# Patient Record
Sex: Female | Born: 1935 | Race: White | Hispanic: No | Smoking: Never smoker
Health system: Southern US, Community
[De-identification: ages and names within clinical notes are randomized; demographics above are authoritative.]

## PROBLEM LIST (undated history)

## (undated) DIAGNOSIS — G629 Polyneuropathy, unspecified: Secondary | ICD-10-CM

## (undated) DIAGNOSIS — J302 Other seasonal allergic rhinitis: Secondary | ICD-10-CM

## (undated) DIAGNOSIS — I10 Essential (primary) hypertension: Secondary | ICD-10-CM

## (undated) DIAGNOSIS — M5136 Other intervertebral disc degeneration, lumbar region: Secondary | ICD-10-CM

## (undated) DIAGNOSIS — R351 Nocturia: Secondary | ICD-10-CM

## (undated) DIAGNOSIS — N952 Postmenopausal atrophic vaginitis: Secondary | ICD-10-CM

## (undated) DIAGNOSIS — N816 Rectocele: Secondary | ICD-10-CM

## (undated) DIAGNOSIS — E039 Hypothyroidism, unspecified: Secondary | ICD-10-CM

## (undated) DIAGNOSIS — Z87898 Personal history of other specified conditions: Secondary | ICD-10-CM

## (undated) DIAGNOSIS — M4800 Spinal stenosis, site unspecified: Secondary | ICD-10-CM

## (undated) DIAGNOSIS — C73 Malignant neoplasm of thyroid gland: Secondary | ICD-10-CM

## (undated) DIAGNOSIS — N3941 Urge incontinence: Secondary | ICD-10-CM

## (undated) DIAGNOSIS — I429 Cardiomyopathy, unspecified: Secondary | ICD-10-CM

## (undated) DIAGNOSIS — K5900 Constipation, unspecified: Secondary | ICD-10-CM

## (undated) DIAGNOSIS — M549 Dorsalgia, unspecified: Secondary | ICD-10-CM

## (undated) DIAGNOSIS — K589 Irritable bowel syndrome without diarrhea: Secondary | ICD-10-CM

## (undated) DIAGNOSIS — M51369 Other intervertebral disc degeneration, lumbar region without mention of lumbar back pain or lower extremity pain: Secondary | ICD-10-CM

## (undated) DIAGNOSIS — J45909 Unspecified asthma, uncomplicated: Secondary | ICD-10-CM

## (undated) DIAGNOSIS — R079 Chest pain, unspecified: Secondary | ICD-10-CM

## (undated) DIAGNOSIS — M519 Unspecified thoracic, thoracolumbar and lumbosacral intervertebral disc disorder: Secondary | ICD-10-CM

## (undated) DIAGNOSIS — K219 Gastro-esophageal reflux disease without esophagitis: Secondary | ICD-10-CM

## (undated) DIAGNOSIS — IMO0001 Reserved for inherently not codable concepts without codable children: Secondary | ICD-10-CM

## (undated) DIAGNOSIS — M1990 Unspecified osteoarthritis, unspecified site: Secondary | ICD-10-CM

## (undated) DIAGNOSIS — N842 Polyp of vagina: Secondary | ICD-10-CM

## (undated) HISTORY — PX: THYROID SURGERY: SHX805

## (undated) HISTORY — DX: Urge incontinence: N39.41

## (undated) HISTORY — PX: BREAST BIOPSY: SHX20

## (undated) HISTORY — DX: Irritable bowel syndrome, unspecified: K58.9

## (undated) HISTORY — DX: Constipation, unspecified: K59.00

## (undated) HISTORY — PX: OTHER SURGICAL HISTORY: SHX169

## (undated) HISTORY — PX: HEMORROIDECTOMY: SUR656

## (undated) HISTORY — PX: PITUITARY SURGERY: SHX203

## (undated) HISTORY — PX: JOINT REPLACEMENT: SHX530

## (undated) HISTORY — PX: DILATION AND CURETTAGE OF UTERUS: SHX78

## (undated) HISTORY — PX: PAROTIDECTOMY: SUR1003

## (undated) HISTORY — DX: Other seasonal allergic rhinitis: J30.2

## (undated) HISTORY — PX: THYROIDECTOMY: SHX17

## (undated) HISTORY — DX: Postmenopausal atrophic vaginitis: N95.2

## (undated) HISTORY — DX: Rectocele: N81.6

## (undated) HISTORY — DX: Nocturia: R35.1

## (undated) HISTORY — DX: Polyp of vagina: N84.2

## (undated) HISTORY — DX: Malignant neoplasm of thyroid gland: C73

---

## 2004-02-20 ENCOUNTER — Other Ambulatory Visit: Payer: Self-pay

## 2004-07-08 ENCOUNTER — Ambulatory Visit: Payer: Self-pay | Admitting: Oncology

## 2004-07-28 ENCOUNTER — Ambulatory Visit: Payer: Self-pay | Admitting: Oncology

## 2004-11-05 ENCOUNTER — Ambulatory Visit: Payer: Self-pay | Admitting: Oncology

## 2004-11-19 ENCOUNTER — Ambulatory Visit: Payer: Self-pay | Admitting: Internal Medicine

## 2004-11-25 ENCOUNTER — Ambulatory Visit: Payer: Self-pay | Admitting: Oncology

## 2005-03-11 ENCOUNTER — Ambulatory Visit: Payer: Self-pay | Admitting: Oncology

## 2005-03-27 ENCOUNTER — Ambulatory Visit: Payer: Self-pay | Admitting: Oncology

## 2005-04-07 ENCOUNTER — Ambulatory Visit: Payer: Self-pay | Admitting: Unknown Physician Specialty

## 2005-07-07 ENCOUNTER — Ambulatory Visit: Payer: Self-pay | Admitting: Oncology

## 2005-07-28 ENCOUNTER — Ambulatory Visit: Payer: Self-pay | Admitting: Oncology

## 2005-11-04 ENCOUNTER — Ambulatory Visit: Payer: Self-pay | Admitting: Oncology

## 2005-11-22 ENCOUNTER — Ambulatory Visit: Payer: Self-pay | Admitting: Internal Medicine

## 2005-11-25 ENCOUNTER — Ambulatory Visit: Payer: Self-pay | Admitting: Oncology

## 2006-03-04 ENCOUNTER — Ambulatory Visit: Payer: Self-pay | Admitting: Oncology

## 2006-03-27 ENCOUNTER — Ambulatory Visit: Payer: Self-pay | Admitting: Oncology

## 2006-07-15 ENCOUNTER — Ambulatory Visit: Payer: Self-pay | Admitting: Oncology

## 2006-07-28 ENCOUNTER — Ambulatory Visit: Payer: Self-pay | Admitting: Oncology

## 2006-10-06 ENCOUNTER — Other Ambulatory Visit: Payer: Self-pay

## 2006-10-06 ENCOUNTER — Ambulatory Visit: Payer: Self-pay | Admitting: Unknown Physician Specialty

## 2006-10-07 ENCOUNTER — Ambulatory Visit: Payer: Self-pay | Admitting: Unknown Physician Specialty

## 2006-11-23 ENCOUNTER — Ambulatory Visit: Payer: Self-pay | Admitting: Internal Medicine

## 2006-12-27 ENCOUNTER — Ambulatory Visit: Payer: Self-pay | Admitting: Oncology

## 2007-01-11 ENCOUNTER — Ambulatory Visit: Payer: Self-pay | Admitting: Oncology

## 2007-01-26 ENCOUNTER — Ambulatory Visit: Payer: Self-pay | Admitting: Oncology

## 2007-05-11 ENCOUNTER — Ambulatory Visit: Payer: Self-pay | Admitting: Internal Medicine

## 2007-06-28 ENCOUNTER — Ambulatory Visit: Payer: Self-pay | Admitting: Oncology

## 2007-07-13 ENCOUNTER — Ambulatory Visit: Payer: Self-pay | Admitting: Oncology

## 2007-07-29 ENCOUNTER — Ambulatory Visit: Payer: Self-pay | Admitting: Oncology

## 2007-10-29 ENCOUNTER — Ambulatory Visit: Payer: Self-pay | Admitting: Oncology

## 2007-11-13 ENCOUNTER — Ambulatory Visit: Payer: Self-pay | Admitting: Internal Medicine

## 2007-11-23 ENCOUNTER — Ambulatory Visit: Payer: Self-pay | Admitting: Internal Medicine

## 2007-12-05 ENCOUNTER — Ambulatory Visit: Payer: Self-pay | Admitting: Internal Medicine

## 2007-12-27 ENCOUNTER — Ambulatory Visit: Payer: Self-pay | Admitting: Oncology

## 2008-01-15 ENCOUNTER — Ambulatory Visit: Payer: Self-pay | Admitting: Oncology

## 2008-01-26 ENCOUNTER — Ambulatory Visit: Payer: Self-pay | Admitting: Oncology

## 2008-01-28 ENCOUNTER — Ambulatory Visit: Payer: Self-pay | Admitting: Family Medicine

## 2008-06-26 ENCOUNTER — Ambulatory Visit: Payer: Self-pay | Admitting: Unknown Physician Specialty

## 2008-06-27 ENCOUNTER — Ambulatory Visit: Payer: Self-pay | Admitting: Oncology

## 2008-07-22 ENCOUNTER — Ambulatory Visit: Payer: Self-pay | Admitting: Oncology

## 2008-12-09 ENCOUNTER — Ambulatory Visit: Payer: Self-pay | Admitting: Internal Medicine

## 2008-12-26 ENCOUNTER — Ambulatory Visit: Payer: Self-pay | Admitting: Oncology

## 2009-01-20 ENCOUNTER — Ambulatory Visit: Payer: Self-pay | Admitting: Oncology

## 2009-01-25 ENCOUNTER — Ambulatory Visit: Payer: Self-pay | Admitting: Oncology

## 2009-02-20 ENCOUNTER — Ambulatory Visit: Payer: Self-pay | Admitting: Internal Medicine

## 2009-06-27 ENCOUNTER — Ambulatory Visit: Payer: Self-pay | Admitting: Oncology

## 2009-07-21 ENCOUNTER — Ambulatory Visit: Payer: Self-pay | Admitting: Oncology

## 2009-07-28 ENCOUNTER — Ambulatory Visit: Payer: Self-pay | Admitting: Oncology

## 2009-12-10 ENCOUNTER — Ambulatory Visit: Payer: Self-pay | Admitting: Internal Medicine

## 2010-01-25 ENCOUNTER — Ambulatory Visit: Payer: Self-pay | Admitting: Oncology

## 2010-01-30 ENCOUNTER — Ambulatory Visit: Payer: Self-pay | Admitting: Oncology

## 2010-02-25 ENCOUNTER — Ambulatory Visit: Payer: Self-pay | Admitting: Oncology

## 2010-08-03 ENCOUNTER — Ambulatory Visit: Payer: Self-pay | Admitting: Oncology

## 2010-08-27 ENCOUNTER — Ambulatory Visit: Payer: Self-pay | Admitting: Oncology

## 2010-12-15 ENCOUNTER — Ambulatory Visit: Payer: Self-pay | Admitting: Internal Medicine

## 2011-02-19 ENCOUNTER — Ambulatory Visit: Payer: Self-pay | Admitting: Internal Medicine

## 2011-04-01 ENCOUNTER — Ambulatory Visit: Payer: Self-pay | Admitting: Oncology

## 2011-04-28 ENCOUNTER — Ambulatory Visit: Payer: Self-pay | Admitting: Oncology

## 2011-05-29 ENCOUNTER — Ambulatory Visit: Payer: Self-pay | Admitting: Oncology

## 2011-06-01 ENCOUNTER — Ambulatory Visit: Payer: Self-pay | Admitting: Oncology

## 2011-06-14 ENCOUNTER — Ambulatory Visit: Payer: Self-pay | Admitting: Anesthesiology

## 2011-06-15 ENCOUNTER — Ambulatory Visit: Payer: Self-pay | Admitting: Unknown Physician Specialty

## 2011-06-16 LAB — PATHOLOGY REPORT

## 2011-06-28 ENCOUNTER — Ambulatory Visit: Payer: Self-pay | Admitting: Oncology

## 2011-08-05 ENCOUNTER — Ambulatory Visit: Payer: Self-pay | Admitting: Oncology

## 2011-08-28 ENCOUNTER — Ambulatory Visit: Payer: Self-pay | Admitting: Oncology

## 2011-09-13 ENCOUNTER — Ambulatory Visit: Payer: Self-pay | Admitting: Oncology

## 2011-09-17 ENCOUNTER — Ambulatory Visit: Payer: Self-pay | Admitting: Oncology

## 2011-09-28 ENCOUNTER — Ambulatory Visit: Payer: Self-pay | Admitting: Oncology

## 2011-10-11 LAB — COMPREHENSIVE METABOLIC PANEL
Albumin: 3.3 g/dL — ABNORMAL LOW (ref 3.4–5.0)
Alkaline Phosphatase: 116 U/L (ref 50–136)
Anion Gap: 9 (ref 7–16)
BUN: 9 mg/dL (ref 7–18)
Bilirubin,Total: 0.3 mg/dL (ref 0.2–1.0)
Calcium, Total: 8.9 mg/dL (ref 8.5–10.1)
Chloride: 98 mmol/L (ref 98–107)
Co2: 29 mmol/L (ref 21–32)
Creatinine: 0.89 mg/dL (ref 0.60–1.30)
EGFR (African American): >60
EGFR (Non-African Amer.): >60
Glucose: 81 mg/dL (ref 65–99)
Osmolality: 270 (ref 275–301)
Potassium: 3.7 mmol/L (ref 3.5–5.1)
SGOT(AST): 25 U/L (ref 15–37)
SGPT (ALT): 17 U/L
Sodium: 136 mmol/L (ref 136–145)
Total Protein: 7.4 g/dL (ref 6.4–8.2)

## 2011-10-11 LAB — CBC CANCER CENTER
Basophil #: 0 x10 3/mm (ref 0.0–0.1)
Basophil %: 0.4 %
Eosinophil #: 0.2 x10 3/mm (ref 0.0–0.7)
Eosinophil %: 3.2 %
HCT: 32.3 % — ABNORMAL LOW (ref 35.0–47.0)
HGB: 11.3 g/dL — ABNORMAL LOW (ref 12.0–16.0)
Lymphocyte #: 0.8 x10 3/mm — ABNORMAL LOW (ref 1.0–3.6)
Lymphocyte %: 13.3 %
MCH: 33.4 pg (ref 26.0–34.0)
MCHC: 35.1 g/dL (ref 32.0–36.0)
MCV: 95 fL (ref 80–100)
Monocyte #: 0.7 x10 3/mm (ref 0.0–0.7)
Monocyte %: 11.2 %
Neutrophil #: 4.3 x10 3/mm (ref 1.4–6.5)
Neutrophil %: 71.9 %
Platelet: 233 x10 3/mm (ref 150–440)
RBC: 3.39 10*6/uL — ABNORMAL LOW (ref 3.80–5.20)
RDW: 13.9 % (ref 11.5–14.5)
WBC: 6 x10 3/mm (ref 3.6–11.0)

## 2011-10-11 LAB — TSH: Thyroid Stimulating Horm: 0.355 u[IU]/mL — ABNORMAL LOW

## 2011-10-29 ENCOUNTER — Ambulatory Visit: Payer: Self-pay | Admitting: Oncology

## 2011-11-11 LAB — TSH: Thyroid Stimulating Horm: 0.074 u[IU]/mL — ABNORMAL LOW

## 2011-11-26 ENCOUNTER — Ambulatory Visit: Payer: Self-pay | Admitting: Oncology

## 2011-12-21 ENCOUNTER — Ambulatory Visit: Payer: Self-pay | Admitting: Oncology

## 2011-12-23 LAB — CBC CANCER CENTER
Basophil %: 1.7 %
Eosinophil #: 0.6 x10 3/mm (ref 0.0–0.7)
Eosinophil %: 11.5 %
HGB: 12.3 g/dL (ref 12.0–16.0)
Lymphocyte #: 0.7 x10 3/mm — ABNORMAL LOW (ref 1.0–3.6)
MCH: 31.1 pg (ref 26.0–34.0)
MCHC: 34.4 g/dL (ref 32.0–36.0)
MCV: 91 fL (ref 80–100)
Monocyte #: 0.7 x10 3/mm (ref 0.0–0.7)
Monocyte %: 12.5 %
Neutrophil #: 3.2 x10 3/mm (ref 1.4–6.5)
Neutrophil %: 60.1 %
Platelet: 246 x10 3/mm (ref 150–440)
WBC: 5.3 x10 3/mm (ref 3.6–11.0)

## 2011-12-23 LAB — COMPREHENSIVE METABOLIC PANEL
Albumin: 3.7 g/dL (ref 3.4–5.0)
Alkaline Phosphatase: 97 U/L (ref 50–136)
Anion Gap: 7 (ref 7–16)
BUN: 10 mg/dL (ref 7–18)
Bilirubin,Total: 0.3 mg/dL (ref 0.2–1.0)
Calcium, Total: 9 mg/dL (ref 8.5–10.1)
EGFR (African American): >60
EGFR (Non-African Amer.): >60
Glucose: 82 mg/dL (ref 65–99)
Osmolality: 265 (ref 275–301)
Potassium: 3.9 mmol/L (ref 3.5–5.1)
SGOT(AST): 31 U/L (ref 15–37)

## 2011-12-27 ENCOUNTER — Ambulatory Visit: Payer: Self-pay | Admitting: Oncology

## 2012-01-20 ENCOUNTER — Ambulatory Visit: Payer: Self-pay | Admitting: Internal Medicine

## 2012-03-27 ENCOUNTER — Ambulatory Visit: Payer: Self-pay | Admitting: Oncology

## 2012-03-27 LAB — CBC CANCER CENTER
Basophil #: 0 x10 3/mm (ref 0.0–0.1)
Basophil %: 0.5 %
Eosinophil #: 0.8 x10 3/mm — ABNORMAL HIGH (ref 0.0–0.7)
HCT: 39.2 % (ref 35.0–47.0)
HGB: 12.8 g/dL (ref 12.0–16.0)
Lymphocyte %: 10.9 %
MCH: 29.4 pg (ref 26.0–34.0)
MCHC: 32.8 g/dL (ref 32.0–36.0)
MCV: 90 fL (ref 80–100)
Monocyte #: 0.9 x10 3/mm (ref 0.2–0.9)
Neutrophil #: 4.2 x10 3/mm (ref 1.4–6.5)
Neutrophil %: 63.5 %
RBC: 4.37 10*6/uL (ref 3.80–5.20)
RDW: 13.7 % (ref 11.5–14.5)
WBC: 6.7 x10 3/mm (ref 3.6–11.0)

## 2012-03-27 LAB — COMPREHENSIVE METABOLIC PANEL
Anion Gap: 8 (ref 7–16)
Bilirubin,Total: 0.3 mg/dL (ref 0.2–1.0)
Chloride: 97 mmol/L — ABNORMAL LOW (ref 98–107)
Co2: 29 mmol/L (ref 21–32)
Creatinine: 0.77 mg/dL (ref 0.60–1.30)
EGFR (African American): >60
Osmolality: 266 (ref 275–301)
SGOT(AST): 26 U/L (ref 15–37)
Sodium: 134 mmol/L — ABNORMAL LOW (ref 136–145)

## 2012-04-27 ENCOUNTER — Ambulatory Visit: Payer: Self-pay | Admitting: Oncology

## 2012-06-27 ENCOUNTER — Ambulatory Visit: Payer: Self-pay | Admitting: Oncology

## 2012-06-29 ENCOUNTER — Ambulatory Visit: Payer: Self-pay | Admitting: Oncology

## 2012-06-29 LAB — CBC CANCER CENTER
Basophil #: 0 x10 3/mm (ref 0.0–0.1)
Basophil %: 0.4 %
HCT: 37.4 % (ref 35.0–47.0)
HGB: 12.4 g/dL (ref 12.0–16.0)
Lymphocyte %: 14.8 %
MCH: 29.8 pg (ref 26.0–34.0)
MCHC: 33 g/dL (ref 32.0–36.0)
Neutrophil %: 65.3 %
Platelet: 309 x10 3/mm (ref 150–440)
RDW: 13.3 % (ref 11.5–14.5)

## 2012-06-29 LAB — COMPREHENSIVE METABOLIC PANEL
BUN: 7 mg/dL (ref 7–18)
Calcium, Total: 9 mg/dL (ref 8.5–10.1)
Chloride: 100 mmol/L (ref 98–107)
EGFR (African American): >60
EGFR (Non-African Amer.): >60
Potassium: 4.5 mmol/L (ref 3.5–5.1)
SGOT(AST): 28 U/L (ref 15–37)
Total Protein: 7.3 g/dL (ref 6.4–8.2)

## 2012-07-28 ENCOUNTER — Ambulatory Visit: Payer: Self-pay | Admitting: Oncology

## 2012-12-26 ENCOUNTER — Ambulatory Visit: Payer: Self-pay | Admitting: Oncology

## 2013-01-11 LAB — CBC CANCER CENTER
Basophil %: 0.7 %
Eosinophil #: 1.1 x10 3/mm — ABNORMAL HIGH (ref 0.0–0.7)
HCT: 38.6 % (ref 35.0–47.0)
HGB: 12.9 g/dL (ref 12.0–16.0)
Lymphocyte %: 13.7 %
MCH: 29.7 pg (ref 26.0–34.0)
MCV: 89 fL (ref 80–100)
Monocyte #: 0.7 x10 3/mm (ref 0.2–0.9)
Monocyte %: 9.5 %
Neutrophil #: 4.5 x10 3/mm (ref 1.4–6.5)
Neutrophil %: 60.7 %
Platelet: 230 x10 3/mm (ref 150–440)
RBC: 4.36 10*6/uL (ref 3.80–5.20)
WBC: 7.3 x10 3/mm (ref 3.6–11.0)

## 2013-01-11 LAB — COMPREHENSIVE METABOLIC PANEL
Alkaline Phosphatase: 116 U/L (ref 50–136)
Bilirubin,Total: 0.3 mg/dL (ref 0.2–1.0)
Calcium, Total: 8.5 mg/dL (ref 8.5–10.1)
Chloride: 99 mmol/L (ref 98–107)
Creatinine: 0.82 mg/dL (ref 0.60–1.30)
EGFR (African American): >60
EGFR (Non-African Amer.): >60
Osmolality: 263 (ref 275–301)
Potassium: 4.3 mmol/L (ref 3.5–5.1)
SGOT(AST): 38 U/L — ABNORMAL HIGH (ref 15–37)
Sodium: 132 mmol/L — ABNORMAL LOW (ref 136–145)
Total Protein: 7.6 g/dL (ref 6.4–8.2)

## 2013-01-25 ENCOUNTER — Ambulatory Visit: Payer: Self-pay | Admitting: Oncology

## 2013-02-07 ENCOUNTER — Ambulatory Visit: Payer: Self-pay | Admitting: Internal Medicine

## 2013-06-21 ENCOUNTER — Ambulatory Visit: Payer: Self-pay | Admitting: Unknown Physician Specialty

## 2013-07-12 ENCOUNTER — Ambulatory Visit: Payer: Self-pay | Admitting: Oncology

## 2013-07-12 LAB — CBC CANCER CENTER
Basophil #: 0 x10 3/mm (ref 0.0–0.1)
Basophil %: 0.7 %
Eosinophil #: 1.6 x10 3/mm — ABNORMAL HIGH (ref 0.0–0.7)
HCT: 38.3 % (ref 35.0–47.0)
HGB: 13 g/dL (ref 12.0–16.0)
Lymphocyte #: 1 x10 3/mm (ref 1.0–3.6)
Lymphocyte %: 13.2 %
MCH: 30.8 pg (ref 26.0–34.0)
MCV: 91 fL (ref 80–100)
Monocyte #: 0.9 x10 3/mm (ref 0.2–0.9)
Monocyte %: 11.6 %
Neutrophil #: 4 x10 3/mm (ref 1.4–6.5)
Platelet: 265 x10 3/mm (ref 150–440)
RBC: 4.21 10*6/uL (ref 3.80–5.20)
RDW: 13 % (ref 11.5–14.5)
WBC: 7.4 x10 3/mm (ref 3.6–11.0)

## 2013-07-12 LAB — COMPREHENSIVE METABOLIC PANEL
Albumin: 3.4 g/dL (ref 3.4–5.0)
Alkaline Phosphatase: 124 U/L (ref 50–136)
Anion Gap: 5 — ABNORMAL LOW (ref 7–16)
BUN: 11 mg/dL (ref 7–18)
Bilirubin,Total: 0.3 mg/dL (ref 0.2–1.0)
Chloride: 97 mmol/L — ABNORMAL LOW (ref 98–107)
Co2: 31 mmol/L (ref 21–32)
Creatinine: 0.85 mg/dL (ref 0.60–1.30)
EGFR (Non-African Amer.): >60
Osmolality: 265 (ref 275–301)
Potassium: 4.5 mmol/L (ref 3.5–5.1)
SGOT(AST): 29 U/L (ref 15–37)
Total Protein: 7.2 g/dL (ref 6.4–8.2)

## 2013-07-28 ENCOUNTER — Ambulatory Visit: Payer: Self-pay | Admitting: Oncology

## 2013-08-27 ENCOUNTER — Ambulatory Visit: Payer: Self-pay | Admitting: Oncology

## 2013-10-23 ENCOUNTER — Ambulatory Visit: Payer: Self-pay | Admitting: Obstetrics and Gynecology

## 2013-12-05 ENCOUNTER — Ambulatory Visit: Payer: Self-pay | Admitting: Oncology

## 2013-12-05 LAB — CREATININE, SERUM
Creatinine: 0.83 mg/dL (ref 0.60–1.30)
EGFR (Non-African Amer.): >60

## 2013-12-14 LAB — CBC CANCER CENTER
Basophil #: 0.1 x10 3/mm (ref 0.0–0.1)
Basophil %: 1.9 %
EOS ABS: 0.6 x10 3/mm (ref 0.0–0.7)
Eosinophil %: 7.8 %
HCT: 37.5 % (ref 35.0–47.0)
HGB: 12.6 g/dL (ref 12.0–16.0)
Lymphocyte #: 0.9 x10 3/mm — ABNORMAL LOW (ref 1.0–3.6)
Lymphocyte %: 11.3 %
MCH: 29.8 pg (ref 26.0–34.0)
MCHC: 33.6 g/dL (ref 32.0–36.0)
MCV: 89 fL (ref 80–100)
MONOS PCT: 10.7 %
Monocyte #: 0.8 x10 3/mm (ref 0.2–0.9)
NEUTROS ABS: 5.3 x10 3/mm (ref 1.4–6.5)
Neutrophil %: 68.3 %
PLATELETS: 253 x10 3/mm (ref 150–440)
RBC: 4.23 10*6/uL (ref 3.80–5.20)
RDW: 13.1 % (ref 11.5–14.5)
WBC: 7.7 x10 3/mm (ref 3.6–11.0)

## 2013-12-14 LAB — COMPREHENSIVE METABOLIC PANEL
ALBUMIN: 3.4 g/dL (ref 3.4–5.0)
ALT: 30 U/L (ref 12–78)
ANION GAP: 7 (ref 7–16)
Alkaline Phosphatase: 89 U/L
BILIRUBIN TOTAL: 0.3 mg/dL (ref 0.2–1.0)
BUN: 16 mg/dL (ref 7–18)
CALCIUM: 8.7 mg/dL (ref 8.5–10.1)
CO2: 29 mmol/L (ref 21–32)
Chloride: 97 mmol/L — ABNORMAL LOW (ref 98–107)
Creatinine: 0.9 mg/dL (ref 0.60–1.30)
EGFR (Non-African Amer.): >60
Glucose: 85 mg/dL (ref 65–99)
Osmolality: 267 (ref 275–301)
POTASSIUM: 4.6 mmol/L (ref 3.5–5.1)
SGOT(AST): 35 U/L (ref 15–37)
Sodium: 133 mmol/L — ABNORMAL LOW (ref 136–145)
Total Protein: 7.2 g/dL (ref 6.4–8.2)

## 2013-12-14 LAB — TSH: Thyroid Stimulating Horm: 0.183 u[IU]/mL — ABNORMAL LOW

## 2013-12-19 ENCOUNTER — Ambulatory Visit: Payer: Self-pay | Admitting: Oncology

## 2013-12-26 ENCOUNTER — Ambulatory Visit: Payer: Self-pay | Admitting: Oncology

## 2014-01-31 ENCOUNTER — Ambulatory Visit: Payer: Self-pay | Admitting: Oncology

## 2014-02-01 LAB — CBC CANCER CENTER
BASOS PCT: 1.1 %
Basophil #: 0.1 x10 3/mm (ref 0.0–0.1)
EOS PCT: 10.4 %
Eosinophil #: 0.7 x10 3/mm (ref 0.0–0.7)
HCT: 36 % (ref 35.0–47.0)
HGB: 12.1 g/dL (ref 12.0–16.0)
LYMPHS ABS: 1.1 x10 3/mm (ref 1.0–3.6)
Lymphocyte %: 17.1 %
MCH: 29.8 pg (ref 26.0–34.0)
MCHC: 33.5 g/dL (ref 32.0–36.0)
MCV: 89 fL (ref 80–100)
MONOS PCT: 12 %
Monocyte #: 0.8 x10 3/mm (ref 0.2–0.9)
NEUTROS PCT: 59.4 %
Neutrophil #: 3.8 x10 3/mm (ref 1.4–6.5)
PLATELETS: 262 x10 3/mm (ref 150–440)
RBC: 4.05 10*6/uL (ref 3.80–5.20)
RDW: 13.5 % (ref 11.5–14.5)
WBC: 6.3 x10 3/mm (ref 3.6–11.0)

## 2014-02-01 LAB — COMPREHENSIVE METABOLIC PANEL
ALBUMIN: 3.6 g/dL (ref 3.4–5.0)
ALK PHOS: 97 U/L
ALT: 31 U/L (ref 12–78)
Anion Gap: 5 — ABNORMAL LOW (ref 7–16)
BUN: 14 mg/dL (ref 7–18)
Bilirubin,Total: 0.3 mg/dL (ref 0.2–1.0)
CHLORIDE: 94 mmol/L — AB (ref 98–107)
Calcium, Total: 8.8 mg/dL (ref 8.5–10.1)
Co2: 31 mmol/L (ref 21–32)
Creatinine: 0.82 mg/dL (ref 0.60–1.30)
EGFR (African American): >60
GLUCOSE: 85 mg/dL (ref 65–99)
Osmolality: 261 (ref 275–301)
Potassium: 4.7 mmol/L (ref 3.5–5.1)
SGOT(AST): 32 U/L (ref 15–37)
Sodium: 130 mmol/L — ABNORMAL LOW (ref 136–145)
Total Protein: 7 g/dL (ref 6.4–8.2)

## 2014-02-01 LAB — TSH: Thyroid Stimulating Horm: 0.205 u[IU]/mL — ABNORMAL LOW

## 2014-02-06 ENCOUNTER — Ambulatory Visit: Payer: Self-pay | Admitting: Anesthesiology

## 2014-02-12 ENCOUNTER — Ambulatory Visit: Payer: Self-pay | Admitting: Anesthesiology

## 2014-02-25 ENCOUNTER — Ambulatory Visit: Payer: Self-pay | Admitting: Oncology

## 2014-03-04 ENCOUNTER — Ambulatory Visit: Payer: Self-pay | Admitting: Anesthesiology

## 2014-03-22 ENCOUNTER — Ambulatory Visit: Payer: Self-pay | Admitting: Anesthesiology

## 2014-04-02 ENCOUNTER — Ambulatory Visit: Payer: Self-pay | Admitting: Anesthesiology

## 2014-05-02 ENCOUNTER — Ambulatory Visit: Payer: Self-pay | Admitting: Anesthesiology

## 2014-05-24 ENCOUNTER — Ambulatory Visit: Payer: Self-pay | Admitting: Oncology

## 2014-05-27 LAB — CBC CANCER CENTER
BASOS PCT: 0.7 %
Basophil #: 0 x10 3/mm (ref 0.0–0.1)
EOS ABS: 0.2 x10 3/mm (ref 0.0–0.7)
EOS PCT: 2.4 %
HCT: 35.1 % (ref 35.0–47.0)
HGB: 11.7 g/dL — ABNORMAL LOW (ref 12.0–16.0)
Lymphocyte #: 0.7 x10 3/mm — ABNORMAL LOW (ref 1.0–3.6)
Lymphocyte %: 9.6 %
MCH: 29.8 pg (ref 26.0–34.0)
MCHC: 33.3 g/dL (ref 32.0–36.0)
MCV: 89 fL (ref 80–100)
MONOS PCT: 11 %
Monocyte #: 0.8 x10 3/mm (ref 0.2–0.9)
NEUTROS ABS: 5.7 x10 3/mm (ref 1.4–6.5)
Neutrophil %: 76.3 %
Platelet: 288 x10 3/mm (ref 150–440)
RBC: 3.93 10*6/uL (ref 3.80–5.20)
RDW: 14.2 % (ref 11.5–14.5)
WBC: 7.4 x10 3/mm (ref 3.6–11.0)

## 2014-05-27 LAB — COMPREHENSIVE METABOLIC PANEL
ALK PHOS: 175 U/L — AB
ALT: 28 U/L
ANION GAP: 9 (ref 7–16)
AST: 34 U/L (ref 15–37)
Albumin: 3 g/dL — ABNORMAL LOW (ref 3.4–5.0)
BUN: 12 mg/dL (ref 7–18)
Bilirubin,Total: 0.3 mg/dL (ref 0.2–1.0)
CO2: 29 mmol/L (ref 21–32)
Calcium, Total: 8.6 mg/dL (ref 8.5–10.1)
Chloride: 95 mmol/L — ABNORMAL LOW (ref 98–107)
Creatinine: 0.79 mg/dL (ref 0.60–1.30)
EGFR (Non-African Amer.): >60
Glucose: 85 mg/dL (ref 65–99)
OSMOLALITY: 265 (ref 275–301)
POTASSIUM: 4.8 mmol/L (ref 3.5–5.1)
Sodium: 133 mmol/L — ABNORMAL LOW (ref 136–145)
TOTAL PROTEIN: 7.1 g/dL (ref 6.4–8.2)

## 2014-05-27 LAB — TSH: Thyroid Stimulating Horm: 0.173 u[IU]/mL — ABNORMAL LOW

## 2014-05-28 ENCOUNTER — Ambulatory Visit: Payer: Self-pay | Admitting: Oncology

## 2014-05-29 LAB — PROT IMMUNOELECTROPHORES(ARMC)

## 2014-05-29 LAB — KAPPA/LAMBDA FREE LIGHT CHAINS (ARMC)

## 2014-05-30 ENCOUNTER — Ambulatory Visit: Payer: Self-pay | Admitting: Anesthesiology

## 2014-06-27 ENCOUNTER — Ambulatory Visit: Payer: Self-pay | Admitting: Oncology

## 2014-07-01 ENCOUNTER — Ambulatory Visit: Payer: Self-pay | Admitting: Oncology

## 2014-07-05 LAB — CBC CANCER CENTER
BASOS PCT: 0.6 %
Basophil #: 0.1 x10 3/mm (ref 0.0–0.1)
EOS PCT: 1.9 %
Eosinophil #: 0.2 x10 3/mm (ref 0.0–0.7)
HCT: 35.8 % (ref 35.0–47.0)
HGB: 11.8 g/dL — ABNORMAL LOW (ref 12.0–16.0)
Lymphocyte #: 0.8 x10 3/mm — ABNORMAL LOW (ref 1.0–3.6)
Lymphocyte %: 8 %
MCH: 29.1 pg (ref 26.0–34.0)
MCHC: 32.9 g/dL (ref 32.0–36.0)
MCV: 89 fL (ref 80–100)
Monocyte #: 1.2 x10 3/mm — ABNORMAL HIGH (ref 0.2–0.9)
Monocyte %: 11.9 %
NEUTROS PCT: 77.6 %
Neutrophil #: 7.9 x10 3/mm — ABNORMAL HIGH (ref 1.4–6.5)
Platelet: 298 x10 3/mm (ref 150–440)
RBC: 4.05 10*6/uL (ref 3.80–5.20)
RDW: 13.8 % (ref 11.5–14.5)
WBC: 10.2 x10 3/mm (ref 3.6–11.0)

## 2014-07-05 LAB — COMPREHENSIVE METABOLIC PANEL
ALBUMIN: 3.1 g/dL — AB (ref 3.4–5.0)
ANION GAP: 5 — AB (ref 7–16)
AST: 29 U/L (ref 15–37)
Alkaline Phosphatase: 121 U/L — ABNORMAL HIGH
BUN: 13 mg/dL (ref 7–18)
Bilirubin,Total: 0.3 mg/dL (ref 0.2–1.0)
CHLORIDE: 93 mmol/L — AB (ref 98–107)
Calcium, Total: 8.7 mg/dL (ref 8.5–10.1)
Co2: 32 mmol/L (ref 21–32)
Creatinine: 0.86 mg/dL (ref 0.60–1.30)
EGFR (Non-African Amer.): >60
GLUCOSE: 83 mg/dL (ref 65–99)
OSMOLALITY: 260 (ref 275–301)
POTASSIUM: 4.1 mmol/L (ref 3.5–5.1)
SGPT (ALT): 25 U/L
Sodium: 130 mmol/L — ABNORMAL LOW (ref 136–145)
TOTAL PROTEIN: 6.8 g/dL (ref 6.4–8.2)

## 2014-07-05 LAB — TSH: Thyroid Stimulating Horm: 0.148 u[IU]/mL — ABNORMAL LOW

## 2014-07-11 ENCOUNTER — Ambulatory Visit: Payer: Self-pay | Admitting: Internal Medicine

## 2014-07-15 ENCOUNTER — Encounter: Payer: Self-pay | Admitting: Oncology

## 2014-07-28 ENCOUNTER — Encounter: Payer: Self-pay | Admitting: Oncology

## 2014-07-28 ENCOUNTER — Ambulatory Visit: Payer: Self-pay | Admitting: Oncology

## 2014-08-28 ENCOUNTER — Ambulatory Visit: Payer: Self-pay | Admitting: Anesthesiology

## 2014-09-18 ENCOUNTER — Ambulatory Visit: Payer: Self-pay | Admitting: Internal Medicine

## 2014-10-14 DIAGNOSIS — I428 Other cardiomyopathies: Secondary | ICD-10-CM | POA: Insufficient documentation

## 2014-10-14 DIAGNOSIS — I429 Cardiomyopathy, unspecified: Secondary | ICD-10-CM | POA: Insufficient documentation

## 2014-10-17 DIAGNOSIS — E782 Mixed hyperlipidemia: Secondary | ICD-10-CM | POA: Insufficient documentation

## 2014-10-31 ENCOUNTER — Ambulatory Visit: Payer: Self-pay | Admitting: Oncology

## 2014-10-31 LAB — COMPREHENSIVE METABOLIC PANEL
ANION GAP: 8 (ref 7–16)
AST: 20 U/L (ref 15–37)
Albumin: 2.8 g/dL — ABNORMAL LOW (ref 3.4–5.0)
Alkaline Phosphatase: 127 U/L — ABNORMAL HIGH (ref 46–116)
BUN: 19 mg/dL — ABNORMAL HIGH (ref 7–18)
Bilirubin,Total: 0.2 mg/dL (ref 0.2–1.0)
CALCIUM: 8.3 mg/dL — AB (ref 8.5–10.1)
CHLORIDE: 96 mmol/L — AB (ref 98–107)
CO2: 29 mmol/L (ref 21–32)
Creatinine: 0.94 mg/dL (ref 0.60–1.30)
Glucose: 132 mg/dL — ABNORMAL HIGH (ref 65–99)
Osmolality: 270 (ref 275–301)
POTASSIUM: 4.4 mmol/L (ref 3.5–5.1)
SGPT (ALT): 19 U/L (ref 14–63)
Sodium: 133 mmol/L — ABNORMAL LOW (ref 136–145)
Total Protein: 7.3 g/dL (ref 6.4–8.2)

## 2014-10-31 LAB — CBC CANCER CENTER
BASOS PCT: 0.8 %
Basophil #: 0.1 x10 3/mm (ref 0.0–0.1)
Eosinophil #: 1.1 x10 3/mm — ABNORMAL HIGH (ref 0.0–0.7)
Eosinophil %: 11 %
HCT: 33.3 % — ABNORMAL LOW (ref 35.0–47.0)
HGB: 11 g/dL — AB (ref 12.0–16.0)
LYMPHS PCT: 8 %
Lymphocyte #: 0.8 x10 3/mm — ABNORMAL LOW (ref 1.0–3.6)
MCH: 27.7 pg (ref 26.0–34.0)
MCHC: 32.9 g/dL (ref 32.0–36.0)
MCV: 84 fL (ref 80–100)
Monocyte #: 0.9 x10 3/mm (ref 0.2–0.9)
Monocyte %: 9.4 %
NEUTROS ABS: 6.9 x10 3/mm — AB (ref 1.4–6.5)
Neutrophil %: 70.8 %
Platelet: 410 x10 3/mm (ref 150–440)
RBC: 3.96 10*6/uL (ref 3.80–5.20)
RDW: 13.7 % (ref 11.5–14.5)
WBC: 9.7 x10 3/mm (ref 3.6–11.0)

## 2014-10-31 LAB — TSH: Thyroid Stimulating Horm: 0.081 u[IU]/mL — ABNORMAL LOW

## 2014-11-06 ENCOUNTER — Ambulatory Visit: Payer: Self-pay | Admitting: Rheumatology

## 2014-11-26 ENCOUNTER — Ambulatory Visit
Admit: 2014-11-26 | Disposition: A | Discharge status: Home / Self Care | Payer: Self-pay | Attending: Oncology | Admitting: Oncology

## 2014-12-23 ENCOUNTER — Ambulatory Visit: Admit: 2014-12-23 | Disposition: A | Discharge status: Home / Self Care | Payer: Self-pay | Admitting: Oncology

## 2014-12-25 LAB — URINALYSIS, COMPLETE
Bacteria: NONE SEEN
Bilirubin,UR: NEGATIVE
GLUCOSE, UR: NEGATIVE mg/dL (ref 0–75)
Ketone: NEGATIVE
NITRITE: NEGATIVE
Ph: 7 (ref 4.5–8.0)
Protein: 100
RBC,UR: 236 /HPF (ref 0–5)
Specific Gravity: 1.01 (ref 1.003–1.030)
Squamous Epithelial: 5
WBC UR: 2277 /HPF (ref 0–5)

## 2014-12-25 LAB — CBC CANCER CENTER
BASOS PCT: 0.2 %
Basophil #: 0 x10 3/mm (ref 0.0–0.1)
EOS PCT: 1.9 %
Eosinophil #: 0.3 x10 3/mm (ref 0.0–0.7)
HCT: 31.6 % — ABNORMAL LOW (ref 35.0–47.0)
HGB: 10.4 g/dL — ABNORMAL LOW (ref 12.0–16.0)
LYMPHS ABS: 0.9 x10 3/mm — AB (ref 1.0–3.6)
Lymphocyte %: 5.8 %
MCH: 26.7 pg (ref 26.0–34.0)
MCHC: 32.9 g/dL (ref 32.0–36.0)
MCV: 81 fL (ref 80–100)
MONO ABS: 1.6 x10 3/mm — AB (ref 0.2–0.9)
Monocyte %: 10.3 %
Neutrophil #: 13.1 x10 3/mm — ABNORMAL HIGH (ref 1.4–6.5)
Neutrophil %: 81.8 %
PLATELETS: 374 x10 3/mm (ref 150–440)
RBC: 3.89 10*6/uL (ref 3.80–5.20)
RDW: 15.3 % — ABNORMAL HIGH (ref 11.5–14.5)
WBC: 16 x10 3/mm — ABNORMAL HIGH (ref 3.6–11.0)

## 2014-12-25 LAB — IRON AND TIBC
Iron Bind.Cap.(Total): 298 (ref 250–450)
Iron Saturation: 7
Iron: 21 ug/dL — ABNORMAL LOW
UNBOUND IRON-BIND. CAP.: 276.9

## 2014-12-25 LAB — COMPREHENSIVE METABOLIC PANEL
ALT: 14 U/L
AST: 27 U/L
Albumin: 3.2 g/dL — ABNORMAL LOW
Alkaline Phosphatase: 121 U/L
Anion Gap: 10 (ref 7–16)
BUN: 13 mg/dL
Bilirubin,Total: 0.5 mg/dL
CALCIUM: 8.4 mg/dL — AB
Chloride: 94 mmol/L — ABNORMAL LOW
Co2: 28 mmol/L
Creatinine: 0.81 mg/dL
Glucose: 123 mg/dL — ABNORMAL HIGH
Potassium: 4.2 mmol/L
Sodium: 132 mmol/L — ABNORMAL LOW
TOTAL PROTEIN: 7.4 g/dL

## 2014-12-25 LAB — FERRITIN: FERRITIN (ARMC): 47 ng/mL

## 2014-12-25 LAB — TSH: Thyroid Stimulating Horm: 0.099 u[IU]/mL — ABNORMAL LOW

## 2014-12-26 LAB — URINE CULTURE

## 2014-12-27 ENCOUNTER — Ambulatory Visit
Admit: 2014-12-27 | Disposition: A | Discharge status: Home / Self Care | Payer: Self-pay | Attending: Oncology | Admitting: Oncology

## 2015-01-02 ENCOUNTER — Inpatient Hospital Stay
Admit: 2015-01-02 | Disposition: A | Discharge status: Home / Self Care | Payer: Self-pay | Attending: Internal Medicine | Admitting: Internal Medicine

## 2015-01-02 LAB — URINALYSIS, COMPLETE
BILIRUBIN, UR: NEGATIVE
Bacteria: NONE SEEN
Blood: NEGATIVE
Glucose,UR: NEGATIVE mg/dL (ref 0–75)
Ketone: NEGATIVE
Nitrite: NEGATIVE
PH: 6 (ref 4.5–8.0)
Protein: NEGATIVE
Specific Gravity: 1.011 (ref 1.003–1.030)

## 2015-01-02 LAB — BASIC METABOLIC PANEL
Anion Gap: 10 (ref 7–16)
BUN: 20 mg/dL
CREATININE: 0.85 mg/dL
Calcium, Total: 8.3 mg/dL — ABNORMAL LOW
Chloride: 90 mmol/L — ABNORMAL LOW
Co2: 29 mmol/L
EGFR (African American): >60
GLUCOSE: 191 mg/dL — AB
Potassium: 3.1 mmol/L — ABNORMAL LOW
Sodium: 129 mmol/L — ABNORMAL LOW

## 2015-01-02 LAB — CBC
HCT: 29.7 % — AB (ref 35.0–47.0)
HGB: 9.8 g/dL — ABNORMAL LOW (ref 12.0–16.0)
MCH: 26.8 pg (ref 26.0–34.0)
MCHC: 33 g/dL (ref 32.0–36.0)
MCV: 81 fL (ref 80–100)
Platelet: 345 10*3/uL (ref 150–440)
RBC: 3.66 10*6/uL — ABNORMAL LOW (ref 3.80–5.20)
RDW: 15.2 % — AB (ref 11.5–14.5)
WBC: 13 10*3/uL — AB (ref 3.6–11.0)

## 2015-01-02 LAB — TROPONIN I: Troponin-I: <0.03 ng/mL

## 2015-01-03 LAB — BASIC METABOLIC PANEL
Anion Gap: 7 (ref 7–16)
BUN: 14 mg/dL
CHLORIDE: 100 mmol/L — AB
Calcium, Total: 7.8 mg/dL — ABNORMAL LOW
Co2: 28 mmol/L
Creatinine: 0.71 mg/dL
EGFR (African American): >60
EGFR (Non-African Amer.): >60
GLUCOSE: 93 mg/dL
POTASSIUM: 3.2 mmol/L — AB
SODIUM: 135 mmol/L

## 2015-01-03 LAB — CBC WITH DIFFERENTIAL/PLATELET
Basophil #: 0 10*3/uL (ref 0.0–0.1)
Basophil %: 0.2 %
EOS ABS: 0.4 10*3/uL (ref 0.0–0.7)
EOS PCT: 2.6 %
HCT: 27.8 % — AB (ref 35.0–47.0)
HGB: 8.8 g/dL — ABNORMAL LOW (ref 12.0–16.0)
Lymphocyte #: 1 10*3/uL (ref 1.0–3.6)
Lymphocyte %: 7.3 %
MCH: 26 pg (ref 26.0–34.0)
MCHC: 31.7 g/dL — AB (ref 32.0–36.0)
MCV: 82 fL (ref 80–100)
Monocyte #: 1.5 x10 3/mm — ABNORMAL HIGH (ref 0.2–0.9)
Monocyte %: 10.9 %
NEUTROS ABS: 11 10*3/uL — AB (ref 1.4–6.5)
Neutrophil %: 79 %
Platelet: 321 10*3/uL (ref 150–440)
RBC: 3.4 10*6/uL — AB (ref 3.80–5.20)
RDW: 15.3 % — ABNORMAL HIGH (ref 11.5–14.5)
WBC: 13.9 10*3/uL — ABNORMAL HIGH (ref 3.6–11.0)

## 2015-01-03 LAB — HEPATIC FUNCTION PANEL A (ARMC)
Albumin: 3.1 g/dL — ABNORMAL LOW
Alkaline Phosphatase: 104 U/L
Bilirubin, Direct: <0.1 mg/dL
Bilirubin,Total: 0.4 mg/dL
SGOT(AST): 34 U/L
SGPT (ALT): 20 U/L
Total Protein: 7.1 g/dL

## 2015-01-03 LAB — TSH: THYROID STIMULATING HORM: 0.132 u[IU]/mL — AB

## 2015-01-04 LAB — BASIC METABOLIC PANEL
ANION GAP: 6 — AB (ref 7–16)
BUN: 13 mg/dL
CALCIUM: 7.3 mg/dL — AB
Chloride: 102 mmol/L
Co2: 24 mmol/L
Creatinine: 0.7 mg/dL
EGFR (African American): >60
Glucose: 114 mg/dL — ABNORMAL HIGH
Potassium: 3.4 mmol/L — ABNORMAL LOW
SODIUM: 132 mmol/L — AB

## 2015-01-04 LAB — CBC WITH DIFFERENTIAL/PLATELET
BASOS PCT: 0.3 %
Basophil #: 0 10*3/uL (ref 0.0–0.1)
EOS PCT: 2.6 %
Eosinophil #: 0.4 10*3/uL (ref 0.0–0.7)
HCT: 25.5 % — ABNORMAL LOW (ref 35.0–47.0)
HGB: 8.4 g/dL — AB (ref 12.0–16.0)
LYMPHS PCT: 8.4 %
Lymphocyte #: 1.1 10*3/uL (ref 1.0–3.6)
MCH: 27 pg (ref 26.0–34.0)
MCHC: 33 g/dL (ref 32.0–36.0)
MCV: 82 fL (ref 80–100)
Monocyte #: 1.7 x10 3/mm — ABNORMAL HIGH (ref 0.2–0.9)
Monocyte %: 12.4 %
NEUTROS PCT: 76.3 %
Neutrophil #: 10.2 10*3/uL — ABNORMAL HIGH (ref 1.4–6.5)
Platelet: 262 10*3/uL (ref 150–440)
RBC: 3.12 10*6/uL — ABNORMAL LOW (ref 3.80–5.20)
RDW: 15.2 % — ABNORMAL HIGH (ref 11.5–14.5)
WBC: 13.3 10*3/uL — ABNORMAL HIGH (ref 3.6–11.0)

## 2015-01-04 LAB — URINE CULTURE

## 2015-01-06 ENCOUNTER — Ambulatory Visit
Admit: 2015-01-06 | Disposition: A | Discharge status: Home / Self Care | Payer: Self-pay | Attending: Gastroenterology | Admitting: Gastroenterology

## 2015-01-07 LAB — CULTURE, BLOOD (SINGLE)

## 2015-01-08 ENCOUNTER — Ambulatory Visit
Admit: 2015-01-08 | Disposition: A | Discharge status: Home / Self Care | Payer: Self-pay | Attending: General Practice | Admitting: General Practice

## 2015-01-08 LAB — CBC
HCT: 30.6 % — ABNORMAL LOW (ref 35.0–47.0)
HGB: 9.9 g/dL — ABNORMAL LOW (ref 12.0–16.0)
MCH: 26.2 pg (ref 26.0–34.0)
MCHC: 32.3 g/dL (ref 32.0–36.0)
MCV: 81 fL (ref 80–100)
PLATELETS: 461 10*3/uL — AB (ref 150–440)
RBC: 3.78 10*6/uL — ABNORMAL LOW (ref 3.80–5.20)
RDW: 15.7 % — AB (ref 11.5–14.5)
WBC: 7.5 10*3/uL (ref 3.6–11.0)

## 2015-01-08 LAB — BASIC METABOLIC PANEL
Anion Gap: 11 (ref 7–16)
BUN: 13 mg/dL
CALCIUM: 8.4 mg/dL — AB
CO2: 30 mmol/L
CREATININE: 0.66 mg/dL
Chloride: 93 mmol/L — ABNORMAL LOW
EGFR (African American): >60
EGFR (Non-African Amer.): >60
GLUCOSE: 152 mg/dL — AB
Potassium: 3.4 mmol/L — ABNORMAL LOW
Sodium: 134 mmol/L — ABNORMAL LOW

## 2015-01-08 LAB — URINALYSIS, COMPLETE
Bacteria: NONE SEEN
Bilirubin,UR: NEGATIVE
Glucose,UR: NEGATIVE mg/dL (ref 0–75)
KETONE: NEGATIVE
Leukocyte Esterase: NEGATIVE
Nitrite: NEGATIVE
PH: 7 (ref 4.5–8.0)
PROTEIN: NEGATIVE
Specific Gravity: 1.008 (ref 1.003–1.030)
Squamous Epithelial: NONE SEEN
WBC UR: NONE SEEN /HPF (ref 0–5)

## 2015-01-08 LAB — APTT: Activated PTT: 36.5 secs — ABNORMAL HIGH (ref 23.6–35.9)

## 2015-01-08 LAB — MRSA PCR SCREENING

## 2015-01-08 LAB — SEDIMENTATION RATE: ERYTHROCYTE SED RATE: 115 mm/h — AB (ref 0–30)

## 2015-01-08 LAB — PROTIME-INR
INR: 1.1
Prothrombin Time: 14.1 secs

## 2015-01-10 LAB — URINE CULTURE

## 2015-01-13 LAB — COMPREHENSIVE METABOLIC PANEL
ALK PHOS: 119 U/L
AST: 23 U/L
Albumin: 3.2 g/dL — ABNORMAL LOW
Anion Gap: 10 (ref 7–16)
BUN: 18 mg/dL
Bilirubin,Total: 0.5 mg/dL
CHLORIDE: 91 mmol/L — AB
Calcium, Total: 8.4 mg/dL — ABNORMAL LOW
Co2: 29 mmol/L
Creatinine: 0.74 mg/dL
EGFR (African American): >60
EGFR (Non-African Amer.): >60
GLUCOSE: 83 mg/dL
POTASSIUM: 4.8 mmol/L
SGPT (ALT): 17 U/L
Sodium: 130 mmol/L — ABNORMAL LOW
TOTAL PROTEIN: 7.6 g/dL

## 2015-01-13 LAB — CBC CANCER CENTER
Basophil #: 0 x10 3/mm (ref 0.0–0.1)
Basophil %: 0.5 %
EOS PCT: 6.4 %
Eosinophil #: 0.5 x10 3/mm (ref 0.0–0.7)
HCT: 31.9 % — ABNORMAL LOW (ref 35.0–47.0)
HGB: 10.4 g/dL — ABNORMAL LOW (ref 12.0–16.0)
LYMPHS PCT: 12.6 %
Lymphocyte #: 1 x10 3/mm (ref 1.0–3.6)
MCH: 26.2 pg (ref 26.0–34.0)
MCHC: 32.5 g/dL (ref 32.0–36.0)
MCV: 81 fL (ref 80–100)
MONO ABS: 1.7 x10 3/mm — AB (ref 0.2–0.9)
MONOS PCT: 19.8 %
NEUTROS PCT: 60.7 %
Neutrophil #: 5.1 x10 3/mm (ref 1.4–6.5)
Platelet: 476 x10 3/mm — ABNORMAL HIGH (ref 150–440)
RBC: 3.96 10*6/uL (ref 3.80–5.20)
RDW: 15.5 % — ABNORMAL HIGH (ref 11.5–14.5)
WBC: 8.3 x10 3/mm (ref 3.6–11.0)

## 2015-01-14 ENCOUNTER — Ambulatory Visit
Admit: 2015-01-14 | Disposition: A | Discharge status: Home / Self Care | Payer: Self-pay | Attending: Gastroenterology | Admitting: Gastroenterology

## 2015-01-18 NOTE — H&P (Signed)
PATIENT NAME:  Laura Walters, Laura Walters MR#:  [id number] DATE OF BIRTH:  02/06/1933  DATE OF ADMISSION:  02/06/2014  CHIEF COMPLAINT: Recurrent low back pain. Her low back pain also radiates into the right hip and down the right posterior lateral leg.   PROCEDURE: None.   HISTORY OF PRESENT ILLNESS: Laura Walters is a pleasant 81 year old white female with long-standing history of low back pain, previously treated by use in the [facility] back in 2002. She had a series of epidural steroids for similar low back pain that gave significant improvement in her symptom quality. She is referred by Dr. Oliva Bustard for repeat evaluation. She states that she continues to have recurrent low back pain, primarily centralized in the low back with some radiation down the posterior lateral legs occasionally. She generally takes medication for this, but the pain has gradually intensified to the point where she has been requested to return for re-evaluation and possible repeat injection. She has a history of scoliosis and multilevel degenerative disk disease as well as facet arthropathy based on her MRI report. Otherwise, her symptom quality and characteristics have been stable, with no significant recent changes in lower extremity strength or function or bowel or bladder function. We reviewed her information from evaluation today.   PAST MEDICAL HISTORY: Significant for high cholesterol, rhinitis, history of osteoporosis and history of thyroid cancer, treated by Dr. Oliva Bustard, history of breast biopsy, thyroidectomy and pituitary surgery.   MEDICATIONS: Include Synthroid, tramadol, hydroxyzine, gabapentin, fluticasone and betamethasone as well as Bentyl, vitamin E, calcium, magnesium, glucosamine and ProAir as well as Pulmicort.   ALLERGIES: PENICILLIN, SULFA AND ALEVE.  PHYSICAL EXAMINATION:  VITAL SIGNS: Reveal a VAS of 3/10, temperature 97.1 with a blood pressure of 156/82, pulse 75 and regular, respirations 18 and O2 saturation  100%.  HEART: Regular rate and rhythm.  LUNGS: Clear to auscultation.  MUSCULOSKELETAL AND NEUROLOGIC: Inspection of the low back reveals some mild paraspinous muscle tenderness, but no overt trigger points. She is able to ambulate with an antalgic gait. She does have pain on extension, with bilateral lower extremity hip pain with rotation. With the patient in the seated position, she is able to flex and extend her legs with minimal difficulty, but I would rate this at about 5- out of 5 with strength appearing bilateral and symmetric. Sensation appears to be grossly intact.   ASSESSMENT:  1. Multilevel degenerative disk disease with preferential right hip and posterior lateral leg pain. 2. Bilateral facet arthropathy.  3. Myofascial low back pain. 4. Low back syndrome.  5. History of thyroid cancer, followed by Dr. Oliva Bustard.   PLAN:  1. As discussed with the patient today, I think she would be a candidate for repeat series of epidural steroids, and will plan on doing this at the next available date. I have gone over the risks and benefits of the procedure with her in full detail. All questions are answered. No guarantees are made.  2. Continue with her current medication management.  3. Return to clinic at the next available date and continue followup with Dr. Oliva Bustard for her thyroid cancer and protocol.   ____________________________ Alvina Filbert. Andree Elk, MD jga:lb D: 02/12/2014 12:07:31 ET T: 02/12/2014 12:22:45 ET JOB#: [id number]  cc: Alvina Filbert. Andree Elk, MD, <Dictator> Martie Lee. Oliva Bustard, MD Alvina Filbert Tishawna Larouche MD ELECTRONICALLY SIGNED 02/13/2014 20:31

## 2015-01-20 ENCOUNTER — Inpatient Hospital Stay
Admit: 2015-01-20 | Disposition: A | Discharge status: Skilled Nursing Facility | Payer: Self-pay | Attending: General Practice | Admitting: General Practice

## 2015-01-20 LAB — POTASSIUM: POTASSIUM: 3.9 mmol/L

## 2015-01-20 LAB — SURGICAL PATHOLOGY

## 2015-01-21 LAB — BASIC METABOLIC PANEL
ANION GAP: 5 — AB (ref 7–16)
BUN: 12 mg/dL
CHLORIDE: 104 mmol/L
Calcium, Total: 7.6 mg/dL — ABNORMAL LOW
Co2: 26 mmol/L
Creatinine: 0.66 mg/dL
EGFR (African American): >60
EGFR (Non-African Amer.): >60
GLUCOSE: 102 mg/dL — AB
POTASSIUM: 3.8 mmol/L
SODIUM: 135 mmol/L

## 2015-01-21 LAB — HEMOGLOBIN
HGB: 8.5 g/dL — AB (ref 12.0–16.0)
HGB: 8.2 g/dL — AB (ref 12.0–16.0)

## 2015-01-21 LAB — PLATELET COUNT: Platelet: 306 10*3/uL (ref 150–440)

## 2015-01-22 ENCOUNTER — Encounter
Admit: 2015-01-22 | Disposition: A | Discharge status: Home / Self Care | Payer: Self-pay | Attending: Internal Medicine | Admitting: Internal Medicine

## 2015-01-22 LAB — BASIC METABOLIC PANEL
Anion Gap: 5 — ABNORMAL LOW (ref 7–16)
BUN: 11 mg/dL
CHLORIDE: 105 mmol/L
CREATININE: 0.62 mg/dL
Calcium, Total: 7.3 mg/dL — ABNORMAL LOW
Co2: 24 mmol/L
EGFR (African American): >60
EGFR (Non-African Amer.): >60
GLUCOSE: 105 mg/dL — AB
Potassium: 3.4 mmol/L — ABNORMAL LOW
SODIUM: 134 mmol/L — AB

## 2015-01-22 LAB — PLATELET COUNT: Platelet: 249 10*3/uL (ref 150–440)

## 2015-01-22 LAB — HEMOGLOBIN: HGB: 8.3 g/dL — ABNORMAL LOW (ref 12.0–16.0)

## 2015-01-22 LAB — SURGICAL PATHOLOGY

## 2015-01-23 LAB — BASIC METABOLIC PANEL
ANION GAP: 7 (ref 7–16)
BUN: 10 mg/dL
CALCIUM: 7.5 mg/dL — AB
CHLORIDE: 102 mmol/L
Co2: 24 mmol/L
Creatinine: 0.64 mg/dL
EGFR (African American): >60
EGFR (Non-African Amer.): >60
GLUCOSE: 114 mg/dL — AB
Potassium: 4 mmol/L
SODIUM: 133 mmol/L — AB

## 2015-01-23 LAB — HEMOGLOBIN
HGB: 7.9 g/dL — ABNORMAL LOW (ref 12.0–16.0)
HGB: 8.2 g/dL — AB (ref 12.0–16.0)
HGB: 9.4 g/dL — ABNORMAL LOW (ref 12.0–16.0)

## 2015-01-23 LAB — PLATELET COUNT: Platelet: 266 10*3/uL (ref 150–440)

## 2015-01-26 NOTE — Discharge Summary (Addendum)
PATIENT NAME:  Laura Walters, Laura Walters MR#:  [id number] DATE OF BIRTH:  August 21, 1933  DATE OF ADMISSION:  01/20/2015 DATE OF DISCHARGE:  01/24/2015  ADMITTING DIAGNOSIS: Degenerative arthrosis of left hip.   DISCHARGE DIAGNOSIS: Degenerative arthrosis of left hip.  HISTORY: The patient is a pleasant 81 year old female who has been followed at [facility] for progression of left hip pain. The patient had reported over a 1 year history of progressive left buttock and groin pain. She did not recall any specific trauma or aggravating event. She had appreciated progressive decrease in her left hip range of motion. The pain was noted to be aggravated with weight-bearing activities as well as rotation. She has been using a walker for ambulation. She does report some bilateral lower extremity radicular symptoms and has been followed by Dr. Manuella Ghazi for idiopathic peripheral neuropathy. The patient states that her left hip pain had progressed to the point that it was significantly interfering with her activities of daily living. X-rays taken in [place] showed significant narrowing of the cartilage space with bone-on-bone being appreciated superiorly. Subchondral sclerosis was noted as well as osteophyte formation. MRI performed on November 06, 2014 showed a left femoral head avascular necrosis. After discussion of the risks and benefits of surgical intervention, the patient expressed her understanding of the risks and benefits and agreed for plans for surgical intervention.   PROCEDURE: Left total hip arthroplasty.   ANESTHESIA: Spinal.   IMPLANTS UTILIZED: DePuy 13.5 mm small stature AML femoral stem, 50 mm outer diameter Pinnacle 100 acetabular shell, +4 mm 10 degree Pinnacle Marathon polyethylene liner, and a 32 mm outer diameter femoral head with a +1 mm neck length.   HOSPITAL COURSE: The patient tolerated the procedure very well. She had no complications. She was then taken to the PACU where she  was stabilized and then transferred to the orthopedic floor. The patient began receiving anticoagulation therapy of Lovenox 30 mg every 12 hours per anesthesia and pharmacy protocol. She was fitted with TED stockings bilaterally. These were allowed to be removed 1 hour per 8 hour shift. The patient was also fitted with the AV-I compression foot pumps bilaterally set at 80 mmHg. Her calves have been nontender. There has been no evidence of any DVTs. Negative Homans sign. Heels were elevated off the bed using rolled towels.   The patient has denied any chest pain or shortness of breath. Vital signs have been stable. She has been afebrile. Hemodynamically hemoglobin had dropped to 7.9 and subsequently 1 unit of packed RBCs was administered. Upon being discharged, hemoglobin was at 9.4. She was noted to have increased energy level as well as skin color improving.   Physical therapy was initiated on day 1 for gait training and transfers. Upon being discharged was ambulating 150 feet and had progressed very nicely. She started off very slow, however, this was felt to be secondary to the Duragesic patch. This was discontinued at the time of discharge. Physical therapy was also initiated on day 1 for ADL and assistive devices.   The patient's IV, Foley and Hemovac were DC'd on day 2. The wound was free of any drainage or signs of infection.   DISPOSITION: The patient is being discharged to a skilled nursing facility in improved stable condition. This has been an unremarkable hospital course.   DISCHARGE INSTRUCTIONS: She may continue weight-bear as tolerated. Continue using a rolling walker until cleared by physical therapy to go to a quad cane. She will continue to receive  PT for gait training and transfers and OT for ADL and assistive devices. Continue TED stockings bilaterally. These are allowed to be removed 1 hour per 8 hour shift. Elevate the lower extremities. Elevate the heels off the bed using rolled  towels. Incentive spirometer q. 1 hours while awake. Encourage cough and deep breathing q. 2 hours while awake. She is placed on a regular diet. She was instructed in wound care. The staples will need to be removed on May 9th followed by the application of benzoin and Steri-Strips. She has a follow-up appointment in [facility] on June 7th at 10:45, sooner if any complications.   DRUG ALLERGIES: ALEVE, CODEINE, PENICILLIN, SULFA.  DISCHARGE MEDICATIONS: Coreg 3.125 mg b.i.d., Senokot-S 1 tablet b.i.d., duloxetine 60 mg daily, ferrous sulfate 325 mg b.i.d. with meals, Lasix 40 mg daily, Neurontin 400 mg q.i.d., Synthroid 0.1 mg q. 6:00 a.m., pantoprazole 40 mg b.i.d., Lovenox 40 mg daily for 14 days and then discontinue and begin taking one 81 mg enteric-coated aspirin, Tylenol ES 500 to 1000 mg q. 4 hours p.r.n., Norco 5/325 one to two tablets q. 4 to 6 hours p.r.n. for pain, milk of magnesia 30 mL b.i.d. p.r.n., Mylanta DS 30 mL q. 6 hours p.r.n., Dulcolax suppositories 10 mg rectally p.r.n. for constipation if no results with milk of magnesia, enema soapsuds if no results with milk of magnesia or Dulcolax.   PAST MEDICAL HISTORY: Essential hypertension, unspecific hereditary and idiopathic peripheral neuropathy, lumbar radiculitis, unspecific hypothyroidism, history of thyroid cancer, history of pituitary tumor, reactive airway disease, fibrocystic breast disease.  ____________________________ Vance Peper, PA jrw:sb D: 01/24/2015 07:36:50 ET T: 01/24/2015 08:03:32 ET JOB#: [id number]  cc: Vance Peper, PA, <Dictator> JON WOLFE PA ELECTRONICALLY SIGNED 01/24/2015 21:23

## 2015-01-26 NOTE — H&P (Signed)
PATIENT NAME:  Laura Walters, Laura Walters MR#:  [id number] DATE OF BIRTH:  08-12-1933  DATE OF ADMISSION:  01/03/2015  The patient was evaluated before 12 midnight of 01/02/2015.    REFERRING DOCTOR:  Rebecca L. Reita Cliche, MD   PRIMARY CARE PRACTITIONER: Leonie Douglas. Doy Hutching, MD of [place] ONCOLOGIST: Verdene Rio   ADMITTING DOCTOR: Juluis Mire, MD     CHIEF COMPLAINT:  1.  Generalized weakness of few days duration.  2.  Lightheadedness.  3.  Confusion since afternoon.   HISTORY OF PRESENT ILLNESS: A 81 year old Caucasian female with a past medical history of thyroid cancer, status post surgery about 12 years ago and status post radioactive ablation, history of pituitary tumor, status post surgery 15 years ago, idiopathic cardiomyopathy, degenerative joint disease of LS-spine and left hip, chronic anemia, allergic rhinitis, neuropathy who was brought by EMS with the complaints of generalized weakness ongoing for the past few days and was also noted to have confusion with altered sensorium, which started around afternoon today. According to the patient's son and daughter-in-law who are with the patient at this time, the patient has been having some generalized weakness with poor oral intake for the past few days, and this morning while she was taking one of her pills she choked and aspirated and later in the afternoon she was noted to have confusion with altered sensorium, hence brought to the Emergency Room for further evaluation.   In the Emergency Room, on arrival patient was noted to have a temperature of 101.9 degrees Fahrenheit but the vital signs were stable and workup revealed elevated white blood cell count of 13.0 and a chest x-ray with bibasilar haziness/atelectasis with possible aspiration. A CT head noncontrast study was also obtained, which revealed hyperdense lesion in the sella turcica region suspicious for recurrence of pituitary tumor. No history of any fever prior to coming to the  hospital as per the patient's son and daughter-in-law who are with the patient at this time. She does have some cough. The patient is sound asleep now, arousable but not able to give a detailed history because of sleepiness. Denies any chest pain. She did not have any nausea or vomiting, but as mentioned earlier, she aspirated, she choked while taking a pill and aspirated yesterday morning. No diarrhea. She was treated for a UTI about last week with 1 week of Cipro. Denies any dysuria, frequency, urgency. She had a history of DJD of  left hip and she is scheduled to undergo surgery in a  months' time, and in anticipation of surgery she had a cardiology evaluation recently with a negative nuclear study and cleared cardiac wise for the surgery. She also had undergone a PET scan as a routine follow up for her thyroid cancer, which according to the patient's son, is negative.    PAST MEDICAL HISTORY:  1.  Thyroid cancer, status post surgery about 12 years ago, status post radioactive ablation.  2.  Pituitary tumor, status post surgery about 15 years ago.  3.  Neuropathy.  4.  Degenerated joint disease of LS spine and left hip, awaiting  to undergo left hip replacement in a months' time.  5.  Chronic anemia.  6.  Idiopathic cardiomyopathy.  9.  Allergic rhinitis.   PAST SURGICAL HISTORY:  1.  Thyroidectomy.  2.  Pituitary surgery.  3.  Breast biopsy, which was benign.  4.  Hemorrhoidectomy.   ALLERGIES: PENICILLIN, SULFA, AND ALEVE.   SOCIAL HISTORY:  She is married,  lives with her husband at home. No history of any smoking, alcohol, or substance abuse per patient's family.   FAMILY HISTORY: No history of any hypertension, diabetes, cholesterol, heart problems, cancers according to the patient's family.   REVIEW OF SYSTEMS:  CONSTITUTIONAL SYMPTOMS: Negative for fever or chills but positive for generalized weakness ongoing for the past few days. Of note, the patient was noted to have a temperature  of 101.9 degrees Fahrenheit on arrival to Emergency Room.  EYES: Negative for blurred vision, double vision. No pain. EARS, NOSE, AND THROAT: Negative for tinnitus, ear pain, hearing loss, nasal discharge.  RESPIRATORY: Negative for  wheezing, dyspnea, hemoptysis, painful respiration. Positive for some cough.  CARDIOVASCULAR: Negative for chest pain, palpitations, syncopal episodes, orthopnea, dyspnea on exertion, pedal edema. Positive for dizziness.  GASTROINTESTINAL: Negative for nausea, vomiting, diarrhea, constipation, abdominal pain, hematemesis, melena. She did choke while having a pill this morning.  GENITOURINARY: Negative for dysuria, frequency, urgency, hematuria. The patient was treated recently for a UTI with 1 week of Cipro.  ENDOCRINE: Negative for polyuria, nocturia, heat or cold intolerance.  HEMATOLOGIC AND LYMPHATICS: Positive for chronic anemia for which she takes iron pills. Negative for easy bruising, bleeding, swollen glands.  INTEGUMENTARY: Negative for acne, skin rash, or lesions.   MUSCULOSKELETAL: Positive for chronic back pain and chronic left hip pain secondary due to degenerative joint disease, awaiting for hip replacement in a month's time.  NEUROLOGICAL: Negative for focal weakness or numbness. No history of CVA, TIA, or seizure disorder.  PSYCHIATRIC: Negative for anxiety, insomnia, depression.   PHYSICAL EXAMINATION:  VITAL SIGNS: Temperature 101.9 degrees  Fahrenheit, pulse rate 96 per minute,  respirations 16 per minute, blood pressure 143/74, O2  saturation is 94% on room air.  GENERAL: Well developed, well nourished, sleepy but arousable and responded to questions appropriately, not in any acute distress and comfortable lying in the bed.  HEAD: Atraumatic, normocephalic.  EYES: Pupils equal, react to light and accommodation. No conjunctival pallor. No icterus. Extraocular movements intact.  NOSE: No drainage.  EARS: No drainage.  No external lesions.  ORAL  CAVITY: Dry mucous membranes.  No exudates.  No mucosal lesions.   NECK: Supple. No JVD. No thyromegaly. No carotid bruit. Range of motion within normal limits.  RESPIRATORY:  Good respiratory effort. Not using accessory muscles of respiration. Bilateral vesicular breath sounds and no rales or rhonchi.  CARDIOVASCULAR: S1, S2 regular. No murmurs, gallops, or clicks. Pulses equal at carotid, femoral, and pedal pulses. No peripheral edema.  GASTROINTESTINAL: Abdomen soft, nontender. No hepatosplenomegaly. No masses. No rigidity. No guarding. Bowel sounds present and equal in all 4 quadrants.  GENITOURINARY: Deferred.  MUSCULOSKELETAL: No joint tenderness or effusion. Range of motion adequate.  SKIN: Inspection within normal limits.  LYMPHATIC: No cervical lymphadenopathy.  VASCULAR: Good dorsalis pedis, posterior tibial pulses.  NEUROLOGICAL: Alert, awake, and oriented x 3. Cranial nerves II through XII grossly intact. No sensory deficit. Motor strength 5/5 in both upper and lower extremities. DTRs 2+ bilateral and symmetrical.  PSYCHIATRIC: Alert, awake, and oriented x 3. Judgment, insight adequate. Memory and mood within normal limits.   ANCILLARY DATA:  LABORATORY DATA: Serum glucose 191, BUN 20, creatinine 0.85, sodium 129, potassium 3.1, chloride 90, bicarbonate 29, total calcium 8.3, troponin less than 0.03, WBC 13.0, hemoglobin 9.8, hematocrit 29.7, platelet count 345,000. Urinalysis: Clear, leukocyte esterase 2+, WBC 0 to 5, bacteria none.  IMAGING STUDIES: Chest x-ray: Bibasilar atelectasis, greater on the right, given the history could  reflect underlying aspiration.   CT of the head noncontrast study: A 2.1 x 81.9 cm hyperdense lesion in the sella turcica area suspicious for a recurrence of pituitary tumor, negative for acute hemorrhage or acute infarction or a space occupying mass lesion. Chronic sinus disease.   EKG: Normal sinus rhythm with a ventricular rate of 93 beats per minute,  nonspecific ST, T changes.   ASSESSMENT AND PLAN: A 81 year old Caucasian female with a history of thyroid cancer, status post thyroidectomy, pituitary tumor, status post surgery in the remote past, idiopathic cardiomyopathy, chronic anemia, allergic rhinitis, neuropathy, degenerative joint disease of the spine and left hip presents with generalized weakness of a few days duration, found to be febrile with a temperature of 101.9 degrees Fahrenheit and confusion on arrival. The patient choked with pill intake earlier today and workup revealed leukocytosis with a WBC count of 13.9 and sodium 129 and a chest x-ray with basilar atelectasis with likely aspiration and a CT head with hyperdense lesion in the sella turcica region suspicious for recurrent pituitary tumor.   1.  Acute encephalopathy secondary due to sepsis/systemic inflammatory response syndrome, possible aspiration pneumonia. The patient was treated for UTI about a week ago with Cipro.  Plan: We will admit to medical floor and place her on neuro watch. Blood and urine cultures obtained. Start IV antibiotics and clindamycin for likely aspiration pneumonia. Follow up CBC and cultures.  2.  Systemic inflammatory response syndrome/sepsis, likely aspiration pneumonia.  Plan: As above. Will keep her NPO until evaluation for swallow study. 3.  Hyponatremia likely secondary due to dehydration.  Plan: Gentle IV hydration. Follow BMP.  4.  Hyperdense lesion on the CT of the head suspicious for recurrent pituitary tumor. Needs further workup including a CT or MRA of the head with contrast once the patient's condition is stable.  5.  History of thyroid cancer, status post thyroidectomy. The patient is under the care of oncologist. Recent PET scan done about a week ago per patient's family was negative. Continue levothyroxine.    6.  Degenerative joint disease left hip and lumbosacral spine. Awaiting for total hip replacement. The patient on chronic pain  medications, continue pain medications as needed.  7.  History of idiopathic cardiomyopathy, stable on medications. Continue carvedilol and we will hold off spironolactone and furosemide for now because of dehydration.  8.  Chronic anemia. Hemoglobin and hematocrit mildly low but stable. Continue patient on iron pills, continue iron pills.  9.  Deep vein thrombosis prophylaxis, subcutaneous Lovenox.  10.  Gastrointestinal prophylaxis, proton pump inhibitor.   CODE STATUS: Full code.   TIME SPENT: 50 minutes.    ____________________________ Juluis Mire, MD enr:AT D: 01/03/2015 00:30:33 ET T: 01/03/2015 02:19:41 ET JOB#: [id number]  cc: Juluis Mire, MD, <Dictator> Leonie Douglas. Doy Hutching, MD Juluis Mire MD ELECTRONICALLY SIGNED 01/03/2015 20:38

## 2015-01-26 NOTE — Op Note (Signed)
PATIENT NAME:  Laura Walters, Laura Walters MR#:  [id number] DATE OF BIRTH:  05/15/1933  DATE OF PROCEDURE:  01/20/2015  PREOPERATIVE DIAGNOSIS: Degenerative arthrosis of the left hip (primary).   POSTOPERATIVE DIAGNOSIS: Degenerative arthrosis of the left hip (primary).   PROCEDURE PERFORMED: Left total hip arthroplasty.   SURGEON: Crista Luria., MD   ASSISTANT: Vance Peper, PA (required to maintain retraction throughout the procedure).   ANESTHESIA: Spinal.   ESTIMATED BLOOD LOSS: 100 mL.   FLUIDS REPLACED: 1400 mL of crystalloid.   DRAINS: Two medium drains to a Hemovac reservoir.   IMPLANTS UTILIZED: DePuy 13.5 mm small stature AML femoral stem, 50 mm outer diameter Pinnacle 100 acetabular shell, +4 mm 10 degree Pinnacle Marathon polyethylene liner, and a 32 mm outer diameter femoral head with a +1 mm neck length.   INDICATIONS FOR SURGERY: The patient is a 81 year old female who has been seen for progressive left hip and groin pain. X-rays demonstrated severe degenerative changes with full-thickness loss of articular cartilage. After discussion of the risks and benefits of surgical intervention, the patient expressed understanding of the risks, benefits, and agreed with plans for surgical intervention.   PROCEDURE IN DETAIL: The patient was brought in the Operating Room, and after adequate spinal anesthesia was achieved, the patient was placed in right lateral decubitus position. Axillary roll was placed and all bony prominences were well padded. The patient's left hip and leg were cleaned and prepped with alcohol and DuraPrep, draped in the usual sterile fashion. A "timeout" was performed as per usual protocol. A lateral curvilinear incision was made gently curving towards the posterior superior iliac spine. IT band was incised in line with the skin incision. Fibers of the gluteus maximus were split in line. Piriformis tendon was identified, skeletonized, and incised at its insertion in the  proximal femur and reflected posteriorly. In a similar fashion, the short external rotators were incised. The posterior capsulotomy was performed with the capsule relatively adherent to the rotators. Prior to dislocation of the femoral head, a threaded Steinmann pin was inserted through a separate stab incision into the pelvis and bent to the form of a stylus so as to assess limb length and hip offset throughout the procedure. The femoral head was then dislocated posteriorly. Inspection of the head demonstrated severe degenerative changes with some collapse of the subchondral bone and loose cartilage fragments. The femoral neck cut was performed using an oscillating saw. The anterior capsule was elevated off of the femoral neck.   Attention was then directed to the acetabulum. Remnant of the labrum was excised using electrocautery. Gross irregularity of the acetabulum was noted consistent with the degenerative process. The acetabulum was reamed in a sequential fashion up to a 49 mm diameter. This allowed for good punctate bleeding bone. Central area was noted to be extremely soft, but good  rim was appreciated. A 50 mm outer diameter Pinnacle 100 acetabular shell was positioned and impacted into place. Good scratch fit was appreciated. A +4 mm neutral polyethylene trial was inserted and attention was directed to the femur. Pilot hole for reaming of the proximal femoral canal was created using a high-speed bur. Proximal femoral canal was reamed in a sequential fashion up to a 13 mm diameter. This allowed for approximately 6 cm of scratch fit. The proximal femur was then prepared using a 13.5 mm aggressive side-biting reamer. Serial broaches were inserted up to a 13.5 mm small stature broach. The calcar region was planed and trial reduction  was performed with a 32 mm hip ball with a +1 mm neck length. This allowed for good equalization of limb lengths and improved offset. However, there was some concern about  posterior stability. The +4 neutral polyethylene trial was replaced with a +4 mm 10 degree liner with the high side position approximately 4 o'clock position. This allowed for excellent anterior and posterior stability. Trial components were removed. The acetabular shell was irrigated and  suctioned dry. A +4 mm 10 degree Pinnacle Marathon polyethylene liner was positioned with  high side at approximately 4 o'clock position and impacted into place. Next, a 13.5 mm small stature AML femoral component was positioned and impacted into place. Excellent scratch fit was appreciated. The Morse taper was cleaned and dried. A 32 mm cobalt chrome hip ball with a +1 mm neck length was placed on the trunnion and impacted into place. The hip was reduced and placed through range of motion. Good equalization of limb lengths was appreciated and increased offset was noted. Good stability was noted both anteriorly and posteriorly. The wound was irrigated with copious amounts of normal saline with antibiotic solution using pulsatile lavage and then suctioned dry. Good hemostasis was appreciated. The piriformis tendon and short external rotators with capsule were then reapproximated to the undersurface of the gluteus medius tendon using #5 Ethibond. Two medium drains were placed in the wound bed and brought out through a separate stab incision to be attached to a Hemovac reservoir. IT band was repaired using interrupted sutures of #1 Vicryl. The subcutaneous tissue was approximated in layers using first #0 Vicryl followed by 2-0 Vicryl. Skin was closed with skin staples. A sterile dressing was applied. The patient tolerated the procedure well. She was transported to the recovery room in stable condition.    ____________________________ Laurice Record. Holley Bouche., MD jph:TT D: 01/20/2015 17:20:16 ET T: 01/21/2015 03:34:15 ET JOB#: [id number]  cc: Jeneen Rinks P. Holley Bouche., MD, <Dictator> JAMES P Holley Bouche MD ELECTRONICALLY SIGNED 01/23/2015  7:14

## 2015-03-06 ENCOUNTER — Ambulatory Visit: Payer: Medicare Other | Attending: Orthopedic Surgery | Admitting: Physical Therapy

## 2015-03-06 ENCOUNTER — Encounter: Payer: Self-pay | Admitting: Physical Therapy

## 2015-03-06 DIAGNOSIS — R262 Difficulty in walking, not elsewhere classified: Secondary | ICD-10-CM | POA: Insufficient documentation

## 2015-03-06 DIAGNOSIS — M25552 Pain in left hip: Secondary | ICD-10-CM | POA: Insufficient documentation

## 2015-03-06 DIAGNOSIS — M6281 Muscle weakness (generalized): Secondary | ICD-10-CM | POA: Insufficient documentation

## 2015-03-06 DIAGNOSIS — Z96642 Presence of left artificial hip joint: Secondary | ICD-10-CM | POA: Insufficient documentation

## 2015-03-06 DIAGNOSIS — Z471 Aftercare following joint replacement surgery: Secondary | ICD-10-CM | POA: Diagnosis not present

## 2015-03-06 NOTE — Therapy (Signed)
[facility] [address] [place], [name], [address] Phone: [phone]   Fax:  [phone]  Physical Therapy Evaluation  Patient Details  Name: Laura Walters MRN: [id number] Date of Birth: 1933-05-20 Referring Provider:  Dereck Leep, MD  Encounter Date: 03/06/2015      PT End of Session - 03/07/15 1004    Visit Number 1   Number of Visits 12   Date for PT Re-Evaluation 04/17/15   Authorization - Visit Number 1   Authorization - Number of Visits 10   PT Start Time 7989   PT Stop Time 2119   PT Time Calculation (min) 64 min   Equipment Utilized During Treatment Gait belt   Activity Tolerance Patient limited by fatigue;Patient limited by pain   Behavior During Therapy [facility] for tasks assessed/performed      No past medical history on file.  No past surgical history on file.  There were no vitals filed for this visit.  Visit Diagnosis:  Left hip pain  Muscle weakness  Difficulty walking      Subjective Assessment - 03/06/15 1315    Subjective Pt. reports 8/10 L hip pain currently at rest.  Pt. stopped taking using Fentyl Monday night and states pain increased after x-rays on Tuesday.     Limitations Sitting;Lifting;Standing;Walking   How long can you stand comfortably? <5 minutes   How long can you walk comfortably? pt. able to walk 100 feet comfortably with rollator.     Diagnostic tests x-ray   Patient Stated Goals Increase L hip ROM/ strength and decrease pain with daily tasks/ walking.     Pain Score 8    Pain Location Hip   Pain Orientation Left   Pain Descriptors / Indicators Aching   Pain Type Surgical pain   Pain Onset In the past 7 days   Aggravating Factors  standing/walking   Pain Relieving Factors nothing            PT Education - 03/07/15 1004    Education provided Yes   Education Details HEP/ gait pattern with use of rollator/ ice vs. heat   Person(s) Educated Patient   Methods  Explanation;Demonstration;Verbal cues   Comprehension Verbalized understanding;Returned demonstration       Pt. is a pleasant 81 y/o female s/p L THA on 01/20/15.  Pt. reports recent exacerbation of L hip pain after lying on x-ray table a few days ago.  Pt. reports 8/10 L hip pain currently while walking into PT clinic.  R hip AROM WFL and strength grossly 4+/5 MMT except hip flexion/ adduction 4/5 MMT.  L hip generalized stiffness with supine AROM and strength grossly 4/5 MMT except hip flexion 3/5 MMT.  Pt. requires max. UE assist to stand from chair with use of rollator.  Pt. ambulates with significant L antalgic gait pattern with decrease stance time and use of rollator.  Pt. has good HEP in place from [place] and will continue on a daily basis.  Good incision healing with no tenderness with palpation.  Pt. will benefit from skilled PT services to increase L hip AROM/ strength to improve walking/ daily functional mobility.     Problem List There are no active problems to display for this patient.   Pura Spice, PT, DPT # [phone]   03/07/2015, 10:28 AM  [facility] [address] [place], [name], [address] Phone: [phone]   Fax:  [phone]

## 2015-03-10 ENCOUNTER — Ambulatory Visit: Payer: Medicare Other | Admitting: Physical Therapy

## 2015-03-10 ENCOUNTER — Encounter: Payer: Self-pay | Admitting: Physical Therapy

## 2015-03-10 DIAGNOSIS — M6281 Muscle weakness (generalized): Secondary | ICD-10-CM

## 2015-03-10 DIAGNOSIS — Z471 Aftercare following joint replacement surgery: Secondary | ICD-10-CM | POA: Diagnosis not present

## 2015-03-10 DIAGNOSIS — R262 Difficulty in walking, not elsewhere classified: Secondary | ICD-10-CM

## 2015-03-10 DIAGNOSIS — M25552 Pain in left hip: Secondary | ICD-10-CM

## 2015-03-10 NOTE — Therapy (Signed)
[facility] [address] [place], [name], [address] Phone: [phone]   Fax:  [phone]  Physical Therapy Treatment  Patient Details  Name: Laura Walters MRN: [id number] Date of Birth: 1933-09-07 Referring Provider:  Dereck Leep, MD  Encounter Date: 03/10/2015      PT End of Session - 03/10/15 1407    Visit Number 2   Number of Visits 12   Date for PT Re-Evaluation 04/17/15   Authorization - Visit Number 2   Authorization - Number of Visits 10   PT Start Time 2248   PT Stop Time 1356   PT Time Calculation (min) 59 min   Equipment Utilized During Treatment Gait belt   Activity Tolerance Patient limited by fatigue;Patient limited by pain   Behavior During Therapy [facility] for tasks assessed/performed      History reviewed. No pertinent past medical history.  History reviewed. No pertinent past surgical history.  There were no vitals filed for this visit.  Visit Diagnosis:  Left hip pain  Muscle weakness  Difficulty walking      Subjective Assessment - 03/10/15 1405    Subjective Pt. continues to report high levels of L hip pain at rest and especially with walking.   Pt. c/o 4-8/10 L hip pain and is planning on contacting MD after PT today to discuss recent change in pain meds.  Pt. states she is on a new pain medication at a lower dosage with no benefit.     Limitations Sitting;Lifting;Standing;Walking   How long can you stand comfortably? <5 minutes   How long can you walk comfortably? pt. able to walk 100 feet comfortably with rollator.     Diagnostic tests x-ray   Patient Stated Goals Increase L hip ROM/ strength and decrease pain with daily tasks/ walking.     Currently in Pain? Yes   Pain Score 7    Pain Location Hip   Pain Orientation Left   Pain Descriptors / Indicators Aching   Pain Type Surgical pain       OBJECTIVE:  Nustep L4 7 min. B UE/LE (warm-up/ no charge)- no increase c/o pain.  Seated 2# LAQ  (20x)/ hip flexion (25x)/ GTB hip abduction (10x2)/ heel raises (20x)/ 2# standing hip flexion (20x)- fatigue/ walking in //-bars/ side stepping 12 feet x 2 (mirror feedback).  Supine L hip flexion with yellow ball 20x (good ball control).  Supine L hip abd. 5x stretches as tolerated.  Manual tx.:  Supine L /R hip and LE stretches (14 min.).  Gait training: ambulating in //-bars working on consistent step pattern/ hip flexion/ heel strike with upright posture (verbal cuing provided).  Step ups/ overs in //-bars with use of L LE and B UE assist for safety and balance.  Ambulate in clinic/ hallway with use of rollator and recip. Step pattern with CGA for safety/ verbal cuing.      Pt response for medical necessity:  Verbal cuing and CGA required for proper gait pattern and safety.  Pt. Requires use of rollator for safety and unable to complete 1 SLR at this time.  Maintains hip precautions t/o tx.           PT Education - 03/10/15 1407    Education provided Yes   Education Details discussed 1-2# ankle wts with HEP   Person(s) Educated Patient   Methods Explanation;Demonstration   Comprehension Verbalized understanding;Returned demonstration  PT Long Term Goals - 03/07/15 1023    PT LONG TERM GOAL #1   Title Pt. I with HEP to increase L hip strength to grossly 4+/5 MMT to improve pain-free mobility/ walking.     Time 6   Period Weeks   Status New   PT LONG TERM GOAL #2   Title Pt. will complete LEFS and score >40 out of 80 to improve pain-free mobility.     Time 6   Period Weeks   Status New   PT LONG TERM GOAL #3   Title Pt. will ambulate community distance with more normalized gait pattern and least assistive device safely.    Time 6   Period Weeks   Status New   PT LONG TERM GOAL #4   Title Pt. able to ascend/ descend 10 steps with recip. pattern and use of handrails safely.    Time 6   Period Weeks   Status New               Plan - 03/10/15 1408     Clinical Impression Statement Pt. remains pain focused/ muscle guarded with gentle L hip/LE stretches.  Pt. unable to complete independent L SLR without max. assist x1.  Pt. ambulates with moderate L antalgic gait pattern with use of rollator and poor standing/ ther.ex. endurance requiring several seated rest breaks between tasks.     Pt will benefit from skilled therapeutic intervention in order to improve on the following deficits Abnormal gait;Decreased endurance;Hypomobility;Decreased scar mobility;Decreased activity tolerance;Decreased strength;Pain;Difficulty walking;Decreased mobility;Decreased balance;Improper body mechanics;Decreased safety awareness   Rehab Potential Good   PT Frequency 2x / week   PT Duration 4 weeks   PT Treatment/Interventions ADLs/Self Care Home Management;Neuromuscular re-education;Aquatic Therapy;Passive range of motion;Patient/family education;Gait training;Stair training;Cryotherapy;Functional mobility training;Therapeutic activities;Therapeutic exercise;Balance training;Manual techniques   PT Next Visit Plan progress HEP/ gait training.    PT Home Exercise Plan continue with current HEP   Consulted and Agree with Plan of Care Patient        Problem List There are no active problems to display for this patient.     Pura Spice, PT, DPT # [phone]   03/10/2015, 2:17 PM  [place] [facility] [address] [place], [name], [address] Phone: [phone]   Fax:  [phone]

## 2015-03-13 ENCOUNTER — Ambulatory Visit: Payer: Medicare Other | Admitting: Physical Therapy

## 2015-03-13 DIAGNOSIS — M25552 Pain in left hip: Secondary | ICD-10-CM

## 2015-03-13 DIAGNOSIS — Z471 Aftercare following joint replacement surgery: Secondary | ICD-10-CM | POA: Diagnosis not present

## 2015-03-13 DIAGNOSIS — R262 Difficulty in walking, not elsewhere classified: Secondary | ICD-10-CM

## 2015-03-13 DIAGNOSIS — M6281 Muscle weakness (generalized): Secondary | ICD-10-CM

## 2015-03-13 NOTE — Therapy (Signed)
[facility] [address] [place], [name], [address] Phone: [phone]   Fax:  [phone]  Physical Therapy Treatment  Patient Details  Name: Laura Walters MRN: [id number] Date of Birth: 1933-06-24 Referring Provider:  Dereck Leep, MD  Encounter Date: 03/13/2015      PT End of Session - 03/14/15 0838    Visit Number 3   Number of Visits 12   Date for PT Re-Evaluation 04/17/15   Authorization - Visit Number 3   Authorization - Number of Visits 10   PT Start Time 6962   PT Stop Time 1354   PT Time Calculation (min) 59 min   Equipment Utilized During Treatment Gait belt   Activity Tolerance Patient limited by fatigue;Patient limited by pain   Behavior During Therapy [facility] for tasks assessed/performed      No past medical history on file.  No past surgical history on file.  There were no vitals filed for this visit.  Visit Diagnosis:  Left hip pain  Muscle weakness  Difficulty walking      Subjective Assessment - 03/13/15 1256    Subjective Pt. has appt. tomorrow to discuss her pain meds with Dr. Doy Hutching.  Pt. continues to report L hip discomfort 5/10 and states she is doing somewhat better.     Limitations Sitting;Lifting;Standing;Walking   How long can you stand comfortably? <5 minutes   How long can you walk comfortably? pt. able to walk 100 feet comfortably with rollator.     Patient Stated Goals Increase L hip ROM/ strength and decrease pain with daily tasks/ walking.     Currently in Pain? Yes   Pain Score 5    Pain Location Hip   Pain Orientation Left         OBJECTIVE:  Sit to stands 10x with min. UE assist on handrails.  Step ups/overs on 3" plinths 20x.  Recip. Step touches on 6" step with B UE to no UE assist (CGA for safety/ cuing).  Walking in //-bars with use of R UE only and recip. Pattern and moderate cuing for posture/ heel strike/ gait pattern.  Ambulate in //-bars/ clinic with use of SPC on R  with gait instruction/ 2-point gait pattern.  Nustep L4 10 min. B UE/LE (warm-up/ no charge)- no increase c/o pain.  Seated LAQ (25x)/ hip flexion (25x)/ heel raises (25x)/ Supine L hip flexion AAROM 10x2. Supine L hip abd. 5x stretches as tolerated. Manual tx.: Supine L /R hip and LE stretches (gentle, controlled stretches with increase static holds as tolerated).   Pt response for medical necessity: Verbal cuing and CGA required for proper gait pattern and safety. Poor SLR but able to safely ambulate with use of SPC and CGA with proper 2-point gait pattern. Maintains hip precautions t/o tx.          PT Long Term Goals - 03/07/15 1023    PT LONG TERM GOAL #1   Title Pt. I with HEP to increase L hip strength to grossly 4+/5 MMT to improve pain-free mobility/ walking.     Time 6   Period Weeks   Status New   PT LONG TERM GOAL #2   Title Pt. will complete LEFS and score >40 out of 80 to improve pain-free mobility.     Time 6   Period Weeks   Status New   PT LONG TERM GOAL #3   Title Pt. will ambulate community distance with more normalized  gait pattern and least assistive device safely.    Time 6   Period Weeks   Status New   PT LONG TERM GOAL #4   Title Pt. able to ascend/ descend 10 steps with recip. pattern and use of handrails safely.    Time 6   Period Weeks   Status New               Plan - 03/14/15 3825    Clinical Impression Statement Pt. continues to have high levels of hip pain and returns to MD tomorrow to discuss pain medications.  Pt. able to safely ambulate with SBA/CGA for cuing and proper technique with use of SPC.  Poor posture but able to correct with moderate cuing.     Pt will benefit from skilled therapeutic intervention in order to improve on the following deficits Abnormal gait;Decreased endurance;Hypomobility;Decreased scar mobility;Decreased activity tolerance;Decreased strength;Pain;Difficulty walking;Decreased mobility;Decreased  balance;Improper body mechanics;Decreased safety awareness   PT Frequency 2x / week   PT Duration 4 weeks   PT Treatment/Interventions ADLs/Self Care Home Management;Neuromuscular re-education;Aquatic Therapy;Passive range of motion;Patient/family education;Gait training;Stair training;Cryotherapy;Functional mobility training;Therapeutic activities;Therapeutic exercise;Balance training;Manual techniques   PT Next Visit Plan progress HEP/ gait training.  Reassess use of SPC   PT Home Exercise Plan continue with current HEP   Consulted and Agree with Plan of Care Patient        Problem List There are no active problems to display for this patient.   Pura Spice, PT, DPT # [phone]   03/14/2015, 8:44 AM  [facility] [address] [place], [name], [address] Phone: [phone]   Fax:  [phone]

## 2015-03-17 ENCOUNTER — Ambulatory Visit: Payer: Medicare Other | Admitting: Physical Therapy

## 2015-03-17 DIAGNOSIS — M6281 Muscle weakness (generalized): Secondary | ICD-10-CM

## 2015-03-17 DIAGNOSIS — R262 Difficulty in walking, not elsewhere classified: Secondary | ICD-10-CM

## 2015-03-17 DIAGNOSIS — M25552 Pain in left hip: Secondary | ICD-10-CM

## 2015-03-17 DIAGNOSIS — Z471 Aftercare following joint replacement surgery: Secondary | ICD-10-CM | POA: Diagnosis not present

## 2015-03-17 NOTE — Therapy (Signed)
[facility] [address] [place], [name], [address] Phone: [phone]   Fax:  [phone]  Physical Therapy Treatment  Patient Details  Name: Laura Walters MRN: [id number] Date of Birth: November 24, 1932 Referring Provider:  Dereck Leep, MD  Encounter Date: 03/17/2015      PT End of Session - 03/18/15 0743    Visit Number 4   Number of Visits 12   Date for PT Re-Evaluation 04/17/15   Authorization - Visit Number 4   Authorization - Number of Visits 10   PT Start Time 2376   PT Stop Time 1423   PT Time Calculation (min) 59 min   Equipment Utilized During Treatment Gait belt   Activity Tolerance Patient limited by fatigue;Patient limited by pain   Behavior During Therapy [facility] for tasks assessed/performed      No past medical history on file.  No past surgical history on file.  There were no vitals filed for this visit.  Visit Diagnosis:  Left hip pain  Muscle weakness  Difficulty walking      Subjective Assessment - 03/17/15 1344    Subjective Pt. reports no pain at rest and >5/10 L hip pain with prolonged sitting/ standing/ getting out of car.  Pt. entered PT with use of QC today.     Limitations Sitting;Lifting;Standing;Walking   Patient Stated Goals Increase L hip ROM/ strength and decrease pain with daily tasks/ walking.     Currently in Pain? Yes   Pain Score 5    Pain Location Hip   Pain Orientation Left         OBJECTIVE: Supine L hip SLR/ marching/ hip adduction with ball/ manual resisted hip abduction/ bridging 10x2 each (marked improvement with SLR without assist).  Step ups/overs on 3" plinths/ 6" step 20x. Walking in //-bars with use of R UE only and recip. Pattern and moderate cuing for posture/ heel strike/ gait pattern. Ambulate in //-bars/ clinic with use of SPC on R with gait instruction/ 2-point gait pattern.  Ambulate in //-bars with very light R finger touches and B UE hip flexion  forward/backwards 2x (mirror feedback). Nustep L5 10 min. B UE/LE (no charge)- no increase c/o pain.    Pt response for medical necessity: Verbal cuing and SBA/CGA required for proper gait pattern and safety with SPC. Marked improvement with SLR but able to safely ambulate with use of SPC and CGA with proper 2-point gait pattern. Maintains hip precautions t/o tx.          PT Long Term Goals - 03/07/15 1023    PT LONG TERM GOAL #1   Title Pt. I with HEP to increase L hip strength to grossly 4+/5 MMT to improve pain-free mobility/ walking.     Time 6   Period Weeks   Status New   PT LONG TERM GOAL #2   Title Pt. will complete LEFS and score >40 out of 80 to improve pain-free mobility.     Time 6   Period Weeks   Status New   PT LONG TERM GOAL #3   Title Pt. will ambulate community distance with more normalized gait pattern and least assistive device safely.    Time 6   Period Weeks   Status New   PT LONG TERM GOAL #4   Title Pt. able to ascend/ descend 10 steps with recip. pattern and use of handrails safely.    Time 6   Period Weeks  Status New               Plan - 03/18/15 0744    Clinical Impression Statement Pt. ambulates with slow, slight L antalgic gait pattern safely.  L hip stretches remain pain limited but increase flexibility noted.  Pt. able to complete 10 L SLR with min. to no assist from PT for first time since undergoing THA.      Pt will benefit from skilled therapeutic intervention in order to improve on the following deficits Abnormal gait;Decreased endurance;Hypomobility;Decreased scar mobility;Decreased activity tolerance;Decreased strength;Pain;Difficulty walking;Decreased mobility;Decreased balance;Improper body mechanics;Decreased safety awareness   Rehab Potential Good   PT Frequency 2x / week   PT Duration 4 weeks   PT Treatment/Interventions ADLs/Self Care Home Management;Neuromuscular re-education;Aquatic Therapy;Passive range  of motion;Patient/family education;Gait training;Stair training;Cryotherapy;Functional mobility training;Therapeutic activities;Therapeutic exercise;Balance training;Manual techniques   PT Next Visit Plan progress HEP/ gait training.  Reassess use of SPC   PT Home Exercise Plan continue with current HEP   Consulted and Agree with Plan of Care Patient        Problem List There are no active problems to display for this patient.   Pura Spice, PT, DPT # [phone]   03/18/2015, 7:47 AM  [place] [facility] [address] [place], [name], [address] Phone: [phone]   Fax:  [phone]

## 2015-03-20 ENCOUNTER — Ambulatory Visit: Payer: Medicare Other | Admitting: Physical Therapy

## 2015-03-20 DIAGNOSIS — M25552 Pain in left hip: Secondary | ICD-10-CM

## 2015-03-20 DIAGNOSIS — Z471 Aftercare following joint replacement surgery: Secondary | ICD-10-CM | POA: Diagnosis not present

## 2015-03-20 DIAGNOSIS — R262 Difficulty in walking, not elsewhere classified: Secondary | ICD-10-CM

## 2015-03-20 DIAGNOSIS — M6281 Muscle weakness (generalized): Secondary | ICD-10-CM

## 2015-03-20 NOTE — Therapy (Signed)
[place] [facility] [address]. [place], [name], [address] Phone: [phone]   Fax:  [phone]  Physical Therapy Treatment  Patient Details  Name: Laura Walters MRN: [id number] Date of Birth: 07-22-1933 Referring Provider:  Dereck Leep, MD  Encounter Date: 03/20/2015      PT End of Session - 03/21/15 0911    Visit Number 5   Number of Visits 12   Date for PT Re-Evaluation 04/17/15   Authorization - Visit Number 5   Authorization - Number of Visits 10   PT Start Time 1301   PT Stop Time 1351   PT Time Calculation (min) 50 min   Equipment Utilized During Treatment Gait belt   Activity Tolerance Patient limited by fatigue;Patient limited by pain   Behavior During Therapy [facility] for tasks assessed/performed      No past medical history on file.  No past surgical history on file.  There were no vitals filed for this visit.  Visit Diagnosis:  Left hip pain  Muscle weakness  Difficulty walking      Subjective Assessment - 03/21/15 0908    Subjective Pt. entered PT with use of QC and states she is using RW outside of house for safely.  Pt. reports 5/10 L hip pain currently in standing position.    Limitations Sitting;Lifting;Standing;Walking   Patient Stated Goals Increase L hip ROM/ strength and decrease pain with daily tasks/ walking.     Currently in Pain? Yes   Pain Score 5    Pain Location Hip   Pain Orientation Left       OBJECTIVE: There.ex.: 2# LAQ/ hip flexion/ standing hip flexion/ abd./ knee flexion/ heel raises 20x each (cuing for posture/ mirror feedback/ use of //-bars for balance).  Nustep L5 10 min. B UE/LE (no charge)- no increase c/o pain. Step ups/overs on 3" plinths/ 6" step 20x.  Gait training:  Recip. Stair climbing with use of SPC/ 1 handrail.   Sidestepping in //-bars with upright posture 10x2.  Walking in //-bars with use of R UE only and recip. Pattern and minimal cuing for posture/ heel strike/  gait pattern. Ambulate in //-bars/ clinic with use of SPC on R with gait instruction/ 2-point gait pattern.    Pt response for medical necessity: Verbal cuing and SBA/CGA required for proper gait pattern and safety with SPC. Cuing for posture/ progression in LE resisted ex.          PT Long Term Goals - 03/07/15 1023    PT LONG TERM GOAL #1   Title Pt. I with HEP to increase L hip strength to grossly 4+/5 MMT to improve pain-free mobility/ walking.     Time 6   Period Weeks   Status New   PT LONG TERM GOAL #2   Title Pt. will complete LEFS and score >40 out of 80 to improve pain-free mobility.     Time 6   Period Weeks   Status New   PT LONG TERM GOAL #3   Title Pt. will ambulate community distance with more normalized gait pattern and least assistive device safely.    Time 6   Period Weeks   Status New   PT LONG TERM GOAL #4   Title Pt. able to ascend/ descend 10 steps with recip. pattern and use of handrails safely.    Time 6   Period Weeks   Status New  Plan - 03/21/15 0912    Clinical Impression Statement Progressing well to use of QC or SPC on level surfaces with 2-point gait pattern.  L hip stiffness remains with increase hip ext. noted during R step through gait.    Pt will benefit from skilled therapeutic intervention in order to improve on the following deficits Abnormal gait;Decreased endurance;Hypomobility;Decreased scar mobility;Decreased activity tolerance;Decreased strength;Pain;Difficulty walking;Decreased mobility;Decreased balance;Improper body mechanics;Decreased safety awareness   Rehab Potential Good   PT Frequency 2x / week   PT Duration 4 weeks   PT Treatment/Interventions ADLs/Self Care Home Management;Neuromuscular re-education;Aquatic Therapy;Passive range of motion;Patient/family education;Gait training;Stair training;Cryotherapy;Functional mobility training;Therapeutic activities;Therapeutic exercise;Balance  training;Manual techniques   PT Next Visit Plan progress HEP/ gait training.  Reassess use of SPC   PT Home Exercise Plan continue with current HEP/ daily walking with use of SPC        Problem List There are no active problems to display for this patient.   Pura Spice, PT, DPT # [id number]   03/21/2015, 9:15 AM  [place] [facility] [address] [place], [name], [address] Phone: [phone]   Fax:  [phone]

## 2015-03-24 ENCOUNTER — Ambulatory Visit: Payer: Medicare Other | Admitting: Physical Therapy

## 2015-03-24 DIAGNOSIS — Z471 Aftercare following joint replacement surgery: Secondary | ICD-10-CM | POA: Diagnosis not present

## 2015-03-24 DIAGNOSIS — M25552 Pain in left hip: Secondary | ICD-10-CM

## 2015-03-24 DIAGNOSIS — M6281 Muscle weakness (generalized): Secondary | ICD-10-CM

## 2015-03-24 DIAGNOSIS — R262 Difficulty in walking, not elsewhere classified: Secondary | ICD-10-CM

## 2015-03-24 NOTE — Therapy (Signed)
[place] [facility] [address] [place], [name], [address] Phone: [phone]   Fax:  [phone]  Physical Therapy Treatment  Patient Details  Name: Laura Walters MRN: [id number] Date of Birth: 1933-01-07 Referring Provider:  Dereck Leep, MD  Encounter Date: 03/24/2015      PT End of Session - 03/24/15 1309    Visit Number 6   Number of Visits 12   Date for PT Re-Evaluation 04/17/15   Authorization - Visit Number 6   Authorization - Number of Visits 10   PT Start Time 5462   PT Stop Time 1401   PT Time Calculation (min) 57 min   Equipment Utilized During Treatment Gait belt   Activity Tolerance Patient limited by fatigue;Patient limited by pain   Behavior During Therapy [facility] for tasks assessed/performed      No past medical history on file.  No past surgical history on file.  There were no vitals filed for this visit.  Visit Diagnosis:  Left hip pain  Muscle weakness  Difficulty walking      Subjective Assessment - 03/24/15 1303    Subjective Pt. reports L groin pain over weekend after doing yardwork/ picking weeds on Saturday.  Pt. states she is better but still sore today and c/o 5/10 L hip discomfort.  Pt. states she slept well last night due to taking 2 hydrocodone before bedtime.     Limitations Sitting;Lifting;Standing;Walking   How long can you stand comfortably? <5 minutes   How long can you walk comfortably? pt. able to walk 100 feet comfortably with rollator.     Patient Stated Goals Increase L hip ROM/ strength and decrease pain with daily tasks/ walking.     Pain Score 5    Pain Location Hip   Pain Orientation Left     MD f/u July 12th with Dr. Marry Guan.  July 5th Dr. Doy Hutching.     OBJECTIVE:  Walking in //-bars with use of R UE forward/backward working on upright posture/ symmetry of shoulders.  Sidestepping in //-bars working on [employer] step pattern, esp. L hip flexion/abd. 5x each.  There.ex.: 2.5# LAQ/  hip flexion/ standing hip flexion/ abd./ knee flexion/ heel raises 20x each (cuing for posture/ mirror feedback/ use of //-bars for balance).  B LE 2.5#step overs on 3" plinths/ step ups 6" step 20x.  Nustep L5 10 min. B UE/LE (no charge)- no increase c/o pain.Supine LE stretches 5 min.  Gait training: Recip. Stair climbing with use of SPC/ 1 handrail. Sidestepping in //-bars with upright posture 10x2. Walking in //-bars with use of R UE only and recip. Pattern and minimal cuing for posture/ heel strike/ gait pattern. Ambulate in //-bars/ clinic with use of SPC on R with gait instruction/ 2-point gait pattern.    Pt response for medical necessity: Verbal cuing and SBA/CGA required for proper gait pattern and safety with SPC. Cuing for posture/ progression in LE resisted ex. Progression with wts.  Discomfort and stiffness in L hip with standing to sitting in chair.            PT Long Term Goals - 03/07/15 1023    PT LONG TERM GOAL #1   Title Pt. I with HEP to increase L hip strength to grossly 4+/5 MMT to improve pain-free mobility/ walking.     Time 6   Period Weeks   Status New   PT LONG TERM GOAL #2   Title Pt. will complete LEFS  and score >40 out of 80 to improve pain-free mobility.     Time 6   Period Weeks   Status New   PT LONG TERM GOAL #3   Title Pt. will ambulate community distance with more normalized gait pattern and least assistive device safely.    Time 6   Period Weeks   Status New   PT LONG TERM GOAL #4   Title Pt. able to ascend/ descend 10 steps with recip. pattern and use of handrails safely.    Time 6   Period Weeks   Status New         Problem List There are no active problems to display for this patient.   Pura Spice, PT, DPT # [id number]   03/25/2015, 10:11 AM  [place] [facility] [address] [place], [name], [address] Phone: [phone]   Fax:  [phone]

## 2015-03-27 ENCOUNTER — Ambulatory Visit: Payer: Medicare Other | Admitting: Physical Therapy

## 2015-03-27 ENCOUNTER — Inpatient Hospital Stay (HOSPITAL_BASED_OUTPATIENT_CLINIC_OR_DEPARTMENT_OTHER): Payer: Medicare Other | Admitting: Oncology

## 2015-03-27 ENCOUNTER — Other Ambulatory Visit: Payer: Self-pay | Admitting: *Deleted

## 2015-03-27 ENCOUNTER — Encounter: Payer: Self-pay | Admitting: Physical Therapy

## 2015-03-27 ENCOUNTER — Encounter: Payer: Self-pay | Admitting: Oncology

## 2015-03-27 ENCOUNTER — Inpatient Hospital Stay: Payer: Medicare Other | Attending: Oncology

## 2015-03-27 VITALS — BP 112/70 | HR 82 | Temp 96.9°F | Wt 132.1 lb

## 2015-03-27 DIAGNOSIS — C73 Malignant neoplasm of thyroid gland: Secondary | ICD-10-CM

## 2015-03-27 DIAGNOSIS — Z79899 Other long term (current) drug therapy: Secondary | ICD-10-CM | POA: Insufficient documentation

## 2015-03-27 DIAGNOSIS — Z85850 Personal history of malignant neoplasm of thyroid: Secondary | ICD-10-CM | POA: Diagnosis not present

## 2015-03-27 DIAGNOSIS — M25552 Pain in left hip: Secondary | ICD-10-CM

## 2015-03-27 DIAGNOSIS — R918 Other nonspecific abnormal finding of lung field: Secondary | ICD-10-CM | POA: Diagnosis not present

## 2015-03-27 DIAGNOSIS — G629 Polyneuropathy, unspecified: Secondary | ICD-10-CM | POA: Insufficient documentation

## 2015-03-27 DIAGNOSIS — R262 Difficulty in walking, not elsewhere classified: Secondary | ICD-10-CM

## 2015-03-27 DIAGNOSIS — M818 Other osteoporosis without current pathological fracture: Secondary | ICD-10-CM | POA: Insufficient documentation

## 2015-03-27 DIAGNOSIS — Z471 Aftercare following joint replacement surgery: Secondary | ICD-10-CM | POA: Diagnosis not present

## 2015-03-27 DIAGNOSIS — M6281 Muscle weakness (generalized): Secondary | ICD-10-CM

## 2015-03-27 LAB — COMPREHENSIVE METABOLIC PANEL
ALBUMIN: 3.6 g/dL (ref 3.5–5.0)
ALK PHOS: 171 U/L — AB (ref 38–126)
ALT: 16 U/L (ref 14–54)
AST: 26 U/L (ref 15–41)
Anion gap: 7 (ref 5–15)
BUN: 19 mg/dL (ref 6–20)
CALCIUM: 8 mg/dL — AB (ref 8.9–10.3)
CO2: 29 mmol/L (ref 22–32)
Chloride: 96 mmol/L — ABNORMAL LOW (ref 101–111)
Creatinine, Ser: 0.85 mg/dL (ref 0.44–1.00)
GFR calc Af Amer: >60 mL/min (ref >60)
GFR calc non Af Amer: >60 mL/min (ref >60)
Glucose, Bld: 111 mg/dL — ABNORMAL HIGH (ref 65–99)
POTASSIUM: 3.7 mmol/L (ref 3.5–5.1)
Sodium: 132 mmol/L — ABNORMAL LOW (ref 135–145)
TOTAL PROTEIN: 7.4 g/dL (ref 6.5–8.1)
Total Bilirubin: 0.3 mg/dL (ref 0.3–1.2)

## 2015-03-27 LAB — URINALYSIS COMPLETE WITH MICROSCOPIC (ARMC ONLY)
Bilirubin Urine: NEGATIVE
Glucose, UA: NEGATIVE mg/dL
Hgb urine dipstick: NEGATIVE
Ketones, ur: NEGATIVE mg/dL
Nitrite: NEGATIVE
PH: 7 (ref 5.0–8.0)
PROTEIN: NEGATIVE mg/dL
Specific Gravity, Urine: 1.005 (ref 1.005–1.030)

## 2015-03-27 LAB — CBC WITH DIFFERENTIAL/PLATELET
Basophils Absolute: 0 10*3/uL (ref 0–0.1)
Basophils Relative: 1 %
EOS PCT: 11 %
Eosinophils Absolute: 0.8 10*3/uL — ABNORMAL HIGH (ref 0–0.7)
HCT: 36.7 % (ref 35.0–47.0)
HEMOGLOBIN: 11.8 g/dL — AB (ref 12.0–16.0)
Lymphocytes Relative: 15 %
Lymphs Abs: 1.1 10*3/uL (ref 1.0–3.6)
MCH: 26.9 pg (ref 26.0–34.0)
MCHC: 32.2 g/dL (ref 32.0–36.0)
MCV: 83.6 fL (ref 80.0–100.0)
Monocytes Absolute: 0.9 10*3/uL (ref 0.2–0.9)
Monocytes Relative: 12 %
Neutro Abs: 4.8 10*3/uL (ref 1.4–6.5)
Neutrophils Relative %: 63 %
PLATELETS: 347 10*3/uL (ref 150–440)
RBC: 4.39 MIL/uL (ref 3.80–5.20)
RDW: 17.3 % — ABNORMAL HIGH (ref 11.5–14.5)
WBC: 7.6 10*3/uL (ref 3.6–11.0)

## 2015-03-27 LAB — IRON AND TIBC
IRON: 38 ug/dL (ref 28–170)
Saturation Ratios: 14 % (ref 10.4–31.8)
TIBC: 282 ug/dL (ref 250–450)
UIBC: 244 ug/dL

## 2015-03-27 LAB — TSH: TSH: 0.239 u[IU]/mL — ABNORMAL LOW (ref 0.350–4.500)

## 2015-03-27 LAB — FERRITIN: Ferritin: 62 ng/mL (ref 11–307)

## 2015-03-27 NOTE — Therapy (Signed)
[facility] [address] [place], [name], [address] Phone: [phone]   Fax:  [phone]  Physical Therapy Treatment  Patient Details  Name: Laura Walters MRN: [id number] Date of Birth: [birth year] Referring Provider:  Dereck Leep, MD  Encounter Date: 03/27/2015      PT End of Session - 03/27/15 1400    Visit Number 7   Number of Visits 12   Date for PT Re-Evaluation 04/17/15   Authorization - Visit Number 7   Authorization - Number of Visits 10   PT Start Time 7026   PT Stop Time 1401   PT Time Calculation (min) 58 min   Activity Tolerance Patient limited by fatigue;Patient limited by pain   Behavior During Therapy [facility] for tasks assessed/performed      History reviewed. No pertinent past medical history.  History reviewed. No pertinent past surgical history.  There were no vitals filed for this visit.  Visit Diagnosis:  Left hip pain  Muscle weakness  Difficulty walking      Subjective Assessment - 03/27/15 1301    Subjective Pt. states L groin remains and c/o lower abdominal discomfort (?UTI).  Pt. saw MD this morning.  Pt. has been active with pulling weeds/ yard work with use of QC.   Limitations Sitting;Lifting;Standing;Walking   How long can you stand comfortably? <5 minutes   How long can you walk comfortably? pt. able to walk 100 feet comfortably with rollator.     Patient Stated Goals Increase L hip ROM/ strength and decrease pain with daily tasks/ walking.     Currently in Pain? Yes   Pain Score 6    Pain Location Hip   Pain Orientation Left;Anterior   Pain Type Chronic pain      Berg balance test: 44/56 (recommend use of SPC with inside/outside walking).     MD f/u July 12th with Dr. Marry Guan. July 5th Dr. Doy Hutching.    OBJECTIVE: Neuro mm.: Berg balance test (44/56)- difficulty with tandem stance/ SLS/ turning slowly.  BOSU step ups/ recip. Step overs with L LE and B UE assist 10x.  There.ex.:   Nustep L6 10 min. B UE/LE (no charge/ no rest breaks).  2.5# LAQ/ hip flexion/ standing hip flexion/ abd./ knee flexion/ walking around PT clinic 20x each (cuing for posture/ mirror feedback/ use of QC for balance). Supine LE stretches 6 min. Gait training:  Walking in //-bars with use of R UE only and recip. pattern and minimal cuing for posture/ heel strike/ gait pattern. Ambulate in clinic/ hallway with use of SPC on R with gait instruction/ 2-point gait pattern (moderate cuing to improve upright posture/ head position).    Pt response for medical necessity: Verbal cuing and SBA/CGA required for proper gait pattern and safety with SPC. Cuing for posture/ progression in LE resisted ex. Progression with wts.          PT Long Term Goals - 03/07/15 1023    PT LONG TERM GOAL #1   Title Pt. I with HEP to increase L hip strength to grossly 4+/5 MMT to improve pain-free mobility/ walking.     Time 6   Period Weeks   Status New   PT LONG TERM GOAL #2   Title Pt. will complete LEFS and score >40 out of 80 to improve pain-free mobility.     Time 6   Period Weeks   Status New   PT LONG TERM GOAL #3  Title Pt. will ambulate community distance with more normalized gait pattern and least assistive device safely.    Time 6   Period Weeks   Status New   PT LONG TERM GOAL #4   Title Pt. able to ascend/ descend 10 steps with recip. pattern and use of handrails safely.    Time 6   Period Weeks   Status New               Plan - 03/27/15 1401    Clinical Impression Statement Pt. continues to have difficulty with initial steps after standing with use of QC/SPC.  L anterior groin/hip discomfort t/o tx. session with tenderness noted during palpation.  Pt. completed Berg balance test and will continue to benefit from use of QC or SPC with walking for safety.     Pt will benefit from skilled therapeutic intervention in order to improve on the following deficits Abnormal  gait;Decreased endurance;Hypomobility;Decreased scar mobility;Decreased activity tolerance;Decreased strength;Pain;Difficulty walking;Decreased mobility;Decreased balance;Improper body mechanics;Decreased safety awareness   Rehab Potential Good   PT Frequency 2x / week   PT Duration 4 weeks   PT Treatment/Interventions ADLs/Self Care Home Management;Neuromuscular re-education;Aquatic Therapy;Passive range of motion;Patient/family education;Gait training;Stair training;Cryotherapy;Functional mobility training;Therapeutic activities;Therapeutic exercise;Balance training;Manual techniques   PT Next Visit Plan Increase B LE muscle strength/ balance tasks.  Check Thomas test on L.     PT Home Exercise Plan continue with current HEP/ daily walking with use of SPC   Consulted and Agree with Plan of Care Patient        Problem List There are no active problems to display for this patient.  Pura Spice, PT, DPT # [id number]   03/27/2015, 2:03 PM  [place] [facility] [address] [place], [name], [address] Phone: [phone]   Fax:  [phone]

## 2015-03-27 NOTE — Progress Notes (Signed)
Patient does having will.  Never smoked. Pt left urine specimen in the lab. Thinks she has an infection.  Does not want Cipro if she does.

## 2015-03-28 LAB — THYROGLOBULIN LEVEL

## 2015-03-28 LAB — T4: T4, Total: 7.5 ug/dL (ref 4.5–12.0)

## 2015-03-28 LAB — MISC LABCORP TEST (SEND OUT): LABCORP TEST CODE: 6685

## 2015-03-29 LAB — URINE CULTURE

## 2015-03-30 ENCOUNTER — Encounter: Payer: Self-pay | Admitting: Oncology

## 2015-03-30 DIAGNOSIS — C73 Malignant neoplasm of thyroid gland: Secondary | ICD-10-CM | POA: Insufficient documentation

## 2015-03-30 DIAGNOSIS — R0789 Other chest pain: Secondary | ICD-10-CM | POA: Insufficient documentation

## 2015-03-30 DIAGNOSIS — G629 Polyneuropathy, unspecified: Secondary | ICD-10-CM | POA: Insufficient documentation

## 2015-03-30 DIAGNOSIS — M545 Low back pain, unspecified: Secondary | ICD-10-CM | POA: Insufficient documentation

## 2015-03-30 DIAGNOSIS — D497 Neoplasm of unspecified behavior of endocrine glands and other parts of nervous system: Secondary | ICD-10-CM | POA: Insufficient documentation

## 2015-03-30 HISTORY — DX: Malignant neoplasm of thyroid gland: C73

## 2015-03-30 NOTE — Progress Notes (Signed)
[place] @ [facility] [phone]  Fax:([phone]) [place]: 1933-07-13  MR#: [id number]  IOE#:[id number]  Patient Care Team: Idelle Crouch, MD as PCP - General (Internal Medicine)  CHIEF COMPLAINT:  Chief Complaint  Patient presents with  . Follow-up    Oncology History   Chief Complaint/Problem List  1.  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  Status post thyroidectomy and lymph node dissection in December 2003.  2.  Status post I-131 therapy in February 2004. 3.lung nodule some CT scan (May, 2012) 4.PET scan has been positive to the lung nodules and supraclavicular lymph node (September, 2012) 5.biopsy from supraclavicular lymph node was positive for  malignancy consistent with thyroid cancer 6.patient has received I-131 treatment in mid December, 2012        Cancer of thyroid   03/30/2015 Initial Diagnosis Cancer of thyroid    No flowsheet data found.  INTERVAL HISTORY:  81 year old lady with stage IV carcinoma of thyroid.Came today further follow-up.  In the interim.  she has had left hip replacement.  Avascular necrosis of the left hip joint was found. Is gradually recovering: Undergoing rehabilitation therapy No cough or shortness of breath.  Appetite has been stable.  Patient continues to have severe numbness in lower extremity  REVIEW OF SYSTEMS:   Gen. status: Patient is recovering from the left hip surgery HEENT: No headache.  No dizziness.  No difficulty swallowing Cardiac: No chest pain Lungs: No cough or shortness of breath GI: Nausea no vomiting no diarrhea GU: No dysuria or hematuria Lower extremity no swelling Neurological system no headache no dizziness As per HPI. Otherwise, a complete review of systems is negatve.  PAST MEDICAL HISTORY: Past Medical History  Diagnosis Date  . Cancer of thyroid 03/30/2015    ificant History/PMH:   cholesterol:    Allergic Rhinitis:    Osteoporosis:    thyroid Cancer:      breast biospy:    Thyroidectomy:    Pituitary Surgery:   PFSH: Additional Past Medical and Surgical History: Past Medical History   Hemorrhoids.   Acute bronchitis.    Past Surgical History   Had previous history of breast surgery, negative for any malignancy.    Ovarian cyst removed, benign.    Family History   No family history of colorectal cancer, breast cancer, or ovarian cancer.    Social History   Does not smoke.  Does not drink alcohol.   ADVANCED DIRECTIVES:  Patient does have a living will and does not want to make any changes at present time  HEALTH MAINTENANCE: History  Substance Use Topics  . Smoking status: Never Smoker   . Smokeless tobacco: Not on file  . Alcohol Use: Not on file      Allergies  Allergen Reactions  . Aleve [Naproxen]   . Codeine Hives and Itching  . Penicillins Hives  . Sulfa Antibiotics Hives    Current Outpatient Prescriptions  Medication Sig Dispense Refill  . HYDROcodone-acetaminophen (NORCO) 10-325 MG per tablet Take by mouth.    Marland Kitchen acetaminophen (TYLENOL) 500 MG tablet Take 500 mg by mouth every 4 (four) hours as needed for fever.    Marland Kitchen alum & mag hydroxide-simeth (MAALOX PLUS) 400-400-40 MG/5ML suspension Take 30 mLs by mouth every 6 (six) hours as needed for indigestion (or heartburn).    . bisacodyl (DULCOLAX) 10 MG suppository Place 10 mg rectally daily as needed (constipation if no results with MOM).    Marland Kitchen  carvedilol (COREG) 3.125 MG tablet Take 3.125 mg by mouth 2 (two) times daily with a meal.    . DULoxetine (CYMBALTA) 60 MG capsule Take 60 mg by mouth daily.    Marland Kitchen enoxaparin (LOVENOX) 30 MG/0.3ML injection Inject 30 mg into the skin every 12 (twelve) hours. For 14 days then d/c and begin taking one 81 mg EC ASA per day unless contraindicated    . fentaNYL (DURAGESIC - DOSED MCG/HR) 12 MCG/HR Place 12 mcg onto the skin every 3 (three) days.    . ferrous sulfate 325 (65 FE) MG tablet Take 325 mg by mouth 2 (two) times  daily with a meal.    . furosemide (LASIX) 20 MG tablet Take 40 mg by mouth daily.    Marland Kitchen gabapentin (NEURONTIN) 400 MG capsule Take 400 mg by mouth 4 (four) times daily.    Marland Kitchen HYDROcodone-acetaminophen (NORCO/VICODIN) 5-325 MG per tablet Take 1-2 tablets by mouth See admin instructions. Every 4-6 hours for moderate to severe pain    . ipratropium-albuterol (DUONEB) 0.5-2.5 (3) MG/3ML SOLN Take 3 mLs by nebulization. Inhaled    . levothyroxine (SYNTHROID, LEVOTHROID) 100 MCG tablet Take 100 mcg by mouth daily before breakfast.    . MAGNESIUM HYDROXIDE PO Take 30 mLs by mouth 2 (two) times daily as needed (constipation). 8% oral suspension    . omeprazole (PRILOSEC) 40 MG capsule Take 40 mg by mouth daily.    Marland Kitchen senna-docusate (SENOKOT-S) 8.6-50 MG per tablet Take 1 tablet by mouth 2 (two) times daily.    Marland Kitchen spironolactone (ALDACTONE) 25 MG tablet Take 25 mg by mouth daily. If weight increases by 3 pounds     No current facility-administered medications for this visit.    OBJECTIVE:  Filed Vitals:   03/27/15 1132  BP: 112/70  Pulse: 82  Temp: 96.9 F (36.1 C)     Body mass index is 25.79 kg/(m^2).    ECOG FS:2 - Symptomatic, <50% confined to bed  PHYSICAL EXAM: Gen. status: Patient is alert oriented gradually recovering from the hip surgery HEENT: Thyroid within normal limit.  No palpable masses.  No palpable lymph node Lymphatic system: Supraclavicular, cervical, axillary, inguinal lymph nodes are not palpable Examination of the chest was unremarkable. There were no bony deformities, no asymmetry, and no other abnormalities. Abdominal exam revealed normal bowel sounds. The abdomen was soft, non-tender, and without masses, organomegaly, or appreciable enlargement of the abdominal aorta. Examination of the skin revealed no evidence of significant rashes, suspicious appearing nevi or other concerning lesions. Neurological system:: Peripheral neuropathy   LAB RESULTS:  Outpatient Rehab on  03/27/2015  Component Date Value Ref Range Status  . [facility] test code 03/27/2015 6685   Final  . [facility] test name 03/27/2015 THYROGLOBULIN   Final  . Misc [facility] result 03/27/2015 COMMENT   Final   Comment: (NOTE) Test Ordered: [id number] Thyroglobulin Antibody Thyroglobulin Antibody         <1.0             IU/mL    BN     Reference Range: 0.0-0.9                               Thyroglobulin Antibody measured by [facility] Methodology Performed At: [facility] [place], [name] [id number] Lindon Romp MD DS:[id number]   Appointment on 03/27/2015  Component Date Value Ref Range Status  . WBC 03/27/2015 7.6  3.6 - 11.0 K/uL Final  . RBC 03/27/2015 4.39  3.80 - 5.20 MIL/uL Final  . Hemoglobin 03/27/2015 11.8* 12.0 - 16.0 g/dL Final  . HCT 03/27/2015 36.7  35.0 - 47.0 % Final  . MCV 03/27/2015 83.6  80.0 - 100.0 fL Final  . MCH 03/27/2015 26.9  26.0 - 34.0 pg Final  . MCHC 03/27/2015 32.2  32.0 - 36.0 g/dL Final  . RDW 03/27/2015 17.3* 11.5 - 14.5 % Final  . Platelets 03/27/2015 347  150 - 440 K/uL Final  . Neutrophils Relative % 03/27/2015 63   Final  . Neutro Abs 03/27/2015 4.8  1.4 - 6.5 K/uL Final  . Lymphocytes Relative 03/27/2015 15   Final  . Lymphs Abs 03/27/2015 1.1  1.0 - 3.6 K/uL Final  . Monocytes Relative 03/27/2015 12   Final  . Monocytes Absolute 03/27/2015 0.9  0.2 - 0.9 K/uL Final  . Eosinophils Relative 03/27/2015 11   Final  . Eosinophils Absolute 03/27/2015 0.8* 0 - 0.7 K/uL Final  . Basophils Relative 03/27/2015 1   Final  . Basophils Absolute 03/27/2015 0.0  0 - 0.1 K/uL Final  . Sodium 03/27/2015 132* 135 - 145 mmol/L Final  . Potassium 03/27/2015 3.7  3.5 - 5.1 mmol/L Final  . Chloride 03/27/2015 96* 101 - 111 mmol/L Final  . CO2 03/27/2015 29  22 - 32 mmol/L Final  . Glucose, Bld 03/27/2015 111* 65 - 99 mg/dL Final  . BUN 03/27/2015 19  6 - 20 mg/dL Final  . Creatinine, Ser 03/27/2015 0.85  0.44 - 1.00 mg/dL Final  .  Calcium 03/27/2015 8.0* 8.9 - 10.3 mg/dL Final  . Total Protein 03/27/2015 7.4  6.5 - 8.1 g/dL Final  . Albumin 03/27/2015 3.6  3.5 - 5.0 g/dL Final  . AST 03/27/2015 26  15 - 41 U/L Final  . ALT 03/27/2015 16  14 - 54 U/L Final  . Alkaline Phosphatase 03/27/2015 171* 38 - 126 U/L Final  . Total Bilirubin 03/27/2015 0.3  0.3 - 1.2 mg/dL Final  . GFR calc non Af Amer 03/27/2015 >60  >60 mL/min Final  . GFR calc Af Amer 03/27/2015 >60  >60 mL/min Final   Comment: (NOTE) The eGFR has been calculated using the CKD EPI equation. This calculation has not been validated in all clinical situations. eGFR's persistently <60 mL/min signify possible Chronic Kidney Disease.   . Anion gap 03/27/2015 7  5 - 15 Final  . Ferritin 03/27/2015 62  11 - 307 ng/mL Final  . Iron 03/27/2015 38  28 - 170 ug/dL Final  . TIBC 03/27/2015 282  250 - 450 ug/dL Final  . Saturation Ratios 03/27/2015 14  10.4 - 31.8 % Final  . UIBC 03/27/2015 244   Final  . T4, Total 03/27/2015 7.5  4.5 - 12.0 ug/dL Final   Comment: (NOTE) Performed At: [facility] [place], [name] [id number] Lindon Romp MD IO:[id number]   . TSH 03/27/2015 0.239* 0.350 - 4.500 uIU/mL Final  . Thyroglobulin 03/27/2015 PROBLEM WITH ORDER MAPPING   Final   SEE [id number] FOR REPORT  . Color, Urine 03/27/2015 STRAW* YELLOW Final  . APPearance 03/27/2015 CLEAR* CLEAR Final  . Glucose, UA 03/27/2015 NEGATIVE  NEGATIVE mg/dL Final  . Bilirubin Urine 03/27/2015 NEGATIVE  NEGATIVE Final  . Ketones, ur 03/27/2015 NEGATIVE  NEGATIVE mg/dL Final  . Specific Gravity, Urine 03/27/2015 1.005  1.005 - 1.030 Final  . Hgb urine dipstick 03/27/2015 NEGATIVE  NEGATIVE Final  .  pH 03/27/2015 7.0  5.0 - 8.0 Final  . Protein, ur 03/27/2015 NEGATIVE  NEGATIVE mg/dL Final  . Nitrite 03/27/2015 NEGATIVE  NEGATIVE Final  . Leukocytes, UA 03/27/2015 1+* NEGATIVE Final  . RBC / HPF 03/27/2015 0-5  0 - 5 RBC/hpf Final  . WBC, UA 03/27/2015 0-5   0 - 5 WBC/hpf Final  . Bacteria, UA 03/27/2015 RARE* NONE SEEN Final  . Squamous Epithelial / LPF 03/27/2015 0-5* NONE SEEN Final  . Hyaline Casts, UA 03/27/2015 PRESENT   Final  . Specimen Description 03/27/2015 URINE, CLEAN CATCH   Final  . Special Requests 03/27/2015 NONE   Final  . Culture 03/27/2015 MULTIPLE SPECIES PRESENT, SUGGEST RECOLLECTION IF CLINICALLY INDICATED   Final  . 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  Avascular necrosis Peripheral neuropathy  MEDICAL DECISION MAKING:  All lab data has been reviewed.  Patient had some complaints of urinary frequency but cultures sent urine analysis is within normal limit. Continue follow-up with the same dose of Synthroid There is no evidence of recurrent or progressive disease   Patient expressed understanding and was in agreement with this plan. She also understands that She can call clinic at any time with any questions, concerns, or complaints.    No matching staging information was found for the patient.  Forest Gleason, MD   03/30/2015 5:17 PM

## 2015-04-01 ENCOUNTER — Ambulatory Visit: Payer: Medicare Other | Attending: Orthopedic Surgery | Admitting: Physical Therapy

## 2015-04-01 DIAGNOSIS — M6281 Muscle weakness (generalized): Secondary | ICD-10-CM | POA: Diagnosis not present

## 2015-04-01 DIAGNOSIS — M25552 Pain in left hip: Secondary | ICD-10-CM | POA: Diagnosis present

## 2015-04-01 DIAGNOSIS — R262 Difficulty in walking, not elsewhere classified: Secondary | ICD-10-CM

## 2015-04-01 NOTE — Therapy (Signed)
[facility] [address] [place], [name], [address] Phone: [phone]   Fax:  [phone]  Physical Therapy Treatment  Patient Details  Name: Laura Walters MRN: [id number] Date of Birth: [birth year] Referring Provider:  Dereck Leep, MD  Encounter Date: 04/01/2015      PT End of Session - 04/02/15 1106    Visit Number 8   Number of Visits 12   Date for PT Re-Evaluation 04/17/15   Authorization - Visit Number 8   Authorization - Number of Visits 10   PT Start Time 4193   PT Stop Time 7902   PT Time Calculation (min) 60 min   Equipment Utilized During Treatment Gait belt   Activity Tolerance Patient limited by fatigue;Patient limited by pain   Behavior During Therapy [facility] for tasks assessed/performed      Past Medical History  Diagnosis Date  . Cancer of thyroid 03/30/2015    No past surgical history on file.  There were no vitals filed for this visit.  Visit Diagnosis:  Muscle weakness  Left hip pain  Difficulty walking      Subjective Assessment - 04/01/15 1305    Subjective Pt. reports 4/10 L groin pain and states her leg pain is better today.  Pt. concerned about R hip/LE this weekend.  No lower abdominal discomfort.  Pt. is planning on starting to take Lyrica soon for nerve pain.     Limitations Sitting;Lifting;Standing;Walking   How long can you stand comfortably? <5 minutes   How long can you walk comfortably? pt. able to walk 250 feet with QC safely on a level surfaces   Patient Stated Goals Increase L hip ROM/ strength and decrease pain with daily tasks/ walking.     Currently in Pain? Yes   Pain Score 4    Pain Location Hip   Pain Orientation Left;Anterior       MD f/u July 14th with Dr. Marry Guan.   OBJECTIVE: There.ex.:  Step ups/downs on 6" step 10x L/R (min. UE assist). Nustep L6 10 min. B UE/LE (no charge/ no rest breaks). 2.5# LAQ/ hip flexion/ standing hip flexion/ abd./ knee flexion/ heel  raises 20x each (cuing for posture/ mirror feedback/ use of QC for balance) and walking with wts. In PT clinic working on increase hip/knee flexion.  Supine on blue mat table LE stretches/ added hip flexor/ Thomas stretches 11 min. Gait training: Walking in //-bars with use of R UE only and recip. pattern and minimal cuing for posture/ heel strike/ gait pattern. Ambulate in clinic/ hallway with use of SPC on R with gait instruction/ 2-point gait pattern (moderate cuing to improve upright posture/ head position).    Pt response for medical necessity: Verbal cuing and SBA/CGA required for proper gait pattern and safety with SPC. Cuing for posture/ progression in LE resisted ex. Progression with wts.(-) Thomas test.          PT Long Term Goals - 03/07/15 1023    PT LONG TERM GOAL #1   Title Pt. I with HEP to increase L hip strength to grossly 4+/5 MMT to improve pain-free mobility/ walking.     Time 6   Period Weeks   Status New   PT LONG TERM GOAL #2   Title Pt. will complete LEFS and score >40 out of 80 to improve pain-free mobility.     Time 6   Period Weeks   Status New   PT LONG TERM GOAL #  3   Title Pt. will ambulate community distance with more normalized gait pattern and least assistive device safely.    Time 6   Period Weeks   Status New   PT LONG TERM GOAL #4   Title Pt. able to ascend/ descend 10 steps with recip. pattern and use of handrails safely.    Time 6   Period Weeks   Status New               Plan - 04/02/15 1107    Clinical Impression Statement Pt. continues to benefit from QC or [facility] with all aspects of walking.  Pt. ambulates with slow, slight antalgic gait pattern with intial difficulty with first several steps.  No LPB during tx. session but extra time to stand from chair.  Progressing wiith LE resisted ex. program wtih no increase c/o pain, but moderate fatigue noted.   (-) Thomas test.     Pt will benefit from skilled therapeutic  intervention in order to improve on the following deficits Abnormal gait;Decreased endurance;Hypomobility;Decreased scar mobility;Decreased activity tolerance;Decreased strength;Pain;Difficulty walking;Decreased mobility;Decreased balance;Improper body mechanics;Decreased safety awareness   Rehab Potential Good   PT Frequency 2x / week   PT Duration 4 weeks   PT Next Visit Plan Increase B LE muscle strength/ balance tasks.   UPDATE GOALS.     PT Home Exercise Plan continue with current HEP/ daily walking with use of [facility]        Problem List Patient Active Problem List   Diagnosis Date Noted  . LBP (low back pain) 03/30/2015  . Neuropathy 03/30/2015  . Chest pain, non-cardiac 03/30/2015  . Neoplasm of pituitary gland 03/30/2015  . Cancer of thyroid 03/30/2015  . Combined fat and carbohydrate induced hyperlipemia 10/17/2014    Pura Spice, PT, DPT # [phone]   04/02/2015, 11:11 AM  [place] [facility] [address] [place], [name], [address] Phone: [phone]   Fax:  [phone]

## 2015-04-03 ENCOUNTER — Encounter: Payer: Self-pay | Admitting: Physical Therapy

## 2015-04-03 ENCOUNTER — Ambulatory Visit: Payer: Medicare Other | Admitting: Physical Therapy

## 2015-04-03 DIAGNOSIS — M6281 Muscle weakness (generalized): Secondary | ICD-10-CM

## 2015-04-03 DIAGNOSIS — M25552 Pain in left hip: Secondary | ICD-10-CM

## 2015-04-03 DIAGNOSIS — R262 Difficulty in walking, not elsewhere classified: Secondary | ICD-10-CM

## 2015-04-03 NOTE — Therapy (Signed)
[facility] [address] [place], [name], [address] Phone: [phone]   Fax:  [phone]  Physical Therapy Treatment  Patient Details  Name: Laura Walters MRN: [id number] Date of Birth: July 19, 1933 Referring Provider:  Dereck Leep, MD  Encounter Date: 04/03/2015      PT End of Session - 04/03/15 1305    Visit Number 9   Number of Visits 12   Date for PT Re-Evaluation 04/17/15   Authorization - Visit Number 9   Authorization - Number of Visits 19   PT Start Time 8309   PT Stop Time 1401   PT Time Calculation (min) 56 min   Equipment Utilized During Treatment Gait belt   Activity Tolerance Patient limited by fatigue;Patient limited by pain   Behavior During Therapy [facility] for tasks assessed/performed      Past Medical History  Diagnosis Date  . Cancer of thyroid 03/30/2015    History reviewed. No pertinent past surgical history.  There were no vitals filed for this visit.  Visit Diagnosis:  Muscle weakness  Left hip pain  Difficulty walking      Subjective Assessment - 04/03/15 1303    Subjective Pt. reports less groing pain but 3/10 B knee pain with walking/ Nustep.  Pt. reports no issues since starting Lyrica.     Limitations Sitting;Lifting;Standing;Walking   Patient Stated Goals Increase L hip ROM/ strength and decrease pain with daily tasks/ walking.     Currently in Pain? Yes   Pain Score 3    Pain Location Knee   Pain Orientation Right;Left      MD f/u July 14th with Dr. Marry Guan.   LEFS: 29/80 on 04/03/15  OBJECTIVE: There.ex.: Step ups/downs on 6" step 10x L/R (min. UE assist)- trying to ascend with no UE assist but difficult.  Lateral step ups with 1 UE assist L/R 15x each (mirror feedback for posture correction). Nustep L6 10 min. B LE only (no charge/ no rest breaks)- no increase c/o pain.  Standing hip flexion/ abd./ ext. With no UE assist and no wt. (mirror feedback).  Gait training: Walking in  //-bars with use of R UE only and progressing to no UE assist and recip. pattern and minimal cuing for posture/ heel strike/ gait pattern. Ambulate in clinic/ hallway with no assistive device and gait instruction to increase heel strike/ hip and knee flexion.  Neuro mm.: amb. In hallway with CGA and alt. UE/LE touches, head turning with no assistive device (recognizing numbers on wall).  Tandem stance/ walking in //-bars with 1 UE assist for safety.     Pt response for medical necessity: Verbal cuing and SBA/CGA required for proper gait pattern with no assistive device.  Pt. Continues to benefit from [facility] with mod. I gait but is progressing to more independent mobility.  Improved tandem stance         PT Long Term Goals - 04/03/15 1335    PT LONG TERM GOAL #1   Title Pt. I with HEP to increase L hip strength to grossly 4+/5 MMT to improve pain-free mobility/ walking.     Time 6   Period Weeks   Status Partially Met   PT LONG TERM GOAL #2   Title Pt. will complete LEFS and score >40 out of 80 to improve pain-free mobility.     Baseline 29/80 on 04/03/15   Time 6   Period Weeks   Status Not Met   PT LONG TERM  GOAL #3   Title Pt. will ambulate community distance with more normalized gait pattern and least assistive device safely.    Time 6   Period Weeks   Status Partially Met   PT LONG TERM GOAL #4   Title Pt. able to ascend/ descend 10 steps with recip. pattern and use of handrails safely.    Time 6   Period Weeks   Status Partially Met             Plan - 04/18/2015 1333    Clinical Impression Statement Improved gait pattern with no assistive device with increase hip/knee flexion with no LOB.  Pt. ambulates with consistent heel strike while walking in hallway with no shuffling noted.  Pt. will continue to benefit from [facility] with mod. I gait and safety.  No increase c/o hip or groiin pain during tx. session.     Pt will benefit from skilled therapeutic intervention in  order to improve on the following deficits Abnormal gait;Decreased endurance;Hypomobility;Decreased scar mobility;Decreased activity tolerance;Decreased strength;Pain;Difficulty walking;Decreased mobility;Decreased balance;Improper body mechanics;Decreased safety awareness   PT Frequency 2x / week   PT Duration 4 weeks   PT Treatment/Interventions ADLs/Self Care Home Management;Neuromuscular re-education;Aquatic Therapy;Passive range of motion;Patient/family education;Gait training;Stair training;Cryotherapy;Functional mobility training;Therapeutic activities;Therapeutic exercise;Balance training;Manual techniques   PT Next Visit Plan Increase B LE muscle strength/ balance tasks.     PT Home Exercise Plan continue with current HEP/ daily walking with use of SPC   Consulted and Agree with Plan of Care Patient          G-Codes - 18-Apr-2015 1401    Functional Limitation Mobility: Walking and moving around   Mobility: Walking and Moving Around Current Status [phone]) At least 40 percent but less than 60 percent impaired, limited or restricted   Mobility: Walking and Moving Around Goal Status [phone]) At least 20 percent but less than 40 percent impaired, limited or restricted      Problem List Patient Active Problem List   Diagnosis Date Noted  . LBP (low back pain) 03/30/2015  . Neuropathy 03/30/2015  . Chest pain, non-cardiac 03/30/2015  . Neoplasm of pituitary gland 03/30/2015  . Cancer of thyroid 03/30/2015  . Combined fat and carbohydrate induced hyperlipemia 10/17/2014   Pura Spice, PT, DPT # [phone]   04-18-15, 2:05 PM  [place] [facility] [address] [place], [name], [address] Phone: [phone]   Fax:  [phone]

## 2015-04-07 ENCOUNTER — Ambulatory Visit: Payer: Medicare Other | Admitting: Physical Therapy

## 2015-04-07 DIAGNOSIS — M6281 Muscle weakness (generalized): Secondary | ICD-10-CM

## 2015-04-07 DIAGNOSIS — R262 Difficulty in walking, not elsewhere classified: Secondary | ICD-10-CM

## 2015-04-07 DIAGNOSIS — M25552 Pain in left hip: Secondary | ICD-10-CM

## 2015-04-07 NOTE — Therapy (Signed)
[facility] [address] [place], [name], [address] Phone: [phone]   Fax:  [phone]  Physical Therapy Treatment  Patient Details  Name: Laura Walters MRN: [id number] Date of Birth: 1933-01-09 Referring Provider:  Dereck Leep, MD  Encounter Date: 04/07/2015      PT End of Session - 04/07/15 1423    Visit Number 10   Number of Visits 12   Date for PT Re-Evaluation 04/17/15   Authorization - Visit Number 10   Authorization - Number of Visits 19   PT Start Time 3716   PT Stop Time 1402   PT Time Calculation (min) 63 min   Equipment Utilized During Treatment Gait belt   Activity Tolerance Patient limited by fatigue;Patient limited by pain   Behavior During Therapy [facility] for tasks assessed/performed      Past Medical History  Diagnosis Date  . Cancer of thyroid 03/30/2015    No past surgical history on file.  There were no vitals filed for this visit.  Visit Diagnosis:  Muscle weakness  Left hip pain  Difficulty walking      Subjective Assessment - 04/07/15 1420    Subjective Pt. states R hip is sore/hurting today and has c/o generalized B LE muscle soreness all weekend.  Pt. continues to take Lyrica and reports she is staying active with yard work/ household chores.  Pt. is still taking pain medications as prescribed.  Pt. returns to Dr. Marry Guan for f/u on 04/10/15.   Limitations Sitting;Lifting;Standing;Walking   How long can you stand comfortably? <5 minutes   How long can you walk comfortably? pt. able to walk 250 feet with QC safely on a level surfaces   Patient Stated Goals Increase L hip ROM/ strength and decrease pain with daily tasks/ walking.     Currently in Pain? Yes   Pain Score 5    Pain Location Knee   Pain Orientation Right;Left   Pain Descriptors / Indicators Aching   Pain Type Chronic pain      MD f/u July 14th with Dr. Marry Guan.   OBJECTIVE:   Gait training: Walking in //-bars with use of  R UE only and progressing to no UE assist and recip. pattern and minimal cuing for posture/ heel strike/ gait pattern. Ambulate in clinic/ hallway with no assistive device and gait instruction to increase heel strike/ hip and knee flexion. Neuro mm.: Static standing with EO/EC >20 sec. Each.  Obstacle course in clinic (step ups/ overs/ cone maneuvering/ sidestepping) with use of QC, then [facility] (unable to complete with no assistive device safely).  Cone touches with L/R LE outside of //-bars with 1 UE progression to no UE assist (proprioception for CGA for feedback/safety).  There.ex.: Step ups/downs on 6" step 15x L/R (min. UE assist)- R knee discomfort and requires use of B UE for assist.  Nustep L6 10 min. B LE only (no charge/ no rest breaks)- L hip discomfort with initial steps after Nustep. Standing hip flexion with no UE assist and no ankle wts. (mirror feedback) in //-bars forward/ backwards 5x. Supine L hip stretches at end of tx. (pain tolerable range with static holds). Pt response for medical necessity: Verbal cuing and SBA/CGA required for proper gait pattern with no assistive device. Pt. Continues to benefit from [facility] with mod. I gait but is progressing to more independent mobility. Improved L hip/ LE wt. Bearing with step pattern but difficulty with initial steps after standing.  PT Long Term Goals - 04/03/15 1335    PT LONG TERM GOAL #1   Title Pt. I with HEP to increase L hip strength to grossly 4+/5 MMT to improve pain-free mobility/ walking.     Time 6   Period Weeks   Status Partially Met   PT LONG TERM GOAL #2   Title Pt. will complete LEFS and score >40 out of 80 to improve pain-free mobility.     Baseline 29/80 on 04/03/15   Time 6   Period Weeks   Status Not Met   PT LONG TERM GOAL #3   Title Pt. will ambulate community distance with more normalized gait pattern and least assistive device safely.    Time 6   Period Weeks   Status Partially Met    PT LONG TERM GOAL #4   Title Pt. able to ascend/ descend 10 steps with recip. pattern and use of handrails safely.    Time 6   Period Weeks   Status Partially Met            Plan - 04/07/15 1423    Clinical Impression Statement Use of SPC on R with 2-point gait pattern worked well today during obstacle course/ step ups/downs.  B LE/knee soreness and discomfort with all walking tasks/ hip flexion in //-bars.  Difficulty with initial aspects of gait pattern after prolonged supine/ sitting tasks.     Pt will benefit from skilled therapeutic intervention in order to improve on the following deficits Abnormal gait;Decreased endurance;Hypomobility;Decreased scar mobility;Decreased activity tolerance;Decreased strength;Pain;Difficulty walking;Decreased mobility;Decreased balance;Improper body mechanics;Decreased safety awareness   Rehab Potential Good   PT Frequency 2x / week   PT Duration 4 weeks   PT Treatment/Interventions ADLs/Self Care Home Management;Neuromuscular re-education;Aquatic Therapy;Passive range of motion;Patient/family education;Gait training;Stair training;Cryotherapy;Functional mobility training;Therapeutic activities;Therapeutic exercise;Balance training;Manual techniques   PT Next Visit Plan Increase B LE muscle strength/ balance tasks.     PT Home Exercise Plan continue with current HEP/ daily walking with use of SPC   Consulted and Agree with Plan of Care Patient        Problem List Patient Active Problem List   Diagnosis Date Noted  . LBP (low back pain) 03/30/2015  . Neuropathy 03/30/2015  . Chest pain, non-cardiac 03/30/2015  . Neoplasm of pituitary gland 03/30/2015  . Cancer of thyroid 03/30/2015  . Combined fat and carbohydrate induced hyperlipemia 10/17/2014   Pura Spice, PT, DPT # [phone]   04/08/2015, 10:52 AM  [facility] [address] [place], [name], [address] Phone: [phone]   Fax:   [phone]

## 2015-04-10 ENCOUNTER — Encounter: Payer: Medicare Other | Admitting: Physical Therapy

## 2015-04-10 ENCOUNTER — Ambulatory Visit: Payer: Medicare Other | Admitting: Physical Therapy

## 2015-04-10 DIAGNOSIS — R262 Difficulty in walking, not elsewhere classified: Secondary | ICD-10-CM

## 2015-04-10 DIAGNOSIS — M6281 Muscle weakness (generalized): Secondary | ICD-10-CM

## 2015-04-10 DIAGNOSIS — M25552 Pain in left hip: Secondary | ICD-10-CM

## 2015-04-10 NOTE — Therapy (Signed)
[facility] [address] [place], [name], [address] Phone: [phone]   Fax:  [phone]  Physical Therapy Treatment  Patient Details  Name: Laura Walters MRN: [id number] Date of Birth: 1933-04-11 Referring Provider:  Dereck Leep, MD  Encounter Date: 04/10/2015      PT End of Session - 04/10/15 1232    Visit Number 11   Number of Visits 12   Date for PT Re-Evaluation 04/17/15   Authorization - Visit Number 11   Authorization - Number of Visits 19   PT Start Time 1112   PT Stop Time 1202   PT Time Calculation (min) 50 min   Equipment Utilized During Treatment Gait belt   Activity Tolerance Patient limited by fatigue;Patient limited by pain   Behavior During Therapy [facility] for tasks assessed/performed      Past Medical History  Diagnosis Date  . Cancer of thyroid 03/30/2015    No past surgical history on file.  There were no vitals filed for this visit.  Visit Diagnosis:  Muscle weakness  Left hip pain  Difficulty walking      Subjective Assessment - 04/10/15 1134    Subjective Pt. reports 7/10 R knee pain and 5/10 L knee pain and no hip pain at this time.  Pt. is wearing an icy hot patch on lumbar region for pain control with prolonged sitting/ standing tasks.  Pt. states she has been walking better over past week and entered PT gym with use of SPC with wider base.     Limitations Sitting;Lifting;Standing;Walking   How long can you stand comfortably? <5 minutes   How long can you walk comfortably? pt. able to walk 250 feet with SPC safely on a level surfaces   Diagnostic tests x-ray   Patient Stated Goals Increase L hip ROM/ strength and decrease pain with daily tasks/ walking.     Currently in Pain? Yes   Pain Score 7    Pain Location Knee   Pain Orientation Right;Left   Pain Descriptors / Indicators Aching   Pain Type Chronic pain   Pain Radiating Towards pt. may discuss knee pain with Dr. Marry Guan this  afternoon.         MD f/u July 14th with Dr. Marry Guan.   OBJECTIVE: Gait training: Walking in //-bars with use of R UE only and progressing to no UE assist and recip. pattern and minimal cuing for posture/ heel strike/ gait pattern. Ambulate in clinic/ hallway with use of SPC and progressing to no assistive device.  Gait instruction to increase heel strike/ hip flexion and knee flexion (L hip flexion limited as compared to R)- slow but controlled balacne. There.ex.: Reassessed B LE MMT (hip flexor weakness grossly 4/5 MMT)- R knee discomfort and requires use of B UE for assist with stairs. Nustep L6 10 min. B LE only (no charge/ no rest breaks)- R knee discomfort.  Standing hip flexion with no UE assist and no ankle wts. (mirror feedback) in //-bars forward/ backwards 5x. Supine L hip stretches at end of tx. (pain tolerable range with static holds).  Tandem walking in //-bars with mirror feedback.  Discussed HEP.  Pt response for medical necessity: Verbal cuing and SBA required for proper gait pattern with use of SPC, progressing to no assistive device. Pt. Continues to benefit from [facility] with mod. I gait but is progressing to more independent mobility.R knee discomfort limiting recip. Step pattern on stairs.  PT Long Term Goals - 04/10/15 1121    PT LONG TERM GOAL #1   Title Pt. I with HEP to increase L hip strength to grossly 4+/5 MMT to improve pain-free mobility/ walking.     Baseline B LE muscle strength grossly 5/5 MMT except hip flexion 4/5 MMT.     Time 6   Period Weeks   Status Partially Met   PT LONG TERM GOAL #2   Title Pt. will complete LEFS and score >40 out of 80 to improve pain-free mobility.     Baseline 29/80 on 04/03/15   Time 6   Period Weeks   Status Partially Met   PT LONG TERM GOAL #3   Title Pt. will ambulate community distance with more normalized gait pattern and least assistive device safely.    Baseline SPC   Time 6   Period Weeks    Status Partially Met   PT LONG TERM GOAL #4   Title Pt. able to ascend/ descend 10 steps with recip. pattern and use of handrails safely.    Baseline Step to pattern due to R knee pain/joint stiffness   Time 6   Period Weeks   Status Partially Met            Plan - 04/10/15 1142    Clinical Impression Statement Increase R knee swelling as compared to L noted today while pt. was sitting on Nustep.  No warmth or redness noted.  Pt. has not tried ice to R knee at this time and was advised in benefits of ice.  Pt. report no known reason for increase swelling.  Pt. ascends/descend steps with step to pattern secondary to R knee pain/ joint stiffness, esp. with attempting to descend with L LE first.  Pt. continues to benefit from use of SPC on R with 2-point gait pattern.  Pt. ambulates with slow but controlled gait pattern community distances at local stores.  L hip flexion/ step pattern remains limited as compared to R hip/LE.  Pt. will continue to benefit from skilled PT services to incresae L hip ROM/ strength to improve gait/ independence/ overall mobility at home.        Pt will benefit from skilled therapeutic intervention in order to improve on the following deficits Abnormal gait;Decreased endurance;Hypomobility;Decreased scar mobility;Decreased activity tolerance;Decreased strength;Pain;Difficulty walking;Decreased mobility;Decreased balance;Improper body mechanics;Decreased safety awareness   Rehab Potential Good   PT Frequency 2x / week   PT Duration 4 weeks   PT Treatment/Interventions ADLs/Self Care Home Management;Neuromuscular re-education;Aquatic Therapy;Passive range of motion;Patient/family education;Gait training;Stair training;Cryotherapy;Functional mobility training;Therapeutic activities;Therapeutic exercise;Balance training;Manual techniques   PT Next Visit Plan Increase B LE muscle strength/ balance tasks.  Discuss MD appt.     PT Home Exercise Plan continue with current  HEP/ daily walking with use of SPC   Consulted and Agree with Plan of Care Patient        Problem List Patient Active Problem List   Diagnosis Date Noted  . LBP (low back pain) 03/30/2015  . Neuropathy 03/30/2015  . Chest pain, non-cardiac 03/30/2015  . Neoplasm of pituitary gland 03/30/2015  . Cancer of thyroid 03/30/2015  . Combined fat and carbohydrate induced hyperlipemia 10/17/2014   Pura Spice, PT, DPT # [phone]   04/10/2015, 12:40 PM  [facility] [address] [place], [name], [address] Phone: [phone]   Fax:  [phone]

## 2015-04-14 ENCOUNTER — Other Ambulatory Visit: Payer: Self-pay | Admitting: Orthopedic Surgery

## 2015-04-14 ENCOUNTER — Ambulatory Visit: Payer: Medicare Other | Admitting: Physical Therapy

## 2015-04-14 ENCOUNTER — Encounter: Payer: Self-pay | Admitting: Physical Therapy

## 2015-04-14 DIAGNOSIS — M25561 Pain in right knee: Secondary | ICD-10-CM

## 2015-04-14 DIAGNOSIS — M6281 Muscle weakness (generalized): Secondary | ICD-10-CM

## 2015-04-14 DIAGNOSIS — R262 Difficulty in walking, not elsewhere classified: Secondary | ICD-10-CM

## 2015-04-14 DIAGNOSIS — M25552 Pain in left hip: Secondary | ICD-10-CM

## 2015-04-14 NOTE — Therapy (Signed)
[place] [facility] [address] [place], [name], [address] Phone: [phone]   Fax:  [phone]  Physical Therapy Treatment  Patient Details  Name: Laura Walters MRN: [id number] Date of Birth: 1933/01/10 Referring Provider:  Dereck Leep, MD  Encounter Date: 04/14/2015      PT End of Session - 04/14/15 1313    Visit Number 12   Number of Visits 12   Date for PT Re-Evaluation 04/17/15   Authorization - Visit Number 12   Authorization - Number of Visits 19   PT Start Time 8938   PT Stop Time 1411   PT Time Calculation (min) 57 min   Equipment Utilized During Treatment Gait belt   Activity Tolerance Patient limited by fatigue;Patient limited by pain   Behavior During Therapy [facility] for tasks assessed/performed      Past Medical History  Diagnosis Date  . Cancer of thyroid 03/30/2015    History reviewed. No pertinent past surgical history.  There were no vitals filed for this visit.  Visit Diagnosis:  Muscle weakness  Left hip pain  Difficulty walking      Subjective Assessment - 04/14/15 1307    Subjective Pt. states MD appt. went well.  L hip is doing well but R knee pain may be result of meniscal tear.  R knee X-ray space tooked good.  Pt. is having MRI on R knee soon (to be scheduled).  No R knee or L hip pain at this time.     Limitations Sitting;Lifting;Standing;Walking   How long can you stand comfortably? <5 minutes   How long can you walk comfortably? pt. able to walk 250 feet with SPC safely on a level surfaces   Diagnostic tests MRI planned on R knee to r/o soft tissue/ meniscal injury.     Patient Stated Goals Increase L hip ROM/ strength and decrease pain with daily tasks/ walking.     Currently in Pain? Yes   Pain Score 3    Pain Location Knee   Pain Orientation Left       OBJECTIVE:There.ex.: Nustep L6 10 min. B LE only (no charge/ no rest breaks).  Walking around PT clinic working on gait pattern with  use of QC (warm-up).  Standing cone touches/ sidestepping with posture feedback/ decrease hip flexion due to R knee pain in //-bars with mirror feedback.  Supine B LE stretches 13 min. Tandem walking in //-bars with mirror feedback.  Knee to chest with white ball/ bridging (some back discomfort)- 20x each.  Unable to don/doff L sock in sitting position but able to manage R sock.  Sidestepping/ forward/ backwards walking.  Gait training: Walking in //-bars with use of R UE only and progressing to no UE assist and recip. pattern and minimal cuing for posture/ heel strike/ gait pattern. Ambulate in clinic/ hallway with use of SPC and progressing to no assistive device. Gait instruction to increase heel strike/ hip flexion and knee flexion (L hip flexion limited as compared to R)- slow but controlled balacne.  Pt response for medical necessity: Verbal cuing and SBA required for proper gait pattern with use of SPC, progressing to no assistive device. Good overall tx. Progression with endurance and LE strengthening.           PT Long Term Goals - 04/10/15 1121    PT LONG TERM GOAL #1   Title Pt. I with HEP to increase L hip strength to grossly 4+/5 MMT to improve  pain-free mobility/ walking.     Baseline B LE muscle strength grossly 5/5 MMT except hip flexion 4/5 MMT.     Time 6   Period Weeks   Status Partially Met   PT LONG TERM GOAL #2   Title Pt. will complete LEFS and score >40 out of 80 to improve pain-free mobility.     Baseline 29/80 on 04/03/15   Time 6   Period Weeks   Status Partially Met   PT LONG TERM GOAL #3   Title Pt. will ambulate community distance with more normalized gait pattern and least assistive device safely.    Baseline SPC   Time 6   Period Weeks   Status Partially Met   PT LONG TERM GOAL #4   Title Pt. able to ascend/ descend 10 steps with recip. pattern and use of handrails safely.    Baseline Step to pattern due to R knee pain/joint stiffness    Time 6   Period Weeks   Status Partially Met           Plan - 04/14/15 1404    Clinical Impression Statement 2 short seated rest breaks required during ther.ex. tx. today and improved overall mobility during tx.  Slight increase in R knee pain with sit to stands/ hip flexion to 90 deg. in standing.  Pt. ambulates with more consistent step pattern with use of SPC during tx. and PT instructed pt. to stop using QC and progress to SPC/ Hurrycane.  Decrease overall c/o groin pain with sidestepping in //-bar.  Pt. requires light UE touch of //-bars during tandem stance/ gait.     Pt will benefit from skilled therapeutic intervention in order to improve on the following deficits Abnormal gait;Decreased endurance;Hypomobility;Decreased scar mobility;Decreased activity tolerance;Decreased strength;Pain;Difficulty walking;Decreased mobility;Decreased balance;Improper body mechanics;Decreased safety awareness   Rehab Potential Good   PT Frequency 2x / week   PT Duration 4 weeks   PT Treatment/Interventions ADLs/Self Care Home Management;Neuromuscular re-education;Aquatic Therapy;Passive range of motion;Patient/family education;Gait training;Stair training;Cryotherapy;Functional mobility training;Therapeutic activities;Therapeutic exercise;Balance training;Manual techniques   PT Next Visit Plan Increase B LE muscle strength/ balance tasks. Check on MRI for R knee.   PT Home Exercise Plan continue with current HEP/ daily walking with use of SPC   Consulted and Agree with Plan of Care Patient        Problem List Patient Active Problem List   Diagnosis Date Noted  . LBP (low back pain) 03/30/2015  . Neuropathy 03/30/2015  . Chest pain, non-cardiac 03/30/2015  . Neoplasm of pituitary gland 03/30/2015  . Cancer of thyroid 03/30/2015  . Combined fat and carbohydrate induced hyperlipemia 10/17/2014   Pura Spice, PT, DPT # [phone]   04/14/2015, 2:22 PM  [facility] [address] [place], [name], [address] Phone: [phone]   Fax:  [phone]

## 2015-04-17 ENCOUNTER — Ambulatory Visit: Payer: Medicare Other | Admitting: Physical Therapy

## 2015-04-17 ENCOUNTER — Encounter: Payer: Self-pay | Admitting: Physical Therapy

## 2015-04-17 DIAGNOSIS — M6281 Muscle weakness (generalized): Secondary | ICD-10-CM | POA: Diagnosis not present

## 2015-04-17 DIAGNOSIS — M25552 Pain in left hip: Secondary | ICD-10-CM

## 2015-04-17 DIAGNOSIS — R262 Difficulty in walking, not elsewhere classified: Secondary | ICD-10-CM

## 2015-04-18 ENCOUNTER — Ambulatory Visit
Admission: RE | Admit: 2015-04-18 | Discharge: 2015-04-18 | Disposition: A | Discharge status: Home / Self Care | Payer: Medicare Other | Source: Ambulatory Visit | Attending: Orthopedic Surgery | Admitting: Orthopedic Surgery

## 2015-04-18 DIAGNOSIS — M659 Synovitis and tenosynovitis, unspecified: Secondary | ICD-10-CM | POA: Diagnosis not present

## 2015-04-18 DIAGNOSIS — S83281A Other tear of lateral meniscus, current injury, right knee, initial encounter: Secondary | ICD-10-CM | POA: Diagnosis not present

## 2015-04-18 DIAGNOSIS — S83206A Unspecified tear of unspecified meniscus, current injury, right knee, initial encounter: Secondary | ICD-10-CM | POA: Diagnosis not present

## 2015-04-18 DIAGNOSIS — M25461 Effusion, right knee: Secondary | ICD-10-CM | POA: Insufficient documentation

## 2015-04-18 DIAGNOSIS — M948X6 Other specified disorders of cartilage, lower leg: Secondary | ICD-10-CM | POA: Diagnosis not present

## 2015-04-18 DIAGNOSIS — M25561 Pain in right knee: Secondary | ICD-10-CM

## 2015-04-18 NOTE — Therapy (Signed)
[facility] [address] [place], [name], [address] Phone: [phone]   Fax:  [phone]  Physical Therapy Treatment  Patient Details  Name: Laura Walters MRN: [id number] Date of Birth: 1933/06/05 Referring Provider:  Dereck Leep, MD  Encounter Date: 04/17/2015      PT End of Session - 04/17/15 1303    Visit Number 13   Number of Visits 19   Date for PT Re-Evaluation 05/22/15   Authorization - Visit Number 13   Authorization - Number of Visits 19   PT Start Time 1301   PT Stop Time 1356   PT Time Calculation (min) 55 min   Equipment Utilized During Treatment Gait belt   Activity Tolerance Patient limited by fatigue;Patient limited by pain   Behavior During Therapy [facility] for tasks assessed/performed      Past Medical History  Diagnosis Date  . Cancer of thyroid 03/30/2015    History reviewed. No pertinent past surgical history.  There were no vitals filed for this visit.  Visit Diagnosis:  Muscle weakness  Left hip pain  Difficulty walking      Subjective Assessment - 04/17/15 1300    Subjective Pt. reports a fall yesterday morning after standing up from edge of bed with no use of assistive device resulting in a bruise on R arm/ back of head.  Pt. reports 6/10 pain in L LE.  Pt. is having MRI tomorrow morning for R knee to determine ?mensical tear.       Limitations Sitting;Lifting;Standing;Walking   How long can you stand comfortably? <5 minutes   How long can you walk comfortably? pt. able to walk 250 feet with SPC safely on a level surfaces   Diagnostic tests MRI planned on R knee to r/o soft tissue/ meniscal injury.     Patient Stated Goals Increase L hip ROM/ strength and decrease pain with daily tasks/ walking.     Currently in Pain? Yes   Pain Score 6    Pain Location Knee   Pain Orientation Left      OBJECTIVE:There.ex.: Nustep L6 10 min. B LE only (no charge/ no rest breaks)- good warm-up with no  hip pain reported. Walking around PT clinic working on gait pattern with use of SPC (warm-up) focusing on L hip flexion/ heel strike. Standing cone touches/ sidestepping with posture feedback/ decrease hip flexion due to R knee pain in //-bars with mirror feedback. Supine B LE stretches 14 min. Tandem walking in //-bars with mirror feedback. Supine L hip SLR (difficulty today secondary to groin pain).  Gait training: Walking in //-bars with use of R UE only and progressing to no UE assist and recip. pattern and minimal cuing for posture/ heel strike/ gait pattern. Ambulate in clinic/ hallway with use of SPC and progressing to no assistive device. Gait instruction to increase heel strike/ hip flexion and knee flexion (L hip flexion limited as compared to R)- slow but controlled balacne.  Pt response for medical necessity: Verbal cuing and SBA required for proper gait pattern with use of SPC, progressing to no assistive device. Difficulty remains with initial step after standing due to L hip discomfort/ fear of wt. Bearing during initial gait pattern with or without assistive device.           PT Long Term Goals - 04/17/15 1326    PT LONG TERM GOAL #1   Title Pt. I with HEP to increase L hip strength to grossly  4+/5 MMT to improve pain-free mobility/ walking.     Baseline B LE muscle strength grossly 5/5 MMT except hip flexion 4/5 MMT.     Time 4   Period Weeks   Status Partially Met   PT LONG TERM GOAL #2   Title Pt. will complete LEFS and score >40 out of 80 to improve pain-free mobility.     Baseline 29/80 on 04/03/15   Time 4   Period Weeks   Status Partially Met   PT LONG TERM GOAL #3   Title Pt. will ambulate community distance with more normalized gait pattern and least assistive device safely.    Baseline SPC   Time 4   Period Weeks   Status Partially Met   PT LONG TERM GOAL #4   Title Pt. able to ascend/ descend 10 steps with recip. pattern and use of handrails  safely.    Baseline Step to pattern due to R knee pain/joint stiffness   Time 4   Period Weeks   Status Partially Met               Plan - Apr 26, 2015 1317    Clinical Impression Statement Active L hip ROM causes increase L groin pain today.  Difficulty completing SLR without PT assist due to hip flexor/adductor muscle soreness.  No brusining on L hip and pt. reports not falling or injuring L side.  PT advised calling MD if L hip discomfort continues and pt. is scheduled for R knee MRI tomorrow morning.  No progression with ther.ex. today secondary to increase discomfort and recent fall.  No palpable pain or tenderness in L LE.      Pt will benefit from skilled therapeutic intervention in order to improve on the following deficits Abnormal gait;Decreased endurance;Hypomobility;Decreased scar mobility;Decreased activity tolerance;Decreased strength;Pain;Difficulty walking;Decreased mobility;Decreased balance;Improper body mechanics;Decreased safety awareness   Rehab Potential Good   PT Frequency 2x / week   PT Duration 4 weeks   PT Treatment/Interventions ADLs/Self Care Home Management;Neuromuscular re-education;Aquatic Therapy;Passive range of motion;Patient/family education;Gait training;Stair training;Cryotherapy;Functional mobility training;Therapeutic activities;Therapeutic exercise;Balance training;Manual techniques   PT Next Visit Plan Increase B LE muscle strength/ balance tasks. Check on MRI for R knee.   PT Home Exercise Plan continue with current HEP/ daily walking with use of SPC   Consulted and Agree with Plan of Care Patient          G-Codes - 04/26/15 1654    Functional Assessment Tool Used LEFS/ gait pattern/ pain/ clinical judgement   Functional Limitation Mobility: Walking and moving around   Mobility: Walking and Moving Around Current Status ([id number]) At least 40 percent but less than 60 percent impaired, limited or restricted   Mobility: Walking and Moving Around Goal  Status [phone]) At least 20 percent but less than 40 percent impaired, limited or restricted      Problem List Patient Active Problem List   Diagnosis Date Noted  . LBP (low back pain) 03/30/2015  . Neuropathy 03/30/2015  . Chest pain, non-cardiac 03/30/2015  . Neoplasm of pituitary gland 03/30/2015  . Cancer of thyroid 03/30/2015  . Combined fat and carbohydrate induced hyperlipemia 10/17/2014   Pura Spice, PT, DPT # [id number]   04/18/2015, 4:56 PM  [place] [facility] [address] [place], [name], [address] Phone: [phone]   Fax:  [phone]

## 2015-04-21 ENCOUNTER — Ambulatory Visit: Payer: Medicare Other | Admitting: Physical Therapy

## 2015-04-23 ENCOUNTER — Ambulatory Visit: Payer: Medicare Other | Admitting: Physical Therapy

## 2015-04-23 DIAGNOSIS — R262 Difficulty in walking, not elsewhere classified: Secondary | ICD-10-CM

## 2015-04-23 DIAGNOSIS — M6281 Muscle weakness (generalized): Secondary | ICD-10-CM

## 2015-04-23 DIAGNOSIS — M25552 Pain in left hip: Secondary | ICD-10-CM

## 2015-04-23 NOTE — Therapy (Signed)
[facility] [address] [place], [name], [address] Phone: [phone]   Fax:  [phone]  Physical Therapy Treatment  Patient Details  Name: Laura Walters MRN: [id number] Date of Birth: November 14, 1932 Referring Provider:  Dereck Leep, MD  Encounter Date: 04/23/2015      PT End of Session - 04/24/15 2024    Visit Number 14   Number of Visits 19   Date for PT Re-Evaluation 05/22/15   Authorization - Visit Number 14   Authorization - Number of Visits 19   PT Start Time 1302   PT Stop Time 1400   PT Time Calculation (min) 58 min   Equipment Utilized During Treatment Gait belt   Activity Tolerance Patient limited by fatigue;Patient limited by pain   Behavior During Therapy [facility] for tasks assessed/performed      Past Medical History  Diagnosis Date  . Cancer of thyroid 03/30/2015    No past surgical history on file.  There were no vitals filed for this visit.  Visit Diagnosis:  Muscle weakness  Left hip pain  Difficulty walking      Subjective Assessment - 04/23/15 1304    Subjective Pt. reports 6/10 L groin pain and no hip pain currently on Nustep.  Pt. reports minimal R knee discomfort with initial standing from car and walking.  Pt. states "Hydrocodone is not helping the groin pain like it was".  Pt. reports walking/ remaining active at home but hasn't been working in yard.  Pt. had MRI and returns to Dr. Marry Guan tomorrow for results.      Limitations Sitting;Lifting;Standing;Walking   How long can you stand comfortably? <5 minutes   How long can you walk comfortably? pt. able to walk 250 feet with SPC safely on a level surfaces   Diagnostic tests MRI completed on R knee 7/22   Patient Stated Goals Increase L hip ROM/ strength and decrease pain with daily tasks/ walking.     Pain Score 6    Pain Location Groin   Pain Orientation Left   Pain Type Chronic pain      OBJECTIVE:There.ex.: Nustep L6 10 min. B LE only  (no charge/ no rest breaks)- good warm-up with no hip pain reported. Manual tx.: Supine B LE stretches 15 min.  STM to L hip and assessment of R knee mobility/pain. Tandem walking in //-bars with mirror feedback. Supine L hip SLR (difficulty due to groin pain). Gait training: Walking in //-bars with use of R UE only and progressing to no UE assist and recip. pattern and minimal cuing for posture/ heel strike/ gait pattern. Ambulate in clinic/ hallway with use of SPC and progressing to no assistive device. Gait instruction to increase heel strike/ hip flexion and knee flexion (L hip flexion limited as compared to R)- slow but controlled balance.    Pt response for medical necessity: Verbal cuing and SBA required for proper gait pattern with use of SPC, progressing to no assistive device. Difficulty remains with initial step after standing due to L hip discomfort/ fear of wt. Bearing during initial gait pattern with or without assistive device. 1 LOB with self-correction with UE assist///-bars.         PT Long Term Goals - 04/17/15 1326    PT LONG TERM GOAL #1   Title Pt. I with HEP to increase L hip strength to grossly 4+/5 MMT to improve pain-free mobility/ walking.     Baseline B LE muscle strength  grossly 5/5 MMT except hip flexion 4/5 MMT.     Time 4   Period Weeks   Status Partially Met   PT LONG TERM GOAL #2   Title Pt. will complete LEFS and score >40 out of 80 to improve pain-free mobility.     Baseline 29/80 on 04/03/15   Time 4   Period Weeks   Status Partially Met   PT LONG TERM GOAL #3   Title Pt. will ambulate community distance with more normalized gait pattern and least assistive device safely.    Baseline SPC   Time 4   Period Weeks   Status Partially Met   PT LONG TERM GOAL #4   Title Pt. able to ascend/ descend 10 steps with recip. pattern and use of handrails safely.    Baseline Step to pattern due to R knee pain/joint stiffness   Time 4   Period  Weeks   Status Partially Met               Plan - 04/24/15 2025    Clinical Impression Statement Difficulty with active L hip flexion in sitting/ standing posture as compared to R hip.  Persistent L groin discomfort with walking/ supine manual stretches.  Pt. had 1 LOB with walking in PT clinic with use of hurrycane and pt. able to independently prevent fall with UE assist/ .//-bars.     Pt will benefit from skilled therapeutic intervention in order to improve on the following deficits Abnormal gait;Decreased endurance;Hypomobility;Decreased scar mobility;Decreased activity tolerance;Decreased strength;Pain;Difficulty walking;Decreased mobility;Decreased balance;Improper body mechanics;Decreased safety awareness   Rehab Potential Good   PT Frequency 2x / week   PT Duration 4 weeks   PT Treatment/Interventions ADLs/Self Care Home Management;Neuromuscular re-education;Aquatic Therapy;Passive range of motion;Patient/family education;Gait training;Stair training;Cryotherapy;Functional mobility training;Therapeutic activities;Therapeutic exercise;Balance training;Manual techniques   PT Next Visit Plan Increase B LE muscle strength/ balance tasks. Discuss MD f/u appt. with Dr. Marry Guan.    PT Home Exercise Plan continue with current HEP/ daily walking with use of SPC   Consulted and Agree with Plan of Care Patient        Problem List Patient Active Problem List   Diagnosis Date Noted  . LBP (low back pain) 03/30/2015  . Neuropathy 03/30/2015  . Chest pain, non-cardiac 03/30/2015  . Neoplasm of pituitary gland 03/30/2015  . Cancer of thyroid 03/30/2015  . Combined fat and carbohydrate induced hyperlipemia 10/17/2014   Pura Spice, PT, DPT # [id number]   04/24/2015, 8:32 PM  [place] [facility] [address] [place], [name], [address] Phone: [phone]   Fax:  [phone]

## 2015-04-28 ENCOUNTER — Ambulatory Visit: Payer: Medicare Other | Attending: Orthopedic Surgery | Admitting: Physical Therapy

## 2015-04-28 DIAGNOSIS — M6281 Muscle weakness (generalized): Secondary | ICD-10-CM

## 2015-04-28 DIAGNOSIS — R262 Difficulty in walking, not elsewhere classified: Secondary | ICD-10-CM | POA: Insufficient documentation

## 2015-04-28 DIAGNOSIS — M25552 Pain in left hip: Secondary | ICD-10-CM | POA: Diagnosis present

## 2015-04-28 NOTE — Therapy (Signed)
[facility] [address] [place], [name], [address] Phone: [phone]   Fax:  [phone]  Physical Therapy Treatment  Patient Details  Name: Laura Walters MRN: [id number] Date of Birth: 03-19-1933 Referring Provider:  Dereck Leep, MD  Encounter Date: 04/28/2015      PT End of Session - 04/28/15 1640    Visit Number 15   Number of Visits 19   Date for PT Re-Evaluation 05/22/15   Authorization - Visit Number 15   Authorization - Number of Visits 19   PT Start Time 6283   PT Stop Time 1517   PT Time Calculation (min) 57 min   Equipment Utilized During Treatment Gait belt   Activity Tolerance Patient limited by fatigue;Patient limited by pain   Behavior During Therapy [facility] for tasks assessed/performed      Past Medical History  Diagnosis Date  . Cancer of thyroid 03/30/2015    No past surgical history on file.  There were no vitals filed for this visit.  Visit Diagnosis:  Muscle weakness  Left hip pain  Difficulty walking      Subjective Assessment - 04/28/15 1258    Subjective Pt. states she fell in [place] yesterday with no injury.  Pt. able to get off of ground by herself.  Pt. was not able to see Dr. Marry Guan last week for MRI results do to showing up on wrong appt. day.   Limitations Sitting;Lifting;Standing;Walking   How long can you stand comfortably? <5 minutes   How long can you walk comfortably? pt. able to walk 250 feet with SPC safely on a level surfaces   Diagnostic tests MRI completed on R knee 7/22/ MD f/u with Dr. Marry Guan mid-August.    Patient Stated Goals Increase L hip ROM/ strength and decrease pain with daily tasks/ walking.     Currently in Pain? No/denies      OBJECTIVE:There.ex.: Nustep L5 10 min. B LE only (no charge/ no rest breaks)- good warm-up with no hip pain reported. Airex balance/ step ups/ overs/ wt. Shifts in //-bars.  TG knee flexion 30x (assist to get on/off of TG).    Manual tx.: Supine B LE stretches 11 min.  L hip contract-relax to increase hip flexion 3x.  Gait training: Stair training with recip. Pattern and use of 1 handrail to simulate home use. Ambulate in clinic/ hallway with use of SPC. Gait instruction to increase heel strike/ hip flexion and knee flexion (L hip flexion limited as compared to R)- slow but controlled balance.  Focused on continuous stepping to improve antalgic pattern/ pause in gait.     Pt response for medical necessity:Extra time for safety with initial steps after standing/ walking/ transferring.  Balance training required to continue to improve safety/ decrease fall risk.        PT Long Term Goals - 04/17/15 1326    PT LONG TERM GOAL #1   Title Pt. I with HEP to increase L hip strength to grossly 4+/5 MMT to improve pain-free mobility/ walking.     Baseline B LE muscle strength grossly 5/5 MMT except hip flexion 4/5 MMT.     Time 4   Period Weeks   Status Partially Met   PT LONG TERM GOAL #2   Title Pt. will complete LEFS and score >40 out of 80 to improve pain-free mobility.     Baseline 29/80 on 04/03/15   Time 4   Period Weeks  Status Partially Met   PT LONG TERM GOAL #3   Title Pt. will ambulate community distance with more normalized gait pattern and least assistive device safely.    Baseline SPC   Time 4   Period Weeks   Status Partially Met   PT LONG TERM GOAL #4   Title Pt. able to ascend/ descend 10 steps with recip. pattern and use of handrails safely.    Baseline Step to pattern due to R knee pain/joint stiffness   Time 4   Period Weeks   Status Partially Met           Plan - 04/28/15 1327    Clinical Impression Statement Pt had no loss of balance with gait training or balance activities. Pt is progressing with balance and able to maintain EO on foam for 1 minute; pt continues to need verbal cues to decrease posterior lean with balance and maintain weight through her toes.  Pt instructed to use her hip flexors at home more instead of picking up leg with her hands to progress L hip flexor strength.  L hip flexion AROM remains impaired due to muscle weakness/ shortening.    Pt will benefit from skilled therapeutic intervention in order to improve on the following deficits Abnormal gait;Decreased endurance;Hypomobility;Decreased scar mobility;Decreased activity tolerance;Decreased strength;Pain;Difficulty walking;Decreased mobility;Decreased balance;Improper body mechanics;Decreased safety awareness   Rehab Potential Good   PT Frequency 2x / week   PT Duration 4 weeks   PT Treatment/Interventions ADLs/Self Care Home Management;Neuromuscular re-education;Aquatic Therapy;Passive range of motion;Patient/family education;Gait training;Stair training;Cryotherapy;Functional mobility training;Therapeutic activities;Therapeutic exercise;Balance training;Manual techniques   PT Next Visit Plan Increase B LE muscle strength/ balance tasks.    PT Home Exercise Plan continue with current HEP/ daily walking with use of SPC   Consulted and Agree with Plan of Care Patient        Problem List Patient Active Problem List   Diagnosis Date Noted  . LBP (low back pain) 03/30/2015  . Neuropathy 03/30/2015  . Chest pain, non-cardiac 03/30/2015  . Neoplasm of pituitary gland 03/30/2015  . Cancer of thyroid 03/30/2015  . Combined fat and carbohydrate induced hyperlipemia 10/17/2014   Pura Spice, PT, DPT # [id number]   04/28/2015, 4:50 PM  [place] [facility] [address] [place], [name], [address] Phone: [phone]   Fax:  [phone]

## 2015-05-01 ENCOUNTER — Encounter: Payer: Self-pay | Admitting: Physical Therapy

## 2015-05-01 ENCOUNTER — Ambulatory Visit: Payer: Medicare Other | Admitting: Physical Therapy

## 2015-05-01 DIAGNOSIS — R262 Difficulty in walking, not elsewhere classified: Secondary | ICD-10-CM

## 2015-05-01 DIAGNOSIS — M6281 Muscle weakness (generalized): Secondary | ICD-10-CM

## 2015-05-01 DIAGNOSIS — M25552 Pain in left hip: Secondary | ICD-10-CM

## 2015-05-01 NOTE — Therapy (Signed)
[place] [facility] [address] [place], [name], [address] Phone: [phone]   Fax:  [phone]  Physical Therapy Treatment  Patient Details  Name: Laura Walters MRN: [id number] Date of Birth: 06-29-1933 Referring Provider:  Dereck Leep, MD  Encounter Date: 05/01/2015      PT End of Session - 05/01/15 1438    Visit Number 16   Number of Visits 19   Date for PT Re-Evaluation 05/22/15   Authorization - Visit Number 16   Authorization - Number of Visits 19   PT Start Time 1301   PT Stop Time 2863   PT Time Calculation (min) 57 min   Equipment Utilized During Treatment Gait belt   Activity Tolerance Patient tolerated treatment well;Patient limited by pain;Patient limited by fatigue   Behavior During Therapy [facility] for tasks assessed/performed      Past Medical History  Diagnosis Date  . Cancer of thyroid 03/30/2015    History reviewed. No pertinent past surgical history.  There were no vitals filed for this visit.  Visit Diagnosis:  Muscle weakness  Left hip pain  Difficulty walking      Subjective Assessment - 05/01/15 1304    Subjective Pt reports no pain or falls since last PT tx session. Pt stated she took a pain pill prior to tx session as a preventive measure. Pt reports not using her SPC in the house and furniture/counter surfacing.    Limitations Sitting;Lifting;Standing;Walking   How long can you stand comfortably? <5 minutes   How long can you walk comfortably? pt. able to walk 250 feet with SPC safely on a level surfaces   Diagnostic tests MRI completed on R knee 7/22/ MD f/u with Dr. Marry Guan mid-August.    Patient Stated Goals Increase L hip ROM/ strength and decrease pain with daily tasks/ walking.     Currently in Pain? No/denies   Pain Onset In the past 7 days        OBJECTIVE:There.ex.: Nustep L5 10 min. B LE only (no charge/ no rest breaks)- good warm-up with no hip pain reported. Resisted walking 1  BTB all 4 planes 4x with CGA. (no loss of balance, occasional use of UE to balance and stabilize with changing push/pull direction. 20 seated LAQ and seated L hip flexion. (no complaints of increased pain, just tightness in L hip) Gait training:  Ambulate in clinic/ hallway with use of SPC. Gait instruction to increase heel strike/ hip flexion(L hip flexion limited as compared to R)- slow but controlled balance. And then gait with head turns with a focus on continuous stepping to improve antalgic pattern/ pause in gait.Neuro Re-ed: Airex balance/ step ups/ L hip marching in //-bars. Standing on firm surface with no UE support, dynamic reaching for cones at shoulder height on standing table x 4 L/R.   Pt response for medical necessity:Extra time for safety with initial steps for a transfer. Balance training required to continue to improve safety/ decrease fall risk. Gait training needed to encourage consistent stepping pattern and use of SPC at all times.          PT Long Term Goals - 04/17/15 1326    PT LONG TERM GOAL #1   Title Pt. I with HEP to increase L hip strength to grossly 4+/5 MMT to improve pain-free mobility/ walking.     Baseline B LE muscle strength grossly 5/5 MMT except hip flexion 4/5 MMT.     Time 4  Period Weeks   Status Partially Met   PT LONG TERM GOAL #2   Title Pt. will complete LEFS and score >40 out of 80 to improve pain-free mobility.     Baseline 29/80 on 04/03/15   Time 4   Period Weeks   Status Partially Met   PT LONG TERM GOAL #3   Title Pt. will ambulate community distance with more normalized gait pattern and least assistive device safely.    Baseline SPC   Time 4   Period Weeks   Status Partially Met   PT LONG TERM GOAL #4   Title Pt. able to ascend/ descend 10 steps with recip. pattern and use of handrails safely.    Baseline Step to pattern due to R knee pain/joint stiffness   Time 4   Period Weeks   Status Partially Met        Problem List Patient Active Problem List   Diagnosis Date Noted  . LBP (low back pain) 03/30/2015  . Neuropathy 03/30/2015  . Chest pain, non-cardiac 03/30/2015  . Neoplasm of pituitary gland 03/30/2015  . Cancer of thyroid 03/30/2015  . Combined fat and carbohydrate induced hyperlipemia 10/17/2014   Pura Spice, PT, DPT # [id number]   05/01/2015, 2:56 PM  [place] [facility] [address] [place], [name], [address] Phone: [phone]   Fax:  [phone]

## 2015-05-05 ENCOUNTER — Ambulatory Visit: Payer: Medicare Other | Admitting: Physical Therapy

## 2015-05-05 DIAGNOSIS — R262 Difficulty in walking, not elsewhere classified: Secondary | ICD-10-CM

## 2015-05-05 DIAGNOSIS — M6281 Muscle weakness (generalized): Secondary | ICD-10-CM

## 2015-05-05 DIAGNOSIS — M25552 Pain in left hip: Secondary | ICD-10-CM

## 2015-05-05 NOTE — Therapy (Signed)
[place] [facility] [address] [place], [name], [address] Phone: [phone]   Fax:  [phone]  Physical Therapy Treatment  Patient Details  Name: Laura Walters MRN: [id number] Date of Birth: [birth year] Referring Provider:  Dereck Leep, MD  Encounter Date: 05/05/2015      PT End of Session - 05/05/15 1510    Visit Number 17   Number of Visits 19   Date for PT Re-Evaluation 05/22/15   Authorization - Visit Number 17   Authorization - Number of Visits 19   PT Start Time 6962   PT Stop Time 1356   PT Time Calculation (min) 54 min   Equipment Utilized During Treatment Gait belt   Activity Tolerance Patient tolerated treatment well;Patient limited by pain;Patient limited by fatigue   Behavior During Therapy [facility] for tasks assessed/performed      Past Medical History  Diagnosis Date  . Cancer of thyroid 03/30/2015    No past surgical history on file.  There were no vitals filed for this visit.  Visit Diagnosis:  Muscle weakness  Left hip pain  Difficulty walking      Subjective Assessment - 05/05/15 1304    Subjective Pt reports falling into a bush on her way to the storage building. Pt reports no pain but soreness and stiffness from her fall. Pt reports that she thinks she would be safer with a walker outside. Pt reports having a walker at home.    Limitations Sitting;Lifting;Standing;Walking   How long can you stand comfortably? <5 minutes   How long can you walk comfortably? pt. able to walk 250 feet with SPC safely on a level surfaces   Diagnostic tests MRI completed on R knee 7/22/ MD f/u with Dr. Marry Guan mid-August.    Patient Stated Goals Increase L hip ROM/ strength and decrease pain with daily tasks/ walking.     Currently in Pain? No/denies      OBJECTIVE:There.ex.: Nustep L6 10 min. B LE/UE  (no charge/ no rest breaks)- good warm-up with no hip pain reported.TG x 20 knee flexion with moderate assistance  (easily fatigue, L knee soreness, assistance getting off device). Step overs forward and sideways with one UE support (attempted with no UE support unsure of legs, no reported hip pain).  Gait training: Ambulate in clinic/ outside on grass and curbs with SPC. Gait instruction to increase heel strike/ hip flexion(L hip flexion limited as compared to R) and decreased antalgic gait pattern- slow but controlled balance. Manual: supine bilateral LE stretching and lumbar general stretching (no hip precautions broken on L LE).  Pt response for medical necessity:Extra time for safety with initial steps for a transfer. Gait training needed to encourage consistent stepping pattern and use of SPC at all times. Patient educated on use of walker with all outside activities.       PT Long Term Goals - 04/17/15 1326    PT LONG TERM GOAL #1   Title Pt. I with HEP to increase L hip strength to grossly 4+/5 MMT to improve pain-free mobility/ walking.     Baseline B LE muscle strength grossly 5/5 MMT except hip flexion 4/5 MMT.     Time 4   Period Weeks   Status Partially Met   PT LONG TERM GOAL #2   Title Pt. will complete LEFS and score >40 out of 80 to improve pain-free mobility.     Baseline 29/80 on 04/03/15   Time 4  Period Weeks   Status Partially Met   PT LONG TERM GOAL #3   Title Pt. will ambulate community distance with more normalized gait pattern and least assistive device safely.    Baseline SPC   Time 4   Period Weeks   Status Partially Met   PT LONG TERM GOAL #4   Title Pt. able to ascend/ descend 10 steps with recip. pattern and use of handrails safely.    Baseline Step to pattern due to R knee pain/joint stiffness   Time 4   Period Weeks   Status Partially Met       Problem List Patient Active Problem List   Diagnosis Date Noted  . LBP (low back pain) 03/30/2015  . Neuropathy 03/30/2015  . Chest pain, non-cardiac 03/30/2015  . Neoplasm of pituitary  gland 03/30/2015  . Cancer of thyroid 03/30/2015  . Combined fat and carbohydrate induced hyperlipemia 10/17/2014   Pura Spice, PT, DPT # [id number]   05/05/2015, 5:26 PM  [place] [facility] [address] [place], [name], [address] Phone: [phone]   Fax:  [phone]

## 2015-05-08 ENCOUNTER — Ambulatory Visit: Payer: Medicare Other | Admitting: Physical Therapy

## 2015-05-08 DIAGNOSIS — M6281 Muscle weakness (generalized): Secondary | ICD-10-CM

## 2015-05-08 DIAGNOSIS — M25552 Pain in left hip: Secondary | ICD-10-CM

## 2015-05-08 DIAGNOSIS — R262 Difficulty in walking, not elsewhere classified: Secondary | ICD-10-CM

## 2015-05-08 NOTE — Therapy (Signed)
[name], [name], [address] Phone: [phone]   Fax:  [phone]  Physical Therapy Treatment  Patient Details  Name: Laura Walters MRN: [id number] Date of Birth: 1932/11/03 Referring Provider:  Dereck Leep, MD  Encounter Date: 05/08/2015      PT End of Session - 05/08/15 1302    Visit Number 18   Number of Visits 19   Date for PT Re-Evaluation 05/22/15   Authorization - Visit Number 18   Authorization - Number of Visits 19   PT Start Time 9470   PT Stop Time 9628   PT Time Calculation (min) 52 min   Equipment Utilized During Treatment Gait belt   Activity Tolerance Patient tolerated treatment well;Patient limited by pain;Patient limited by fatigue   Behavior During Therapy [facility] for tasks assessed/performed      Past Medical History  Diagnosis Date  . Cancer of thyroid 03/30/2015    No past surgical history on file.  There were no vitals filed for this visit.  Visit Diagnosis:  Muscle weakness  Left hip pain  Difficulty walking      Subjective Assessment - 05/08/15 1259    Subjective Pt reports no pain today and no falls since last PT tx session. Pt reports returning to MD today and going to have R knee menicusal repair soon.    Limitations Sitting;Lifting;Standing;Walking   How long can you stand comfortably? <5 minutes   How long can you walk comfortably? pt. able to walk 250 feet with SPC safely on a level surfaces   Diagnostic tests MRI completed on R knee 7/22/ MD f/u with Dr. Marry Guan mid-August.    Patient Stated Goals Increase L hip ROM/ strength and decrease pain with daily tasks/ walking.     Currently in Pain? No/denies   Pain Score 0-No pain      OBJECTIVE:There.ex.: Nustep L6 10 min. B LE/UE (no charge/ no rest breaks)- good warm-up with no hip pain reported.TG x 20 knee flexion with moderate assistance (easily fatigue, L knee soreness, assistance getting off device).  Standing hip ex. Program with min. UE assist (posture correction). Gait training: Ambulate in clinic/ outside with SPC. Gait instruction to increase heel strike/ hip flexion(L hip flexion limited as compared to R) and decreased antalgic gait pattern- slow but controlled balance.  Manual: supine bilateral LE stretching and lumbar general stretching (assessed mi-back discomfort)- 5 min.  Neuro mm: standing perturbations in //-bars (difficulty/ moderate verbal cuing).  Tandem walking with 1 UE assist.  GTB hip abd. With B UE assist. Backwards walking in //-bars (heel to toe).   Pt response for medical necessity:Gait training needed to encourage consistent stepping pattern and use of SPC at all times. Patient educated on use of walker with all outside activities.  Moderate cuing with higher level balance tasks/ perturbations.          PT Long Term Goals - 04/17/15 1326    PT LONG TERM GOAL #1   Title Pt. I with HEP to increase L hip strength to grossly 4+/5 MMT to improve pain-free mobility/ walking.     Baseline B LE muscle strength grossly 5/5 MMT except hip flexion 4/5 MMT.     Time 4   Period Weeks   Status Partially Met   PT LONG TERM GOAL #2   Title Pt. will complete LEFS and score >40 out of 80 to improve pain-free mobility.     Baseline 29/80  on 04/03/15   Time 4   Period Weeks   Status Partially Met   PT LONG TERM GOAL #3   Title Pt. will ambulate community distance with more normalized gait pattern and least assistive device safely.    Baseline SPC   Time 4   Period Weeks   Status Partially Met   PT LONG TERM GOAL #4   Title Pt. able to ascend/ descend 10 steps with recip. pattern and use of handrails safely.    Baseline Step to pattern due to R knee pain/joint stiffness   Time 4   Period Weeks   Status Partially Met               Plan - 05/08/15 1409    Clinical Impression Statement Pt continues to progress with SPC gait with minimal verbal cues for  increased hip flexion and heel strike with initial contact. Pt continues to progress with balance but requires UE support and SBA/CGA with all activities. Poor ability to maintain balance with standing pertubations in //-bars.     Pt will benefit from skilled therapeutic intervention in order to improve on the following deficits Abnormal gait;Decreased endurance;Hypomobility;Decreased scar mobility;Decreased activity tolerance;Decreased strength;Pain;Difficulty walking;Decreased mobility;Decreased balance;Improper body mechanics;Decreased safety awareness   Rehab Potential Good   PT Frequency 2x / week   PT Duration 4 weeks   PT Treatment/Interventions ADLs/Self Care Home Management;Neuromuscular re-education;Aquatic Therapy;Passive range of motion;Patient/family education;Gait training;Stair training;Cryotherapy;Functional mobility training;Therapeutic activities;Therapeutic exercise;Balance training;Manual techniques   PT Next Visit Plan Gait training on unlevel surface and gait head turns. Fall recovery.  Update goals/ check cert dates.    PT Home Exercise Plan reviewed HEP with pt and standed she would try to complete everyday over the next 3 days         Problem List Patient Active Problem List   Diagnosis Date Noted  . LBP (low back pain) 03/30/2015  . Neuropathy 03/30/2015  . Chest pain, non-cardiac 03/30/2015  . Neoplasm of pituitary gland 03/30/2015  . Cancer of thyroid 03/30/2015  . Combined fat and carbohydrate induced hyperlipemia 10/17/2014   Pura Spice, PT, DPT # [id number]   05/09/2015, 11:11 AM  [place] [facility] [address] [place], [name], [address] Phone: [phone]   Fax:  [phone]

## 2015-05-12 ENCOUNTER — Encounter: Payer: Self-pay | Admitting: Physical Therapy

## 2015-05-12 ENCOUNTER — Ambulatory Visit: Payer: Medicare Other | Admitting: Physical Therapy

## 2015-05-12 DIAGNOSIS — R262 Difficulty in walking, not elsewhere classified: Secondary | ICD-10-CM

## 2015-05-12 DIAGNOSIS — M6281 Muscle weakness (generalized): Secondary | ICD-10-CM

## 2015-05-12 DIAGNOSIS — M25552 Pain in left hip: Secondary | ICD-10-CM

## 2015-05-12 NOTE — Therapy (Signed)
[place] [facility] [address] [place], [name], [address] Phone: [phone]   Fax:  [phone]  Physical Therapy Treatment  Patient Details  Name: Laura Walters MRN: [id number] Date of Birth: 21-Aug-1933 Referring Provider:  Dereck Leep, MD  Encounter Date: 05/12/2015      PT End of Session - 05/12/15 1401    Visit Number 19   Number of Visits 19   Date for PT Re-Evaluation 05/22/15   Authorization - Visit Number 19   Authorization - Number of Visits 19   PT Start Time 3419   PT Stop Time 6222   PT Time Calculation (min) 62 min   Equipment Utilized During Treatment Gait belt   Activity Tolerance Patient tolerated treatment well;Patient limited by pain;Patient limited by fatigue   Behavior During Therapy [facility] for tasks assessed/performed      Past Medical History  Diagnosis Date  . Cancer of thyroid 03/30/2015    History reviewed. No pertinent past surgical history.  There were no vitals filed for this visit.  Visit Diagnosis:  Muscle weakness  Left hip pain  Difficulty walking      Subjective Assessment - 05/12/15 1400    Subjective Pt reports getting a bicycle for home and riding it this morning for 15 mins. Pt is scheduled for R menicus surgery next Monday (8/22).    Limitations Sitting;Lifting;Standing;Walking   How long can you stand comfortably? <5 minutes   How long can you walk comfortably? pt. able to walk 250 feet with SPC safely on a level surfaces   Diagnostic tests MRI completed on R knee 7/22/ MD f/u with Dr. Marry Guan mid-August.    Patient Stated Goals Increase L hip ROM/ strength and decrease pain with daily tasks/ walking.     Currently in Pain? No/denies       OBJECTIVE: There ex:  warm up: 10 minutes on Nustep with B UE/LE L7; no rest breaks (no charge). TG 30 knee flexion. 6" step ups forward/sideways x 20 each with one UE support. 2" step ups with no UE support x 10. Neuro re-ed: balance on airex  pad normal/adducted (narrow base of support)/semi-tandem/ normal with eyes closed/narrow with eyes closed 2 x 30 seconds each. Marching on airex pad 2 x 20 with one UE support (able to complete last 6 with no UE support). Gait: in the // bars, step over step gait pattern with 6" step over. Hallway walking with SPC x 4 passes. Increased cuing for hip flexion and heel strike. Moderate cuing to not scuff her toes needed with gait training.     Pt response for medical necessity: Pt continues to increase her balance and demonstrate appropriate responses to postural sway. Pt continues to build endurance by requiring fewer rest breaks and more time standing overall.         PT Long Term Goals - 04/17/15 1326    PT LONG TERM GOAL #1   Title Pt. I with HEP to increase L hip strength to grossly 4+/5 MMT to improve pain-free mobility/ walking.     Baseline B LE muscle strength grossly 5/5 MMT except hip flexion 4/5 MMT.     Time 4   Period Weeks   Status Partially Met   PT LONG TERM GOAL #2   Title Pt. will complete LEFS and score >40 out of 80 to improve pain-free mobility.     Baseline 29/80 on 04/03/15   Time 4   Period Weeks  Status Partially Met   PT LONG TERM GOAL #3   Title Pt. will ambulate community distance with more normalized gait pattern and least assistive device safely.    Baseline SPC   Time 4   Period Weeks   Status Partially Met   PT LONG TERM GOAL #4   Title Pt. able to ascend/ descend 10 steps with recip. pattern and use of handrails safely.    Baseline Step to pattern due to R knee pain/joint stiffness   Time 4   Period Weeks   Status Partially Met          Plan - 05/12/15 1402    Clinical Impression Statement Pt reports feeling like her balance is off but is able to handle balance challenges in the parallel bars with UE support and CGA for all activities. Pt is able to maintain eyes closed on foam for 30 seconds with minor postural swaying. Pt ambulates with a fluid 2  point step through gait speed with decreased cadence with a SPC. Pt continues to need cuing for heel strike and hip flexion.    Pt will benefit from skilled therapeutic intervention in order to improve on the following deficits Abnormal gait;Decreased endurance;Hypomobility;Decreased scar mobility;Decreased activity tolerance;Decreased strength;Pain;Difficulty walking;Decreased mobility;Decreased balance;Improper body mechanics;Decreased safety awareness   Rehab Potential Good   PT Frequency 2x / week   PT Duration 4 weeks   PT Treatment/Interventions ADLs/Self Care Home Management;Neuromuscular re-education;Aquatic Therapy;Passive range of motion;Patient/family education;Gait training;Stair training;Cryotherapy;Functional mobility training;Therapeutic activities;Therapeutic exercise;Balance training;Manual techniques   PT Next Visit Plan Review goals. Review exercises. Pt will likely be discharged from PT until new order post surgery is received.    Consulted and Agree with Plan of Care Patient        Problem List Patient Active Problem List   Diagnosis Date Noted  . LBP (low back pain) 03/30/2015  . Neuropathy 03/30/2015  . Chest pain, non-cardiac 03/30/2015  . Neoplasm of pituitary gland 03/30/2015  . Cancer of thyroid 03/30/2015  . Combined fat and carbohydrate induced hyperlipemia 10/17/2014   Pura Spice, PT, DPT # [phone]   05/12/2015, 2:07 PM  [place] [facility] [address] [place], [name], [address] Phone: [phone]   Fax:  [phone]

## 2015-05-13 ENCOUNTER — Encounter
Admission: RE | Admit: 2015-05-13 | Discharge: 2015-05-13 | Disposition: A | Discharge status: Home / Self Care | Payer: Medicare Other | Source: Ambulatory Visit | Attending: Orthopedic Surgery | Admitting: Orthopedic Surgery

## 2015-05-13 DIAGNOSIS — Z01810 Encounter for preprocedural cardiovascular examination: Secondary | ICD-10-CM | POA: Insufficient documentation

## 2015-05-13 DIAGNOSIS — Z01812 Encounter for preprocedural laboratory examination: Secondary | ICD-10-CM | POA: Diagnosis present

## 2015-05-13 HISTORY — DX: Essential (primary) hypertension: I10

## 2015-05-13 HISTORY — DX: Other intervertebral disc degeneration, lumbar region: M51.36

## 2015-05-13 HISTORY — DX: Unspecified osteoarthritis, unspecified site: M19.90

## 2015-05-13 HISTORY — DX: Hypothyroidism, unspecified: E03.9

## 2015-05-13 HISTORY — DX: Other intervertebral disc degeneration, lumbar region without mention of lumbar back pain or lower extremity pain: M51.369

## 2015-05-13 HISTORY — DX: Polyneuropathy, unspecified: G62.9

## 2015-05-13 HISTORY — DX: Unspecified asthma, uncomplicated: J45.909

## 2015-05-13 HISTORY — DX: Cardiomyopathy, unspecified: I42.9

## 2015-05-13 HISTORY — DX: Chest pain, unspecified: R07.9

## 2015-05-13 LAB — POTASSIUM: POTASSIUM: 3.6 mmol/L (ref 3.5–5.1)

## 2015-05-13 NOTE — Patient Instructions (Signed)
  Your procedure is scheduled on: 05/19/15 Report to Day Surgery. [facility]OND FLOOR To find out your arrival time please call [phone] between 1PM - 3PM on 05/16/15  Remember: Instructions that are not followed completely may result in serious medical risk, up to and including death, or upon the discretion of your surgeon and anesthesiologist your surgery may need to be rescheduled.    __X__ 1. Do not eat food or drink liquids after midnight. No gum chewing or hard candies.     ___X 2. No Alcohol for 24 hours before or after surgery.   ____ 3. Bring all medications with you on the day of surgery if instructed.    ___X_ 4. Notify your doctor if there is any change in your medical condition     (cold, fever, infections).     Do not wear jewelry, make-up, hairpins, clips or nail polish.  Do not wear lotions, powders, or perfumes. You may wear deodorant.  Do not shave 48 hours prior to surgery. Men may shave face and neck.  Do not bring valuables to the hospital.    [facility] is not responsible for any belongings or valuables.               Contacts, dentures or bridgework may not be worn into surgery.  Leave your suitcase in the car. After surgery it may be brought to your room.  For patients admitted to the hospital, discharge time is determined by your                treatment team.   Patients discharged the day of surgery will not be allowed to drive home.   Please read over the following fact sheets that you were given:   Surgical Site Infection Prevention   ____ Take these medicines the morning of surgery with A SIP OF WATER:    1. LEVOTHROID  2. CARVEDILOL  3. OMEPRAZOLE  4.LYRICA  5.DULOXETINE  6.  ____ Fleet Enema (as directed)   _X___ Use CHG Soap as directed  ____ Use inhalers on the day of surgery  ____ Stop metformin 2 days prior to surgery    ____ Take 1/2 of usual insulin dose the night before surgery and none on the morning of surgery.   ____  Stop Coumadin/Plavix/aspirin on   ____ Stop Anti-inflammatories on   ____ Stop supplements until after surgery.    ____ Bring C-Pap to the hospital.

## 2015-05-15 ENCOUNTER — Ambulatory Visit: Payer: Medicare Other | Admitting: Physical Therapy

## 2015-05-15 ENCOUNTER — Encounter: Payer: Self-pay | Admitting: Physical Therapy

## 2015-05-15 DIAGNOSIS — M6281 Muscle weakness (generalized): Secondary | ICD-10-CM | POA: Diagnosis not present

## 2015-05-15 DIAGNOSIS — M25552 Pain in left hip: Secondary | ICD-10-CM

## 2015-05-15 DIAGNOSIS — R262 Difficulty in walking, not elsewhere classified: Secondary | ICD-10-CM

## 2015-05-15 NOTE — Therapy (Signed)
[facility] [address]. [place], [name], [address] Phone: [phone]   Fax:  [phone]  Physical Therapy Treatment  Patient Details  Name: Laura Walters MRN: [id number] Date of Birth: 09/22/1933 Referring Provider:  Dereck Leep, MD  Encounter Date: 05/15/2015      PT End of Session - 05/15/15 1319    Visit Number 20   Number of Visits 20   Date for PT Re-Evaluation 05/22/15   Authorization - Visit Number 20   Authorization - Number of Visits 20   PT Start Time 8882   PT Stop Time 8003   PT Time Calculation (min) 47 min   Equipment Utilized During Treatment Gait belt   Activity Tolerance Patient tolerated treatment well;Patient limited by fatigue   Behavior During Therapy [facility] for tasks assessed/performed      Past Medical History  Diagnosis Date  . Arthritis   . Cardiomyopathy     idiopathic  . Hypertension   . Neuropathy   . Hypothyroidism   . Cancer of thyroid 03/30/2015  . DDD (degenerative disc disease), lumbar   . RAD (reactive airway disease)   . Chest pain     non cardiac    Past Surgical History  Procedure Laterality Date  . Pituitary surgery    . Thyroidectomy    . Hemorroidectomy      x 2  . Parotidectomy    . Dilation and curettage of uterus    . Joint replacement Left     thr    There were no vitals filed for this visit.  Visit Diagnosis:  Muscle weakness  Left hip pain  Difficulty walking      Subjective Assessment - 05/15/15 1404    Subjective Pt reports no R knee pain but L leg pain at a 3/10. Pt reports riding her bicycle at home this morning.    Limitations Sitting;Lifting;Standing;Walking   How long can you stand comfortably? <5 minutes   How long can you walk comfortably? pt. able to walk 330 feet with SPC safely on a level surfaces   Diagnostic tests MRI completed on R knee 7/22/ MD f/u with Dr. Marry Guan mid-August.    Patient Stated Goals Increase L hip ROM/ strength and  decrease pain with daily tasks/ walking.     Currently in Pain? Yes   Pain Score 3    Pain Location Leg   Pain Orientation Left   Pain Descriptors / Indicators Aching   Pain Type Chronic pain   Pain Onset 1 to 4 weeks ago      OBJECTIVE: Nu step L7 10 minutes (warm-up, no rest breaks) no charge.  LEFS: 39/80 Neuro re-ed: EO and EC on foam surface x 1 minute hold each in // bars. 2 repetitions for each. (minor postural sway noted but patient able to correct without assistance). In // bars, pt stood on firm/semi-tandem/foam with dynamic UE catching and throwing a ball with close supervision (no UE support, no loss of balance noted). There ex: Obstacle course: stepping around/tapping cones, over canes, up to steps and onto airex pad x 4. Pt tolerated well and able to perform in 1:01 and 0:48 seconds. Pt required CGA to complete the obstacle course but had no loss of balance. Pt had mild difficulty with L hip flexion to tap cones; Tandem walking Forwards/Backwards in // bars. Pt balance challenged but tolerated well without loss of balance. Pt required B UE support to maintain balance.  PT Education - 06/12/2015 1320    Education provided Yes   Education Details Pt educated on importance of using her walker post-surgery to provide more stability. Pt educated on proper gait pattern and sequencing throughout gait cycle especially with heel strike on initial contact.    Person(s) Educated Patient   Methods Explanation;Demonstration   Comprehension Verbalized understanding;Returned demonstration;Need further instruction             PT Long Term Goals - 2015-06-12 1312    PT LONG TERM GOAL #1   Title Pt. I with HEP to increase L hip strength to grossly 4+/5 MMT to improve pain-free mobility/ walking.     Baseline B LE muscle strength grossly 5/5 MMT except hip flexion 4/5 MMT.     Time 4   Period Weeks   Status Partially Met   PT LONG TERM GOAL  #2   Title Pt. will complete LEFS and score >40 out of 80 to improve pain-free mobility.     Baseline 29/80 on 04/03/15, on 06/12/15 39/80   Time 4   Period Weeks   Status Partially Met   PT LONG TERM GOAL #3   Title Pt. will ambulate community distance with more normalized gait pattern and least assistive device safely.    Baseline SPC (hurri-cane)   Time 4   Period Weeks   Status Achieved   PT LONG TERM GOAL #4   Title Pt. able to ascend/ descend 10 steps with recip. pattern and use of handrails safely.    Baseline Step through pattern with use of her UEs.    Time 4   Period Weeks   Status Achieved               Plan - June 12, 2015 1317    Clinical Impression Statement Pt continues to show improvements with balance, gait and strengthening with PT. Pt was able to maintain eyes closed on foam balance for 1 min without UE support and no loss of balance noted. Pt was able to perform dynamic balance challenges with various stances and on foam without UE support and no loss of balance noted. Pt ambulates with a 2 point gait pattern using a SPC with a step through gait pattern with decreased step lenght and L hip/knee flexion. Pt is now able to lift her L hip flexor against gravity but able tolerate minimal pressure.  At this time, pt is discharged from PT due to upcoming R knee surgery.    Pt will benefit from skilled therapeutic intervention in order to improve on the following deficits Abnormal gait;Decreased endurance;Hypomobility;Decreased scar mobility;Decreased activity tolerance;Decreased strength;Pain;Difficulty walking;Decreased mobility;Decreased balance;Improper body mechanics;Decreased safety awareness   Rehab Potential Good   PT Frequency 2x / week   PT Duration 4 weeks   PT Treatment/Interventions ADLs/Self Care Home Management;Neuromuscular re-education;Aquatic Therapy;Passive range of motion;Patient/family education;Gait training;Stair training;Cryotherapy;Functional mobility  training;Therapeutic activities;Therapeutic exercise;Balance training;Manual techniques   PT Next Visit Plan Discharging this visit.    PT Home Exercise Plan Reviewed her PT needs after today with pending surgery.    Consulted and Agree with Plan of Care Patient          G-Codes - 12-Jun-2015 1413    Functional Assessment Tool Used LEFS/ gait pattern/ pain/ clinical judgement   Functional Limitation Mobility: Walking and moving around   Mobility: Walking and Moving Around Current Status ([id number]) At least 20 percent but less than 40 percent impaired, limited or restricted   Mobility: Walking and Moving Around Discharge  Status [phone]) At least 20 percent but less than 40 percent impaired, limited or restricted      Problem List Patient Active Problem List   Diagnosis Date Noted  . LBP (low back pain) 03/30/2015  . Neuropathy 03/30/2015  . Chest pain, non-cardiac 03/30/2015  . Neoplasm of pituitary gland 03/30/2015  . Cancer of thyroid 03/30/2015  . Combined fat and carbohydrate induced hyperlipemia 10/17/2014   Kerman Passey, PT, DPT    05/15/2015, 2:16 PM  [facility] [address] [place], [name], [address] Phone: [phone]   Fax:  [phone]

## 2015-05-19 ENCOUNTER — Ambulatory Visit
Admission: RE | Admit: 2015-05-19 | Discharge: 2015-05-19 | Disposition: A | Discharge status: Home / Self Care | Payer: Medicare Other | Source: Ambulatory Visit | Attending: Orthopedic Surgery | Admitting: Orthopedic Surgery

## 2015-05-19 ENCOUNTER — Encounter: Payer: Self-pay | Admitting: *Deleted

## 2015-05-19 ENCOUNTER — Ambulatory Visit: Payer: Medicare Other | Admitting: Certified Registered"

## 2015-05-19 ENCOUNTER — Encounter
Admission: RE | Disposition: A | Discharge status: Home / Self Care | Payer: Self-pay | Source: Ambulatory Visit | Attending: Orthopedic Surgery

## 2015-05-19 DIAGNOSIS — I10 Essential (primary) hypertension: Secondary | ICD-10-CM | POA: Insufficient documentation

## 2015-05-19 DIAGNOSIS — Z885 Allergy status to narcotic agent status: Secondary | ICD-10-CM | POA: Diagnosis not present

## 2015-05-19 DIAGNOSIS — K449 Diaphragmatic hernia without obstruction or gangrene: Secondary | ICD-10-CM | POA: Diagnosis not present

## 2015-05-19 DIAGNOSIS — Z85850 Personal history of malignant neoplasm of thyroid: Secondary | ICD-10-CM | POA: Insufficient documentation

## 2015-05-19 DIAGNOSIS — Z8261 Family history of arthritis: Secondary | ICD-10-CM | POA: Diagnosis not present

## 2015-05-19 DIAGNOSIS — Z8262 Family history of osteoporosis: Secondary | ICD-10-CM | POA: Insufficient documentation

## 2015-05-19 DIAGNOSIS — K5790 Diverticulosis of intestine, part unspecified, without perforation or abscess without bleeding: Secondary | ICD-10-CM | POA: Insufficient documentation

## 2015-05-19 DIAGNOSIS — X58XXXA Exposure to other specified factors, initial encounter: Secondary | ICD-10-CM | POA: Diagnosis not present

## 2015-05-19 DIAGNOSIS — Z880 Allergy status to penicillin: Secondary | ICD-10-CM | POA: Diagnosis not present

## 2015-05-19 DIAGNOSIS — M94261 Chondromalacia, right knee: Secondary | ICD-10-CM | POA: Diagnosis not present

## 2015-05-19 DIAGNOSIS — M25561 Pain in right knee: Secondary | ICD-10-CM | POA: Diagnosis present

## 2015-05-19 DIAGNOSIS — Z800 Family history of malignant neoplasm of digestive organs: Secondary | ICD-10-CM | POA: Insufficient documentation

## 2015-05-19 DIAGNOSIS — M545 Low back pain: Secondary | ICD-10-CM | POA: Diagnosis not present

## 2015-05-19 DIAGNOSIS — J329 Chronic sinusitis, unspecified: Secondary | ICD-10-CM | POA: Insufficient documentation

## 2015-05-19 DIAGNOSIS — S83281A Other tear of lateral meniscus, current injury, right knee, initial encounter: Secondary | ICD-10-CM | POA: Diagnosis not present

## 2015-05-19 DIAGNOSIS — Z8349 Family history of other endocrine, nutritional and metabolic diseases: Secondary | ICD-10-CM | POA: Diagnosis not present

## 2015-05-19 DIAGNOSIS — Z8379 Family history of other diseases of the digestive system: Secondary | ICD-10-CM | POA: Insufficient documentation

## 2015-05-19 DIAGNOSIS — E039 Hypothyroidism, unspecified: Secondary | ICD-10-CM | POA: Diagnosis not present

## 2015-05-19 DIAGNOSIS — Z79899 Other long term (current) drug therapy: Secondary | ICD-10-CM | POA: Insufficient documentation

## 2015-05-19 DIAGNOSIS — J45909 Unspecified asthma, uncomplicated: Secondary | ICD-10-CM | POA: Insufficient documentation

## 2015-05-19 DIAGNOSIS — I429 Cardiomyopathy, unspecified: Secondary | ICD-10-CM | POA: Diagnosis not present

## 2015-05-19 DIAGNOSIS — Z96642 Presence of left artificial hip joint: Secondary | ICD-10-CM | POA: Diagnosis not present

## 2015-05-19 DIAGNOSIS — N761 Subacute and chronic vaginitis: Secondary | ICD-10-CM | POA: Diagnosis not present

## 2015-05-19 DIAGNOSIS — M5410 Radiculopathy, site unspecified: Secondary | ICD-10-CM | POA: Diagnosis not present

## 2015-05-19 DIAGNOSIS — Z882 Allergy status to sulfonamides status: Secondary | ICD-10-CM | POA: Insufficient documentation

## 2015-05-19 DIAGNOSIS — E785 Hyperlipidemia, unspecified: Secondary | ICD-10-CM | POA: Diagnosis not present

## 2015-05-19 DIAGNOSIS — S83241A Other tear of medial meniscus, current injury, right knee, initial encounter: Secondary | ICD-10-CM | POA: Insufficient documentation

## 2015-05-19 DIAGNOSIS — Z8249 Family history of ischemic heart disease and other diseases of the circulatory system: Secondary | ICD-10-CM | POA: Diagnosis not present

## 2015-05-19 DIAGNOSIS — Z86010 Personal history of colonic polyps: Secondary | ICD-10-CM | POA: Insufficient documentation

## 2015-05-19 DIAGNOSIS — G609 Hereditary and idiopathic neuropathy, unspecified: Secondary | ICD-10-CM | POA: Insufficient documentation

## 2015-05-19 HISTORY — PX: KNEE ARTHROSCOPY: SHX127

## 2015-05-19 SURGERY — ARTHROSCOPY, KNEE
Anesthesia: General | Laterality: Right

## 2015-05-19 MED ORDER — BUPIVACAINE-EPINEPHRINE (PF) 0.25% -1:200000 IJ SOLN
INTRAMUSCULAR | Status: DC | PRN
  Administered 2015-05-19: 30 mL

## 2015-05-19 MED ORDER — ACETAMINOPHEN 10 MG/ML IV SOLN
INTRAVENOUS | Status: AC
  Filled 2015-05-19: qty 100

## 2015-05-19 MED ORDER — ONDANSETRON HCL 4 MG PO TABS: 4.0000 mg | ORAL_TABLET | Freq: Four times a day (QID) | ORAL | Status: DC | PRN

## 2015-05-19 MED ORDER — GLYCOPYRROLATE 0.2 MG/ML IJ SOLN
INTRAMUSCULAR | Status: DC | PRN
  Administered 2015-05-19: 0.2 mg via INTRAVENOUS

## 2015-05-19 MED ORDER — METOCLOPRAMIDE HCL 5 MG/ML IJ SOLN: 5.0000 mg | Freq: Three times a day (TID) | INTRAMUSCULAR | Status: DC | PRN

## 2015-05-19 MED ORDER — ACETAMINOPHEN 10 MG/ML IV SOLN
INTRAVENOUS | Status: DC | PRN
  Administered 2015-05-19: 1000 mg via INTRAVENOUS

## 2015-05-19 MED ORDER — CLINDAMYCIN PHOSPHATE 900 MG/50ML IV SOLN
INTRAVENOUS | Status: AC
  Filled 2015-05-19: qty 50

## 2015-05-19 MED ORDER — SODIUM CHLORIDE 0.9 % IV SOLN: INTRAVENOUS | Status: DC

## 2015-05-19 MED ORDER — LACTATED RINGERS IV SOLN
INTRAVENOUS | Status: DC
  Administered 2015-05-19: 13:00:00 via INTRAVENOUS

## 2015-05-19 MED ORDER — EPHEDRINE SULFATE 50 MG/ML IJ SOLN
INTRAMUSCULAR | Status: DC | PRN
  Administered 2015-05-19: 10 mg via INTRAVENOUS

## 2015-05-19 MED ORDER — CHLORHEXIDINE GLUCONATE 4 % EX LIQD: 60.0000 mL | Freq: Once | CUTANEOUS | Status: DC

## 2015-05-19 MED ORDER — HYDROCODONE-ACETAMINOPHEN 5-325 MG PO TABS: 1.0000 | ORAL_TABLET | ORAL | Status: DC | PRN

## 2015-05-19 MED ORDER — FENTANYL CITRATE (PF) 100 MCG/2ML IJ SOLN: 25.0000 ug | INTRAMUSCULAR | Status: DC | PRN

## 2015-05-19 MED ORDER — ONDANSETRON HCL 4 MG/2ML IJ SOLN: 4.0000 mg | Freq: Once | INTRAMUSCULAR | Status: DC | PRN

## 2015-05-19 MED ORDER — FENTANYL CITRATE (PF) 100 MCG/2ML IJ SOLN
INTRAMUSCULAR | Status: DC | PRN
  Administered 2015-05-19: 50 ug via INTRAVENOUS
  Administered 2015-05-19 (×2): 25 ug via INTRAVENOUS
  Administered 2015-05-19: 50 ug via INTRAVENOUS

## 2015-05-19 MED ORDER — DEXAMETHASONE SODIUM PHOSPHATE 4 MG/ML IJ SOLN
INTRAMUSCULAR | Status: DC | PRN
  Administered 2015-05-19: 5 mg via INTRAVENOUS

## 2015-05-19 MED ORDER — BUPIVACAINE-EPINEPHRINE (PF) 0.25% -1:200000 IJ SOLN
INTRAMUSCULAR | Status: AC
  Filled 2015-05-19: qty 30

## 2015-05-19 MED ORDER — ONDANSETRON HCL 4 MG/2ML IJ SOLN
INTRAMUSCULAR | Status: DC | PRN
  Administered 2015-05-19: 4 mg via INTRAVENOUS

## 2015-05-19 MED ORDER — LIDOCAINE HCL (CARDIAC) 20 MG/ML IV SOLN
INTRAVENOUS | Status: DC | PRN
  Administered 2015-05-19: 50 mg via INTRAVENOUS

## 2015-05-19 MED ORDER — METOCLOPRAMIDE HCL 10 MG PO TABS: 5.0000 mg | ORAL_TABLET | Freq: Three times a day (TID) | ORAL | Status: DC | PRN

## 2015-05-19 MED ORDER — MORPHINE SULFATE (PF) 4 MG/ML IV SOLN
INTRAVENOUS | Status: AC
  Filled 2015-05-19: qty 1

## 2015-05-19 MED ORDER — PHENYLEPHRINE HCL 10 MG/ML IJ SOLN
INTRAMUSCULAR | Status: DC | PRN
  Administered 2015-05-19: 100 ug via INTRAVENOUS

## 2015-05-19 MED ORDER — CLINDAMYCIN PHOSPHATE 900 MG/50ML IV SOLN
900.0000 mg | INTRAVENOUS | Status: AC
  Administered 2015-05-19: 900 mg via INTRAVENOUS

## 2015-05-19 MED ORDER — PROPOFOL 10 MG/ML IV BOLUS
INTRAVENOUS | Status: DC | PRN
  Administered 2015-05-19: 120 mg via INTRAVENOUS

## 2015-05-19 MED ORDER — ONDANSETRON HCL 4 MG/2ML IJ SOLN: 4.0000 mg | Freq: Four times a day (QID) | INTRAMUSCULAR | Status: DC | PRN

## 2015-05-19 SURGICAL SUPPLY — 22 items
BLADE SHAVER 4.5 DBL SERAT CV (CUTTER) ×3 IMPLANT
BNDG ESMARK 6X12 TAN STRL LF (GAUZE/BANDAGES/DRESSINGS) ×3 IMPLANT
DRSG DERMACEA 8X12 NADH (GAUZE/BANDAGES/DRESSINGS) ×3 IMPLANT
DURAPREP 26ML APPLICATOR (WOUND CARE) ×6 IMPLANT
GAUZE SPONGE 4X4 12PLY STRL (GAUZE/BANDAGES/DRESSINGS) ×3 IMPLANT
GLOVE BIOGEL M STRL SZ7.5 (GLOVE) ×3 IMPLANT
GLOVE INDICATOR 8.0 STRL GRN (GLOVE) ×3 IMPLANT
GOWN STRL REUS W/ TWL LRG LVL3 (GOWN DISPOSABLE) ×1 IMPLANT
GOWN STRL REUS W/TWL LRG LVL3 (GOWN DISPOSABLE) ×2
GOWN STRL REUS W/TWL XL LVL4 (GOWN DISPOSABLE) ×3 IMPLANT
IV LACTATED RINGER IRRG 3000ML (IV SOLUTION) ×12
IV LR IRRIG 3000ML ARTHROMATIC (IV SOLUTION) ×6 IMPLANT
MANIFOLD NEPTUNE II (INSTRUMENTS) ×3 IMPLANT
PACK ARTHROSCOPY KNEE (MISCELLANEOUS) ×3 IMPLANT
SET TUBE SUCT SHAVER OUTFL 24K (TUBING) ×3 IMPLANT
SET TUBE TIP INTRA-ARTICULAR (MISCELLANEOUS) ×3 IMPLANT
STRAP SAFETY BODY (MISCELLANEOUS) ×3 IMPLANT
SUT ETHILON 3-0 FS-10 30 BLK (SUTURE) ×3
SUTURE EHLN 3-0 FS-10 30 BLK (SUTURE) ×1 IMPLANT
TUBING ARTHRO INFLOW-ONLY STRL (TUBING) ×3 IMPLANT
WAND HAND CNTRL MULTIVAC 50 (MISCELLANEOUS) ×3 IMPLANT
WRAP KNEE W/COLD PACKS 25.5X14 (SOFTGOODS) ×3 IMPLANT

## 2015-05-19 NOTE — Anesthesia Preprocedure Evaluation (Addendum)
Anesthesia Evaluation  Patient identified by MRN, date of birth, ID band Patient awake    Reviewed: Allergy & Precautions, NPO status , Patient's Chart, lab work & pertinent test results, reviewed documented beta blocker date and time   History of Anesthesia Complications (+) PONV  Airway Mallampati: II  TM Distance: <3 FB Neck ROM: Full    Dental no notable dental hx.    Pulmonary COPD Reactive airway disease breath sounds clear to auscultation  Pulmonary exam normal       Cardiovascular hypertension, Normal cardiovascular exam    Neuro/Psych  Neuromuscular disease    GI/Hepatic Neg liver ROS, GERD-  Medicated and Controlled,  Endo/Other  Hypothyroidism Hx of Pituitary mass  Renal/GU negative Renal ROS     Musculoskeletal  (+) Arthritis -, Osteoarthritis,    Abdominal Normal abdominal exam  (+)   Peds  Hematology negative hematology ROS (+)   Anesthesia Other Findings   Reproductive/Obstetrics                            Anesthesia Physical Anesthesia Plan  ASA: III  Anesthesia Plan: General   Post-op Pain Management:    Induction: Intravenous  Airway Management Planned:   Additional Equipment:   Intra-op Plan:   Post-operative Plan: Extubation in OR  Informed Consent: I have reviewed the patients History and Physical, chart, labs and discussed the procedure including the risks, benefits and alternatives for the proposed anesthesia with the patient or authorized representative who has indicated his/her understanding and acceptance.   Dental advisory given  Plan Discussed with: CRNA and Surgeon  Anesthesia Plan Comments:         Anesthesia Quick Evaluation

## 2015-05-19 NOTE — Anesthesia Postprocedure Evaluation (Signed)
  Anesthesia Post-op Note  Patient: Laura Walters  Procedure(s) Performed: Procedure(s): Right knee arthrosocpy medail and lateral menisectomy, chondroplasty  (Right)  Anesthesia type:General  Patient location: PACU  Post pain: Pain level controlled  Post assessment: Post-op Vital signs reviewed, Patient's Cardiovascular Status Stable, Respiratory Function Stable, Patent Airway and No signs of Nausea or vomiting  Post vital signs: Reviewed and stable  Last Vitals:  Filed Vitals:   05/19/15 1604  BP: 149/80  Pulse: 71  Temp:   Resp: 18    Level of consciousness: awake, alert  and patient cooperative  Complications: No apparent anesthesia complications

## 2015-05-19 NOTE — H&P (Signed)
The patient has been re-examined, and the chart reviewed, and there have been no interval changes to the documented history and physical.    The risks, benefits, and alternatives have been discussed at length. The patient expressed understanding of the risks benefits and agreed with plans for surgical intervention.  Gil Ingwersen P. Anmol Fleck, Jr. M.D.    

## 2015-05-19 NOTE — Discharge Instructions (Signed)
°  Instructions after Knee Arthroscopy  ° °- James P. Hooten, Jr., M.D.    ° Dept. of Orthopaedics & Sports Medicine ° [facility] ° [address] ° [place], [place]  [address] ° ° Phone: [phone]   Fax: [phone] ° ° °DIET: °• Drink plenty of non-alcoholic fluids & begin a light diet. °• Resume your normal diet the day after surgery. ° °ACTIVITY:  °• You may use crutches or a walker with weight-bearing as tolerated, unless instructed otherwise. °• You may wean yourself off of the walker or crutches as tolerated.  °• Begin doing gentle exercises. Exercising will reduce the pain and swelling, increase motion, and prevent muscle weakness.   °• Avoid strenuous activities or athletics for a minimum of 4-6 weeks after arthroscopic surgery. °• Do not drive or operate any equipment until instructed. ° °WOUND CARE:  °• Place one to two pillows under the knee the first day or two when sitting or lying.  °• Continue to use the ice packs periodically to reduce pain and swelling. °• The small incisions in your knee are closed with nylon stitches. The stitches will be removed in the office. °• The bulky dressing may be removed on the second day after surgery. DO NOT TOUCH THE STITCHES. Put a Band-Aid over each stitch. Do NOT use any ointments or creams on the incisions.  °• You may bathe or shower after the stitches are removed at the first office visit following surgery. ° °MEDICATIONS: °• You may resume your regular medications. °• Please take the pain medication as prescribed. °• Do not take pain medication on an empty stomach. °• Do not drive or drink alcoholic beverages when taking pain medications. ° °CALL THE OFFICE FOR: °• Temperature above 101 degrees °• Excessive bleeding or drainage on the dressing. °• Excessive swelling, coldness, or paleness of the toes. °• Persistent nausea and vomiting. ° °FOLLOW-UP:  °• You should have an appointment to return to the office in 7-10 days after surgery.   ° ° °AMBULATORY SURGERY  °DISCHARGE INSTRUCTIONS ° ° °1) The drugs that you were given will stay in your system until tomorrow so for the next 24 hours you should not: ° °A) Drive an automobile °B) Make any legal decisions °C) Drink any alcoholic beverage ° ° °2) You may resume regular meals tomorrow.  Today it is better to start with liquids and gradually work up to solid foods. ° °You may eat anything you prefer, but it is better to start with liquids, then soup and crackers, and gradually work up to solid foods. ° ° °3) Please notify your doctor immediately if you have any unusual bleeding, trouble breathing, redness and pain at the surgery site, drainage, fever, or pain not relieved by medication. ° ° ° °4) Additional Instructions: ° ° ° ° ° ° ° °Please contact your physician with any problems or Same Day Surgery at [phone], Monday through Friday 6 am to 4 pm, or [facility] [place] Main number at [phone]. ° °

## 2015-05-19 NOTE — Brief Op Note (Signed)
05/19/2015  2:49 PM  PATIENT:  Laura Walters  81 y.o. 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   Delay start of Pharmacological VTE agent (>24hrs) due to surgical blood loss or risk of bleeding: not applicable

## 2015-05-19 NOTE — Op Note (Signed)
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   ASSISTANT: none  ANESTHESIA: general  ESTIMATED BLOOD LOSS: Minimal  FLUIDS REPLACED: 700 mL of crystalloid  TOURNIQUET TIME: Not used   DRAINS: none  IMPLANTS UTILIZED: None  INDICATIONS FOR SURGERY: Laura Walters is a 81 y.o. year old female who has been seen for complaints of right knee pain. MRI demonstrated findings consistent with meniscal pathology. After discussion of the risks and benefits of surgical intervention, the patient expressed understanding of the risks benefits and agree with plans for right knee arthroscopy.   PROCEDURE IN DETAIL: The patient was brought into the operating room and, after adequate general anesthesia was achieved, a tourniquet was applied to the right thigh and the leg was placed in the leg holder. All bony prominences were well padded. The patient's right knee was cleaned and prepped with alcohol and Duraprep and draped in the usual sterile fashion. A "timeout" was performed as per usual protocol. The anticipated portal sites were injected with 0.25% Marcaine with epinephrine. An anterolateral incision was made and a cannula was inserted. A large effusion was evacuated and the knee was distended with fluid using the pump. The scope was advanced down the medial gutter into the medial compartment. Under visualization with the scope, an anteromedial portal was  created and a hooked probe was inserted. The medial meniscus was visualized and probed. There was a degenerative tear involving the posterior horn of the medial meniscus. The tear was debrided using meniscal punches and a 4.5 mm incisor shaver. Final contouring was performed using the 50 ArthroCare wand. The remaining rim of meniscus was probed and felt to be stable. The articular cartilage was visualized. Generalized fraying of the articular cartilage was noted to both the medial femoral condyle and medial tibial plateau consistent with grade 3 chondromalacia. These areas were debrided and contoured using the 50 ArthroCare wand.  The scope was then advanced into the intercondylar notch. The anterior cruciate ligament was visualized and probed and felt to be intact. The scope was removed from the lateral portal and reinserted via the anteromedial portal to better visualize the lateral compartment. The lateral meniscus was visualized and probed. There was a complex degenerative tear involving the anterior and posterior horns of the lateral meniscus. The tears were debrided using meniscal punches and a 4.5 mm incisor shaver. Final contouring was performed using the 50 ArthroCare wand. Remaining rim of meniscus was probed and felt to be stable. The articular cartilage of the lateral compartment was visualized. Fraying of the articular cartilage was noted to both the lateral femoral condyle and lateral tibial plateau consistent with grade 3 chondromalacia. These areas were debrided and contoured using the 50 ArthroCare wand. Finally, the scope was advanced so as to visualize the patellofemoral articulation. Good patellar tracking was appreciated. The articular surface was in reasonably good condition.  The knee was irrigated with copius amounts of fluid and suctioned dry. The anterolateral portal  was re-approximated with #3-0 nylon. A combination of 0.25% Marcaine with epinephrine and 4 mg of Morphine were  injected via the scope. The scope was removed and the anteromedial portal was re-approximated with #3-0 nylon. A sterile dressing was applied followed by application of an ice wrap.  The patient tolerated the procedure well and was transported to the PACU in stable condition.  James P. Holley Bouche., M.D.

## 2015-05-19 NOTE — Transfer of Care (Signed)
Immediate Anesthesia Transfer of Care Note  Patient: Laura Walters  Procedure(s) Performed: Procedure(s): Right knee arthrosocpy medail and lateral menisectomy, chondroplasty  (Right)  Patient Location: PACU  Anesthesia Type:Regional  Level of Consciousness: sedated  Airway & Oxygen Therapy: Patient Spontanous Breathing and Patient connected to face mask oxygen  Post-op Assessment: Report given to RN  Post vital signs: Reviewed  Last Vitals:  Filed Vitals:   05/19/15 1450  BP: 154/83  Pulse: 65  Temp: 36.2 C  Resp: 15    Complications: No apparent anesthesia complications

## 2015-05-19 NOTE — Anesthesia Procedure Notes (Signed)
Procedure Name: LMA Insertion Date/Time: 05/19/2015 1:19 PM Performed by: Rolla Plate Pre-anesthesia Checklist: Patient identified, Patient being monitored, Timeout performed, Emergency Drugs available and Suction available Patient Re-evaluated:Patient Re-evaluated prior to inductionOxygen Delivery Method: Circle system utilized Preoxygenation: Pre-oxygenation with 100% oxygen Intubation Type: IV induction LMA: LMA inserted LMA Size: 3.0 Number of attempts: 1 Placement Confirmation: positive ETCO2 and breath sounds checked- equal and bilateral Tube secured with: Tape Dental Injury: Teeth and Oropharynx as per pre-operative assessment

## 2015-05-20 ENCOUNTER — Encounter: Payer: Self-pay | Admitting: Orthopedic Surgery

## 2015-05-30 ENCOUNTER — Other Ambulatory Visit: Payer: Self-pay | Admitting: Ophthalmology

## 2015-05-30 DIAGNOSIS — H04203 Unspecified epiphora, bilateral lacrimal glands: Secondary | ICD-10-CM

## 2015-06-05 ENCOUNTER — Ambulatory Visit
Admission: RE | Admit: 2015-06-05 | Discharge: 2015-06-05 | Disposition: A | Discharge status: Home / Self Care | Payer: Medicare Other | Source: Ambulatory Visit | Attending: Ophthalmology | Admitting: Ophthalmology

## 2015-06-05 DIAGNOSIS — H04203 Unspecified epiphora, bilateral lacrimal glands: Secondary | ICD-10-CM | POA: Insufficient documentation

## 2015-06-05 MED ORDER — SODIUM PERTECHNETATE TC 99M INJECTION
0.4760 | Freq: Once | INTRAVENOUS | Status: AC | PRN
  Administered 2015-06-05: 0.476 via INTRAVENOUS

## 2015-06-06 ENCOUNTER — Ambulatory Visit: Payer: Medicare Other | Attending: Orthopedic Surgery | Admitting: Physical Therapy

## 2015-06-06 ENCOUNTER — Encounter: Payer: Self-pay | Admitting: Physical Therapy

## 2015-06-06 DIAGNOSIS — M25552 Pain in left hip: Secondary | ICD-10-CM

## 2015-06-06 DIAGNOSIS — R262 Difficulty in walking, not elsewhere classified: Secondary | ICD-10-CM | POA: Insufficient documentation

## 2015-06-06 DIAGNOSIS — M6281 Muscle weakness (generalized): Secondary | ICD-10-CM | POA: Insufficient documentation

## 2015-06-06 DIAGNOSIS — M25661 Stiffness of right knee, not elsewhere classified: Secondary | ICD-10-CM | POA: Insufficient documentation

## 2015-06-06 NOTE — Therapy (Signed)
[facility] [address] [place], [name], [address] Phone: [phone]   Fax:  [phone]  Physical Therapy Evaluation  Patient Details  Name: Laura Walters MRN: [id number] Date of Birth: 1933/01/12 Referring Provider:  Dereck Leep, MD  Encounter Date: 06/06/2015      PT End of Session - 06/06/15 1712    Visit Number 1   Number of Visits 8   Date for PT Re-Evaluation 07/04/15   Authorization - Visit Number 1   Authorization - Number of Visits 10   PT Start Time 2330   PT Stop Time 1359   PT Time Calculation (min) 63 min   Equipment Utilized During Treatment Gait belt   Activity Tolerance Patient tolerated treatment well;Patient limited by pain   Behavior During Therapy [facility] for tasks assessed/performed      Past Medical History  Diagnosis Date  . Arthritis   . Cardiomyopathy     idiopathic  . Hypertension   . Neuropathy   . Hypothyroidism   . Cancer of thyroid 03/30/2015  . DDD (degenerative disc disease), lumbar   . RAD (reactive airway disease)   . Chest pain     non cardiac    Past Surgical History  Procedure Laterality Date  . Pituitary surgery    . Thyroidectomy    . Hemorroidectomy      x 2  . Parotidectomy    . Dilation and curettage of uterus    . Left total hip arthroplasty    . Knee arthroscopy Right 05/19/2015    Procedure: Right knee arthrosocpy medail and lateral menisectomy, chondroplasty ;  Surgeon: Dereck Leep, MD;  Location: [facility] ORS;  Service: Orthopedics;  Laterality: Right;    There were no vitals filed for this visit.  Visit Diagnosis:  Muscle weakness  Difficulty walking  Left hip pain  Joint stiffness of knee, right      Subjective Assessment - 06/06/15 1710    Subjective Pt reports no R knee pain with any activity and that she has been walking with SPC since surgery. Pt states that her L leg is sore.    Limitations Sitting;Lifting;Walking;Standing   Patient Stated Goals  increase strength with walking and daily tasks   Currently in Pain? Yes   Pain Score 4    Pain Location Hip   Pain Orientation Left   Pain Descriptors / Indicators Aching   Pain Type Chronic pain   Pain Onset More than a month ago   Pain Frequency Constant            [facility] PT Assessment - 06/06/15 0001    Assessment   Medical Diagnosis R meniscal repair   Onset Date/Surgical Date 05/19/15   Next MD Visit --  06/30/15   Prior Therapy see previous PT notes   Restrictions   Weight Bearing Restrictions No   Balance Screen   Has the patient fallen in the past 6 months Yes    OBJECTIVE: AROM knee in supine: L 0-119 deg, R -6-119 deg. LEFS: 39/80 There ex: supine hip abduction/quad set with 10 second hold/straight leg raise R LE x 10 each (provided as HEP). Ice to decrease R knee inflammation to end the session.         PT Education - 06/06/15 1711    Education provided Yes   Education Details Pt given quad sets, straight leg raise and supine hip abduction as exercises for R LE.  Pt educated  on proper SPC positioning and gait sequence with R knee and L UE.   Person(s) Educated Patient   Methods Explanation;Handout;Verbal cues;Demonstration   Comprehension Verbalized understanding;Returned demonstration           PT Long Term Goals - 2015-07-02 1720    PT LONG TERM GOAL #1   Title Pt. I with HEP to increase LE strength by 1/2 muscle grade in order to walk community distances.    Baseline L hip flexion: 3/5, R hamstring 3/5 B grossly 4/5   Time 4   Period Weeks   Status New   PT LONG TERM GOAL #2   Title Pt. will increase LEFS and score >55 out of 80 to improve pain-free functional mobility.    Baseline 39/80 on 02-Jul-2023   Time 4   Period Weeks   Status New   PT LONG TERM GOAL #3   Title Pt will complete a TUG in < 14 seconds with SPC to increase safety with community ambulation.    Time 4   Period Weeks   Status New   PT LONG TERM GOAL #4   Title Pt. will ambulate  community distances with recip. gait pattern with use of SPC and proper posture to improve overall mobility.     Baseline --   Time 4   Period Weeks   Status New             Plan - Jul 02, 2015 1712    Clinical Impression Statement Pt is a 81 y.o F s/p R menical repair (DOS 05/19/15). Pt reports no pain in R knee at this time. Pt ambulates with a SPC in her R UE and educated on importance of using in her L UE for R knee. Pt ambulates with a step through gait pattern with decreased hip flexion, step length and stance time bilaterally. Pt's knee AROM in supine: L 0-119 degress, R -6-119 degrees. MMT: L LE grossly 4/5 except L hip flexion 3/5; R LE grossly 4/5 except R knee flexion 3/5. Circumfrential measurements: Joint line L/R: 39 cm/41/5 cm, distal quad L/R: 40.4 cm/ 40 cm, mid calf L/R: 35 cm/36.5 cm. LEFS: 39/80. Pt will benefit from short term skilled PT to address gait, strength and balance deficits in order to improve independence with functional mobility.    Pt will benefit from skilled therapeutic intervention in order to improve on the following deficits Abnormal gait;Decreased activity tolerance;Decreased balance;Decreased mobility;Increased edema;Decreased strength;Postural dysfunction;Improper body mechanics;Pain;Difficulty walking;Decreased range of motion;Decreased endurance;Hypomobility   Rehab Potential Good   PT Frequency 2x / week   PT Duration 4 weeks   PT Treatment/Interventions ADLs/Self Care Home Management;Cryotherapy;Moist Heat;Therapeutic exercise;Balance training;Therapeutic activities;Manual techniques;Functional mobility training;Stair training;Gait training;Patient/family education;Neuromuscular re-education;Passive range of motion   PT Next Visit Plan continue closed chain strengthening/gait/balance/ progress pt to standing HEP   PT Home Exercise Plan handout with SLR, quad set, hip abduction    Consulted and Agree with Plan of Care Patient          G-Codes -  07/02/15 1735    Functional Assessment Tool Used LEFS/ gait pattern/ pain/ clinical judgement   Functional Limitation Mobility: Walking and moving around   Mobility: Walking and Moving Around Current Status (X8338) At least 40 percent but less than 60 percent impaired, limited or restricted   Mobility: Walking and Moving Around Goal Status (S5053) At least 20 percent but less than 40 percent impaired, limited or restricted       Problem List Patient Active Problem  List   Diagnosis Date Noted  . LBP (low back pain) 03/30/2015  . Neuropathy 03/30/2015  . Chest pain, non-cardiac 03/30/2015  . Neoplasm of pituitary gland 03/30/2015  . Cancer of thyroid 03/30/2015  . Combined fat and carbohydrate induced hyperlipemia 10/17/2014    Lavone Neri, SPT  06/06/2015, 5:25 PM   [facility] [address] [place], [name], [address] Phone: [phone]   Fax:  [phone]

## 2015-06-09 ENCOUNTER — Ambulatory Visit: Payer: Medicare Other | Admitting: Physical Therapy

## 2015-06-11 ENCOUNTER — Ambulatory Visit: Payer: Medicare Other | Admitting: Physical Therapy

## 2015-06-12 ENCOUNTER — Ambulatory Visit: Payer: Medicare Other | Admitting: Physical Therapy

## 2015-06-12 ENCOUNTER — Encounter: Payer: Self-pay | Admitting: Physical Therapy

## 2015-06-12 DIAGNOSIS — M6281 Muscle weakness (generalized): Secondary | ICD-10-CM

## 2015-06-12 DIAGNOSIS — M25661 Stiffness of right knee, not elsewhere classified: Secondary | ICD-10-CM

## 2015-06-12 DIAGNOSIS — M25552 Pain in left hip: Secondary | ICD-10-CM

## 2015-06-12 DIAGNOSIS — R262 Difficulty in walking, not elsewhere classified: Secondary | ICD-10-CM

## 2015-06-12 NOTE — Therapy (Signed)
[facility] [address] [place], [name], [address] Phone: [phone]   Fax:  [phone]  Physical Therapy Treatment  Patient Details  Name: Laura Walters MRN: [id number] Date of Birth: Aug 10, 1933 Referring Provider:  Dereck Leep, MD  Encounter Date: 06/12/2015      PT End of Session - 06/12/15 1200    Visit Number 2   Number of Visits 8   Date for PT Re-Evaluation 07/04/15   Authorization - Visit Number 2   Authorization - Number of Visits 10   PT Start Time 2094   PT Stop Time 1132   PT Time Calculation (min) 48 min   Equipment Utilized During Treatment Gait belt   Activity Tolerance Patient tolerated treatment well;Patient limited by pain   Behavior During Therapy [facility] for tasks assessed/performed      Past Medical History  Diagnosis Date  . Arthritis   . Cardiomyopathy     idiopathic  . Hypertension   . Neuropathy   . Hypothyroidism   . Cancer of thyroid 03/30/2015  . DDD (degenerative disc disease), lumbar   . RAD (reactive airway disease)   . Chest pain     non cardiac    Past Surgical History  Procedure Laterality Date  . Pituitary surgery    . Thyroidectomy    . Hemorroidectomy      x 2  . Parotidectomy    . Dilation and curettage of uterus    . Left total hip arthroplasty    . Knee arthroscopy Right 05/19/2015    Procedure: Right knee arthrosocpy medail and lateral menisectomy, chondroplasty ;  Surgeon: Dereck Leep, MD;  Location: [facility] ORS;  Service: Orthopedics;  Laterality: Right;    There were no vitals filed for this visit.  Visit Diagnosis:  Difficulty walking  Muscle weakness  Left hip pain  Joint stiffness of knee, right      Subjective Assessment - 06/12/15 1159    Subjective Pt reports no pain and no new complaints upon arrival to PT tx session. Pt ambulates with SPC unless in her yard at home where she ambulates with RW.    Limitations Sitting;Lifting;Walking;Standing   Patient Stated Goals increase strength with walking and daily tasks   Currently in Pain? No/denies        OBJECTIVE: There ex: Nustep L7 10 min (warm-up, no charge). Step overs forwards and sideways on 3" step with light touch UE support x 30 each direction. Squats x 15 (mod verbal cues to keep knees behind her toes). Toe raises/heel raises on blue airex x 30 each. Single leg stance for 20 seconds with unilateral UE support x 2 trials. Gait training: 6 MWT 750'. Walking on asphalt with SPC and CGA (moderate verbal cues to increase step length). Heel to toe walking F/B in parallel bars with B UE support to increase hip motion with gait.   Pt response to Tx for medical necessity: Limited endurance with gait but no c/o increased pain throughout session. Responds to balance challenges appropriately and continues to progress, now able to hold SLS with one UE support as compared to before when needed B UE support.        PT Long Term Goals - 06/06/15 1720    PT LONG TERM GOAL #1   Title Pt. I with HEP to increase LE strength by 1/2 muscle grade in order to walk community distances.    Baseline L hip flexion: 3/5, R hamstring  3/5 B grossly 4/5   Time 4   Period Weeks   Status New   PT LONG TERM GOAL #2   Title Pt. will increase LEFS and score >55 out of 80 to improve pain-free functional mobility.    Baseline 39/80 on 9/9   Time 4   Period Weeks   Status New   PT LONG TERM GOAL #3   Title Pt will complete a TUG in < 14 seconds with SPC to increase safety with community ambulation.    Time 4   Period Weeks   Status New   PT LONG TERM GOAL #4   Title Pt. will ambulate community distances with recip. gait pattern with use of SPC and proper posture to improve overall mobility.     Baseline --   Time 4   Period Weeks   Status New               Plan - 06/12/15 1200    Clinical Impression Statement Pt is able to lift L hip with stepping over activities inconsistently to clear 3"  step. Pt ambulates with SPC and decreased cadence and short step length B on level and unlevel surfaces. Pt completed 6 MWT with no rest breaks and a distance of 450'. Pt balance has improved and she is able to stand on one leg with unilateral hand support for 20 seconds.    Pt will benefit from skilled therapeutic intervention in order to improve on the following deficits Abnormal gait;Decreased activity tolerance;Decreased balance;Decreased mobility;Increased edema;Decreased strength;Postural dysfunction;Improper body mechanics;Pain;Difficulty walking;Decreased range of motion;Decreased endurance;Hypomobility   Rehab Potential Good   PT Frequency 2x / week   PT Duration 4 weeks   PT Treatment/Interventions ADLs/Self Care Home Management;Cryotherapy;Moist Heat;Therapeutic exercise;Balance training;Therapeutic activities;Manual techniques;Functional mobility training;Stair training;Gait training;Patient/family education;Neuromuscular re-education;Passive range of motion   PT Next Visit Plan Gait with increased speed/balance with functional challenges   PT Home Exercise Plan continue exercises and walking.    Consulted and Agree with Plan of Care Patient        Problem List Patient Active Problem List   Diagnosis Date Noted  . LBP (low back pain) 03/30/2015  . Neuropathy 03/30/2015  . Chest pain, non-cardiac 03/30/2015  . Neoplasm of pituitary gland 03/30/2015  . Cancer of thyroid 03/30/2015  . Combined fat and carbohydrate induced hyperlipemia 10/17/2014   Pura Spice, PT, DPT # [phone]   06/12/2015, 12:41 PM  [facility] [address] [place], [name], [address] Phone: [phone]   Fax:  [phone]

## 2015-06-16 ENCOUNTER — Ambulatory Visit: Payer: Medicare Other | Admitting: Physical Therapy

## 2015-06-16 ENCOUNTER — Encounter: Payer: Self-pay | Admitting: Physical Therapy

## 2015-06-16 DIAGNOSIS — M6281 Muscle weakness (generalized): Secondary | ICD-10-CM | POA: Diagnosis not present

## 2015-06-16 DIAGNOSIS — R262 Difficulty in walking, not elsewhere classified: Secondary | ICD-10-CM

## 2015-06-16 DIAGNOSIS — M25661 Stiffness of right knee, not elsewhere classified: Secondary | ICD-10-CM

## 2015-06-16 DIAGNOSIS — M25552 Pain in left hip: Secondary | ICD-10-CM

## 2015-06-16 NOTE — Therapy (Signed)
[facility] [address] [place], [name], [address] Phone: [phone]   Fax:  [phone]  Physical Therapy Treatment  Patient Details  Name: Laura Walters MRN: [id number] Date of Birth: May 08, 1933 Referring Ellias Mcelreath:  Dereck Leep, MD  Encounter Date: 06/16/2015      PT End of Session - 06/16/15 1410    Visit Number 3   Number of Visits 8   Date for PT Re-Evaluation 07/04/15   Authorization - Visit Number 3   Authorization - Number of Visits 10   PT Start Time 7371   PT Stop Time 1346   PT Time Calculation (min) 52 min   Equipment Utilized During Treatment Gait belt   Activity Tolerance Patient tolerated treatment well;Patient limited by fatigue   Behavior During Therapy [facility] for tasks assessed/performed      Past Medical History  Diagnosis Date  . Arthritis   . Cardiomyopathy     idiopathic  . Hypertension   . Neuropathy   . Hypothyroidism   . Cancer of thyroid 03/30/2015  . DDD (degenerative disc disease), lumbar   . RAD (reactive airway disease)   . Chest pain     non cardiac    Past Surgical History  Procedure Laterality Date  . Pituitary surgery    . Thyroidectomy    . Hemorroidectomy      x 2  . Parotidectomy    . Dilation and curettage of uterus    . Left total hip arthroplasty    . Knee arthroscopy Right 05/19/2015    Procedure: Right knee arthrosocpy medail and lateral menisectomy, chondroplasty ;  Surgeon: Dereck Leep, MD;  Location: [facility] ORS;  Service: Orthopedics;  Laterality: Right;    There were no vitals filed for this visit.  Visit Diagnosis:  Difficulty walking  Muscle weakness  Left hip pain  Joint stiffness of knee, right      Subjective Assessment - 06/16/15 1409    Subjective Pt reports no pain but soreness in R LE today. Pt is concerned about the possibility of a blood clot but upon examination just has increased swelling of R LE.   Limitations  Sitting;Lifting;Walking;Standing   Patient Stated Goals increase strength with walking and daily tasks   Currently in Pain? No/denies        OBJECTIVE: There ex: Nustep L8 10 mins (warm-up, no charge). In // bars: braiding/high knees x 5 each. Reviewed HEP. Gait: hallway ambulation for 280' with SPC and occasional CGA to focus on increased heel strike, equal step length and variable cadence. Neuro re-ed: On blue Airex: semi-tandem balance with alternating cross body reaching (moderate PT cuing for posture/ wt. Shifting).   Pt response to Tx for medical necessity: Continued improvement in endurance with decreased seated rest breaks but more frequent standing rest breaks. Improved ambulation with reciprocal gait pattern.          PT Long Term Goals - 06/06/15 1720    PT LONG TERM GOAL #1   Title Pt. I with HEP to increase LE strength by 1/2 muscle grade in order to walk community distances.    Baseline L hip flexion: 3/5, R hamstring 3/5 B grossly 4/5   Time 4   Period Weeks   Status New   PT LONG TERM GOAL #2   Title Pt. will increase LEFS and score >55 out of 80 to improve pain-free functional mobility.    Baseline 39/80 on 9/9   Time  4   Period Weeks   Status New   PT LONG TERM GOAL #3   Title Pt will complete a TUG in < 14 seconds with SPC to increase safety with community ambulation.    Time 4   Period Weeks   Status New   PT LONG TERM GOAL #4   Title Pt. will ambulate community distances with recip. gait pattern with use of SPC and proper posture to improve overall mobility.     Baseline --   Time 4   Period Weeks   Status New               Plan - 06/16/15 1411    Clinical Impression Statement Pt presents with increased pitting edema and tenderness to palaption in the R LE. Pt dorsal pulse is present bilaterally. Pt ambulates with SPC and increased hip and knee flexion as compared to initial evaluation. Pt is able to consistently step with initial contact at  heel strike with moderate verbal cuing. Pt demonstrates the ability to change cadence today upon request with SPC. Pt is able to hold semi-tandem stance and perform UE reaching with occasional CGA on blue airex pad.    Pt will benefit from skilled therapeutic intervention in order to improve on the following deficits Abnormal gait;Decreased activity tolerance;Decreased balance;Decreased mobility;Increased edema;Decreased strength;Postural dysfunction;Improper body mechanics;Pain;Difficulty walking;Decreased range of motion;Decreased endurance;Hypomobility   Rehab Potential Good   PT Frequency 2x / week   PT Duration 4 weeks   PT Treatment/Interventions ADLs/Self Care Home Management;Cryotherapy;Moist Heat;Therapeutic exercise;Balance training;Therapeutic activities;Manual techniques;Functional mobility training;Stair training;Gait training;Patient/family education;Neuromuscular re-education;Passive range of motion   PT Next Visit Plan Gait with increased speed/balance with functional challenges   PT Home Exercise Plan continue exercises and walking.    Consulted and Agree with Plan of Care Patient        Problem List Patient Active Problem List   Diagnosis Date Noted  . LBP (low back pain) 03/30/2015  . Neuropathy 03/30/2015  . Chest pain, non-cardiac 03/30/2015  . Neoplasm of pituitary gland 03/30/2015  . Cancer of thyroid 03/30/2015  . Combined fat and carbohydrate induced hyperlipemia 10/17/2014   Pura Spice, PT, DPT # [phone]   06/16/2015, 3:08 PM  [place] [facility] [address] [place], [name], [address] Phone: [phone]   Fax:  [phone]

## 2015-06-18 ENCOUNTER — Ambulatory Visit: Payer: Medicare Other | Admitting: Physical Therapy

## 2015-06-18 DIAGNOSIS — M6281 Muscle weakness (generalized): Secondary | ICD-10-CM

## 2015-06-18 DIAGNOSIS — R262 Difficulty in walking, not elsewhere classified: Secondary | ICD-10-CM

## 2015-06-18 DIAGNOSIS — M25552 Pain in left hip: Secondary | ICD-10-CM

## 2015-06-18 DIAGNOSIS — M25661 Stiffness of right knee, not elsewhere classified: Secondary | ICD-10-CM

## 2015-06-18 NOTE — Therapy (Signed)
[facility] [address] [place], [name], [address] Phone: [phone]   Fax:  [phone]  Physical Therapy Treatment  Patient Details  Name: Laura Walters MRN: [id number] Date of Birth: [birth year] Referring Provider:  Dereck Leep, MD  Encounter Date: 06/18/2015      PT End of Session - 06/19/15 0742    Visit Number 4   Number of Visits 8   Date for PT Re-Evaluation 07/04/15   Authorization - Visit Number 4   Authorization - Number of Visits 10   PT Start Time 3500   PT Stop Time 1357   PT Time Calculation (min) 59 min   Equipment Utilized During Treatment Gait belt   Activity Tolerance Patient tolerated treatment well;Patient limited by fatigue   Behavior During Therapy [facility] for tasks assessed/performed      Past Medical History  Diagnosis Date  . Arthritis   . Cardiomyopathy     idiopathic  . Hypertension   . Neuropathy   . Hypothyroidism   . Cancer of thyroid 03/30/2015  . DDD (degenerative disc disease), lumbar   . RAD (reactive airway disease)   . Chest pain     non cardiac    Past Surgical History  Procedure Laterality Date  . Pituitary surgery    . Thyroidectomy    . Hemorroidectomy      x 2  . Parotidectomy    . Dilation and curettage of uterus    . Left total hip arthroplasty    . Knee arthroscopy Right 05/19/2015    Procedure: Right knee arthrosocpy medail and lateral menisectomy, chondroplasty ;  Surgeon: Dereck Leep, MD;  Location: [facility] ORS;  Service: Orthopedics;  Laterality: Right;    There were no vitals filed for this visit.  Visit Diagnosis:  Difficulty walking  Muscle weakness  Joint stiffness of knee, right  Left hip pain      Subjective Assessment - 06/18/15 1258    Subjective Pt. reports 2/10 B LE pain and states her swelling is much better since wearing TED hose (day and night).  No falls.     Limitations Sitting;Lifting;Walking;Standing   How long can you stand  comfortably? around 5+ minutes   Patient Stated Goals increase strength with walking and daily tasks   Currently in Pain? Yes   Pain Score 2    Pain Location Leg   Pain Orientation Right;Left           OBJECTIVE: There ex: Nustep L7 10 min. (warm-up, no charge).   Step ups and step overs 6" step with focus on increased L hip flexion.  Neuro:  Single leg stance cone taps to increase B hip flexion and improved ability to maintain stance with gait.  There.ex.:  Standing hip abduction x 10 each side. Sled push/pull x 30 feet each way with 50# on the sled.  Gait/Neuro: Obstacle course navigation with step overs, cone weaving and side stepping (CGA and SPC to complete; min verbal and tactile cues to perform side stepping without catching her toes on the obstacles). Forwards and Backwards walking in the // bars to work on hip flexion and extension.  Moderate cuing for proper recip. Step pattern/ heel strike.  Pt response to Tx for medical necessity:  Improved ambulation with reciprocal gait pattern. Increased hip flexion with gait but unable to maintain a static L hip flexion hold against gravity.         PT Education - 06/19/15  25    Education provided Yes   Education Details Pt educated on importance of continuing HEP and reinstructed in home exercises. Pt told if she could not find previous handouts to let PT know next visit so it could be reprinted.    Person(s) Educated Patient   Methods Explanation   Comprehension Verbalized understanding             PT Long Term Goals - 06/06/15 1720    PT LONG TERM GOAL #1   Title Pt. I with HEP to increase LE strength by 1/2 muscle grade in order to walk community distances.    Baseline L hip flexion: 3/5, R hamstring 3/5 B grossly 4/5   Time 4   Period Weeks   Status New   PT LONG TERM GOAL #2   Title Pt. will increase LEFS and score >55 out of 80 to improve pain-free functional mobility.    Baseline 39/80 on 9/9   Time 4   Period  Weeks   Status New   PT LONG TERM GOAL #3   Title Pt will complete a TUG in < 14 seconds with SPC to increase safety with community ambulation.    Time 4   Period Weeks   Status New   PT LONG TERM GOAL #4   Title Pt. will ambulate community distances with recip. gait pattern with use of SPC and proper posture to improve overall mobility.     Baseline --   Time 4   Period Weeks   Status New             Plan - 06/19/15 2130    Clinical Impression Statement Pt ambulates with increased hip flexion as compared to initial evaluation but still has decreased hip flexion and hip abductor strength. Pt is able to navigate an obstacle course with SPC and tactile and verbal cues. Pt has difficulty side stepping over objects taller than 3". Pt progressing with endurance as noted by decreased rest breaks during PT tx session.   Pt will benefit from skilled therapeutic intervention in order to improve on the following deficits Abnormal gait;Decreased activity tolerance;Decreased balance;Decreased mobility;Increased edema;Decreased strength;Postural dysfunction;Improper body mechanics;Pain;Difficulty walking;Decreased range of motion;Decreased endurance;Hypomobility   Rehab Potential Good   PT Frequency 2x / week   PT Duration 4 weeks   PT Treatment/Interventions ADLs/Self Care Home Management;Cryotherapy;Moist Heat;Therapeutic exercise;Balance training;Therapeutic activities;Manual techniques;Functional mobility training;Stair training;Gait training;Patient/family education;Neuromuscular re-education;Passive range of motion   PT Next Visit Plan Gait with increased speed/balance with functional challenges   PT Home Exercise Plan continue exercises and walking.    Consulted and Agree with Plan of Care Patient        Problem List Patient Active Problem List   Diagnosis Date Noted  . LBP (low back pain) 03/30/2015  . Neuropathy 03/30/2015  . Chest pain, non-cardiac 03/30/2015  . Neoplasm of  pituitary gland 03/30/2015  . Cancer of thyroid 03/30/2015  . Combined fat and carbohydrate induced hyperlipemia 10/17/2014   Pura Spice, PT, DPT # [phone]   06/19/2015, 5:52 PM  [facility] [address] [place], [name], [address] Phone: [phone]   Fax:  [phone]

## 2015-06-19 ENCOUNTER — Encounter: Payer: Medicare Other | Admitting: Physical Therapy

## 2015-06-19 ENCOUNTER — Encounter: Payer: Self-pay | Admitting: Physical Therapy

## 2015-06-23 ENCOUNTER — Ambulatory Visit: Payer: Medicare Other | Admitting: Physical Therapy

## 2015-06-23 DIAGNOSIS — R262 Difficulty in walking, not elsewhere classified: Secondary | ICD-10-CM

## 2015-06-23 DIAGNOSIS — M6281 Muscle weakness (generalized): Secondary | ICD-10-CM | POA: Diagnosis not present

## 2015-06-23 DIAGNOSIS — M25661 Stiffness of right knee, not elsewhere classified: Secondary | ICD-10-CM

## 2015-06-23 DIAGNOSIS — M25552 Pain in left hip: Secondary | ICD-10-CM

## 2015-06-24 ENCOUNTER — Encounter: Payer: Self-pay | Admitting: Physical Therapy

## 2015-06-24 NOTE — Therapy (Signed)
[place] [address] [place], [name], [address] Phone: [phone]   Fax:  [phone]  Physical Therapy Treatment  Patient Details  Name: Laura Walters MRN: [id number] Date of Birth: [birth year] Referring Provider:  Dereck Leep, MD  Encounter Date: 06/23/2015      PT End of Session - 06/24/15 0744    Visit Number 5   Number of Visits 8   Date for PT Re-Evaluation 07/04/15   Authorization - Visit Number 5   Authorization - Number of Visits 10   PT Start Time 1302   PT Stop Time 0737   PT Time Calculation (min) 53 min   Equipment Utilized During Treatment Gait belt   Activity Tolerance Patient tolerated treatment well;Patient limited by fatigue   Behavior During Therapy [facility] for tasks assessed/performed      Past Medical History  Diagnosis Date  . Arthritis   . Cardiomyopathy     idiopathic  . Hypertension   . Neuropathy   . Hypothyroidism   . Cancer of thyroid 03/30/2015  . DDD (degenerative disc disease), lumbar   . RAD (reactive airway disease)   . Chest pain     non cardiac    Past Surgical History  Procedure Laterality Date  . Pituitary surgery    . Thyroidectomy    . Hemorroidectomy      x 2  . Parotidectomy    . Dilation and curettage of uterus    . Left total hip arthroplasty    . Knee arthroscopy Right 05/19/2015    Procedure: Right knee arthrosocpy medail and lateral menisectomy, chondroplasty ;  Surgeon: Dereck Leep, MD;  Location: [facility] ORS;  Service: Orthopedics;  Laterality: Right;    There were no vitals filed for this visit.  Visit Diagnosis:  Difficulty walking  Muscle weakness  Joint stiffness of knee, right  Left hip pain      Subjective Assessment - 06/24/15 0743    Subjective Pt reports no pain but is tired from her weekend activities. Pt states that she has not had any falls since last PT tx session.    Limitations Sitting;Lifting;Walking;Standing   How long can you stand  comfortably? around 5+ minutes   Patient Stated Goals increase strength with walking and daily tasks   Currently in Pain? No/denies      OBJECTIVE: There ex: Nustep L7 10 min. (warm-up, no charge).Walking marching to increase hip flexion. Seated hip flexion/heel raises/LAQ 10 x 2 with 2# weights. Step ups/downs with quad control and min. Use of UE assist on //-bars (mirror feedback for posture/ cuing).  Neuro:  Sit to stands from low chair with min. UE assist 10x.  Posture correction in sitting/ standing.  TUG: 18 seconds, 14 seconds average: 16 seconds.  Gait training: ambulate in clinic with use of SPC on level surfaces with cuing to increase heel strike/ knee flexion and proper head position.  Use of RW outside for safety on uneven terrain/ ramps and grassy surfaces.   Pt response to Tx for medical necessity: Unable to lift L hip flexion from seated as compared to R.  Benefiting from gait training to increase safety with functional mobility.          PT Long Term Goals - 06/06/15 1720    PT LONG TERM GOAL #1   Title Pt. I with HEP to increase LE strength by 1/2 muscle grade in order to walk community distances.    Baseline  L hip flexion: 3/5, R hamstring 3/5 B grossly 4/5   Time 4   Period Weeks   Status New   PT LONG TERM GOAL #2   Title Pt. will increase LEFS and score >55 out of 80 to improve pain-free functional mobility.    Baseline 39/80 on 9/9   Time 4   Period Weeks   Status New   PT LONG TERM GOAL #3   Title Pt will complete a TUG in < 14 seconds with SPC to increase safety with community ambulation.    Time 4   Period Weeks   Status New   PT LONG TERM GOAL #4   Title Pt. will ambulate community distances with recip. gait pattern with use of SPC and proper posture to improve overall mobility.     Baseline --   Time 4   Period Weeks   Status New            Plan - 06/24/15 0744    Clinical Impression Statement Pt ambulates with decreased L hip flexion and  decreased L foot clearance. Pt has difficulty with L hip flexion in sitting but is able to perform seted there ex with 2# weights on. Pt required frequent seated rest breaks. Pt made a slight regression as compared to last session but most likely secondary to fatigue.    Pt will benefit from skilled therapeutic intervention in order to improve on the following deficits Abnormal gait;Decreased activity tolerance;Decreased balance;Decreased mobility;Increased edema;Decreased strength;Postural dysfunction;Improper body mechanics;Pain;Difficulty walking;Decreased range of motion;Decreased endurance;Hypomobility   Rehab Potential Good   PT Frequency 2x / week   PT Duration 4 weeks   PT Treatment/Interventions ADLs/Self Care Home Management;Cryotherapy;Moist Heat;Therapeutic exercise;Balance training;Therapeutic activities;Manual techniques;Functional mobility training;Stair training;Gait training;Patient/family education;Neuromuscular re-education;Passive range of motion   PT Next Visit Plan Gait with increased speed/balance with functional challenges   PT Home Exercise Plan continue exercises and walking.    Consulted and Agree with Plan of Care Patient        Problem List Patient Active Problem List   Diagnosis Date Noted  . LBP (low back pain) 03/30/2015  . Neuropathy 03/30/2015  . Chest pain, non-cardiac 03/30/2015  . Neoplasm of pituitary gland 03/30/2015  . Cancer of thyroid 03/30/2015  . Combined fat and carbohydrate induced hyperlipemia 10/17/2014   Pura Spice, PT, DPT # [phone]   06/24/2015, 4:10 PM  [facility] [address] [place], [name], [address] Phone: [phone]   Fax:  [phone]

## 2015-06-25 ENCOUNTER — Ambulatory Visit: Payer: Medicare Other | Admitting: Physical Therapy

## 2015-06-25 DIAGNOSIS — R262 Difficulty in walking, not elsewhere classified: Secondary | ICD-10-CM

## 2015-06-25 DIAGNOSIS — M6281 Muscle weakness (generalized): Secondary | ICD-10-CM

## 2015-06-25 DIAGNOSIS — M25552 Pain in left hip: Secondary | ICD-10-CM

## 2015-06-25 DIAGNOSIS — M25661 Stiffness of right knee, not elsewhere classified: Secondary | ICD-10-CM

## 2015-06-26 ENCOUNTER — Encounter: Payer: Self-pay | Admitting: Physical Therapy

## 2015-06-26 ENCOUNTER — Encounter: Payer: Medicare Other | Admitting: Physical Therapy

## 2015-06-26 NOTE — Therapy (Signed)
[place] [facility] [address] [place], [name], [address] Phone: [phone]   Fax:  [phone]  Physical Therapy Treatment  Patient Details  Name: Laura Walters MRN: [id number] Date of Birth: [birth year] Referring Nusrat Encarnacion:  Dereck Leep, MD  Encounter Date: 06/25/2015      PT End of Session - 06/26/15 0955    Visit Number 6   Number of Visits 8   Date for PT Re-Evaluation 07/04/15   Authorization - Visit Number 6   Authorization - Number of Visits 10   PT Start Time 1302   PT Stop Time 1400   PT Time Calculation (min) 58 min   Equipment Utilized During Treatment Gait belt   Activity Tolerance Patient tolerated treatment well;Patient limited by fatigue   Behavior During Therapy [facility] for tasks assessed/performed      Past Medical History  Diagnosis Date  . Arthritis   . Cardiomyopathy     idiopathic  . Hypertension   . Neuropathy   . Hypothyroidism   . Cancer of thyroid 03/30/2015  . DDD (degenerative disc disease), lumbar   . RAD (reactive airway disease)   . Chest pain     non cardiac    Past Surgical History  Procedure Laterality Date  . Pituitary surgery    . Thyroidectomy    . Hemorroidectomy      x 2  . Parotidectomy    . Dilation and curettage of uterus    . Left total hip arthroplasty    . Knee arthroscopy Right 05/19/2015    Procedure: Right knee arthrosocpy medail and lateral menisectomy, chondroplasty ;  Surgeon: Dereck Leep, MD;  Location: [facility] ORS;  Service: Orthopedics;  Laterality: Right;    There were no vitals filed for this visit.  Visit Diagnosis:  Difficulty walking  Muscle weakness  Joint stiffness of knee, right  Left hip pain      Subjective Assessment - 06/26/15 0953    Subjective Pt reports no pain but still is noticing weakness and soreness in L hip. Pt states she is still tired but that at her MD appt today they decided to switch a medication to help with fatigue.    Limitations Sitting;Lifting;Walking;Standing   How long can you stand comfortably? around 5+ minutes   Patient Stated Goals increase strength with walking and daily tasks   Currently in Pain? No/denies       OBJECTIVE: Warm up: Nustep L7 for 10 mins steps at 45/min (no charge). There ex: Forwards and sideways step ups over 6" step 15 x 2 each direction with B UE support (decreased clearance with L hip noted sideways). Standing hip flexion in // bars with B UE support. Gait training: Ambulation with SPC and CGA over grass and sidewalks to ensure safety when ambulating outside in her yard. Verbal cues for L foot clearance, equal step length and L hip flexion with swing phase. Neuro re-ed: [employer] balance with dual tasking for cross body toe taps on color cones (pt had to match foot and color to reach; extra time required to match the correct foot).   Pt response to Tx for medical necessity: Decreased endurance and increased complaints of soreness noted throughout session. Benefits from PT to work on L hip clearance and foot clearance due to inconsistency with clearance.         PT Long Term Goals - 06/06/15 1720    PT LONG TERM GOAL #1   Title Pt.  I with HEP to increase LE strength by 1/2 muscle grade in order to walk community distances.    Baseline L hip flexion: 3/5, R hamstring 3/5 B grossly 4/5   Time 4   Period Weeks   Status New   PT LONG TERM GOAL #2   Title Pt. will increase LEFS and score >55 out of 80 to improve pain-free functional mobility.    Baseline 39/80 on 9/9   Time 4   Period Weeks   Status New   PT LONG TERM GOAL #3   Title Pt will complete a TUG in < 14 seconds with SPC to increase safety with community ambulation.    Time 4   Period Weeks   Status New   PT LONG TERM GOAL #4   Title Pt. will ambulate community distances with recip. gait pattern with use of SPC and proper posture to improve overall mobility.     Baseline --   Time 4   Period Weeks   Status New             Plan - 06/26/15 1856    Clinical Impression Statement Pt ambulates with SPC over grass and carpet with decreased cadence, decreased L hip flexion and inconsistent L foot clearance. Pt is able to perform step ups over 6" step but has difficulty clearing L LE without tapping the step when stepping sideways. Pt needs frequent rest breaks and demonstrates increased fatigue compare to previous weeks session. Pt L hip flexion seated is a 3/5 with soreness reported when lifting her leg. In standing pt is able to flex her L hip 50% more than seated.    Pt will benefit from skilled therapeutic intervention in order to improve on the following deficits Abnormal gait;Decreased activity tolerance;Decreased balance;Decreased mobility;Increased edema;Decreased strength;Postural dysfunction;Improper body mechanics;Pain;Difficulty walking;Decreased range of motion;Decreased endurance;Hypomobility   Rehab Potential Good   PT Frequency 2x / week   PT Duration 4 weeks   PT Treatment/Interventions ADLs/Self Care Home Management;Cryotherapy;Moist Heat;Therapeutic exercise;Balance training;Therapeutic activities;Manual techniques;Functional mobility training;Stair training;Gait training;Patient/family education;Neuromuscular re-education;Passive range of motion   PT Next Visit Plan Gait with increased speed/L hip flexon PROG NOTE TO MD    PT Home Exercise Plan continue exercises and walking.    Consulted and Agree with Plan of Care Patient        Problem List Patient Active Problem List   Diagnosis Date Noted  . LBP (low back pain) 03/30/2015  . Neuropathy 03/30/2015  . Chest pain, non-cardiac 03/30/2015  . Neoplasm of pituitary gland 03/30/2015  . Cancer of thyroid 03/30/2015  . Combined fat and carbohydrate induced hyperlipemia 10/17/2014   Pura Spice, PT, DPT # [phone]   06/26/2015, 1:49 PM  [facility] [address] [place], [name],  [address] Phone: [phone]   Fax:  [phone]

## 2015-06-30 ENCOUNTER — Ambulatory Visit: Payer: Medicare Other | Attending: Orthopedic Surgery | Admitting: Physical Therapy

## 2015-06-30 DIAGNOSIS — M6281 Muscle weakness (generalized): Secondary | ICD-10-CM | POA: Insufficient documentation

## 2015-06-30 DIAGNOSIS — M25552 Pain in left hip: Secondary | ICD-10-CM | POA: Insufficient documentation

## 2015-06-30 DIAGNOSIS — R262 Difficulty in walking, not elsewhere classified: Secondary | ICD-10-CM | POA: Diagnosis not present

## 2015-06-30 DIAGNOSIS — M25661 Stiffness of right knee, not elsewhere classified: Secondary | ICD-10-CM | POA: Diagnosis present

## 2015-06-30 NOTE — Therapy (Signed)
[place] [facility] [address] [place], [name], [address] Phone: [phone]   Fax:  [phone]  Physical Therapy Treatment  Patient Details  Name: Laura Walters MRN: [id number] Date of Birth: 31-Jan-1933 Referring Provider:  Dereck Leep, MD  Encounter Date: 06/30/2015      PT End of Session - 06/30/15 1304    Visit Number 7   Number of Visits 8   Date for PT Re-Evaluation 07/04/15   Authorization - Visit Number 7   Authorization - Number of Visits 10   PT Start Time 8016   PT Stop Time 1401   PT Time Calculation (min) 66 min   Activity Tolerance Patient tolerated treatment well;Patient limited by fatigue   Behavior During Therapy [facility] for tasks assessed/performed      Past Medical History  Diagnosis Date  . Arthritis   . Cardiomyopathy     idiopathic  . Hypertension   . Neuropathy   . Hypothyroidism   . Cancer of thyroid 03/30/2015  . DDD (degenerative disc disease), lumbar   . RAD (reactive airway disease)   . Chest pain     non cardiac    Past Surgical History  Procedure Laterality Date  . Pituitary surgery    . Thyroidectomy    . Hemorroidectomy      x 2  . Parotidectomy    . Dilation and curettage of uterus    . Left total hip arthroplasty    . Knee arthroscopy Right 05/19/2015    Procedure: Right knee arthrosocpy medail and lateral menisectomy, chondroplasty ;  Surgeon: Dereck Leep, MD;  Location: [facility] ORS;  Service: Orthopedics;  Laterality: Right;    There were no vitals filed for this visit.  Visit Diagnosis:  Difficulty walking  Muscle weakness  Joint stiffness of knee, right  Left hip pain      Subjective Assessment - 06/30/15 1303    Subjective Pt. states back has been hurting over the weekend.  Pt. reports 5/10 central, low back pain currently and no c/o hip or R knee pain.  Pt. state wearing back brace some yesterday has helped.     Limitations Sitting;Lifting;Walking;Standing   How long  can you stand comfortably? around 5+ minutes   How long can you walk comfortably? pt. able to walk 330 feet with SPC safely on a level surfaces   Patient Stated Goals increase strength with walking and daily tasks   Currently in Pain? Yes   Pain Score 5    Pain Location Back   Pain Orientation Lower   Pain Type Chronic pain       OBJECTIVE: Warm up: Nustep L7 for 10 mins steps (warm-up/no charge). There ex: Forward and sideways step ups over 6" step 15 x 2 each direction with B UE support (L hip fatigue noted). Standing hip flexion in //-bars with B UE support. Gait training: Ambulation with SPC and CGA over grass and sidewalks to ensure safety when ambulating outside in her yard. Verbal cues for L foot clearance, equal step length and L hip flexion with swing phase.  Manual tx: Supine hip/knee stretches (hamstring/piriformis focus)- 4x each as tolerated.  B knee to chest for back pain/ stenosis.    Pt response to Tx for medical necessity: Decreased endurance and increased complaints of back pain/soreness noted throughout session. Benefits from PT to work on L hip clearance and foot clearance due to inconsistency with step pattern during gait.  PT Long Term Goals - 07/28/2015 1350    PT LONG TERM GOAL #1   Title Pt. I with HEP to increase LE strength by 1/2 muscle grade in order to walk community distances.    Baseline B LE muscle strength grossly 4/5 MMT except.L hip flexion: 3+/5, R hamstring 4-/5.   Time 4   Period Weeks   Status Partially Met   PT LONG TERM GOAL #2   Title Pt. will increase LEFS and score >55 out of 80 to improve pain-free functional mobility.    Baseline 40/80 on 07/28/2015   Time 4   Period Weeks   Status Not Met   PT LONG TERM GOAL #3   Title Pt will complete a TUG in < 14 seconds with SPC to increase safety with community ambulation.    Time 4   Period Weeks   Status Achieved   PT LONG TERM GOAL #4   Title Pt. will ambulate community  distances with recip. gait pattern with use of SPC and proper posture to improve overall mobility.     Baseline Step through pattern with use of her UEs.    Time 4   Period Weeks   Status Partially Met             Plan - 07/01/15 0900    Clinical Impression Statement Pt. remains very lethargic t/o tx. session and limited by recent increase c/o low back pain with position changes/ prolonged standing/ walking.  No c/o R knee discomfort at this time but L knee pain reported with walking on uneven terrain and stair climbing with reciprocal pattern.  Decrease L hip flexion AROM/strength in sitting position.  Pt. encouraged to continue with daily walking/ HEP and discuss increase c/o fatigue with MD.      Pt will benefit from skilled therapeutic intervention in order to improve on the following deficits Abnormal gait;Decreased activity tolerance;Decreased balance;Decreased mobility;Increased edema;Decreased strength;Postural dysfunction;Improper body mechanics;Pain;Difficulty walking;Decreased range of motion;Decreased endurance;Hypomobility   Rehab Potential Good   PT Frequency 2x / week   PT Duration 4 weeks   PT Treatment/Interventions ADLs/Self Care Home Management;Cryotherapy;Moist Heat;Therapeutic exercise;Balance training;Therapeutic activities;Manual techniques;Functional mobility training;Stair training;Gait training;Patient/family education;Neuromuscular re-education;Passive range of motion   PT Next Visit Plan Discuss MD appt.    PT Home Exercise Plan continue exercises and walking.    Consulted and Agree with Plan of Care Patient          G-Codes - Jul 28, 2015 0929    Functional Assessment Tool Used LEFS/ gait pattern/ pain/ clinical judgement   Functional Limitation Mobility: Walking and moving around   Mobility: Walking and Moving Around Current Status [phone]) At least 20 percent but less than 40 percent impaired, limited or restricted   Mobility: Walking and Moving Around Goal  Status [phone]) At least 1 percent but less than 20 percent impaired, limited or restricted      Problem List Patient Active Problem List   Diagnosis Date Noted  . LBP (low back pain) 03/30/2015  . Neuropathy ([place]) 03/30/2015  . Chest pain, non-cardiac 03/30/2015  . Neoplasm of pituitary gland ([place]) 03/30/2015  . Cancer of thyroid ([place]) 03/30/2015  . Combined fat and carbohydrate induced hyperlipemia 10/17/2014   Pura Spice, PT, DPT # [phone]   07/01/2015, 9:36 AM  [place] [facility] [address] [place], [name], [address] Phone: [phone]   Fax:  [phone]

## 2015-07-03 ENCOUNTER — Ambulatory Visit: Payer: Medicare Other | Admitting: Physical Therapy

## 2015-07-03 DIAGNOSIS — R262 Difficulty in walking, not elsewhere classified: Secondary | ICD-10-CM | POA: Diagnosis not present

## 2015-07-03 DIAGNOSIS — M25661 Stiffness of right knee, not elsewhere classified: Secondary | ICD-10-CM

## 2015-07-03 DIAGNOSIS — M25552 Pain in left hip: Secondary | ICD-10-CM

## 2015-07-03 DIAGNOSIS — M6281 Muscle weakness (generalized): Secondary | ICD-10-CM

## 2015-07-04 ENCOUNTER — Encounter: Payer: Self-pay | Admitting: Physical Therapy

## 2015-07-04 NOTE — Therapy (Signed)
[facility] [address]. [place], [name], [address] Phone: [phone]   Fax:  [phone]  Physical Therapy Treatment  Patient Details  Name: Laura Walters MRN: [id number] Date of Birth: [birth year] Referring Provider:  Dereck Leep, MD  Encounter Date: 07/03/2015      PT End of Session - 07/04/15 0850    Visit Number 8   Number of Visits 12   Date for PT Re-Evaluation 07/31/15   Authorization - Visit Number 8   Authorization - Number of Visits 10   PT Start Time 0177   PT Stop Time 1354   PT Time Calculation (min) 59 min   Activity Tolerance Patient tolerated treatment well;Patient limited by fatigue   Behavior During Therapy [facility] for tasks assessed/performed      Past Medical History  Diagnosis Date  . Arthritis   . Cardiomyopathy ([place])     idiopathic  . Hypertension   . Neuropathy ([place])   . Hypothyroidism   . Cancer of thyroid ([place]) 03/30/2015  . DDD (degenerative disc disease), lumbar   . RAD (reactive airway disease)   . Chest pain     non cardiac    Past Surgical History  Procedure Laterality Date  . Pituitary surgery    . Thyroidectomy    . Hemorroidectomy      x 2  . Parotidectomy    . Dilation and curettage of uterus    . Left total hip arthroplasty    . Knee arthroscopy Right 05/19/2015    Procedure: Right knee arthrosocpy medail and lateral menisectomy, chondroplasty ;  Surgeon: Dereck Leep, MD;  Location: [facility] ORS;  Service: Orthopedics;  Laterality: Right;    There were no vitals filed for this visit.  Visit Diagnosis:  Difficulty walking  Muscle weakness  Joint stiffness of knee, right  Left hip pain      Subjective Assessment - 07/04/15 0848    Subjective Pt states back pain continues to bother her and currently is a 4/10. Pt states no L LE or R knee pain at this time. Pt reports fatigue. Pt reports MD f/u went well and has been discharged with regards to R knee. Pt reports injection  for low back scheduled for 11/4.   Limitations Sitting;Lifting;Walking;Standing   How long can you stand comfortably? around 5+ minutes   How long can you walk comfortably? pt. able to walk 330 feet with SPC safely on a level surfaces   Patient Stated Goals increase strength with walking and daily tasks   Currently in Pain? Yes   Pain Score 4    Pain Location Back   Pain Orientation Lower   Pain Descriptors / Indicators Discomfort   Pain Type Chronic pain   Pain Onset More than a month ago   Pain Frequency Intermittent           OBJECTIVE: Warm up: Nustep L7 for 10 mins steps (warm-up/no charge). There ex: supine bridging x 10 (increased fatigue noted). Gait training: Ambulation with SPC and close supervision over grass and sidewalks to ensure safety when ambulating outside in her yard. Verbal cues and min assist needed to step up a curb into the grass. Pt is still safer to ambulate outside with RW. [place] re-ed: Balance on blue airex with various base of support and CGA. Attempted tandem but unable, good semi-tandem stance with R LE behind, increased L hip soreness with L LE behind. Normal base of support and semi-tandem  poor compensatory responses; pt just leaned backwards x 5 instead of stepping forward. With normal base of support, reaching across body to hit a target.   Pt response to Tx for medical necessity: Decreased endurance and increased complaints of back pain/soreness noted throughout session. Benefits from PT to work on L hip clearance and foot clearance due to inconsistency with step pattern during gait.             PT Long Term Goals - 06/30/15 1350    PT LONG TERM GOAL #1   Title Pt. I with HEP to increase LE strength by 1/2 muscle grade in order to walk community distances.    Baseline B LE muscle strength grossly 4/5 MMT except.L hip flexion: 3+/5, R hamstring 4-/5.   Time 4   Period Weeks   Status Partially Met   PT LONG TERM GOAL #2   Title Pt.  will increase LEFS and score >55 out of 80 to improve pain-free functional mobility.    Baseline 40/80 on 06/30/15   Time 4   Period Weeks   Status Not Met   PT LONG TERM GOAL #3   Title Pt will complete a TUG in < 14 seconds with SPC to increase safety with community ambulation.    Time 4   Period Weeks   Status Achieved   PT LONG TERM GOAL #4   Title Pt. will ambulate community distances with recip. gait pattern with use of SPC and proper posture to improve overall mobility.     Baseline Step through pattern with use of her UEs.    Time 4   Period Weeks   Status Partially Met               Plan - 07/04/15 0850    Clinical Impression Statement Pt has decreased activity tolerance secondary to LBP. Pt has difficulty with transition. Pt ambulates with SPC and decreased L hip flexion. Pt is able to ambulate over uneven terrain with [facility] and close supervision but is unable to safely step up on a curb without min assistance and verbal cues. Pt first couple of steps after sitting are made holding on to wall or furnitiure. Pt edcuated on importance of using RW if furniture walking continues. Pt demonstrates decreased endurance with exercises and requires a seated break after 15 reps.  When working on balance, pt demonstrates poor compensatory responses as noted by pt's response to just fall backwards instead of stepping forward .   Pt will benefit from skilled therapeutic intervention in order to improve on the following deficits Abnormal gait;Decreased activity tolerance;Decreased balance;Decreased mobility;Increased edema;Decreased strength;Postural dysfunction;Improper body mechanics;Pain;Difficulty walking;Decreased range of motion;Decreased endurance;Hypomobility   Rehab Potential Good   PT Frequency 2x / week   PT Duration 4 weeks   PT Treatment/Interventions ADLs/Self Care Home Management;Cryotherapy;Moist Heat;Therapeutic exercise;Balance training;Therapeutic activities;Manual  techniques;Functional mobility training;Stair training;Gait training;Patient/family education;Neuromuscular re-education;Passive range of motion   PT Next Visit Plan balance responses/functional activities/safe gait   PT Home Exercise Plan continue exercises and walking.    Consulted and Agree with Plan of Care Patient        Problem List Patient Active Problem List   Diagnosis Date Noted  . LBP (low back pain) 03/30/2015  . Neuropathy ([place]) 03/30/2015  . Chest pain, non-cardiac 03/30/2015  . Neoplasm of pituitary gland ([place]) 03/30/2015  . Cancer of thyroid ([place]) 03/30/2015  . Combined fat and carbohydrate induced hyperlipemia 10/17/2014   Pura Spice, PT, DPT # [id number]  07/04/2015, 12:54 PM  [facility] [address] [place], [name], [address] Phone: [phone]   Fax:  [phone]

## 2015-07-07 ENCOUNTER — Encounter: Payer: Medicare Other | Admitting: Physical Therapy

## 2015-07-08 ENCOUNTER — Telehealth: Payer: Self-pay | Admitting: *Deleted

## 2015-07-08 ENCOUNTER — Other Ambulatory Visit: Payer: Self-pay | Admitting: Family Medicine

## 2015-07-08 DIAGNOSIS — C73 Malignant neoplasm of thyroid gland: Secondary | ICD-10-CM

## 2015-07-08 NOTE — Telephone Encounter (Signed)
Pt will come in tomorrow to [facility] for lab draw

## 2015-07-08 NOTE — Telephone Encounter (Signed)
States when she went to PT Thursday Mike her therapist told her that her thyroid labs were off and she is requesting that they be rechecked. Her last T4, TSH, Thyroglobulin was checked 03/27/15 T4  Status: Finalresult Visible to patient:  Not Released Nextappt: 07/10/2015 at 01:00 PM in Rehabilitation Clementeen Hoof, Rebeca Alert, PT) Dx:  Thyroid cancer ([place])         Ref Range 51mo ago    T4, Total 4.5 - 12.0 ug/dL 7.5   Comments: (NOTE)  Performed At: [facility]  [place], [name] [id number]  Lindon Romp MD [id number]     Resulting Agency [facility]       Specimen Collected: 03/27/15 10:59 AM Last Resulted: 03/28/15 4:37 AM                    TSH  Status: Finalresult Visible to patient:  Not Released Nextappt: 07/10/2015 at 01:00 PM in Rehabilitation Clementeen Hoof, Rebeca Alert, PT) Dx:  Thyroid cancer ([place])            Ref Range 9mo ago    TSH 0.350 - 4.500 uIU/mL 0.239 (L)   Resulting Agency [facility]       Specimen Collected: 03/27/15 10:59 AM Last Resulted: 03/27/15 12:29 PM                   [facility] test name THYROGLOBULIN   Misc [facility] result COMMENT   Comments: (NOTE)  Test Ordered: [id number] Thyroglobulin Antibody  Thyroglobulin Antibody     <1.0       IU/mL  BN    Reference Range: 0.0-0.9                 Thyroglobulin Antibody measured by [facility] Methodology  Performed At: [facility]  [place], [name] [id number]  Lindon Romp MD [id number]

## 2015-07-09 ENCOUNTER — Inpatient Hospital Stay: Payer: Medicare Other | Attending: Oncology

## 2015-07-09 DIAGNOSIS — C73 Malignant neoplasm of thyroid gland: Secondary | ICD-10-CM

## 2015-07-09 DIAGNOSIS — Z85850 Personal history of malignant neoplasm of thyroid: Secondary | ICD-10-CM | POA: Diagnosis not present

## 2015-07-09 LAB — CBC WITH DIFFERENTIAL/PLATELET
Basophils Absolute: 0 10*3/uL (ref 0–0.1)
Basophils Relative: 0 %
EOS ABS: 0.7 10*3/uL (ref 0–0.7)
EOS PCT: 11 %
HCT: 35.9 % (ref 35.0–47.0)
Hemoglobin: 12 g/dL (ref 12.0–16.0)
LYMPHS ABS: 1.2 10*3/uL (ref 1.0–3.6)
LYMPHS PCT: 17 %
MCH: 27.7 pg (ref 26.0–34.0)
MCHC: 33.3 g/dL (ref 32.0–36.0)
MCV: 83.2 fL (ref 80.0–100.0)
MONO ABS: 1 10*3/uL — AB (ref 0.2–0.9)
MONOS PCT: 15 %
Neutro Abs: 4 10*3/uL (ref 1.4–6.5)
Neutrophils Relative %: 57 %
PLATELETS: 306 10*3/uL (ref 150–440)
RBC: 4.32 MIL/uL (ref 3.80–5.20)
RDW: 15.2 % — ABNORMAL HIGH (ref 11.5–14.5)
WBC: 7 10*3/uL (ref 3.6–11.0)

## 2015-07-09 LAB — COMPREHENSIVE METABOLIC PANEL
ALK PHOS: 161 U/L — AB (ref 38–126)
ALT: 15 U/L (ref 14–54)
ANION GAP: 12 (ref 5–15)
AST: 28 U/L (ref 15–41)
Albumin: 3.4 g/dL — ABNORMAL LOW (ref 3.5–5.0)
BUN: 16 mg/dL (ref 6–20)
CALCIUM: 8.2 mg/dL — AB (ref 8.9–10.3)
CHLORIDE: 96 mmol/L — AB (ref 101–111)
CO2: 27 mmol/L (ref 22–32)
CREATININE: 1.06 mg/dL — AB (ref 0.44–1.00)
GFR, EST AFRICAN AMERICAN: 56 mL/min — AB (ref >60)
GFR, EST NON AFRICAN AMERICAN: 48 mL/min — AB (ref >60)
Glucose, Bld: 115 mg/dL — ABNORMAL HIGH (ref 65–99)
Potassium: 3.6 mmol/L (ref 3.5–5.1)
SODIUM: 135 mmol/L (ref 135–145)
Total Bilirubin: 0.5 mg/dL (ref 0.3–1.2)
Total Protein: 7.3 g/dL (ref 6.5–8.1)

## 2015-07-09 LAB — TSH: TSH: 0.216 u[IU]/mL — AB (ref 0.350–4.500)

## 2015-07-10 ENCOUNTER — Ambulatory Visit: Payer: Medicare Other | Admitting: Physical Therapy

## 2015-07-10 ENCOUNTER — Encounter: Payer: Self-pay | Admitting: Physical Therapy

## 2015-07-10 DIAGNOSIS — M6281 Muscle weakness (generalized): Secondary | ICD-10-CM

## 2015-07-10 DIAGNOSIS — R262 Difficulty in walking, not elsewhere classified: Secondary | ICD-10-CM

## 2015-07-10 DIAGNOSIS — M25661 Stiffness of right knee, not elsewhere classified: Secondary | ICD-10-CM

## 2015-07-10 DIAGNOSIS — M25552 Pain in left hip: Secondary | ICD-10-CM

## 2015-07-10 NOTE — Therapy (Signed)
[facility] [address] [place], [name], [address] Phone: [phone]   Fax:  [phone]  Physical Therapy Treatment  Patient Details  Name: Laura Walters MRN: [id number] Date of Birth: January 01, 1933 Referring Provider:  Dereck Leep, MD  Encounter Date: 07/10/2015      PT End of Session - 07/10/15 1320    Visit Number 9   Number of Visits 12   Date for PT Re-Evaluation 07/31/15   Authorization - Visit Number 9   Authorization - Number of Visits 10   PT Start Time 5681   PT Stop Time 1400   PT Time Calculation (min) 62 min   Equipment Utilized During Treatment Gait belt   Activity Tolerance Patient tolerated treatment well;Patient limited by fatigue   Behavior During Therapy [facility] for tasks assessed/performed      Past Medical History  Diagnosis Date  . Arthritis   . Cardiomyopathy ([place])     idiopathic  . Hypertension   . Neuropathy ([place])   . Hypothyroidism   . Cancer of thyroid ([place]) 03/30/2015  . DDD (degenerative disc disease), lumbar   . RAD (reactive airway disease)   . Chest pain     non cardiac    Past Surgical History  Procedure Laterality Date  . Pituitary surgery    . Thyroidectomy    . Hemorroidectomy      x 2  . Parotidectomy    . Dilation and curettage of uterus    . Left total hip arthroplasty    . Knee arthroscopy Right 05/19/2015    Procedure: Right knee arthrosocpy medail and lateral menisectomy, chondroplasty ;  Surgeon: Dereck Leep, MD;  Location: [facility] ORS;  Service: Orthopedics;  Laterality: Right;    There were no vitals filed for this visit.  Visit Diagnosis:  Difficulty walking  Muscle weakness  Joint stiffness of knee, right  Left hip pain      Subjective Assessment - 07/10/15 1317    Subjective Pt. reports persistent 5/10 B LE pain.  Pt. states back is still sore, esp. in the morning when she gets out of bed.  No falls reported.     Limitations  Sitting;Lifting;Walking;Standing   How long can you stand comfortably? around 5+ minutes   How long can you walk comfortably? pt. able to walk 330 feet with SPC safely on a level surfaces   Patient Stated Goals increase strength with walking and daily tasks   Currently in Pain? Yes   Pain Score 5    Pain Location Back       OBJECTIVE: Warm up: Nustep L7 for 10 mins steps (warm-up/no charge). There ex: Standing hip flexion/ abd. 20x.  Reviewed HEP/ supine ex. Program.   Gait training: Ambulation with SPC and close supervision over grass and sidewalks to ensure safety when ambulating outside in her yard. Verbal cues and min assist needed to step up a curb into the grass. Walking in hallway with and without use of SPC and working on posture/ consistent step pattern.  Neuro re-ed:  3"/6" step ups and overs in //-bars with min. To no UE assist working on step pattern/ maintaining consistent BOS/ upright posture (mirror feedback/ cuing required).     Pt response to Tx for medical necessity: Decreased endurance and increased complaints of back pain/soreness noted throughout session. Benefits from PT to work on L hip clearance and foot clearance due to inconsistency with step pattern during gait.  PT Long Term Goals - 06/30/15 1350    PT LONG TERM GOAL #1   Title Pt. I with HEP to increase LE strength by 1/2 muscle grade in order to walk community distances.    Baseline B LE muscle strength grossly 4/5 MMT except.L hip flexion: 3+/5, R hamstring 4-/5.   Time 4   Period Weeks   Status Partially Met   PT LONG TERM GOAL #2   Title Pt. will increase LEFS and score >55 out of 80 to improve pain-free functional mobility.    Baseline 40/80 on 06/30/15   Time 4   Period Weeks   Status Not Met   PT LONG TERM GOAL #3   Title Pt will complete a TUG in < 14 seconds with SPC to increase safety with community ambulation.    Time 4   Period Weeks   Status Achieved   PT LONG TERM GOAL #4    Title Pt. will ambulate community distances with recip. gait pattern with use of SPC and proper posture to improve overall mobility.     Baseline Step through pattern with use of her UEs.    Time 4   Period Weeks   Status Partially Met       Problem List Patient Active Problem List   Diagnosis Date Noted  . LBP (low back pain) 03/30/2015  . Neuropathy ([place]) 03/30/2015  . Chest pain, non-cardiac 03/30/2015  . Neoplasm of pituitary gland ([place]) 03/30/2015  . Cancer of thyroid ([place]) 03/30/2015  . Combined fat and carbohydrate induced hyperlipemia 10/17/2014   Pura Spice, PT, DPT # [id number]   07/11/2015, 5:18 PM   [facility] [address] [place], [name], [address] Phone: [phone]   Fax:  [phone]

## 2015-07-15 LAB — T4: T4 TOTAL: 7.5 ug/dL (ref 4.5–12.0)

## 2015-07-15 LAB — THYROGLOBULIN LEVEL

## 2015-07-15 LAB — THYROGLOBULIN ANTIBODY: Thyroglobulin Antibody: <1 IU/mL (ref 0.0–0.9)

## 2015-07-17 ENCOUNTER — Ambulatory Visit: Payer: Medicare Other | Admitting: Physical Therapy

## 2015-07-17 DIAGNOSIS — R262 Difficulty in walking, not elsewhere classified: Secondary | ICD-10-CM | POA: Diagnosis not present

## 2015-07-17 DIAGNOSIS — M25661 Stiffness of right knee, not elsewhere classified: Secondary | ICD-10-CM

## 2015-07-17 DIAGNOSIS — M25552 Pain in left hip: Secondary | ICD-10-CM

## 2015-07-17 DIAGNOSIS — M6281 Muscle weakness (generalized): Secondary | ICD-10-CM

## 2015-07-18 NOTE — Therapy (Signed)
[place] [facility] [address] [place], [name], [address] Phone: [phone]   Fax:  [phone]  Physical Therapy Treatment  Patient Details  Name: JACIE TRISTAN MRN: [id number] Date of Birth: [birth year] No Data Recorded  Encounter Date: 07/17/2015      PT End of Session - 07/24/15 1628    Visit Number 10   Number of Visits 12   Date for PT Re-Evaluation 07/31/15   Authorization - Visit Number 10   Authorization - Number of Visits 20   PT Start Time 1300   PT Stop Time 1517   PT Time Calculation (min) 55 min   Equipment Utilized During Treatment Gait belt   Activity Tolerance Patient tolerated treatment well;Patient limited by fatigue   Behavior During Therapy [facility] for tasks assessed/performed      Past Medical History  Diagnosis Date  . Arthritis   . Cardiomyopathy ([place])     idiopathic  . Hypertension   . Neuropathy ([place])   . Hypothyroidism   . Cancer of thyroid ([place]) 03/30/2015  . DDD (degenerative disc disease), lumbar   . RAD (reactive airway disease)   . Chest pain     non cardiac    Past Surgical History  Procedure Laterality Date  . Pituitary surgery    . Thyroidectomy    . Hemorroidectomy      x 2  . Parotidectomy    . Dilation and curettage of uterus    . Left total hip arthroplasty    . Knee arthroscopy Right 05/19/2015    Procedure: Right knee arthrosocpy medail and lateral menisectomy, chondroplasty ;  Surgeon: Dereck Leep, MD;  Location: [facility] ORS;  Service: Orthopedics;  Laterality: Right;    There were no vitals filed for this visit.  Visit Diagnosis:  Difficulty walking  Muscle weakness  Joint stiffness of knee, right  Left hip pain                                    PT Long Term Goals - 07-24-2015 1646    PT LONG TERM GOAL #1   Title Pt. I with HEP to increase LE strength by 1/2 muscle grade in order to walk community distances.    Time 4   Period Weeks   Status Partially Met   PT LONG TERM GOAL #2   Title Pt. will increase LEFS and score >55 out of 80 to improve pain-free functional mobility.    Baseline 40/80 on 06/30/15   Time 4   Period Weeks   Status Not Met   PT LONG TERM GOAL #3   Title Pt will complete a TUG in < 14 seconds with SPC to increase safety with community ambulation.    Baseline SPC (hurri-cane)   Time 4   Period Weeks   Status Achieved   PT LONG TERM GOAL #4   Title Pt. will ambulate community distances with recip. gait pattern with use of SPC and proper posture to improve overall mobility.     Baseline Step through pattern with use of her UEs.    Time 4   Period Weeks   Status Partially Met            G-Codes - 2015-07-24 1647    Functional Assessment Tool Used LEFS/ gait pattern/ pain/ clinical judgement   Functional Limitation Mobility: Walking and moving around   Mobility:  Walking and Moving Around Current Status [phone]) At least 20 percent but less than 40 percent impaired, limited or restricted   Mobility: Walking and Moving Around Goal Status [phone]) At least 1 percent but less than 20 percent impaired, limited or restricted      Problem List Patient Active Problem List   Diagnosis Date Noted  . LBP (low back pain) 03/30/2015  . Neuropathy ([place]) 03/30/2015  . Chest pain, non-cardiac 03/30/2015  . Neoplasm of pituitary gland ([place]) 03/30/2015  . Cancer of thyroid ([place]) 03/30/2015  . Combined fat and carbohydrate induced hyperlipemia 10/17/2014   Pura Spice, PT, DPT # [id number]   07/18/2015, 4:52 PM  [place] [facility] [address] [place], [name], [address] Phone: [phone]   Fax:  [phone]  Name: TACHINA SPOONEMORE MRN: [id number] Date of Birth: [birth year]

## 2015-07-22 NOTE — Therapy (Signed)
[place] [facility] [address] [place], [name], [address] Phone: [phone]   Fax:  [phone]  Physical Therapy Treatment  Patient Details  Name: Laura Walters MRN: [id number] Date of Birth: Jun 11, 1933 No Data Recorded  Encounter Date: 07/17/2015    Past Medical History  Diagnosis Date  . Arthritis   . Cardiomyopathy ([place])     idiopathic  . Hypertension   . Neuropathy ([place])   . Hypothyroidism   . Cancer of thyroid ([place]) 03/30/2015  . DDD (degenerative disc disease), lumbar   . RAD (reactive airway disease)   . Chest pain     non cardiac    Past Surgical History  Procedure Laterality Date  . Pituitary surgery    . Thyroidectomy    . Hemorroidectomy      x 2  . Parotidectomy    . Dilation and curettage of uterus    . Left total hip arthroplasty    . Knee arthroscopy Right 05/19/2015    Procedure: Right knee arthrosocpy medail and lateral menisectomy, chondroplasty ;  Surgeon: Dereck Leep, MD;  Location: [facility] ORS;  Service: Orthopedics;  Laterality: Right;    There were no vitals filed for this visit.  Visit Diagnosis:  Difficulty walking  Muscle weakness  Joint stiffness of knee, right  Left hip pain      OBJECTIVE: Warm up: Nustep L7 for 10 mins steps (warm-up/no charge). There ex: Standing hip flexion/ abd./ ext./ heel raises 20x (seated rest break).  Gait training: Ambulation with SPC and close supervision over grass and sidewalks to ensure safety when ambulating outside in her yard. Verbal cues and min assist needed to step up a curb into the grass. Walking in hallway with and without use of SPC and working on posture/ consistent step pattern (2 LOB but able to self-correct). Neuro re-ed: Airex step ups and overs in //-bars with min. To no UE assist working on step pattern/ maintaining consistent BOS/ upright posture (mirror feedback/ cuing required). Standing functional reach/ EC static stands with SBA/CGA  for safety.    Pt response to Tx for medical necessity: Decreased endurance and increased complaints of back pain/soreness noted throughout session. Benefits from PT to work on L hip clearance and foot clearance due to inconsistency with step pattern during gait.          PT Long Term Goals - 07/18/15 1646    PT LONG TERM GOAL #1   Title Pt. I with HEP to increase LE strength by 1/2 muscle grade in order to walk community distances.    Time 4   Period Weeks   Status Partially Met   PT LONG TERM GOAL #2   Title Pt. will increase LEFS and score >55 out of 80 to improve pain-free functional mobility.    Baseline 40/80 on 06/30/15   Time 4   Period Weeks   Status Not Met   PT LONG TERM GOAL #3   Title Pt will complete a TUG in < 14 seconds with SPC to increase safety with community ambulation.    Baseline SPC (hurri-cane)   Time 4   Period Weeks   Status Achieved   PT LONG TERM GOAL #4   Title Pt. will ambulate community distances with recip. gait pattern with use of SPC and proper posture to improve overall mobility.     Baseline Step through pattern with use of her UEs.    Time 4   Period Weeks  Status Partially Met            Plan - 07/22/15 0909    Clinical Impression Statement Generalized muscle fatigue limits overall progression with core/ LE strengthening ex. and independence with walking without use of SPC.  Pt. has occasional mis-step while walking in clinic but able to self-correct to regain balance, which is why use of SPC really helps.  No recent falls reported and no incresae c/o L hip pain but persistent discomfort reported.  Moderate cuing to correct forward head/ rounded shoulder posture.     Pt will benefit from skilled therapeutic intervention in order to improve on the following deficits Abnormal gait;Decreased activity tolerance;Decreased balance;Decreased mobility;Increased edema;Decreased strength;Postural dysfunction;Improper body  mechanics;Pain;Difficulty walking;Decreased range of motion;Decreased endurance;Hypomobility   Rehab Potential Good   PT Frequency 2x / week   PT Duration 4 weeks   PT Treatment/Interventions ADLs/Self Care Home Management;Cryotherapy;Moist Heat;Therapeutic exercise;Balance training;Therapeutic activities;Manual techniques;Functional mobility training;Stair training;Gait training;Patient/family education;Neuromuscular re-education;Passive range of motion   PT Next Visit Plan balance responses/functional activities/safe gait   PT Home Exercise Plan continue exercises and walking.    Consulted and Agree with Plan of Care Patient        Problem List Patient Active Problem List   Diagnosis Date Noted  . LBP (low back pain) 03/30/2015  . Neuropathy ([place]) 03/30/2015  . Chest pain, non-cardiac 03/30/2015  . Neoplasm of pituitary gland ([place]) 03/30/2015  . Cancer of thyroid ([place]) 03/30/2015  . Combined fat and carbohydrate induced hyperlipemia 10/17/2014   Pura Spice, PT, DPT # [id number]   07/22/2015, 9:14 AM  [facility] [address] [place], [name], [address] Phone: [phone]   Fax:  [phone]  Name: Laura Walters MRN: [id number] Date of Birth: 04/04/1933

## 2015-07-24 ENCOUNTER — Ambulatory Visit: Payer: Medicare Other | Admitting: Physical Therapy

## 2015-07-24 ENCOUNTER — Encounter: Payer: Self-pay | Admitting: Physical Therapy

## 2015-07-24 DIAGNOSIS — R262 Difficulty in walking, not elsewhere classified: Secondary | ICD-10-CM | POA: Diagnosis not present

## 2015-07-24 DIAGNOSIS — M6281 Muscle weakness (generalized): Secondary | ICD-10-CM

## 2015-07-24 DIAGNOSIS — M25661 Stiffness of right knee, not elsewhere classified: Secondary | ICD-10-CM

## 2015-07-24 DIAGNOSIS — M25552 Pain in left hip: Secondary | ICD-10-CM

## 2015-07-24 NOTE — Therapy (Signed)
[facility] [address] [place], [name], [address] Phone: [phone]   Fax:  [phone]  Physical Therapy Treatment  Patient Details  Name: Laura Walters MRN: [id number] Date of Birth: 12/22/1932 No Data Recorded  Encounter Date: 07/24/2015      PT End of Session - 07/24/15 2208    Visit Number 11   Number of Visits 12   Date for PT Re-Evaluation 07/31/15   Authorization - Visit Number 11   Authorization - Number of Visits 20   PT Start Time 2800   PT Stop Time 1355   PT Time Calculation (min) 59 min   Equipment Utilized During Treatment Gait belt   Activity Tolerance Patient tolerated treatment well;Patient limited by fatigue;Patient limited by pain   Behavior During Therapy [facility] for tasks assessed/performed      Past Medical History  Diagnosis Date  . Arthritis   . Cardiomyopathy ([place])     idiopathic  . Hypertension   . Neuropathy ([place])   . Hypothyroidism   . Cancer of thyroid ([place]) 03/30/2015  . DDD (degenerative disc disease), lumbar   . RAD (reactive airway disease)   . Chest pain     non cardiac    Past Surgical History  Procedure Laterality Date  . Pituitary surgery    . Thyroidectomy    . Hemorroidectomy      x 2  . Parotidectomy    . Dilation and curettage of uterus    . Left total hip arthroplasty    . Knee arthroscopy Right 05/19/2015    Procedure: Right knee arthrosocpy medail and lateral menisectomy, chondroplasty ;  Surgeon: Dereck Leep, MD;  Location: [facility] ORS;  Service: Orthopedics;  Laterality: Right;    There were no vitals filed for this visit.  Visit Diagnosis:  Difficulty walking  Muscle weakness  Joint stiffness of knee, right  Left hip pain      Subjective Assessment - 07/24/15 2206    Subjective Pt reports fatigue and overall not doing much. Pt states her low back pain is an 8/10 and her R leg pain is a 10/10. Pt is having an injection on 11/4 to her low back and another  one three weeks later to help with pain.   Limitations Sitting;Lifting;Walking;Standing   How long can you stand comfortably? around 5+ minutes   How long can you walk comfortably? pt. able to walk 330 feet with SPC safely on a level surfaces   Patient Stated Goals increase strength with walking and daily tasks   Currently in Pain? Yes   Pain Score 8    Pain Location Back   Pain Descriptors / Indicators Aching   Pain Type Chronic pain   Pain Onset More than a month ago   Pain Frequency Constant   Multiple Pain Sites Yes   Pain Score 10   Pain Location Leg   Pain Orientation Right   Pain Descriptors / Indicators Aching   Pain Type Chronic pain   Pain Onset More than a month ago   Pain Frequency Intermittent         OBJECTIVE: There ex: NuStep L7 10 mins (warm up, no charge). Step overs a 6" step with B UE support to improve L hip flexion, R knee flexion. Reviewed HEP at length and pt encouraged to continue doing HEP. Gait training: Ambulation with SPC over grass, pavement and gym. Pt ambulates with decreased cadence on the grass and decreased stance  time on R leg with all parts of gait. Pt is unable to step up over a curb without bilateral UE support and minimal assistance from PT. Manual: B LE stretching to hamstrings/piriformis/low back. R knee patellar mobilizations grade II, all 4 directions 3 x 30 seconds. Pt reports increased R knee symptoms on the lateral joint line and anteriorly where patellar tendon inserts.   Pt response to Tx for medical necessity: Pt demonstrates decreased endurance and increased pain which limits her gains in PT. Pt is receiving 2 injections over the next 3 weeks and PT to reassess at that time. Pt currently discharged due to a plateau in status.        PT Long Term Goals - 08/20/15 2210    PT LONG TERM GOAL #1   Title Pt. I with HEP to increase LE strength by 1/2 muscle grade in order to walk community distances.    Time 4   Period Weeks   Status  Partially Met   PT LONG TERM GOAL #2   Title Pt. will increase LEFS and score >55 out of 80 to improve pain-free functional mobility.    Baseline 40/80 on 06/30/15   Time 4   Period Weeks   Status Not Met   PT LONG TERM GOAL #3   Title Pt will complete a TUG in < 14 seconds with SPC to increase safety with community ambulation.    Baseline SPC (hurri-cane)   Time 4   Period Weeks   Status Achieved   PT LONG TERM GOAL #4   Title Pt. will ambulate community distances with recip. gait pattern with use of SPC and proper posture to improve overall mobility.     Baseline Step through pattern with use of her UEs.    Time 4   Period Weeks   Status Partially Met               Plan - Aug 20, 2015 2208    Clinical Impression Statement Pt is limited in activity tolerance due to low back pain and R leg pain. Pt has increased R knee edema noted today as compared to last week. Pt is able to ambulate with the use of a SPC but with a decreased cadence. Pt ambulates with decreased gait speed and decreased R LE stance time. Pt is able to perform a step up with bilateral UE support.    Pt will benefit from skilled therapeutic intervention in order to improve on the following deficits Abnormal gait;Decreased activity tolerance;Decreased balance;Decreased mobility;Increased edema;Decreased strength;Postural dysfunction;Improper body mechanics;Pain;Difficulty walking;Decreased range of motion;Decreased endurance;Hypomobility   Rehab Potential Good   PT Frequency 2x / week   PT Duration 4 weeks   PT Treatment/Interventions ADLs/Self Care Home Management;Cryotherapy;Moist Heat;Therapeutic exercise;Balance training;Therapeutic activities;Manual techniques;Functional mobility training;Stair training;Gait training;Patient/family education;Neuromuscular re-education;Passive range of motion   PT Home Exercise Plan continue exercises and walking.    Consulted and Agree with Plan of Care Patient           G-Codes - 08-20-15 1612    Functional Assessment Tool Used LEFS/ gait pattern/ pain/ clinical judgement   Functional Limitation Mobility: Walking and moving around   Mobility: Walking and Moving Around Current Status [phone]) At least 20 percent but less than 40 percent impaired, limited or restricted   Mobility: Walking and Moving Around Goal Status ([id number]) At least 20 percent but less than 40 percent impaired, limited or restricted   Mobility: Walking and Moving Around Discharge Status [phone]) At least 20 percent  but less than 40 percent impaired, limited or restricted      Problem List Patient Active Problem List   Diagnosis Date Noted  . LBP (low back pain) 03/30/2015  . Neuropathy ([place]) 03/30/2015  . Chest pain, non-cardiac 03/30/2015  . Neoplasm of pituitary gland ([place]) 03/30/2015  . Cancer of thyroid ([place]) 03/30/2015  . Combined fat and carbohydrate induced hyperlipemia 10/17/2014   Pura Spice, PT, DPT # [phone]   07/25/2015, 4:15 PM   [facility] [address] [place], [name], [address] Phone: [phone]   Fax:  [phone]  Name: Laura Walters MRN: [id number] Date of Birth: 05-Jul-1933

## 2015-07-29 ENCOUNTER — Other Ambulatory Visit: Payer: Self-pay | Admitting: Internal Medicine

## 2015-07-29 DIAGNOSIS — Z1231 Encounter for screening mammogram for malignant neoplasm of breast: Secondary | ICD-10-CM

## 2015-07-31 ENCOUNTER — Encounter: Payer: Medicare Other | Admitting: Physical Therapy

## 2015-08-01 DIAGNOSIS — M5136 Other intervertebral disc degeneration, lumbar region: Secondary | ICD-10-CM | POA: Insufficient documentation

## 2015-08-01 DIAGNOSIS — M5116 Intervertebral disc disorders with radiculopathy, lumbar region: Secondary | ICD-10-CM | POA: Insufficient documentation

## 2015-08-01 DIAGNOSIS — M48061 Spinal stenosis, lumbar region without neurogenic claudication: Secondary | ICD-10-CM | POA: Insufficient documentation

## 2015-08-06 ENCOUNTER — Ambulatory Visit: Payer: Medicare Other

## 2015-08-13 ENCOUNTER — Ambulatory Visit
Admission: RE | Admit: 2015-08-13 | Discharge: 2015-08-13 | Disposition: A | Discharge status: Home / Self Care | Payer: Medicare Other | Source: Ambulatory Visit | Attending: Internal Medicine | Admitting: Internal Medicine

## 2015-08-13 DIAGNOSIS — Z1231 Encounter for screening mammogram for malignant neoplasm of breast: Secondary | ICD-10-CM | POA: Diagnosis present

## 2015-09-29 ENCOUNTER — Other Ambulatory Visit: Payer: Medicare Other

## 2015-09-29 ENCOUNTER — Ambulatory Visit: Payer: Medicare Other | Admitting: Oncology

## 2015-10-01 ENCOUNTER — Ambulatory Visit: Payer: Self-pay | Admitting: Obstetrics and Gynecology

## 2015-10-02 ENCOUNTER — Encounter: Payer: Self-pay | Admitting: Obstetrics and Gynecology

## 2015-10-02 ENCOUNTER — Ambulatory Visit (INDEPENDENT_AMBULATORY_CARE_PROVIDER_SITE_OTHER): Payer: Medicare Other | Admitting: Obstetrics and Gynecology

## 2015-10-02 VITALS — BP 128/70 | HR 76 | Ht 60.0 in | Wt 130.3 lb

## 2015-10-02 DIAGNOSIS — R350 Frequency of micturition: Secondary | ICD-10-CM

## 2015-10-02 LAB — POCT URINALYSIS DIPSTICK
Bilirubin, UA: NEGATIVE
Blood, UA: NEGATIVE
Glucose, UA: NEGATIVE
Ketones, UA: NEGATIVE
NITRITE UA: NEGATIVE
PH UA: 7.5
PROTEIN UA: NEGATIVE
Spec Grav, UA: 1.01
Urobilinogen, UA: 0.2

## 2015-10-04 LAB — URINE CULTURE

## 2015-10-07 ENCOUNTER — Telehealth: Payer: Self-pay

## 2015-10-07 MED ORDER — CEPHALEXIN 500 MG PO CAPS: 500.0000 mg | ORAL_CAPSULE | Freq: Two times a day (BID) | ORAL | Status: DC

## 2015-10-07 NOTE — Telephone Encounter (Signed)
-----   Message from Brayton Mars, MD sent at 10/06/2015  8:46 PM EST ----- Please notify - Abnormal Labs Recommend keflex 500mg  2x/day x 7 days; #14; NORF

## 2015-10-07 NOTE — Telephone Encounter (Signed)
Pt aware. Med erx. 

## 2015-10-13 DIAGNOSIS — C73 Malignant neoplasm of thyroid gland: Secondary | ICD-10-CM | POA: Insufficient documentation

## 2015-10-13 DIAGNOSIS — E039 Hypothyroidism, unspecified: Secondary | ICD-10-CM | POA: Insufficient documentation

## 2015-10-13 DIAGNOSIS — D497 Neoplasm of unspecified behavior of endocrine glands and other parts of nervous system: Secondary | ICD-10-CM | POA: Insufficient documentation

## 2015-10-13 NOTE — Progress Notes (Signed)
GYN ENCOUNTER NOTE  Subjective:       Laura Walters is a 82 y.o. No obstetric history on file. female is here for gynecologic evaluation of the following issues:  1. UTI symptoms.    2.  Prolapse  Patient reports urinary frequency and urgency.  Not expressing dysuria at this time.  She does have past history of UTIs.  She does have history of urinary incontinence.  Gynecologic History No LMP recorded. Patient is postmenopausal. Contraception:Status post hysterectomy-TAH, 1980 Cystocele Rectocele Urge incontinence Stress incontinence  Obstetric History Para 2002  Past Medical History  Diagnosis Date  . Arthritis   . Cardiomyopathy ([place])     idiopathic  . Hypertension   . Neuropathy ([place])   . DDD (degenerative disc disease), lumbar   . RAD (reactive airway disease)   . Chest pain     non cardiac  . Cancer of thyroid ([place]) 03/30/2015  . Urge incontinence   . IBS (irritable bowel syndrome)   . Seasonal allergies   . Hypothyroidism   . Nocturia   . Vaginal polyp   . Low back pain   . Cystocele   . Rectocele   . Pelvic pain in female   . Vaginal atrophy   . Vulvitis   . Constipation     Past Surgical History  Procedure Laterality Date  . Pituitary surgery    . Thyroidectomy    . Hemorroidectomy      x 2  . Parotidectomy    . Dilation and curettage of uterus    . Left total hip arthroplasty    . Knee arthroscopy Right 05/19/2015    Procedure: Right knee arthrosocpy medail and lateral menisectomy, chondroplasty ;  Surgeon: Dereck Leep, MD;  Location: [facility] ORS;  Service: Orthopedics;  Laterality: Right;  . Breast biopsy Right 86 and 92    2 bx-neg  . Abdominal hysterectomy      Current Outpatient Prescriptions on File Prior to Visit  Medication Sig Dispense Refill  . carvedilol (COREG) 3.125 MG tablet Take 3.125 mg by mouth 2 (two) times daily with a meal.    . DULoxetine (CYMBALTA) 60 MG capsule Take 60 mg by mouth daily.    . ferrous sulfate 325 (65 FE) MG  tablet Take 325 mg by mouth 2 (two) times daily with a meal.    . fluticasone (FLONASE) 50 MCG/ACT nasal spray Place into the nose.    Marland Kitchen Fluticasone-Salmeterol (ADVAIR DISKUS) 250-50 MCG/DOSE AEPB Inhale 1 puff into the lungs 2 (two) times daily.    . furosemide (LASIX) 20 MG tablet Take 40 mg by mouth daily.    Marland Kitchen gabapentin (NEURONTIN) 400 MG capsule Take 400 mg by mouth 4 (four) times daily.     Marland Kitchen HYDROcodone-acetaminophen (NORCO) 5-325 MG per tablet Take 1-2 tablets by mouth every 4 (four) hours as needed for moderate pain. 60 tablet 0  . levothyroxine (SYNTHROID, LEVOTHROID) 100 MCG tablet Take 100 mcg by mouth daily before breakfast.    . MAGNESIUM HYDROXIDE PO Take 30 mLs by mouth 2 (two) times daily as needed (constipation). 8% oral suspension    . Multiple Vitamins-Minerals (PRESERVISION AREDS 2) CAPS Take 1 capsule by mouth 2 (two) times daily.    Marland Kitchen omeprazole (PRILOSEC) 40 MG capsule Take 40 mg by mouth daily.     No current facility-administered medications on file prior to visit.    Allergies  Allergen Reactions  . Aleve [Naproxen]   . Codeine Hives and  Itching  . Penicillins Hives  . Sulfa Antibiotics Hives    Social History   Social History  . Marital Status: Widowed    Spouse Name: N/A  . Number of Children: N/A  . Years of Education: N/A   Occupational History  . Not on file.   Social History Main Topics  . Smoking status: Never Smoker   . Smokeless tobacco: Never Used  . Alcohol Use: No  . Drug Use: No  . Sexual Activity: No   Other Topics Concern  . Not on file   Social History Narrative    Family History  Problem Relation Age of Onset  . Heart disease Father   . Diabetes Paternal Uncle   . Cancer Neg Hx     The following portions of the patient's history were reviewed and updated as appropriate: allergies, current medications, past family history, past medical history, past social history, past surgical history and problem list.  Review of  Systems Review of Systems - General ROS: negative for - chills, fatigue, fever, hot flashes, malaise or night sweats Hematological and Lymphatic ROS: negative for - bleeding problems or swollen lymph nodes Gastrointestinal ROS: negative for - abdominal pain, blood in stools, change in bowel habits and nausea/vomiting Musculoskeletal ROS: negative for - joint pain, muscle pain or muscular weakness Genito-Urinary [id number] for incontinence, urge incontinence, pelvic pressure  Objective:   BP 128/70 mmHg  Pulse 76  Ht 5' (1.524 m)  Wt 130 lb 4.8 oz (59.104 kg)  BMI 25.45 kg/m2 CONSTITUTIONAL: Thin, elderly, frail female in no acute distress.  HENT:  Normocephalic, atraumatic.  NECK: Not examined SKIN: Warm and dry. No rash noted. Not diaphoretic. No erythema. No pallor. [place]: Alert and oriented to person, place, and time. PSYCHIATRIC: Normal mood and affect. Normal behavior. Normal judgment and thought content. CARDIOVASCULAR:Not Examined RESPIRATORY: Not Examined BREASTS: Not Examined ABDOMEN: Soft, non distended; Non tender.  No Organomegaly. PELVIC:  External Genitalia: Atrophic  BUS: Normal  Vagina: Second to third degree cystocele; Moderate rectocele  Cervix: Surgically absent  Uterus: Surgically absent  Adnexa: Normal  RV: Normal External exam  Bladder: Nontender MUSCULOSKELETAL: Normal range of motion. No tenderness.  No cyanosis, clubbing, or edema.   PROCEDURE: Pessary fitting. Gellhorn 2-1/2 inch pessary is fitted successfully  Assessment:   1. Frequency of urination - POCT urinalysis dipstick - Urine culture   2.  Second to third- degree cystocele, symptomatic  3.  Moderate rectocele.  4.  Vaginal atrophy  Plan:   1.  UA with C&S. 2.  Pessary is fitted today. 3.  Return in 2 weeks for pessary insertion when it arrives  Brayton Mars, MD  Note: This dictation was prepared with Dragon dictation along with smaller phrase technology. Any  transcriptional errors that result from this process are unintentional.

## 2015-10-13 NOTE — Patient Instructions (Signed)
1.  Gelfoam pessary 2-1/2 inch is Fitted today.  You'll be called when pessary arrives for insertion. 2.  Urine culture is sent and she will be notified if abnormal.

## 2015-10-20 ENCOUNTER — Other Ambulatory Visit: Payer: Self-pay | Admitting: *Deleted

## 2015-10-20 DIAGNOSIS — C73 Malignant neoplasm of thyroid gland: Secondary | ICD-10-CM

## 2015-10-21 ENCOUNTER — Ambulatory Visit (INDEPENDENT_AMBULATORY_CARE_PROVIDER_SITE_OTHER): Payer: Medicare Other | Admitting: Obstetrics and Gynecology

## 2015-10-21 ENCOUNTER — Encounter: Payer: Self-pay | Admitting: Obstetrics and Gynecology

## 2015-10-21 VITALS — BP 129/79 | HR 78 | Ht 60.0 in | Wt 129.7 lb

## 2015-10-21 DIAGNOSIS — K469 Unspecified abdominal hernia without obstruction or gangrene: Secondary | ICD-10-CM

## 2015-10-21 DIAGNOSIS — N390 Urinary tract infection, site not specified: Secondary | ICD-10-CM

## 2015-10-21 DIAGNOSIS — N762 Acute vulvitis: Secondary | ICD-10-CM | POA: Diagnosis not present

## 2015-10-21 DIAGNOSIS — N816 Rectocele: Secondary | ICD-10-CM | POA: Diagnosis not present

## 2015-10-21 DIAGNOSIS — N8110 Cystocele, unspecified: Secondary | ICD-10-CM

## 2015-10-21 DIAGNOSIS — IMO0002 Reserved for concepts with insufficient information to code with codable children: Secondary | ICD-10-CM

## 2015-10-21 LAB — POCT URINALYSIS DIPSTICK
Bilirubin, UA: NEGATIVE
Blood, UA: NEGATIVE
Glucose, UA: NEGATIVE
KETONES UA: NEGATIVE
Nitrite, UA: NEGATIVE
PH UA: 7.5
PROTEIN UA: NEGATIVE
SPEC GRAV UA: 1.01
Urobilinogen, UA: 0.2

## 2015-10-21 MED ORDER — FLUCONAZOLE 150 MG PO TABS: 150.0000 mg | ORAL_TABLET | Freq: Every day | ORAL | Status: DC

## 2015-10-21 NOTE — Progress Notes (Signed)
Chief complaint: 1.  Pessary insertion. 2.  UTI symptoms. 3.  Vaginitis symptoms..  Patient presents for new Gellhorn pessary insertion.  This past week.  She was treated for UTI and has some mild irritative voiding symptoms.  Urinalysis and culture will be sent today to verify that UTI has resolved.  Patient is also complaining of vulvovaginal itching with discharge.  Past Medical History  Diagnosis Date  . Arthritis   . Cardiomyopathy ([place])     idiopathic  . Hypertension   . Neuropathy ([place])   . DDD (degenerative disc disease), lumbar   . RAD (reactive airway disease)   . Chest pain     non cardiac  . Cancer of thyroid ([place]) 03/30/2015  . Urge incontinence   . IBS (irritable bowel syndrome)   . Seasonal allergies   . Hypothyroidism   . Nocturia   . Vaginal polyp   . Low back pain   . Cystocele   . Rectocele   . Pelvic pain in female   . Vaginal atrophy   . Vulvitis   . Constipation    Past Surgical History  Procedure Laterality Date  . Pituitary surgery    . Thyroidectomy    . Hemorroidectomy      x 2  . Parotidectomy    . Dilation and curettage of uterus    . Left total hip arthroplasty    . Knee arthroscopy Right 05/19/2015    Procedure: Right knee arthrosocpy medail and lateral menisectomy, chondroplasty ;  Surgeon: Dereck Leep, MD;  Location: [facility] ORS;  Service: Orthopedics;  Laterality: Right;  . Breast biopsy Right 86 and 92    2 bx-neg  . Abdominal hysterectomy      Review of systems: Per HPI  OBJECTIVE: BP 129/79 mmHg  Pulse 78  Ht 5' (1.524 m)  Wt 129 lb 11.2 oz (58.832 kg)  BMI 25.33 kg/m2 Pleasant, well-appearing female in no acute distress. Abdomen: Soft, nontender. Pelvic exam: External genitalia-atrophic. BUS-urethral caruncle present.. Vagina-third degree cystocele; moderate rectocele; vaginal enterocele. No evidence of discharge. Cervix-surgically absent Uterus-surgically absent. Bimanual exam-not performed   ASSESSMENT: 1.   Pelvic organ prolapse (cystocele, rectocele, enterocele) 2.  Vaginitis symptoms  Without evidence of discharge for analysis. 3.  Status post UTI treatment, possibly monilia vaginitis. 4.  Gellhorn pessary insertion today.  PLAN: 1.  Urinalysis with urine culture. 2.  Gellhorn pessary is Inserted 3.  Diflucan 150 mg by mouth 1 dose. 4.  Return in 2 weeks for follow-up  Brayton Mars, MD  Note: This dictation was prepared with Dragon dictation along with smaller phrase technology. Any transcriptional errors that result from this process are unintentional.

## 2015-10-21 NOTE — Patient Instructions (Signed)
1.  Diflucan 150 mg tablet, is given for yeast infection. 2.  Pessary is inserted today. 3.  Return in 2 weeks for Pessary  maintenance

## 2015-10-23 ENCOUNTER — Inpatient Hospital Stay (HOSPITAL_BASED_OUTPATIENT_CLINIC_OR_DEPARTMENT_OTHER): Payer: Medicare Other | Admitting: Oncology

## 2015-10-23 ENCOUNTER — Encounter: Payer: Self-pay | Admitting: Oncology

## 2015-10-23 ENCOUNTER — Inpatient Hospital Stay: Payer: Medicare Other | Attending: Oncology

## 2015-10-23 VITALS — BP 112/74 | HR 73 | Temp 95.0°F | Resp 18 | Wt 132.5 lb

## 2015-10-23 DIAGNOSIS — E039 Hypothyroidism, unspecified: Secondary | ICD-10-CM | POA: Insufficient documentation

## 2015-10-23 DIAGNOSIS — I10 Essential (primary) hypertension: Secondary | ICD-10-CM | POA: Diagnosis not present

## 2015-10-23 DIAGNOSIS — Z96642 Presence of left artificial hip joint: Secondary | ICD-10-CM | POA: Insufficient documentation

## 2015-10-23 DIAGNOSIS — Z85850 Personal history of malignant neoplasm of thyroid: Secondary | ICD-10-CM

## 2015-10-23 DIAGNOSIS — I429 Cardiomyopathy, unspecified: Secondary | ICD-10-CM

## 2015-10-23 DIAGNOSIS — Z79899 Other long term (current) drug therapy: Secondary | ICD-10-CM | POA: Insufficient documentation

## 2015-10-23 DIAGNOSIS — R350 Frequency of micturition: Secondary | ICD-10-CM | POA: Insufficient documentation

## 2015-10-23 DIAGNOSIS — C73 Malignant neoplasm of thyroid gland: Secondary | ICD-10-CM

## 2015-10-23 DIAGNOSIS — G629 Polyneuropathy, unspecified: Secondary | ICD-10-CM | POA: Diagnosis not present

## 2015-10-23 DIAGNOSIS — M1990 Unspecified osteoarthritis, unspecified site: Secondary | ICD-10-CM | POA: Diagnosis not present

## 2015-10-23 DIAGNOSIS — R911 Solitary pulmonary nodule: Secondary | ICD-10-CM

## 2015-10-23 LAB — COMPREHENSIVE METABOLIC PANEL
ALBUMIN: 4.1 g/dL (ref 3.5–5.0)
ALK PHOS: 153 U/L — AB (ref 38–126)
ALT: 23 U/L (ref 14–54)
AST: 29 U/L (ref 15–41)
Anion gap: 7 (ref 5–15)
BUN: 14 mg/dL (ref 6–20)
CALCIUM: 8.1 mg/dL — AB (ref 8.9–10.3)
CO2: 29 mmol/L (ref 22–32)
CREATININE: 0.71 mg/dL (ref 0.44–1.00)
Chloride: 94 mmol/L — ABNORMAL LOW (ref 101–111)
GFR calc Af Amer: >60 mL/min (ref >60)
GFR calc non Af Amer: >60 mL/min (ref >60)
GLUCOSE: 101 mg/dL — AB (ref 65–99)
Potassium: 4.1 mmol/L (ref 3.5–5.1)
SODIUM: 130 mmol/L — AB (ref 135–145)
Total Bilirubin: 0.4 mg/dL (ref 0.3–1.2)
Total Protein: 7.2 g/dL (ref 6.5–8.1)

## 2015-10-23 LAB — CBC WITH DIFFERENTIAL/PLATELET
BASOS PCT: 1 %
Basophils Absolute: 0 10*3/uL (ref 0–0.1)
EOS ABS: 0.6 10*3/uL (ref 0–0.7)
Eosinophils Relative: 5 %
HCT: 39.1 % (ref 35.0–47.0)
HEMOGLOBIN: 13.3 g/dL (ref 12.0–16.0)
LYMPHS ABS: 1 10*3/uL (ref 1.0–3.6)
Lymphocytes Relative: 10 %
MCH: 29.3 pg (ref 26.0–34.0)
MCHC: 34 g/dL (ref 32.0–36.0)
MCV: 86.2 fL (ref 80.0–100.0)
Monocytes Absolute: 1.2 10*3/uL — ABNORMAL HIGH (ref 0.2–0.9)
Monocytes Relative: 11 %
NEUTROS PCT: 73 %
Neutro Abs: 8.2 10*3/uL — ABNORMAL HIGH (ref 1.4–6.5)
Platelets: 281 10*3/uL (ref 150–440)
RBC: 4.54 MIL/uL (ref 3.80–5.20)
RDW: 14.9 % — ABNORMAL HIGH (ref 11.5–14.5)
WBC: 11 10*3/uL (ref 3.6–11.0)

## 2015-10-23 LAB — URINE CULTURE

## 2015-10-23 LAB — TSH: TSH: 0.441 u[IU]/mL (ref 0.350–4.500)

## 2015-10-23 NOTE — Progress Notes (Signed)
[place] @ [facility] [phone]  Fax:([phone] [place]: Feb 10, 1933  MR#: [id number]  IWP#:[id number]  Patient Care Team: Idelle Crouch, MD as PCP - General (Internal Medicine)  CHIEF COMPLAINT:  Chief Complaint  Patient presents with  . Thyroid Cancer    Oncology History   Chief Complaint/Problem List  1.  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  Status post thyroidectomy and lymph node dissection in December 2003.  2.  Status post I-131 therapy in February 2004. 3.lung nodule some CT scan (May, 2012) 4.PET scan has been positive to the lung nodules and supraclavicular lymph node (September, 2012) 5.biopsy from supraclavicular lymph node was positive for  malignancy consistent with thyroid cancer 6.patient has received I-131 treatment in mid December, 2012        Cancer of thyroid [facility] [address])   03/30/2015 Initial Diagnosis Cancer of thyroid    Oncology Flowsheet 05/19/2015  dexamethasone (DECADRON) IJ -  ondansetron (ZOFRAN) IV -    INTERVAL HISTORY:  82 year old lady with stage IV carcinoma of thyroid.Came today further follow-up.  In the interim.  she has had left hip replacement.  Avascular necrosis of the left hip joint was found. Is gradually recovering: Undergoing rehabilitation therapy No cough or shortness of breath.  Appetite has been stable.  Patient continues to have severe numbness in lower extremity Patient is here for ongoing evaluation and consideration of treatment regarding thyroid cancer Patient continues to have problems with arthritis and knee replacement hip replacement  REVIEW OF SYSTEMS:   Gen. status: Patient is recovering from the left hip surgery HEENT: No headache.  No dizziness.  No difficulty swallowing Cardiac: No chest pain Lungs: No cough or shortness of breath GI: Nausea no vomiting no diarrhea GU: No dysuria or hematuria Lower extremity no swelling Neurological system no headache no dizziness As per  HPI. Otherwise, a complete review of systems is negatve.  PAST MEDICAL HISTORY: Past Medical History  Diagnosis Date  . Arthritis   . Cardiomyopathy ([place])     idiopathic  . Hypertension   . Neuropathy ([place])   . DDD (degenerative disc disease), lumbar   . RAD (reactive airway disease)   . Chest pain     non cardiac  . Cancer of thyroid ([place]) 03/30/2015  . Urge incontinence   . IBS (irritable bowel syndrome)   . Seasonal allergies   . Hypothyroidism   . Nocturia   . Vaginal polyp   . Low back pain   . Cystocele   . Rectocele   . Pelvic pain in female   . Vaginal atrophy   . Vulvitis   . Constipation     ificant History/PMH:   cholesterol:    Allergic Rhinitis:    Osteoporosis:    thyroid Cancer:    breast biospy:    Thyroidectomy:    Pituitary Surgery:   PFSH: Additional Past Medical and Surgical History: Past Medical History   Hemorrhoids.   Acute bronchitis.    Past Surgical History   Had previous history of breast surgery, negative for any malignancy.    Ovarian cyst removed, benign.    Family History   No family history of colorectal cancer, breast cancer, or ovarian cancer.    Social History   Does not smoke.  Does not drink alcohol.   ADVANCED DIRECTIVES:  Patient does have a living will and does not want to make any changes at present time  HEALTH MAINTENANCE: Social History  Substance Use Topics  . Smoking status: Never Smoker   . Smokeless tobacco: Never Used  . Alcohol Use: No      Allergies  Allergen Reactions  . Aleve [Naproxen]   . Codeine Hives and Itching  . Penicillins Hives  . Sulfa Antibiotics Hives    Current Outpatient Prescriptions  Medication Sig Dispense Refill  . carvedilol (COREG) 3.125 MG tablet Take 3.125 mg by mouth 2 (two) times daily with a meal.    . DULoxetine (CYMBALTA) 60 MG capsule Take 60 mg by mouth daily.    . ferrous sulfate 325 (65 FE) MG tablet Take 325 mg by mouth 2 (two) times daily with a  meal.    . fexofenadine-pseudoephedrine (ALLEGRA-D 24) 180-240 MG 24 hr tablet     . fluconazole (DIFLUCAN) 150 MG tablet Take 1 tablet (150 mg total) by mouth daily. 1 tablet 0  . fluticasone (FLONASE) 50 MCG/ACT nasal spray Place into the nose.    Marland Kitchen Fluticasone-Salmeterol (ADVAIR DISKUS) 250-50 MCG/DOSE AEPB Inhale 1 puff into the lungs 2 (two) times daily.    . furosemide (LASIX) 20 MG tablet Take 40 mg by mouth daily.    Marland Kitchen gabapentin (NEURONTIN) 400 MG capsule Take 400 mg by mouth 4 (four) times daily.     Marland Kitchen HYDROcodone-acetaminophen (NORCO) 10-325 MG tablet     . levothyroxine (SYNTHROID, LEVOTHROID) 100 MCG tablet Take 100 mcg by mouth daily before breakfast.    . MAGNESIUM HYDROXIDE PO Take 30 mLs by mouth 2 (two) times daily as needed (constipation). 8% oral suspension    . Multiple Vitamins-Minerals (PRESERVISION AREDS 2) CAPS Take 1 capsule by mouth 2 (two) times daily.    Marland Kitchen nystatin (MYCOSTATIN) powder Apply topically.    Marland Kitchen omeprazole (PRILOSEC) 40 MG capsule Take 40 mg by mouth daily.     No current facility-administered medications for this visit.    OBJECTIVE:  Filed Vitals:   10/23/15 1139  BP: 112/74  Pulse: 73  Temp: 95 F (35 C)  Resp: 18     Body mass index is 25.88 kg/(m^2).    ECOG FS:2 - Symptomatic, <50% confined to bed  PHYSICAL EXAM: Gen. status: Patient is alert oriented gradually recovering from the hip surgery HEENT: Thyroid within normal limit.  No palpable masses.  No palpable lymph node Lymphatic system: Supraclavicular, cervical, axillary, inguinal lymph nodes are not palpable Examination of the chest was unremarkable. There were no bony deformities, no asymmetry, and no other abnormalities. Abdominal exam revealed normal bowel sounds. The abdomen was soft, non-tender, and without masses, organomegaly, or appreciable enlargement of the abdominal aorta. Examination of the skin revealed no evidence of significant rashes, suspicious appearing nevi or  other concerning lesions. Neurological system:: Peripheral neuropathy   LAB RESULTS:  Appointment on 10/23/2015  Component Date Value Ref Range Status  . WBC 10/23/2015 11.0  3.6 - 11.0 K/uL Final  . RBC 10/23/2015 4.54  3.80 - 5.20 MIL/uL Final  . Hemoglobin 10/23/2015 13.3  12.0 - 16.0 g/dL Final  . HCT 10/23/2015 39.1  35.0 - 47.0 % Final  . MCV 10/23/2015 86.2  80.0 - 100.0 fL Final  . MCH 10/23/2015 29.3  26.0 - 34.0 pg Final  . MCHC 10/23/2015 34.0  32.0 - 36.0 g/dL Final  . RDW 10/23/2015 14.9* 11.5 - 14.5 % Final  . Platelets 10/23/2015 281  150 - 440 K/uL Final  . Neutrophils Relative % 10/23/2015 73   Final  . Neutro Abs 10/23/2015 8.2*  1.4 - 6.5 K/uL Final  . Lymphocytes Relative 10/23/2015 10   Final  . Lymphs Abs 10/23/2015 1.0  1.0 - 3.6 K/uL Final  . Monocytes Relative 10/23/2015 11   Final  . Monocytes Absolute 10/23/2015 1.2* 0.2 - 0.9 K/uL Final  . Eosinophils Relative 10/23/2015 5   Final  . Eosinophils Absolute 10/23/2015 0.6  0 - 0.7 K/uL Final  . Basophils Relative 10/23/2015 1   Final  . Basophils Absolute 10/23/2015 0.0  0 - 0.1 K/uL Final  . Sodium 10/23/2015 130* 135 - 145 mmol/L Final  . Potassium 10/23/2015 4.1  3.5 - 5.1 mmol/L Final  . Chloride 10/23/2015 94* 101 - 111 mmol/L Final  . CO2 10/23/2015 29  22 - 32 mmol/L Final  . Glucose, Bld 10/23/2015 101* 65 - 99 mg/dL Final  . BUN 10/23/2015 14  6 - 20 mg/dL Final  . Creatinine, Ser 10/23/2015 0.71  0.44 - 1.00 mg/dL Final  . Calcium 10/23/2015 8.1* 8.9 - 10.3 mg/dL Final  . Total Protein 10/23/2015 7.2  6.5 - 8.1 g/dL Final  . Albumin 10/23/2015 4.1  3.5 - 5.0 g/dL Final  . AST 10/23/2015 29  15 - 41 U/L Final  . ALT 10/23/2015 23  14 - 54 U/L Final  . Alkaline Phosphatase 10/23/2015 153* 38 - 126 U/L Final  . Total Bilirubin 10/23/2015 0.4  0.3 - 1.2 mg/dL Final  . GFR calc non Af Amer 10/23/2015 >60  >60 mL/min Final  . GFR calc Af Amer 10/23/2015 >60  >60 mL/min Final   Comment:  (NOTE) The eGFR has been calculated using the CKD EPI equation. This calculation has not been validated in all clinical situations. eGFR's persistently <60 mL/min signify possible Chronic Kidney Disease.   . Anion gap 10/23/2015 7  5 - 15 Final      ASSESSMENT: Stage IV carcinoma of the thyroid, with metastases to lung stable disease Left hip surgery.  Avascular necrosis Peripheral neuropathy Multiple surgeries for knee and hip replacement  MEDICAL DECISION MAKING:  All lab data has been reviewed.  Patient had some complaints of urinary frequency but cultures sent urine analysis is within normal limit. All lab data has been reviewed.  No change in the Synthroid dose. Considering patient's pulmonary nodule a repeat PET scan for follow-up TSH  IS 0.441 t4 is pending   Patient expressed understanding and was in agreement with this plan. She also understands that She can call clinic at any time with any questions, concerns, or complaints.    No matching staging information was found for the patient.  Forest Gleason, MD   10/23/2015 11:50 AM

## 2015-10-27 ENCOUNTER — Telehealth: Payer: Self-pay | Admitting: Obstetrics and Gynecology

## 2015-10-27 LAB — THYROGLOBULIN LEVEL

## 2015-10-27 LAB — T4: T4, Total: 7.5 ug/dL (ref 4.5–12.0)

## 2015-10-27 MED ORDER — CIPROFLOXACIN HCL 500 MG PO TABS: 500.0000 mg | ORAL_TABLET | Freq: Two times a day (BID) | ORAL | Status: DC

## 2015-10-27 NOTE — Telephone Encounter (Signed)
Pt aware.

## 2015-11-04 ENCOUNTER — Ambulatory Visit (INDEPENDENT_AMBULATORY_CARE_PROVIDER_SITE_OTHER): Payer: Medicare Other | Admitting: Obstetrics and Gynecology

## 2015-11-04 DIAGNOSIS — N390 Urinary tract infection, site not specified: Secondary | ICD-10-CM | POA: Diagnosis not present

## 2015-11-04 DIAGNOSIS — N816 Rectocele: Secondary | ICD-10-CM | POA: Diagnosis not present

## 2015-11-04 DIAGNOSIS — N8110 Cystocele, unspecified: Secondary | ICD-10-CM

## 2015-11-04 DIAGNOSIS — IMO0002 Reserved for concepts with insufficient information to code with codable children: Secondary | ICD-10-CM

## 2015-11-04 LAB — POCT URINALYSIS DIPSTICK
BILIRUBIN UA: NEGATIVE
GLUCOSE UA: NEGATIVE
Ketones, UA: NEGATIVE
NITRITE UA: POSITIVE
PH UA: 7
Protein, UA: NEGATIVE
SPEC GRAV UA: 1.01
UROBILINOGEN UA: 0.2

## 2015-11-04 MED ORDER — CIPROFLOXACIN HCL 500 MG PO TABS: 500.0000 mg | ORAL_TABLET | Freq: Two times a day (BID) | ORAL | Status: DC

## 2015-11-04 NOTE — Progress Notes (Signed)
Chief complaint: 1.  Pelvic organ prolapse. 2.  Recurrent UTI.  Patient presents for pessary check.  After insertion of new pessary last week.  She is pleased with the support.  The pessary is giving her.   She does not report any UTI symptoms; however, urinalysis today does reveal positive nitrites.  She did complete a 3 day course of Cipro without success.  OBJECTIVE: There were no vitals taken for this visit. Pleasant, well-appearing female in no acute distress. Abdomen: Soft, nontender. Pelvic exam: External genitalia-atrophic. BUS-urethral caruncle present.. Vagina-third degree cystocele; moderate rectocele; vaginal enterocele. No evidence of discharge. Cervix-surgically absent Uterus-surgically absent. Bimanual exam-not performed  PROCEDURE: Pessary is removed, cleaned, and reinserted.  ASSESSMENT: 1.  Recurrent UTI. 2.  Pelvic organ prolapse with normal pessary check.  PLAN: 1.  Complete 7 day course of Cipro 500 mg twice a day. 2.  Discontinue Cymbalta while taking Cipro. 3.  Return in 1 month for pessary maintenance  A total of 15 minutes were spent face-to-face with the patient during this encounter and over half of that time dealt with counseling and coordination of care.  Brayton Mars, MD  Note: This dictation was prepared with Dragon dictation along with smaller phrase technology. Any transcriptional errors that result from this process are unintentional.

## 2015-11-04 NOTE — Patient Instructions (Signed)
1.  Takes Cipro twice a day for 7.  Discontinue Cymbalta while taking Cipro. 2.  Return in 4 weeks for pessary check

## 2015-11-06 LAB — URINE CULTURE

## 2015-11-10 ENCOUNTER — Telehealth: Payer: Self-pay

## 2015-11-10 MED ORDER — FLUCONAZOLE 150 MG PO TABS: 150.0000 mg | ORAL_TABLET | Freq: Every day | ORAL | Status: DC

## 2015-11-10 MED ORDER — NITROFURANTOIN MONOHYD MACRO 100 MG PO CAPS: 100.0000 mg | ORAL_CAPSULE | Freq: Two times a day (BID) | ORAL | Status: DC

## 2015-11-10 NOTE — Telephone Encounter (Signed)
Pt aware. Med erx. Pt requested diflucan. Erx with 1 refill.

## 2015-11-10 NOTE — Telephone Encounter (Signed)
-----   Message from Brayton Mars, MD sent at 11/09/2015  3:38 PM EST ----- Please notify - Abnormal Labs Stop Cipro.  Start Macrobid twice a day for 7 days

## 2015-11-12 ENCOUNTER — Inpatient Hospital Stay: Admission: RE | Admit: 2015-11-12 | Payer: Medicare Other | Source: Ambulatory Visit

## 2015-11-14 ENCOUNTER — Other Ambulatory Visit: Payer: Self-pay | Admitting: Gastroenterology

## 2015-11-14 DIAGNOSIS — R1310 Dysphagia, unspecified: Secondary | ICD-10-CM

## 2015-11-21 ENCOUNTER — Ambulatory Visit
Admission: RE | Admit: 2015-11-21 | Discharge: 2015-11-21 | Disposition: A | Discharge status: Home / Self Care | Payer: Medicare Other | Source: Ambulatory Visit | Attending: Gastroenterology | Admitting: Gastroenterology

## 2015-11-21 DIAGNOSIS — R1310 Dysphagia, unspecified: Secondary | ICD-10-CM | POA: Diagnosis not present

## 2015-11-21 DIAGNOSIS — R1312 Dysphagia, oropharyngeal phase: Secondary | ICD-10-CM

## 2015-11-21 NOTE — Therapy (Signed)
[facility] [place], [name], [address] Phone: [phone]   Fax:     Modified Barium Swallow  Patient Details  Name: Laura Walters MRN: [id number] Date of Birth: 27-Mar-1933 No Data Recorded  Encounter Date: 11/21/2015    Past Medical History  Diagnosis Date  . Arthritis   . Cardiomyopathy ([place])     idiopathic  . Hypertension   . Neuropathy ([place])   . DDD (degenerative disc disease), lumbar   . RAD (reactive airway disease)   . Chest pain     non cardiac  . Cancer of thyroid ([place]) 03/30/2015  . Urge incontinence   . IBS (irritable bowel syndrome)   . Seasonal allergies   . Hypothyroidism   . Nocturia   . Vaginal polyp   . Low back pain   . Cystocele   . Rectocele   . Pelvic pain in female   . Vaginal atrophy   . Vulvitis   . Constipation     Past Surgical History  Procedure Laterality Date  . Pituitary surgery    . Thyroidectomy    . Hemorroidectomy      x 2  . Parotidectomy    . Dilation and curettage of uterus    . Left total hip arthroplasty    . Knee arthroscopy Right 05/19/2015    Procedure: Right knee arthrosocpy medail and lateral menisectomy, chondroplasty ;  Surgeon: Dereck Leep, MD;  Location: [facility] ORS;  Service: Orthopedics;  Laterality: Right;  . Breast biopsy Right 86 and 92    2 bx-neg  . Abdominal hysterectomy      There were no vitals filed for this visit.  Visit Diagnosis: Oropharyngeal dysphagia  Dysphagia - Plan: DG OP Swallowing Func-Medicare/Speech Path, DG OP Swallowing Func-Medicare/Speech Path   Subjective: Patient behavior: (alertness, ability to follow instructions, etc.): Able to verbalize swallowing history and follow directions for this study.  Chief complaint: difficulty initiating swallows and difficulty swallowing pills   Objective:  Radiological Procedure: A videoflouroscopic evaluation of oral-preparatory, reflex initiation, and pharyngeal  phases of the swallow was performed; as well as a screening of the upper esophageal phase.  I. POSTURE: Upright in MBS chair  II. VIEW: Lateral  III. COMPENSATORY STRATEGIES: Small sips (prevents deep laryngeal penetration)  IV. BOLUSES ADMINISTERED:   Thin Liquid: 2 small sips, 2 large sips   Nectar-thick Liquid: 1 moderate sip    Puree: 2 teaspoon boluses   Mechanical Soft: 1/4 graham cracker in applesauce  V. RESULTS OF EVALUATION: A. ORAL PREPARATORY PHASE: (The lips, tongue, and velum are observed for strength and coordination)       **Overall Severity Rating: within normal limits  B. SWALLOW INITIATION/REFLEX: (The reflex is normal if "triggered" by the time the bolus reached the base of the tongue)  triggers at the pyriform sinuses with large boluses thin liquid and while falling from the valleculae to the pyriform sinuses for everything else.   **Overall Severity Rating: Moderate  PHARYNGEAL PHASE: (Pharyngeal function is normal if the bolus shows rapid, smooth, and continuous transit through the pharynx and there is no pharyngeal residue after the swallow)  Reduced hyolaryngeal excursion and laryngeal vestibule closure at the height of the swallow  **Overall Severity Rating: Mild  C. LARYNGEAL PENETRATION: (Material entering into the laryngeal inlet/vestibule but not aspirated) Shallow with trace epiglottic undercoating with 3 of 6 boluses (solid, nectar thick, and small thin) and deep with trace residue at the  top of the vocal cords with 1 of 2 large thin boluses   D. ASPIRATION: None observed, risk for aspiration of laryngeal vestibule residue  E. ESOPHAGEAL PHASE: (Screening of the upper esophagus) In the cervical esophagus there is a finger-like protrusion along the posterior wall during swallow consistent with prominent cricopharyngeus.   ASSESSMENT: 82 year old woman, with swallowing complaints including difficulty initiating swallowing and difficulty with pills, is  presenting with mild oropharyngeal dysphagia.  Oral control of the bolus including oral hold, rotary mastication, and anterior to posterior transfer is within normal limits. Timing of the pharyngeal swallow is delayed- triggering at the pyriform sinuses with large boluses thin liquid and while falling from the valleculae to the pyriform sinuses for everything else.  Hyolaryngeal excursion and laryngeal vestibule closure at the height of the swallow are reduced, allowing laryngeal penetration.  There is inconsistent laryngeal penetration with trace epiglottic undercoating with solids, nectar-thick liquid, and small sips of thin liquid.  There is deep laryngeal penetration, with trace residue on top of the vocal cords, with 1 of 2 large thin liquid boluses.  There was no observed tracheal aspiration during this study.  In the cervical esophagus there is a finger-like protrusion along the posterior wall during swallow consistent with prominent cricopharyngeus.  The patient was advised to take small sips, to take pills in yogurt, and to maintain stringent oral care.  The patient has hoarse vocal quality and may benefit from referral to ENT to assess vocal fold function.  PLAN/RECOMMENDATIONS:   A. Diet: Regular (soften and/or moisten solids as needed)   B. Swallowing Precautions: SMALL sips, stringent oral care, meds in yogurt, avoid tossing head back when swallowing   C. Recommended consultation to ENT secondary dysphonia   D. Therapy recommendations N/A   E. Results and recommendations were discussed with the patient immediately following the study and final report routed to referring MD.          G-Codes - Dec 02, 2015 1531    Functional Assessment Tool Used MBS, clinical judgment   Functional Limitations Swallowing   Swallow Current Status [id number]) At least 20 percent but less than 40 percent impaired, limited or restricted   Swallow Goal Status [id number]) At least 20 percent but less than 40 percent  impaired, limited or restricted   Swallow Discharge Status [phone]) At least 20 percent but less than 40 percent impaired, limited or restricted          Problem List Patient Active Problem List   Diagnosis Date Noted  . Acquired hypothyroidism 10/13/2015  . Pituitary neoplasm ([place]) 10/13/2015  . Malignant neoplasm of thyroid gland ([place]) 10/13/2015  . Degeneration of intervertebral disc of lumbar region 08/01/2015  . Lumbar canal stenosis 08/01/2015  . Neuritis or radiculitis due to rupture of lumbar intervertebral disc 08/01/2015  . LBP (low back pain) 03/30/2015  . Neuropathy ([place]) 03/30/2015  . Chest pain, non-cardiac 03/30/2015  . Neoplasm of pituitary gland ([place]) 03/30/2015  . Cancer of thyroid ([place]) 03/30/2015  . Combined fat and carbohydrate induced hyperlipemia 10/17/2014  . Primary cardiomyopathy ([place]) 10/14/2014   Leroy Sea, MS/CCC- SLP  Lou Miner 12-02-2015, 3:32 PM  [facility] [place], [name], [address] Phone: [phone]   Fax:     Name: Laura Walters MRN: [id number] Date of Birth: March 17, 1933

## 2015-11-25 ENCOUNTER — Ambulatory Visit: Payer: Medicare Other | Admitting: Obstetrics and Gynecology

## 2015-11-26 ENCOUNTER — Encounter: Admission: RE | Payer: Self-pay | Source: Ambulatory Visit

## 2015-11-26 ENCOUNTER — Inpatient Hospital Stay: Admission: RE | Admit: 2015-11-26 | Payer: Medicare Other | Source: Ambulatory Visit | Admitting: Orthopedic Surgery

## 2015-11-26 DIAGNOSIS — N390 Urinary tract infection, site not specified: Secondary | ICD-10-CM | POA: Insufficient documentation

## 2015-11-26 SURGERY — ARTHROPLASTY, KNEE, TOTAL, USING IMAGELESS COMPUTER-ASSISTED NAVIGATION
Anesthesia: Choice | Laterality: Right

## 2015-12-02 ENCOUNTER — Ambulatory Visit (INDEPENDENT_AMBULATORY_CARE_PROVIDER_SITE_OTHER): Payer: Medicare Other | Admitting: Obstetrics and Gynecology

## 2015-12-02 ENCOUNTER — Encounter: Payer: Self-pay | Admitting: Obstetrics and Gynecology

## 2015-12-02 VITALS — BP 116/73 | HR 81 | Ht 60.0 in | Wt 130.2 lb

## 2015-12-02 DIAGNOSIS — N390 Urinary tract infection, site not specified: Secondary | ICD-10-CM

## 2015-12-02 DIAGNOSIS — N816 Rectocele: Secondary | ICD-10-CM | POA: Diagnosis not present

## 2015-12-02 DIAGNOSIS — K469 Unspecified abdominal hernia without obstruction or gangrene: Secondary | ICD-10-CM | POA: Insufficient documentation

## 2015-12-02 DIAGNOSIS — N8110 Cystocele, unspecified: Secondary | ICD-10-CM

## 2015-12-02 DIAGNOSIS — N8111 Cystocele, midline: Secondary | ICD-10-CM | POA: Insufficient documentation

## 2015-12-02 DIAGNOSIS — IMO0002 Reserved for concepts with insufficient information to code with codable children: Secondary | ICD-10-CM

## 2015-12-02 LAB — POCT URINALYSIS DIPSTICK
BILIRUBIN UA: NEGATIVE
Glucose, UA: NEGATIVE
KETONES UA: NEGATIVE
NITRITE UA: NEGATIVE
PROTEIN UA: NEGATIVE
Spec Grav, UA: <=1.005
Urobilinogen, UA: 0.2
pH, UA: 7.5

## 2015-12-02 NOTE — Progress Notes (Signed)
Chief complaint: 1.  Pessary maintenance 2.  Pelvic organ prolapse  Repeat pessary check.  Patient denies any vaginal bleeding, discharge, odor, pelvic pain.  Past medical history, past or problems, medications, and allergies are reviewed.  OBJECTIVE: BP 116/73 mmHg  Pulse 81  Ht 5' (1.524 m)  Wt 130 lb 3.2 oz (59.058 kg)  BMI 25.43 kg/m2 Pleasant, well-appearing female in no acute distress. Abdomen: Soft, nontender. Pelvic exam: External genitalia-atrophic. BUS-urethral caruncle present.. Vagina-third degree cystocele; moderate rectocele; vaginal enterocele. No evidence of discharge. Cervix-surgically absent Uterus-surgically absent. Bimanual exam-not performed   PROCEDURE: Pessary is removed, cleaned, and reinserted.  ASSESSMENT: 1.  Pelvic organ prolapse with good response to [place] pessary. 2.  Normal pessary maintenance  PLAN: 1.  Continue with TRIMO [place] gel weekly. 2.  Return in 1 month for pessary maintenance (prior to scheduled surgery).  A total of 15 minutes were spent face-to-face with the patient during this encounter and over half of that time dealt with counseling and coordination of care.  Brayton Mars, MD   Note: This dictation was prepared with Dragon dictation along with smaller phrase technology. Any transcriptional errors that result from this process are unintentional.   .

## 2015-12-02 NOTE — Patient Instructions (Signed)
1.  Return in early April for pessary maintenance.  Prior to knee surgery. 2.  Continue with [place] gel weekly

## 2015-12-04 LAB — URINE CULTURE

## 2015-12-16 DIAGNOSIS — M12569 Traumatic arthropathy, unspecified knee: Secondary | ICD-10-CM | POA: Insufficient documentation

## 2015-12-17 ENCOUNTER — Other Ambulatory Visit: Payer: Medicare Other

## 2015-12-17 ENCOUNTER — Encounter
Admission: RE | Admit: 2015-12-17 | Discharge: 2015-12-17 | Disposition: A | Discharge status: Home / Self Care | Payer: Medicare Other | Source: Ambulatory Visit | Attending: Orthopedic Surgery | Admitting: Orthopedic Surgery

## 2015-12-17 DIAGNOSIS — Z01812 Encounter for preprocedural laboratory examination: Secondary | ICD-10-CM | POA: Insufficient documentation

## 2015-12-17 HISTORY — DX: Reserved for inherently not codable concepts without codable children: IMO0001

## 2015-12-17 HISTORY — DX: Personal history of other specified conditions: Z87.898

## 2015-12-17 LAB — URINALYSIS COMPLETE WITH MICROSCOPIC (ARMC ONLY)
Bilirubin Urine: NEGATIVE
Glucose, UA: NEGATIVE mg/dL
HGB URINE DIPSTICK: NEGATIVE
Ketones, ur: NEGATIVE mg/dL
Nitrite: NEGATIVE
PH: 7 (ref 5.0–8.0)
Protein, ur: NEGATIVE mg/dL
SPECIFIC GRAVITY, URINE: 1.009 (ref 1.005–1.030)

## 2015-12-17 LAB — BASIC METABOLIC PANEL
ANION GAP: 9 (ref 5–15)
BUN: 19 mg/dL (ref 6–20)
CHLORIDE: 100 mmol/L — AB (ref 101–111)
CO2: 28 mmol/L (ref 22–32)
Calcium: 8.3 mg/dL — ABNORMAL LOW (ref 8.9–10.3)
Creatinine, Ser: 0.74 mg/dL (ref 0.44–1.00)
GFR calc non Af Amer: >60 mL/min (ref >60)
Glucose, Bld: 73 mg/dL (ref 65–99)
POTASSIUM: 3.4 mmol/L — AB (ref 3.5–5.1)
SODIUM: 137 mmol/L (ref 135–145)

## 2015-12-17 LAB — CBC
HCT: 36.2 % (ref 35.0–47.0)
HEMOGLOBIN: 12.4 g/dL (ref 12.0–16.0)
MCH: 29.9 pg (ref 26.0–34.0)
MCHC: 34.2 g/dL (ref 32.0–36.0)
MCV: 87.4 fL (ref 80.0–100.0)
Platelets: 332 10*3/uL (ref 150–440)
RBC: 4.14 MIL/uL (ref 3.80–5.20)
RDW: 13.9 % (ref 11.5–14.5)
WBC: 8.4 10*3/uL (ref 3.6–11.0)

## 2015-12-17 LAB — TYPE AND SCREEN
ABO/RH(D): O POS
ANTIBODY SCREEN: NEGATIVE

## 2015-12-17 LAB — SEDIMENTATION RATE: Sed Rate: 83 mm/hr — ABNORMAL HIGH (ref 0–30)

## 2015-12-17 LAB — APTT: aPTT: 34 seconds (ref 24–36)

## 2015-12-17 LAB — SURGICAL PCR SCREEN
MRSA, PCR: NEGATIVE
STAPHYLOCOCCUS AUREUS: NEGATIVE

## 2015-12-17 LAB — PROTIME-INR
INR: 1.05
PROTHROMBIN TIME: 13.9 s (ref 11.4–15.0)

## 2015-12-17 LAB — ABO/RH: ABO/RH(D): O POS

## 2015-12-17 NOTE — Pre-Procedure Instructions (Addendum)
[facility] COMPUTERS DOWN,FAXED TO DR Doy Hutching FOR CLEARANCE,EKG/STRESS RESULTS CLEARED BY DR Doy Hutching 11/26/15. EKG IN EPIC 8/16

## 2015-12-17 NOTE — Patient Instructions (Signed)
  Your procedure is scheduled on: 4/10/17Report to Day Surgery. To find out your arrival time please call [phone] between 1PM - 3PM on 01/02/16.  Remember: Instructions that are not followed completely may result in serious medical risk, up to and including death, or upon the discretion of your surgeon and anesthesiologist your surgery may need to be rescheduled.    _X_ 1. Do not eat food or drink liquids after midnight. No gum chewing or hard candies.     __X__ 2. No Alcohol for 24 hours before or after surgery.   ____ 3. Bring all medications with you on the day of surgery if instructed.    _X___ 4. Notify your doctor if there is any change in your medical condition     (cold, fever, infections).     Do not wear jewelry, make-up, hairpins, clips or nail polish.  Do not wear lotions, powders, or perfumes. You may wear deodorant.  Do not shave 48 hours prior to surgery. Men may shave face and neck.  Do not bring valuables to the hospital.    [facility] is not responsible for any belongings or valuables.               Contacts, dentures or bridgework may not be worn into surgery.  Leave your suitcase in the car. After surgery it may be brought to your room.  For patients admitted to the hospital, discharge time is determined by your                treatment team.   Patients discharged the day of surgery will not be allowed to drive home.   Please read over the following fact sheets that you were given:   Surgical Site Infection Prevention   _X___ Take these medicines the morning of surgery with A SIP OF WATER:    1.DULOXETINE  2. OMEPRAZOLE AM SURGERY AND AT BEDTIME NIGHT BEFORE SURGERY  3. CARVEDILOL  4. SYNTHROID  5.   6.  ____ Fleet Enema (as directed)   __X__ Use CHG Soap as directed  _X___ Use inhalers on the day of surgery  ____ Stop metformin 2 days prior to surgery    ____ Take 1/2 of usual insulin dose the night before surgery and none on the morning of  surgery.   ____ Stop Coumadin/Plavix/aspirin on   ____ Stop Anti-inflammatories on    ____ Stop supplements until after surgery.    ____ Bring C-Pap to the hospital.

## 2015-12-20 LAB — URINE CULTURE: SPECIAL REQUESTS: NORMAL

## 2015-12-22 NOTE — Pre-Procedure Instructions (Signed)
Urine culture results faxed to Dr. Hooten office. 

## 2015-12-26 ENCOUNTER — Ambulatory Visit: Payer: Medicare Other

## 2015-12-31 ENCOUNTER — Encounter: Payer: Self-pay | Admitting: Obstetrics and Gynecology

## 2015-12-31 ENCOUNTER — Ambulatory Visit (INDEPENDENT_AMBULATORY_CARE_PROVIDER_SITE_OTHER): Payer: Medicare Other | Admitting: Obstetrics and Gynecology

## 2015-12-31 VITALS — BP 108/69 | HR 75 | Ht 60.0 in | Wt 131.4 lb

## 2015-12-31 DIAGNOSIS — N8110 Cystocele, unspecified: Secondary | ICD-10-CM | POA: Diagnosis not present

## 2015-12-31 DIAGNOSIS — IMO0002 Reserved for concepts with insufficient information to code with codable children: Secondary | ICD-10-CM

## 2015-12-31 DIAGNOSIS — N390 Urinary tract infection, site not specified: Secondary | ICD-10-CM | POA: Diagnosis not present

## 2015-12-31 DIAGNOSIS — Z4689 Encounter for fitting and adjustment of other specified devices: Secondary | ICD-10-CM | POA: Diagnosis not present

## 2015-12-31 DIAGNOSIS — N816 Rectocele: Secondary | ICD-10-CM

## 2015-12-31 DIAGNOSIS — K469 Unspecified abdominal hernia without obstruction or gangrene: Secondary | ICD-10-CM | POA: Diagnosis not present

## 2015-12-31 LAB — POCT URINALYSIS DIPSTICK
BILIRUBIN UA: NEGATIVE
Glucose, UA: NEGATIVE
Ketones, UA: NEGATIVE
NITRITE UA: NEGATIVE
PH UA: 7.5
Protein, UA: NEGATIVE
Spec Grav, UA: <=1.005
Urobilinogen, UA: 0.2

## 2015-12-31 NOTE — Progress Notes (Signed)
Chief complaint: 1. Pessary maintenance 2. Pelvic organ prolapse  Patient presents for four-week interval pessary maintenance. She is scheduled for knee replacement surgery on Monday. She denies vaginal bleeding, vaginal discharge, odor, or pelvic pain. Trimosan gel as being inserted weekly.  Past medical history, past surgical history, problem list, medications, and allergies are reviewed  OBJECTIVE: BP 108/69 mmHg  Pulse 75  Ht 5' (1.524 m)  Wt 131 lb 6.4 oz (59.603 kg)  BMI 25.66 kg/m2 Pleasant, well-appearing female in no acute distress. Limited mobility Abdomen: Soft, nontender. Pelvic exam: External genitalia-atrophic. BUS-urethral caruncle present.. Vagina-third degree cystocele; moderate rectocele; vaginal enterocele. No evidence of discharge. Inflammation noted at the vaginal apex without bleeding Cervix-surgically absent Uterus-surgically absent. Bimanual-no palpable masses  PROCEDURE: Pessary is removed, cleaned, and reinserted  ASSESSMENT: 1. Pelvic organ prolapse with good response to gel 1 pessary 2. Normal pessary maintenance  PLAN: 1. Continue with Trimosan gel weekly 2. Return in 6 weeks for pessary maintenance following the surgery  A total of 15 minutes were spent face-to-face with the patient during this encounter and over half of that time dealt with counseling and coordination of care.  Brayton Mars, MD  Note: This dictation was prepared with Dragon dictation along with smaller phrase technology. Any transcriptional errors that result from this process are unintentional.

## 2015-12-31 NOTE — Patient Instructions (Signed)
1. Return in 6 weeks following the surgery for pessary maintenance 2. Continue using Trimosan gel weekly

## 2016-01-01 ENCOUNTER — Ambulatory Visit
Admission: RE | Admit: 2016-01-01 | Discharge: 2016-01-01 | Disposition: A | Discharge status: Home / Self Care | Payer: Medicare Other | Source: Ambulatory Visit | Attending: Oncology | Admitting: Oncology

## 2016-01-01 DIAGNOSIS — M1288 Other specific arthropathies, not elsewhere classified, other specified site: Secondary | ICD-10-CM | POA: Insufficient documentation

## 2016-01-01 DIAGNOSIS — R937 Abnormal findings on diagnostic imaging of other parts of musculoskeletal system: Secondary | ICD-10-CM | POA: Diagnosis not present

## 2016-01-01 DIAGNOSIS — I2510 Atherosclerotic heart disease of native coronary artery without angina pectoris: Secondary | ICD-10-CM | POA: Diagnosis not present

## 2016-01-01 DIAGNOSIS — C73 Malignant neoplasm of thyroid gland: Secondary | ICD-10-CM | POA: Diagnosis present

## 2016-01-01 DIAGNOSIS — R918 Other nonspecific abnormal finding of lung field: Secondary | ICD-10-CM | POA: Diagnosis not present

## 2016-01-01 DIAGNOSIS — C770 Secondary and unspecified malignant neoplasm of lymph nodes of head, face and neck: Secondary | ICD-10-CM | POA: Insufficient documentation

## 2016-01-01 DIAGNOSIS — R911 Solitary pulmonary nodule: Secondary | ICD-10-CM | POA: Diagnosis present

## 2016-01-01 DIAGNOSIS — I7090 Unspecified atherosclerosis: Secondary | ICD-10-CM | POA: Diagnosis not present

## 2016-01-01 LAB — GLUCOSE, CAPILLARY: Glucose-Capillary: 73 mg/dL (ref 65–99)

## 2016-01-01 MED ORDER — FLUDEOXYGLUCOSE F - 18 (FDG) INJECTION
12.9000 | Freq: Once | INTRAVENOUS | Status: AC | PRN
  Administered 2016-01-01: 12.9 via INTRAVENOUS

## 2016-01-02 ENCOUNTER — Telehealth: Payer: Self-pay | Admitting: *Deleted

## 2016-01-02 ENCOUNTER — Telehealth: Payer: Self-pay

## 2016-01-02 LAB — URINE CULTURE

## 2016-01-02 MED ORDER — NITROFURANTOIN MONOHYD MACRO 100 MG PO CAPS: 100.0000 mg | ORAL_CAPSULE | Freq: Two times a day (BID) | ORAL | Status: DC

## 2016-01-02 NOTE — Telephone Encounter (Signed)
Per Dr. Grayland Ormond, pt has abnormal PET and needs follow up when [place] is back from vacation. Called pt and informed her that her recent PET scan was abnormal and will require to be seen before July. An appointment will be arranged for patient and will be mailed to her per patient request. Pt verbalized understanding and stated would like to be seen first week of May due to knee replacement scheduled for 4/10. PET scan report has been faxed to Dr. Tami Ribas as well.

## 2016-01-02 NOTE — Telephone Encounter (Signed)
-----   Message from Brayton Mars, MD sent at 01/02/2016 11:34 AM EDT ----- Please notify - Abnormal Labs Macrobid twice daily for 7 days

## 2016-01-02 NOTE — Telephone Encounter (Signed)
Pt aware per vm- med erx to [facility].

## 2016-01-05 ENCOUNTER — Inpatient Hospital Stay: Payer: Medicare Other | Admitting: Anesthesiology

## 2016-01-05 ENCOUNTER — Encounter
Admission: RE | Disposition: A | Discharge status: Home Health | Payer: Self-pay | Source: Ambulatory Visit | Attending: Orthopedic Surgery

## 2016-01-05 ENCOUNTER — Inpatient Hospital Stay: Payer: Medicare Other

## 2016-01-05 ENCOUNTER — Encounter: Payer: Self-pay | Admitting: Orthopedic Surgery

## 2016-01-05 ENCOUNTER — Inpatient Hospital Stay
Admission: RE | Admit: 2016-01-05 | Discharge: 2016-01-07 | DRG: 470 | Disposition: A | Discharge status: Home Health | Payer: Medicare Other | Source: Ambulatory Visit | Attending: Orthopedic Surgery | Admitting: Orthopedic Surgery

## 2016-01-05 DIAGNOSIS — Z96642 Presence of left artificial hip joint: Secondary | ICD-10-CM | POA: Diagnosis present

## 2016-01-05 DIAGNOSIS — J45909 Unspecified asthma, uncomplicated: Secondary | ICD-10-CM | POA: Diagnosis present

## 2016-01-05 DIAGNOSIS — Z85850 Personal history of malignant neoplasm of thyroid: Secondary | ICD-10-CM

## 2016-01-05 DIAGNOSIS — I429 Cardiomyopathy, unspecified: Secondary | ICD-10-CM | POA: Diagnosis present

## 2016-01-05 DIAGNOSIS — Z79899 Other long term (current) drug therapy: Secondary | ICD-10-CM | POA: Diagnosis not present

## 2016-01-05 DIAGNOSIS — Z96659 Presence of unspecified artificial knee joint: Secondary | ICD-10-CM

## 2016-01-05 DIAGNOSIS — M25561 Pain in right knee: Secondary | ICD-10-CM | POA: Diagnosis present

## 2016-01-05 DIAGNOSIS — M1711 Unilateral primary osteoarthritis, right knee: Principal | ICD-10-CM | POA: Diagnosis present

## 2016-01-05 DIAGNOSIS — G629 Polyneuropathy, unspecified: Secondary | ICD-10-CM | POA: Diagnosis present

## 2016-01-05 DIAGNOSIS — E890 Postprocedural hypothyroidism: Secondary | ICD-10-CM | POA: Diagnosis present

## 2016-01-05 DIAGNOSIS — I9581 Postprocedural hypotension: Secondary | ICD-10-CM | POA: Diagnosis not present

## 2016-01-05 DIAGNOSIS — E876 Hypokalemia: Secondary | ICD-10-CM | POA: Diagnosis not present

## 2016-01-05 DIAGNOSIS — I10 Essential (primary) hypertension: Secondary | ICD-10-CM | POA: Diagnosis present

## 2016-01-05 HISTORY — PX: KNEE ARTHROPLASTY: SHX992

## 2016-01-05 LAB — TYPE AND SCREEN
ABO/RH(D): O POS
Antibody Screen: NEGATIVE

## 2016-01-05 SURGERY — ARTHROPLASTY, KNEE, TOTAL, USING IMAGELESS COMPUTER-ASSISTED NAVIGATION
Anesthesia: Spinal | Site: Knee | Laterality: Right | Wound class: Clean

## 2016-01-05 MED ORDER — MORPHINE SULFATE (PF) 2 MG/ML IV SOLN: 2.0000 mg | INTRAVENOUS | Status: DC | PRN

## 2016-01-05 MED ORDER — ACETAMINOPHEN 325 MG PO TABS: 650.0000 mg | ORAL_TABLET | Freq: Four times a day (QID) | ORAL | Status: DC | PRN

## 2016-01-05 MED ORDER — FENTANYL CITRATE (PF) 100 MCG/2ML IJ SOLN
INTRAMUSCULAR | Status: DC | PRN
  Administered 2016-01-05: 50 ug via INTRAVENOUS

## 2016-01-05 MED ORDER — ONDANSETRON HCL 4 MG/2ML IJ SOLN: 4.0000 mg | Freq: Four times a day (QID) | INTRAMUSCULAR | Status: DC | PRN

## 2016-01-05 MED ORDER — TETRACAINE HCL 1 % IJ SOLN
INTRAMUSCULAR | Status: DC | PRN
  Administered 2016-01-05 (×2): 3 mg via INTRASPINAL

## 2016-01-05 MED ORDER — ACETAMINOPHEN 650 MG RE SUPP: 650.0000 mg | Freq: Four times a day (QID) | RECTAL | Status: DC | PRN

## 2016-01-05 MED ORDER — SODIUM CHLORIDE 0.9 % IJ SOLN
INTRAMUSCULAR | Status: AC
  Filled 2016-01-05: qty 50

## 2016-01-05 MED ORDER — LACTATED RINGERS IV SOLN
INTRAVENOUS | Status: DC
  Administered 2016-01-05 (×4): via INTRAVENOUS

## 2016-01-05 MED ORDER — CLINDAMYCIN PHOSPHATE 900 MG/50ML IV SOLN
INTRAVENOUS | Status: AC
  Administered 2016-01-05: 900 mg via INTRAVENOUS
  Filled 2016-01-05: qty 50

## 2016-01-05 MED ORDER — GABAPENTIN 400 MG PO CAPS
800.0000 mg | ORAL_CAPSULE | Freq: Three times a day (TID) | ORAL | Status: DC
  Administered 2016-01-05 – 2016-01-07 (×6): 800 mg via ORAL
  Filled 2016-01-05 (×6): qty 2

## 2016-01-05 MED ORDER — BUPIVACAINE-EPINEPHRINE 0.25% -1:200000 IJ SOLN
INTRAMUSCULAR | Status: DC | PRN
  Administered 2016-01-05: 30 mL

## 2016-01-05 MED ORDER — ACETAMINOPHEN 10 MG/ML IV SOLN
INTRAVENOUS | Status: AC
  Filled 2016-01-05: qty 100

## 2016-01-05 MED ORDER — SODIUM CHLORIDE FLUSH 0.9 % IV SOLN
INTRAVENOUS | Status: AC
  Filled 2016-01-05: qty 3

## 2016-01-05 MED ORDER — NEOMYCIN-POLYMYXIN B GU 40-200000 IR SOLN
Status: AC
  Filled 2016-01-05: qty 20

## 2016-01-05 MED ORDER — MAGNESIUM HYDROXIDE 400 MG/5ML PO SUSP: 30.0000 mL | Freq: Every day | ORAL | Status: DC | PRN

## 2016-01-05 MED ORDER — CLINDAMYCIN PHOSPHATE 600 MG/50ML IV SOLN
600.0000 mg | Freq: Four times a day (QID) | INTRAVENOUS | Status: AC
  Administered 2016-01-05 – 2016-01-06 (×4): 600 mg via INTRAVENOUS
  Filled 2016-01-05 (×4): qty 50

## 2016-01-05 MED ORDER — CLINDAMYCIN PHOSPHATE 900 MG/50ML IV SOLN: 900.0000 mg | Freq: Once | INTRAVENOUS | Status: DC

## 2016-01-05 MED ORDER — FLEET ENEMA 7-19 GM/118ML RE ENEM: 1.0000 | ENEMA | Freq: Once | RECTAL | Status: DC | PRN

## 2016-01-05 MED ORDER — FENTANYL CITRATE (PF) 100 MCG/2ML IJ SOLN
INTRAMUSCULAR | Status: AC
  Administered 2016-01-05: 25 ug via INTRAVENOUS
  Filled 2016-01-05: qty 2

## 2016-01-05 MED ORDER — NEOMYCIN-POLYMYXIN B GU 40-200000 IR SOLN
Status: DC | PRN
Start: 2016-01-05 — End: 2016-01-05
  Administered 2016-01-05: 14 mL

## 2016-01-05 MED ORDER — ONDANSETRON HCL 4 MG/2ML IJ SOLN: 4.0000 mg | Freq: Once | INTRAMUSCULAR | Status: DC | PRN

## 2016-01-05 MED ORDER — ACETAMINOPHEN 10 MG/ML IV SOLN
INTRAVENOUS | Status: DC | PRN
  Administered 2016-01-05: 1000 mg via INTRAVENOUS

## 2016-01-05 MED ORDER — OXYCODONE HCL 5 MG PO TABS
5.0000 mg | ORAL_TABLET | ORAL | Status: DC | PRN
  Administered 2016-01-05 – 2016-01-07 (×4): 5 mg via ORAL
  Filled 2016-01-05 (×4): qty 1

## 2016-01-05 MED ORDER — DIPHENHYDRAMINE HCL 12.5 MG/5ML PO ELIX: 12.5000 mg | ORAL_SOLUTION | ORAL | Status: DC | PRN

## 2016-01-05 MED ORDER — FERROUS SULFATE 325 (65 FE) MG PO TABS
325.0000 mg | ORAL_TABLET | Freq: Two times a day (BID) | ORAL | Status: DC
  Administered 2016-01-05 – 2016-01-07 (×4): 325 mg via ORAL
  Filled 2016-01-05 (×4): qty 1

## 2016-01-05 MED ORDER — ALUM & MAG HYDROXIDE-SIMETH 200-200-20 MG/5ML PO SUSP: 30.0000 mL | ORAL | Status: DC | PRN

## 2016-01-05 MED ORDER — BUPIVACAINE LIPOSOME 1.3 % IJ SUSP
INTRAMUSCULAR | Status: AC
  Filled 2016-01-05: qty 20

## 2016-01-05 MED ORDER — PROPOFOL 500 MG/50ML IV EMUL
INTRAVENOUS | Status: DC | PRN
  Administered 2016-01-05: 30 ug/kg/min via INTRAVENOUS

## 2016-01-05 MED ORDER — PROPOFOL 10 MG/ML IV BOLUS
INTRAVENOUS | Status: DC | PRN
  Administered 2016-01-05: 20 mg via INTRAVENOUS

## 2016-01-05 MED ORDER — CARVEDILOL 3.125 MG PO TABS
3.1250 mg | ORAL_TABLET | Freq: Two times a day (BID) | ORAL | Status: DC
  Administered 2016-01-07: 3.125 mg via ORAL
  Filled 2016-01-05 (×3): qty 1

## 2016-01-05 MED ORDER — SODIUM CHLORIDE 0.9 % IV SOLN
INTRAVENOUS | Status: DC
  Administered 2016-01-05 – 2016-01-06 (×3): via INTRAVENOUS

## 2016-01-05 MED ORDER — ACETAMINOPHEN 10 MG/ML IV SOLN
1000.0000 mg | Freq: Four times a day (QID) | INTRAVENOUS | Status: AC
  Administered 2016-01-05 – 2016-01-06 (×4): 1000 mg via INTRAVENOUS
  Filled 2016-01-05 (×4): qty 100

## 2016-01-05 MED ORDER — MIDAZOLAM HCL 5 MG/5ML IJ SOLN
INTRAMUSCULAR | Status: DC | PRN
Start: 2016-01-05 — End: 2016-01-05
  Administered 2016-01-05 (×2): 1 mg via INTRAVENOUS

## 2016-01-05 MED ORDER — PANTOPRAZOLE SODIUM 40 MG PO TBEC
40.0000 mg | DELAYED_RELEASE_TABLET | Freq: Two times a day (BID) | ORAL | Status: DC
  Administered 2016-01-05 – 2016-01-07 (×4): 40 mg via ORAL
  Filled 2016-01-05 (×4): qty 1

## 2016-01-05 MED ORDER — METOCLOPRAMIDE HCL 10 MG PO TABS
10.0000 mg | ORAL_TABLET | Freq: Three times a day (TID) | ORAL | Status: AC
  Administered 2016-01-05 – 2016-01-07 (×8): 10 mg via ORAL
  Filled 2016-01-05 (×8): qty 1

## 2016-01-05 MED ORDER — OCUVITE-LUTEIN PO CAPS
1.0000 | ORAL_CAPSULE | Freq: Two times a day (BID) | ORAL | Status: DC
  Administered 2016-01-05 – 2016-01-07 (×4): 1 via ORAL
  Filled 2016-01-05 (×4): qty 1

## 2016-01-05 MED ORDER — SENNOSIDES-DOCUSATE SODIUM 8.6-50 MG PO TABS
1.0000 | ORAL_TABLET | Freq: Two times a day (BID) | ORAL | Status: DC
  Administered 2016-01-05 – 2016-01-07 (×4): 1 via ORAL
  Filled 2016-01-05 (×4): qty 1

## 2016-01-05 MED ORDER — BUPIVACAINE IN DEXTROSE 0.75-8.25 % IT SOLN
INTRATHECAL | Status: DC | PRN
  Administered 2016-01-05: 1.6 mL via INTRATHECAL

## 2016-01-05 MED ORDER — FENTANYL CITRATE (PF) 100 MCG/2ML IJ SOLN
25.0000 ug | INTRAMUSCULAR | Status: AC | PRN
  Administered 2016-01-05 (×6): 25 ug via INTRAVENOUS

## 2016-01-05 MED ORDER — PHENOL 1.4 % MT LIQD
1.0000 | OROMUCOSAL | Status: DC | PRN
  Filled 2016-01-05: qty 177

## 2016-01-05 MED ORDER — LORATADINE 10 MG PO TABS
10.0000 mg | ORAL_TABLET | Freq: Every day | ORAL | Status: DC
  Administered 2016-01-05 – 2016-01-07 (×3): 10 mg via ORAL
  Filled 2016-01-05 (×3): qty 1

## 2016-01-05 MED ORDER — SODIUM CHLORIDE 0.9 % IV SOLN
INTRAVENOUS | Status: DC | PRN
  Administered 2016-01-05: 60 mL

## 2016-01-05 MED ORDER — TRAMADOL HCL 50 MG PO TABS
50.0000 mg | ORAL_TABLET | ORAL | Status: DC | PRN
  Administered 2016-01-06 (×2): 50 mg via ORAL
  Filled 2016-01-05 (×2): qty 1

## 2016-01-05 MED ORDER — MOMETASONE FURO-FORMOTEROL FUM 200-5 MCG/ACT IN AERO
2.0000 | INHALATION_SPRAY | Freq: Two times a day (BID) | RESPIRATORY_TRACT | Status: DC
  Administered 2016-01-05 – 2016-01-07 (×4): 2 via RESPIRATORY_TRACT
  Filled 2016-01-05: qty 8.8

## 2016-01-05 MED ORDER — LEVOTHYROXINE SODIUM 100 MCG PO TABS
100.0000 ug | ORAL_TABLET | Freq: Every day | ORAL | Status: DC
  Administered 2016-01-06 – 2016-01-07 (×2): 100 ug via ORAL
  Filled 2016-01-05 (×2): qty 1

## 2016-01-05 MED ORDER — NYSTATIN 100000 UNIT/GM EX POWD: Freq: Two times a day (BID) | CUTANEOUS | Status: DC | PRN

## 2016-01-05 MED ORDER — NYSTATIN 100000 UNIT/GM EX POWD
Freq: Two times a day (BID) | CUTANEOUS | Status: DC | PRN
  Filled 2016-01-05: qty 15

## 2016-01-05 MED ORDER — BISACODYL 10 MG RE SUPP: 10.0000 mg | Freq: Every day | RECTAL | Status: DC | PRN

## 2016-01-05 MED ORDER — FENTANYL CITRATE (PF) 100 MCG/2ML IJ SOLN
INTRAMUSCULAR | Status: AC
Start: 2016-01-05 — End: 2016-01-05
  Administered 2016-01-05: 25 ug via INTRAVENOUS
  Filled 2016-01-05: qty 2

## 2016-01-05 MED ORDER — ENOXAPARIN SODIUM 30 MG/0.3ML ~~LOC~~ SOLN
30.0000 mg | Freq: Two times a day (BID) | SUBCUTANEOUS | Status: DC
  Administered 2016-01-06 – 2016-01-07 (×3): 30 mg via SUBCUTANEOUS
  Filled 2016-01-05 (×3): qty 0.3

## 2016-01-05 MED ORDER — DULOXETINE HCL 60 MG PO CPEP
60.0000 mg | ORAL_CAPSULE | Freq: Every day | ORAL | Status: DC
  Administered 2016-01-06 – 2016-01-07 (×2): 60 mg via ORAL
  Filled 2016-01-05 (×2): qty 1

## 2016-01-05 MED ORDER — FUROSEMIDE 40 MG PO TABS
40.0000 mg | ORAL_TABLET | Freq: Two times a day (BID) | ORAL | Status: DC
  Administered 2016-01-05 – 2016-01-07 (×4): 40 mg via ORAL
  Filled 2016-01-05 (×4): qty 1

## 2016-01-05 MED ORDER — FLUTICASONE PROPIONATE 50 MCG/ACT NA SUSP
2.0000 | Freq: Every day | NASAL | Status: DC
  Administered 2016-01-05 – 2016-01-07 (×3): 2 via NASAL
  Filled 2016-01-05: qty 16

## 2016-01-05 MED ORDER — MENTHOL 3 MG MT LOZG
1.0000 | LOZENGE | OROMUCOSAL | Status: DC | PRN
  Filled 2016-01-05: qty 9

## 2016-01-05 MED ORDER — ONDANSETRON HCL 4 MG PO TABS: 4.0000 mg | ORAL_TABLET | Freq: Four times a day (QID) | ORAL | Status: DC | PRN

## 2016-01-05 MED ORDER — BUPIVACAINE-EPINEPHRINE (PF) 0.25% -1:200000 IJ SOLN
INTRAMUSCULAR | Status: AC
  Filled 2016-01-05: qty 30

## 2016-01-05 SURGICAL SUPPLY — 56 items
AUTOTRANSFUS HAS 1/8 (MISCELLANEOUS) ×3
BATTERY INSTRU NAVIGATION (MISCELLANEOUS) ×12 IMPLANT
BLADE SAW 1 (BLADE) ×3 IMPLANT
BLADE SAW 1/2 (BLADE) ×3 IMPLANT
CANISTER SUCT 1200ML W/VALVE (MISCELLANEOUS) ×3 IMPLANT
CANISTER SUCT 3000ML (MISCELLANEOUS) ×6 IMPLANT
CAPT KNEE TOTAL 3 ATTUNE ×3 IMPLANT
CATH TRAY METER 16FR LF (MISCELLANEOUS) ×3 IMPLANT
CEMENT HV SMART SET (Cement) ×6 IMPLANT
COOLER POLAR GLACIER W/PUMP (MISCELLANEOUS) ×3 IMPLANT
DRAPE SHEET LG 3/4 BI-LAMINATE (DRAPES) ×3 IMPLANT
DRSG DERMACEA 8X12 NADH (GAUZE/BANDAGES/DRESSINGS) ×3 IMPLANT
DRSG OPSITE POSTOP 4X14 (GAUZE/BANDAGES/DRESSINGS) ×3 IMPLANT
DRSG TEGADERM 4X4.75 (GAUZE/BANDAGES/DRESSINGS) ×3 IMPLANT
DURAPREP 26ML APPLICATOR (WOUND CARE) ×6 IMPLANT
ELECT CAUTERY BLADE 6.4 (BLADE) ×3 IMPLANT
ELECT REM PT RETURN 9FT ADLT (ELECTROSURGICAL) ×3
ELECTRODE REM PT RTRN 9FT ADLT (ELECTROSURGICAL) ×1 IMPLANT
EX-PIN ORTHOLOCK NAV 4X150 (PIN) ×6 IMPLANT
GLOVE BIOGEL M STRL SZ7.5 (GLOVE) ×6 IMPLANT
GLOVE INDICATOR 8.0 STRL GRN (GLOVE) ×3 IMPLANT
GLOVE SURG 9.0 ORTHO LTXF (GLOVE) ×3 IMPLANT
GLOVE SURG ORTHO 9.0 STRL STRW (GLOVE) ×3 IMPLANT
GOWN STRL REUS W/ TWL LRG LVL3 (GOWN DISPOSABLE) ×2 IMPLANT
GOWN STRL REUS W/TWL 2XL LVL3 (GOWN DISPOSABLE) ×3 IMPLANT
GOWN STRL REUS W/TWL LRG LVL3 (GOWN DISPOSABLE) ×4
HANDPIECE SUCTION TUBG SURGILV (MISCELLANEOUS) ×3 IMPLANT
HOLDER FOLEY CATH W/STRAP (MISCELLANEOUS) ×3 IMPLANT
HOOD PEEL AWAY FLYTE STAYCOOL (MISCELLANEOUS) ×6 IMPLANT
KIT RM TURNOVER STRD PROC AR (KITS) ×3 IMPLANT
KNIFE SCULPS 14X20 (INSTRUMENTS) ×3 IMPLANT
NDL SAFETY 18GX1.5 (NEEDLE) ×3 IMPLANT
NEEDLE SPNL 20GX3.5 QUINCKE YW (NEEDLE) ×3 IMPLANT
NS IRRIG 500ML POUR BTL (IV SOLUTION) ×3 IMPLANT
PACK TOTAL KNEE (MISCELLANEOUS) ×3 IMPLANT
PAD WRAPON POLAR KNEE (MISCELLANEOUS) ×1 IMPLANT
PIN DRILL QUICK PACK ×3 IMPLANT
PIN FIXATION 1/8DIA X 3INL (PIN) ×3 IMPLANT
SOL .9 NS 3000ML IRR  AL (IV SOLUTION) ×2
SOL .9 NS 3000ML IRR UROMATIC (IV SOLUTION) ×1 IMPLANT
SOL PREP PVP 2OZ (MISCELLANEOUS) ×3
SOLUTION PREP PVP 2OZ (MISCELLANEOUS) ×1 IMPLANT
SPONGE DRAIN TRACH 4X4 STRL 2S (GAUZE/BANDAGES/DRESSINGS) ×3 IMPLANT
STAPLER SKIN PROX 35W (STAPLE) ×3 IMPLANT
SUCTION FRAZIER HANDLE 10FR (MISCELLANEOUS) ×2
SUCTION TUBE FRAZIER 10FR DISP (MISCELLANEOUS) ×1 IMPLANT
SUT VIC AB 0 CT1 36 (SUTURE) ×3 IMPLANT
SUT VIC AB 1 CT1 36 (SUTURE) ×6 IMPLANT
SUT VIC AB 2-0 CT2 27 (SUTURE) ×3 IMPLANT
SYR 20CC LL (SYRINGE) ×3 IMPLANT
SYR 30ML LL (SYRINGE) ×3 IMPLANT
SYR 50ML LL SCALE MARK (SYRINGE) ×3 IMPLANT
SYSTEM AUTOTRANSFUS DUAL TROCR (MISCELLANEOUS) ×1 IMPLANT
TOWEL OR 17X26 4PK STRL BLUE (TOWEL DISPOSABLE) ×3 IMPLANT
TOWER CARTRIDGE SMART MIX (DISPOSABLE) ×3 IMPLANT
WRAPON POLAR PAD KNEE (MISCELLANEOUS) ×3

## 2016-01-05 NOTE — Evaluation (Signed)
Physical Therapy Evaluation Patient Details Name: Laura Walters MRN: [id number] DOB: [birth year] Today's Date: 01/05/2016   History of Present Illness  Patient is an 82 y/o female that presents for R TKR on 01/05/2016  Clinical Impression  Patient is seen on POD#0 after several bouts of outpatient rehab to address R knee pain. She had been progressively declining in mobility, however she reports she will absolutely be returning home after this admission. She tolerates all mobility well, and really does not require any physical assistance beyond a RW. She demonstrates excellent RLE strength, no quad lag noted in this session. She takes several short steps with RW and appropriate mechanics, with good weight acceptance on RLE. She appears to be progressing well towards her established mobility goals.     Follow Up Recommendations Home health PT    Equipment Recommendations  Rolling walker with 5" wheels    Recommendations for Other Services       Precautions / Restrictions Precautions Precautions: Fall Restrictions Weight Bearing Restrictions: Yes RLE Weight Bearing: Weight bearing as tolerated      Mobility  Bed Mobility Overal bed mobility: Needs Assistance Bed Mobility: Supine to Sit     Supine to sit: Supervision     General bed mobility comments: Patient educated in hooking method to bring RLE over the threshold of the EOB, minor loss of balance posteriorly during, however she was able to recover spontaneously.   Transfers Overall transfer level: Needs assistance Equipment used: Rolling walker (2 wheeled) Transfers: Sit to/from Stand Sit to Stand: Min guard         General transfer comment: Patient educated on hand placement, sequencing with transfer, no loss of balance and only balance guarding provided by PT during transfer.   Ambulation/Gait Ambulation/Gait assistance: Supervision Ambulation Distance (Feet): 4 Feet Assistive device: Rolling walker (2  wheeled) Gait Pattern/deviations: Step-to pattern   Gait velocity interpretation: Below normal speed for age/gender General Gait Details: Patient takes short step to pattern, no buckling or loss of balance. Good weight acceptance noted on RLE.   Stairs            Wheelchair Mobility    Modified Rankin (Stroke Patients Only)       Balance Overall balance assessment: Needs assistance Sitting-balance support: No upper extremity supported Sitting balance-Leahy Scale: Good     Standing balance support: Bilateral upper extremity supported Standing balance-Leahy Scale: Good                               Pertinent Vitals/Pain Pain Assessment:  (Patient reports at the end of the session her pain was "fine")    Home Living Family/patient expects to be discharged to:: Private residence Living Arrangements: Children;Other relatives Available Help at Discharge: Family Type of Home: House Home Access: Stairs to enter Entrance Stairs-Rails: Can reach both Entrance Stairs-Number of Steps: 3 Home Layout: One level Home Equipment: [place] - 4 wheels;Cane - single point      Prior Function Level of Independence: Independent with assistive device(s)         Comments: Patient reports she was ambulating progressively shorter distances with SPC/4WW secondary to pain. She would use scooter or wheelchair while out in the town.      Hand Dominance        Extremity/Trunk Assessment   Upper Extremity Assessment: Overall WFL for tasks assessed           Lower Extremity Assessment:  Overall WFL for tasks assessed;RLE deficits/detail RLE Deficits / Details: Able to complete 10 SLRs, 10 LAQs without assistance. She reports sensation was returned as well.        Communication   Communication: No difficulties  Cognition Arousal/Alertness: Awake/alert Behavior During Therapy: WFL for tasks assessed/performed Overall Cognitive Status: Within Functional Limits for  tasks assessed                      General Comments General comments (skin integrity, edema, etc.): Drains in tact and no drainage noted aside from in drains.     Exercises Total Joint Exercises Ankle Circles/Pumps: AROM;Both;10 reps Heel Slides: AROM;Both;10 reps Hip ABduction/ADduction: AROM;Both;10 reps Straight Leg Raises: AROM;Both;10 reps Long Arc Quad: AROM;Both;10 reps Goniometric ROM: 0-57      Assessment/Plan    PT Assessment Patient needs continued PT services  PT Diagnosis Difficulty walking;Generalized weakness   PT Problem List Decreased strength;Decreased range of motion;Decreased knowledge of use of DME;Decreased activity tolerance;Decreased balance;Decreased mobility  PT Treatment Interventions DME instruction;Gait training;Stair training;Therapeutic activities;Therapeutic exercise;Balance training;Manual techniques   PT Goals (Current goals can be found in the Care Plan section) Acute Rehab PT Goals Patient Stated Goal: To return home  PT Goal Formulation: With patient Time For Goal Achievement: 01/19/16 Potential to Achieve Goals: Good    Frequency BID   Barriers to discharge        Co-evaluation               End of Session Equipment Utilized During Treatment: Gait belt Activity Tolerance: Patient tolerated treatment well Patient left: in chair;with call bell/phone within reach;with chair alarm set;with family/visitor present Nurse Communication: Mobility status         Time: [id number] PT Time Calculation (min) (ACUTE ONLY): 26 min   Charges:   PT Evaluation $PT Eval Moderate Complexity: 1 Procedure PT Treatments $Therapeutic Exercise: 8-22 mins   PT G Codes:       Kerman Passey, PT, DPT    01/05/2016, 6:13 PM

## 2016-01-05 NOTE — Brief Op Note (Signed)
01/05/2016  10:45 AM  PATIENT:  Stanton Kidney A Mazzaferro  82 y.o. female  PRE-OPERATIVE DIAGNOSIS:  primary osteoarthritis of the right knee  POST-OPERATIVE DIAGNOSIS:  Same  PROCEDURE:  Procedure(s): COMPUTER ASSISTED TOTAL KNEE ARTHROPLASTY (Right)  SURGEON:  Surgeon(s) and Role:    * Dereck Leep, MD - Primary  ASSISTANTS: Vance Peper, PA   ANESTHESIA:   spinal  EBL:  Total I/O In: 1200 [I.V.:1200] Out: 400 [Urine:300; Blood:100]  BLOOD ADMINISTERED:none  DRAINS: 2 medium drains to a reinfusion system   LOCAL MEDICATIONS USED:  MARCAINE    and OTHER Exparel  SPECIMEN:  No Specimen  DISPOSITION OF SPECIMEN:  N/A  COUNTS:  YES  TOURNIQUET:   97 minutes  DICTATION: .Dragon Dictation  PLAN OF CARE: Admit to inpatient   PATIENT DISPOSITION:  PACU - hemodynamically stable.   Delay start of Pharmacological VTE agent (>24hrs) due to surgical blood loss or risk of bleeding: yes

## 2016-01-05 NOTE — H&P (Signed)
The patient has been re-examined, and the chart reviewed, and there have been no interval changes to the documented history and physical.    The risks, benefits, and alternatives have been discussed at length. The patient expressed understanding of the risks benefits and agreed with plans for surgical intervention.  James P. Hooten, Jr. M.D.    

## 2016-01-05 NOTE — Progress Notes (Signed)
Patient B/P at 1700 100/64. Doctor [employer] notified he at 1710, verbal order given to hold carvedilol tonight.

## 2016-01-05 NOTE — Anesthesia Postprocedure Evaluation (Signed)
Anesthesia Post Note  Patient: Laura Walters  Procedure(s) Performed: Procedure(s) (LRB): COMPUTER ASSISTED TOTAL KNEE ARTHROPLASTY (Right)  Patient location during evaluation: PACU Anesthesia Type: General Level of consciousness: awake and alert Pain management: pain level controlled Vital Signs Assessment: post-procedure vital signs reviewed and stable Respiratory status: spontaneous breathing, nonlabored ventilation, respiratory function stable and patient connected to nasal cannula oxygen Cardiovascular status: blood pressure returned to baseline and stable Postop Assessment: no signs of nausea or vomiting Anesthetic complications: no    Last Vitals:  Filed Vitals:   01/05/16 1827 01/05/16 1931  BP: 123/60 117/52  Pulse: 59 66  Temp: 36.6 C 36.4 C  Resp: 18 16    Last Pain:  Filed Vitals:   01/05/16 1931  PainSc: 6     LLE Motor Response: Purposeful movement (01/05/16 1928) LLE Sensation: Full sensation (01/05/16 1928) RLE Motor Response: Purposeful movement (01/05/16 1928) RLE Sensation: Full sensation (01/05/16 1928)      Gunnar Bulla S

## 2016-01-05 NOTE — Anesthesia Preprocedure Evaluation (Addendum)
Anesthesia Evaluation  Patient identified by MRN, date of birth, ID band Patient awake    Reviewed: Allergy & Precautions, NPO status , Patient's Chart, lab work & pertinent test results, reviewed documented beta blocker date and time   Airway Mallampati: II  TM Distance: >3 FB     Dental  (+) Chipped, Upper Dentures, Partial Lower   Pulmonary           Cardiovascular hypertension, Pt. on medications      Neuro/Psych  Neuromuscular disease    GI/Hepatic   Endo/Other  Hypothyroidism   Renal/GU      Musculoskeletal  (+) Arthritis ,   Abdominal   Peds  Hematology   Anesthesia Other Findings   Reproductive/Obstetrics                           Anesthesia Physical Anesthesia Plan  ASA: III  Anesthesia Plan: Spinal   Post-op Pain Management:    Induction:   Airway Management Planned:   Additional Equipment:   Intra-op Plan:   Post-operative Plan:   Informed Consent: I have reviewed the patients History and Physical, chart, labs and discussed the procedure including the risks, benefits and alternatives for the proposed anesthesia with the patient or authorized representative who has indicated his/her understanding and acceptance.     Plan Discussed with: CRNA  Anesthesia Plan Comments:         Anesthesia Quick Evaluation

## 2016-01-05 NOTE — Care Management Note (Addendum)
Case Management Note  Patient Details  Name: Laura Walters MRN: [id number] Date of Birth: 06/03/1933  Subjective/Objective:                  Met with patient to discuss discharge planning. She declined SNF; PT is pending .She states that she has someone scheduled to be with her morning and night. She has a rolling walker available to use. She has been to [facility] in the past but has no interest in going again. She has used [facility] in the past but would like to use [facility] this time. She uses [facility] for Rx  [phone].  Action/Plan: Lovenox 37m #14 called in to [facility]. List of home health agencies left with patient. [facility] will [facility] to follow.   Expected Discharge Date:                  Expected Discharge Plan:     In-House Referral:     Discharge planning Services  CM Consult  Post Acute Care Choice:  Home Health Choice offered to:  Patient  DME Arranged:    DME Agency:     HH Arranged:  PT HH Agency:  [facility] Status of Service:  In process, will continue to follow  Medicare Important Message Given:    Date Medicare IM Given:    Medicare IM give by:    Date Additional Medicare IM Given:    Additional Medicare Important Message give by:     If [place], dates discussed:    Additional Comments: Lovenox $10.00  AMarshell Garfinkel RN 01/05/2016, 2:09 PM

## 2016-01-05 NOTE — Transfer of Care (Signed)
Immediate Anesthesia Transfer of Care Note  Patient: Laura Walters  Procedure(s) Performed: Procedure(s): COMPUTER ASSISTED TOTAL KNEE ARTHROPLASTY (Right)  Patient Location: PACU  Anesthesia Type:Spinal  Level of Consciousness: awake and alert   Airway & Oxygen Therapy: Patient Spontanous Breathing and Patient connected to face mask oxygen  Post-op Assessment: Report given to RN  Post vital signs: Reviewed and stable  Last Vitals:  Filed Vitals:   01/05/16 0619 01/05/16 1045  BP: 142/65 125/64  Pulse: 67 64  Temp: 36.7 C 36 C  Resp: 18 8    Complications: No apparent anesthesia complications

## 2016-01-05 NOTE — Anesthesia Procedure Notes (Addendum)
Performed by: [id number], DAVID Oxygen Delivery Method: Simple face mask   Spinal Patient location during procedure: OR Staffing Anesthesiologist: Gunnar Bulla Performed by: anesthesiologist  Preanesthetic Checklist Completed: patient identified, site marked, surgical consent, pre-op evaluation, timeout performed, IV checked and risks and benefits discussed Spinal Block Patient position: sitting Prep: Betadine Patient monitoring: heart rate, cardiac monitor, continuous pulse ox and blood pressure Approach: midline Location: L3-4 Injection technique: single-shot Needle Needle type: Pencil-Tip  Needle gauge: 25 G Needle length: 9 cm Assessment Sensory level: T10 Additional Notes Marcaine 12 mg and tetracaine 3 mg.

## 2016-01-05 NOTE — Op Note (Signed)
OPERATIVE NOTE  DATE OF SURGERY:  01/05/2016  PATIENT NAME:  Laura Walters   DOB: 1933/04/17  MRN: [id number]  PRE-OPERATIVE DIAGNOSIS: Degenerative arthrosis of the right knee, primary  POST-OPERATIVE DIAGNOSIS:  Same  PROCEDURE:  Right total knee arthroplasty using computer-assisted navigation  SURGEON:  Marciano Sequin. M.D.  ASSISTANT:  Vance Peper, PA (present and scrubbed throughout the case, critical for assistance with exposure, retraction, instrumentation, and closure)  ANESTHESIA: spinal  ESTIMATED BLOOD LOSS: 100 mL  FLUIDS REPLACED: 1200 mL of crystalloid  TOURNIQUET TIME: 97 minutes  DRAINS: 2 medium drains to a reinfusion system  SOFT TISSUE RELEASES: Anterior cruciate ligament, posterior cruciate ligament, deep medial collateral ligament, patellofemoral ligament   IMPLANTS UTILIZED: DePuy Attune size 5 posterior stabilized femoral component (cemented), size 4 rotating platform tibial component (cemented), 35 mm medialized dome patella (cemented), and a 6 mm stabilized rotating platform polyethylene insert.  INDICATIONS FOR SURGERY: Laura Walters is a 82 y.o. year old female with a long history of progressive knee pain. X-rays demonstrated severe degenerative changes in tricompartmental fashion. The patient had not seen any significant improvement despite conservative nonsurgical intervention. After discussion of the risks and benefits of surgical intervention, the patient expressed understanding of the risks benefits and agree with plans for total knee arthroplasty.   The risks, benefits, and alternatives were discussed at length including but not limited to the risks of infection, bleeding, nerve injury, stiffness, blood clots, the need for revision surgery, cardiopulmonary complications, among others, and they were willing to proceed.  PROCEDURE IN DETAIL: The patient was brought into the operating room and, after adequate spinal anesthesia was achieved, a tourniquet  was placed on the patient's upper thigh. The patient's knee and leg were cleaned and prepped with alcohol and DuraPrep and draped in the usual sterile fashion. A "timeout" was performed as per usual protocol. The lower extremity was exsanguinated using an Esmarch, and the tourniquet was inflated to 300 mmHg. An anterior longitudinal incision was made followed by a standard mid vastus approach. The deep fibers of the medial collateral ligament were elevated in a subperiosteal fashion off of the medial flare of the tibia so as to maintain a continuous soft tissue sleeve. The patella was subluxed laterally and the patellofemoral ligament was incised. Inspection of the knee demonstrated severe degenerative changes with full-thickness loss of articular cartilage. There was thick, fibrotic scar tissue throughout the medial and lateral gutters and suprapatellar pouch. An extensive synovectomy was performed. Osteophytes were debrided using a rongeur. Anterior and posterior cruciate ligaments were excised. Two 4.0 mm Schanz pins were inserted in the femur and into the tibia for attachment of the array of trackers used for computer-assisted navigation. Hip center was identified using a circumduction technique. Distal landmarks were mapped using the computer. The distal femur and proximal tibia were mapped using the computer. The distal femoral cutting guide was positioned using computer-assisted navigation so as to achieve a 5 distal valgus cut. The femur was sized and it was felt that a size 5 femoral component was appropriate. A size 5 femoral cutting guide was positioned and the anterior cut was performed and verified using the computer. This was followed by completion of the posterior and chamfer cuts. Femoral cutting guide for the central box was then positioned in the center box cut was performed.  Attention was then directed to the proximal tibia. Medial and lateral menisci were excised. The extramedullary tibial  cutting guide was positioned using computer-assisted navigation  so as to achieve a 0 varus-valgus alignment and 3 posterior slope. The cut was performed and verified using the computer. The proximal tibia was sized and it was felt that a size 4 tibial tray was appropriate. Tibial and femoral trials were inserted followed by insertion of a 5 and subsequently a 6 mm polyethylene insert. This allowed for excellent mediolateral soft tissue balancing both in flexion and in full extension. Finally, the patella was cut and prepared so as to accommodate a 35 mm medialized dome patella. A patella trial was placed and the knee was placed through a range of motion with excellent patellar tracking appreciated. The femoral trial was removed after debridement of posterior osteophytes. The central post-hole for the tibial component was reamed followed by insertion of a keel punch. Tibial trials were then removed. Cut surfaces of bone were irrigated with copious amounts of normal saline with antibiotic solution using pulsatile lavage and then suctioned dry. Polymethylmethacrylate cement was prepared in the usual fashion using a vacuum mixer. Cement was applied to the cut surface of the proximal tibia as well as along the undersurface of a size 4 rotating platform tibial component. Tibial component was positioned and impacted into place. Excess cement was removed using [employer] fast streamer. Cement was then applied to the cut surfaces of the femur as well as along the posterior flanges of the size 5 femoral component. The femoral component was positioned and impacted into place. Excess cement was removed using [employer] fast streamer. A 6 mm polyethylene trial was inserted and the knee was brought into full extension with steady axial compression applied. Finally, cement was applied to the backside of a 35 mm medialized dome patella and the patellar component was positioned and patellar clamp applied. Excess cement was removed using Air cabin crew. After adequate curing of the cement, the tourniquet was deflated after a total tourniquet time of 97 minutes. Hemostasis was achieved using electrocautery. The knee was irrigated with copious amounts of normal saline with antibiotic solution using pulsatile lavage and then suctioned dry. 20 mL of 1.3% Exparel in 40 mL of normal saline was injected along the posterior capsule, medial and lateral gutters, and along the arthrotomy site. A 6 mm stabilized rotating platform polyethylene insert was inserted and the knee was placed through a range of motion with excellent mediolateral soft tissue balancing appreciated and excellent patellar tracking noted. 2 medium drains were placed in the wound bed and brought out through separate stab incisions to be attached to a reinfusion system. The medial parapatellar portion of the incision was reapproximated using interrupted sutures of #1 Vicryl. Subcutaneous tissue was then injected with a total of 30 cc of 0.25% Marcaine with epinephrine. Subcutaneous tissue was approximated in layers using first #0 Vicryl followed #2-0 Vicryl. The skin was approximated with skin staples. A sterile dressing was applied.  The patient tolerated the procedure well and was transported to the recovery room in stable condition.    Fran Neiswonger P. Holley Bouche., M.D.

## 2016-01-06 LAB — BASIC METABOLIC PANEL
ANION GAP: 7 (ref 5–15)
BUN: 15 mg/dL (ref 6–20)
CALCIUM: 7 mg/dL — AB (ref 8.9–10.3)
CHLORIDE: 101 mmol/L (ref 101–111)
CO2: 28 mmol/L (ref 22–32)
Creatinine, Ser: 0.85 mg/dL (ref 0.44–1.00)
GFR calc non Af Amer: >60 mL/min (ref >60)
Glucose, Bld: 110 mg/dL — ABNORMAL HIGH (ref 65–99)
Potassium: 3.3 mmol/L — ABNORMAL LOW (ref 3.5–5.1)
Sodium: 136 mmol/L (ref 135–145)

## 2016-01-06 LAB — CBC
HEMATOCRIT: 25.8 % — AB (ref 35.0–47.0)
HEMOGLOBIN: 8.9 g/dL — AB (ref 12.0–16.0)
MCH: 30.3 pg (ref 26.0–34.0)
MCHC: 34.4 g/dL (ref 32.0–36.0)
MCV: 88.1 fL (ref 80.0–100.0)
Platelets: 217 10*3/uL (ref 150–440)
RBC: 2.93 MIL/uL — ABNORMAL LOW (ref 3.80–5.20)
RDW: 14.1 % (ref 11.5–14.5)
WBC: 8 10*3/uL (ref 3.6–11.0)

## 2016-01-06 MED ORDER — OXYCODONE HCL 5 MG PO TABS: 5.0000 mg | ORAL_TABLET | ORAL | Status: DC | PRN

## 2016-01-06 MED ORDER — ENOXAPARIN SODIUM 40 MG/0.4ML ~~LOC~~ SOLN: 40.0000 mg | SUBCUTANEOUS | Status: DC

## 2016-01-06 MED ORDER — POTASSIUM CHLORIDE 20 MEQ PO PACK
20.0000 meq | PACK | Freq: Three times a day (TID) | ORAL | Status: AC
  Administered 2016-01-06 (×3): 20 meq via ORAL
  Filled 2016-01-06 (×3): qty 1

## 2016-01-06 MED ORDER — TRAMADOL HCL 50 MG PO TABS: 50.0000 mg | ORAL_TABLET | ORAL | Status: DC | PRN

## 2016-01-06 NOTE — Progress Notes (Signed)
Subjective: 1 Day Post-Op Procedure(s) (LRB): COMPUTER ASSISTED TOTAL KNEE ARTHROPLASTY (Right) Patient reports pain as 2 on 0-10 scale.   Patient is well, and has had no acute complaints or problems We will start therapy today.  Plan is to go Home after hospital stay. no nausea and no vomiting Patient denies any chest pains or shortness of breath. Patient slept very well during the night. Has no complaints.  Objective: Vital signs in last 24 hours: Temp:  [97.2 F (36.2 C)-99.4 F (37.4 C)] 99.4 F (37.4 C) (04/11 0350) Pulse Rate:  [57-80] 80 (04/11 0350) Resp:  [8-19] 17 (04/11 0350) BP: (100-140)/(50-76) 104/59 mmHg (04/11 0604) SpO2:  [94 %-100 %] 94 % (04/11 0350) Weight:  [59.603 kg (131 lb 6.4 oz)] 59.603 kg (131 lb 6.4 oz) (04/10 1220) Heels are non tender and elevated off the bed using rolled towels Intake/Output from previous day: 04/10 0701 - 04/11 0700 In: 3335 [P.O.:240; I.V.:2645; IV Piggyback:450] Out: 3885 [Urine:3400; Drains:385; Blood:100] Intake/Output this shift:     Recent Labs  01/06/16 0439  HGB 8.9*    Recent Labs  01/06/16 0439  WBC 8.0  RBC 2.93*  HCT 25.8*  PLT 217    Recent Labs  01/06/16 0439  NA 136  K 3.3*  CL 101  CO2 28  BUN 15  CREATININE 0.85  GLUCOSE 110*  CALCIUM 7.0*   No results for input(s): LABPT, INR in the last 72 hours.  EXAM General - Patient is Alert, Appropriate and Oriented Extremity - Neurologically intact Neurovascular intact Sensation intact distally Intact pulses distally Dorsiflexion/Plantar flexion intact Dressing - dressing C/D/I Motor Function - intact, moving foot and toes well on exam. Moving right leg very well. Bending the knee and do a straight leg raises on her own.  Past Medical History  Diagnosis Date  . Arthritis   . Cardiomyopathy ([place])     idiopathic  . Hypertension   . Neuropathy ([place])   . DDD (degenerative disc disease), lumbar   . RAD (reactive airway disease)   .  Chest pain     non cardiac  . Cancer of thyroid ([place]) 03/30/2015  . Urge incontinence   . IBS (irritable bowel syndrome)   . Seasonal allergies   . Hypothyroidism   . Nocturia   . Vaginal polyp   . Low back pain   . Cystocele   . Rectocele   . Pelvic pain in female   . Vaginal atrophy   . Vulvitis   . Constipation   . H/O wheezing     advair as needed  . Chokes     easily   swallow test negative    Assessment/Plan: 1 Day Post-Op Procedure(s) (LRB): COMPUTER ASSISTED TOTAL KNEE ARTHROPLASTY (Right) Active Problems:   S/P total knee arthroplasty  Estimated body mass index is 25.66 kg/(m^2) as calculated from the following:   Height as of this encounter: 5' (1.524 m).   Weight as of this encounter: 59.603 kg (131 lb 6.4 oz). Up with therapy D/C IV fluids Plan for discharge tomorrow Discharge home with home health  Labs: Were reviewed. Patient slightly hypokalemia DVT Prophylaxis - Lovenox, Foot Pumps and TED hose Weight-Bearing as tolerated to right leg D/C O2 and Pulse OX and try on Room Air Patient has already have a bowel movement but continue to work on having additional ones secondary to being on narcotics. Labs in a.m.  Jillyn Ledger. [place] 01/06/2016, 7:18 AM

## 2016-01-06 NOTE — Evaluation (Signed)
Occupational Therapy Evaluation Patient Details Name: Laura Walters MRN: [id number] DOB: 08/05/1933 Today's Date: 01/06/2016    History of Present Illness Patient is an 82 y/o female that presents for R TKR on 01/05/2016   Clinical Impression   Pt is 82 year old female s/p Right TKR.  Pt was independent in all ADLs prior to surgery and is eager to return to her PLOF.  Pt currently requires minimal assist for LB dressing while in a seated position due to pain and limited AROM of Right knee. Pt. Is able to stand with RW with [employer] in preparation for hiking clothing. Pt would benefit from instruction in dressing techniques with or without assistive devices for ADLs.  Pt would also benefit from recommendations for home modifications to increase safety in the bathroom and prevent falls.     Follow Up Recommendations  No OT follow up    Equipment Recommendations       Recommendations for Other Services PT consult     Precautions / Restrictions Precautions Precautions: Fall Restrictions Weight Bearing Restrictions: Yes RLE Weight Bearing: Weight bearing as tolerated      Mobility Bed Mobility               General bed mobility comments: Pt. up in chair upon arrival  Transfers Overall transfer level: Needs assistance Equipment used: Rolling walker (2 wheeled) Transfers: Sit to/from Stand Sit to Stand: Min guard              Balance Overall balance assessment: Needs assistance   Sitting balance-Leahy Scale: Good       Standing balance-Leahy Scale: Good Standing balance comment: with walker                            ADL Overall ADL's : Needs assistance/impaired;Independent     Grooming: Independent           Upper Body Dressing : Independent (gown management)   Lower Body Dressing: Minimal assistance;With adaptive equipment                       Vision     Perception     Praxis      Pertinent Vitals/Pain Pain  Assessment: 0-10 Pain Score: 6      Hand Dominance Right   Extremity/Trunk Assessment     Lower Extremity Assessment Lower Extremity Assessment: Overall WFL for tasks assessed       Communication Communication Communication: No difficulties   Cognition Arousal/Alertness: Awake/alert Behavior During Therapy: WFL for tasks assessed/performed Overall Cognitive Status: Within Functional Limits for tasks assessed                     General Comments       Exercises       Shoulder Instructions      Home Living Family/patient expects to be discharged to:: Private residence Living Arrangements: Children;Other relatives Available Help at Discharge: Family Type of Home: House Home Access: Stairs to enter [employer] of Steps: 3 Entrance Stairs-Rails: Can reach both Home Layout: One level     Bathroom Shower/Tub: Walk-in shower;Door   [employer] Toilet: Standard Bathroom Accessibility: Yes How Accessible: Accessible via walker Home Equipment: Walker - 4 wheels;Cane - quad;Tub bench;Bedside commode          Prior Functioning/Environment Level of Independence: Independent with assistive device(s)        Comments: Pt.has not driven  a long time.    OT Diagnosis: Generalized weakness   OT Problem List: Decreased activity tolerance;Decreased knowledge of use of DME or AE;Decreased strength   OT Treatment/Interventions: Self-care/ADL training    OT Goals(Current goals can be found in the care plan section) Acute Rehab OT Goals Patient Stated Goal: To return home  OT Frequency: Min 1X/week   Barriers to D/C:            Co-evaluation              End of Session Equipment Utilized During Treatment: Gait belt;Rolling walker  Activity Tolerance: Patient tolerated treatment well Patient left: in chair;with call bell/phone within reach;with chair alarm set   Time: 1105-1135 OT Time Calculation (min): 30 min Charges:  OT General  Charges $OT Visit: 1 Procedure OT Evaluation $OT Eval Moderate Complexity: 1 Procedure G-Codes:    Harrel Carina, MS, OTR/L Harrel Carina 01/06/2016, 11:45 AM

## 2016-01-06 NOTE — Progress Notes (Signed)
Clinical Social Worker (CSW) received SNF consult. PT is recommending home health. RN Case Manager is aware of above. Please reconsult if future social work needs arise. CSW signing off.   Danea Manter Morgan, LCSW [phone] 

## 2016-01-06 NOTE — Progress Notes (Signed)
Physical Therapy Treatment Patient Details Name: Laura Walters MRN: [id number] DOB: [birth year] Today's Date: 01/06/2016    History of Present Illness Patient is an 82 y/o female that presents for R TKR on 01/05/2016    PT Comments    Pt progressing well. Throughout session demonstrates improved understanding of technique for transfers, ambulation, and stretch/strengthening exercises. Demonstrates good quad set. Range of motion progressing well. Pt and family/care takers have numerous questions; increased time on education of all areas and carry over to home. All have improved understanding and feel confident with activity/exercise versus rest concepts, use of ice, energy conservation techniques, swelling management, stretching techniques and transfer/ambulation techniques. Plan to see pt this afternoon for continued progression of strengthening, range of motion, function; initiate stair climbing.   Follow Up Recommendations  Home health PT     Equipment Recommendations   (pt has her own rw)    Recommendations for Other Services       Precautions / Restrictions Precautions Precautions: Fall Restrictions Weight Bearing Restrictions: Yes RLE Weight Bearing: Weight bearing as tolerated    Mobility  Bed Mobility Overal bed mobility: Modified Independent Bed Mobility: Supine to Sit     Supine to sit: Modified independent (Device/Increase time)     General bed mobility comments: Increased time and use of rails  Transfers Overall transfer level: Needs assistance Equipment used: Rolling walker (2 wheeled) Transfers: Sit to/from Stand Sit to Stand: Min guard         General transfer comment: Cues for safe use of hands and using RLE   Ambulation/Gait Ambulation/Gait assistance: Min guard Ambulation Distance (Feet): 110 Feet Assistive device: Rolling walker (2 wheeled) Gait Pattern/deviations: Step-to pattern;Step-through pattern Gait velocity: redueced Gait velocity  interpretation: Below normal speed for age/gender General Gait Details: Initially step to pattern, with cues for step through and appropriate distance of rw in regards to body, pt ambulates fluidly with reciprocal pattern, improved R knee flexion with swing phase.    Stairs            Wheelchair Mobility    Modified Rankin (Stroke Patients Only)       Balance Overall balance assessment: Needs assistance Sitting-balance support: Feet supported;Bilateral upper extremity supported Sitting balance-Leahy Scale: Good     Standing balance support: Bilateral upper extremity supported Standing balance-Leahy Scale: Fair Standing balance comment: with walker                    Cognition Arousal/Alertness: Awake/alert Behavior During Therapy: WFL for tasks assessed/performed Overall Cognitive Status: Within Functional Limits for tasks assessed                      Exercises Total Joint Exercises Ankle Circles/Pumps: AROM;Both;20 reps (long sit) Quad Sets: Strengthening;Both;20 reps (long sit) Knee Flexion: AAROM;Right (12 minutes with AAROM using L to assist; 3-4 positions/rep) Goniometric ROM: 2-75 degrees    General Comments        Pertinent Vitals/Pain Pain Assessment: 0-10 Pain Score: 6  (with movement/amb, 0 at rest) Pain Location: R knee Pain Intervention(s): Repositioned;RN gave pain meds during session;Ice applied;Monitored during session    Home Living Family/patient expects to be discharged to:: Private residence Living Arrangements: Children;Other relatives Available Help at Discharge: Family Type of Home: House Home Access: Stairs to enter Entrance Stairs-Rails: Can reach both Home Layout: One level Home Equipment: Walker - 4 wheels;Cane - quad;Tub bench;Bedside commode      Prior Function Level of Independence: Independent with  assistive device(s)      Comments: Pt.has not driven a long time.   PT Goals (current goals can now be  found in the care plan section) Acute Rehab PT Goals Patient Stated Goal: To return home Progress towards PT goals: Progressing toward goals    Frequency  BID    PT Plan Current plan remains appropriate    Co-evaluation             End of Session Equipment Utilized During Treatment: Gait belt Activity Tolerance: Patient tolerated treatment well (increased pain with ambulatiion/stretch; resolves with rest) Patient left: in chair;with call bell/phone within reach;with chair alarm set;with family/visitor present     Time: 1013-1100 PT Time Calculation (min) (ACUTE ONLY): 47 min  Charges:  $Gait Training: 8-22 mins $Therapeutic Exercise: 8-22 mins $Therapeutic Activity: 8-22 mins                    G Codes:      Charlaine Dalton, PTA 01/06/2016, 12:04 PM

## 2016-01-06 NOTE — Plan of Care (Signed)
Problem: Safety: Goal: Ability to remain free from injury will improve Outcome: Progressing Remaining free from falls this shift.  Problem: Pain Managment: Goal: General experience of comfort will improve Outcome: Progressing Pt with minimal pain. Pain control with oral pain medication.  Problem: Skin Integrity: Goal: Risk for impaired skin integrity will decrease Outcome: Progressing Surgical dressing remaining dry and intact.  Problem: Fluid Volume: Goal: Ability to maintain a balanced intake and output will improve Outcome: Progressing Foley catheter patent with adequate urine output.  Problem: Nutrition: Goal: Adequate nutrition will be maintained Outcome: Progressing Eating and drinking without difficulty.

## 2016-01-06 NOTE — Discharge Instructions (Signed)

## 2016-01-06 NOTE — Progress Notes (Signed)
Physical Therapy Treatment Patient Details Name: Laura Walters MRN: [id number] DOB: 1933/01/30 Today's Date: 01/06/2016    History of Present Illness Patient is an 82 y/o female that presents for R TKR on 01/05/2016    PT Comments    Pt in bed upon arrival; lethargic, but able to awaken and agreeable to PT. Pt notes needing to use the bathroom. Pt requires set up and Min guard for personal hygiene in stand. Pt progressing ambulation distance. Quality improves with continued practice and occasional cueing for appropriate step lengths for pain management and reciprocal pattern. Pt participates in supine bed exercises well as well as some seated flexion stretching. Pt provided with written exercise packet. Wished to attempt steps this session; however, floor being buffed when we arrived to PT gym. Other steps too far of a walk at this time; therefore, opted to continue walk and return to room for exercises. Pt will need to perform steps tomorrow. Continue PT for progression of range of motion, strength and all functional mobility to allow for an optimal, safe return home post acute care stay.   Follow Up Recommendations  Home health PT     Equipment Recommendations   (pt has her own rw)    Recommendations for Other Services       Precautions / Restrictions Precautions Precautions: Fall Restrictions Weight Bearing Restrictions: Yes RLE Weight Bearing: Weight bearing as tolerated    Mobility  Bed Mobility Overal bed mobility: Modified Independent Bed Mobility: Supine to Sit;Sit to Supine     Supine to sit: Modified independent (Device/Increase time) Sit to supine: Modified independent (Device/Increase time)   General bed mobility comments: Increased time and use of rails; use of trapeze to readjust in bed  Transfers Overall transfer level: Needs assistance Equipment used: Rolling walker (2 wheeled) Transfers: Sit to/from Stand (bed and commode) Sit to Stand: Min guard          General transfer comment: Cues for safe use of hands and using RLE   Ambulation/Gait Ambulation/Gait assistance: Min guard Ambulation Distance (Feet): 145 Feet (also to/from bathroom) Assistive device: Rolling walker (2 wheeled) Gait Pattern/deviations: Step-to pattern;Step-through pattern Gait velocity: redueced Gait velocity interpretation: Below normal speed for age/gender General Gait Details: Starts with step to with occasional cues to avoid too large of a step with RLE; improves with continued ambulation   Stairs Stairs:  (wished to attempt; however floor being buffed in PT gym. )          Wheelchair Mobility    Modified Rankin (Stroke Patients Only)       Balance Overall balance assessment: Needs assistance Sitting-balance support: Feet supported Sitting balance-Leahy Scale: Good     Standing balance support: Bilateral upper extremity supported Standing balance-Leahy Scale: Fair Standing balance comment: with walker                    Cognition Arousal/Alertness: Lethargic Behavior During Therapy: WFL for tasks assessed/performed Overall Cognitive Status: Within Functional Limits for tasks assessed                      Exercises Total Joint Exercises Ankle Circles/Pumps: AROM;Both;20 reps (long sit) Quad Sets: Strengthening;Both;20 reps;Supine (ankle on 2 towel rolls) Short Arc Quad: AROM;Right;20 reps;Supine Straight Leg Raises: AAROM;Right;10 reps;Supine Knee Flexion: AAROM;Right;Seated (5 minutes) Goniometric ROM: 2-75 degrees    General Comments        Pertinent Vitals/Pain Pain Assessment: 0-10 Pain Score: 5  Pain Location: R knee  Pain Intervention(s): Ice applied;Monitored during session;Limited activity within patient's tolerance    Home Living Family/patient expects to be discharged to:: Private residence Living Arrangements: Children;Other relatives Available Help at Discharge: Family Type of Home: House Home Access:  Stairs to enter Entrance Stairs-Rails: Can reach both Home Layout: One level Home Equipment: Walker - 4 wheels;Cane - quad;Tub bench;Bedside commode      Prior Function Level of Independence: Independent with assistive device(s)      Comments: Pt.has not driven a long time.   PT Goals (current goals can now be found in the care plan section) Acute Rehab PT Goals Patient Stated Goal: To return home Progress towards PT goals: Progressing toward goals    Frequency  BID    PT Plan Current plan remains appropriate    Co-evaluation             End of Session Equipment Utilized During Treatment: Gait belt Activity Tolerance: Patient tolerated treatment well (increased pain with ambulatiion/stretch; resolves with rest) Patient left: with call bell/phone within reach;with family/visitor present;in bed;with bed alarm set;with SCD's reapplied (polar care in place)     Time: [id number] PT Time Calculation (min) (ACUTE ONLY): 43 min  Charges:  $Gait Training: 8-22 mins $Therapeutic Exercise: 8-22 mins $Therapeutic Activity: 8-22 mins                    G Codes:      Charlaine Dalton, PTA 01/06/2016, 2:32 PM

## 2016-01-07 LAB — BASIC METABOLIC PANEL
ANION GAP: 9 (ref 5–15)
BUN: 12 mg/dL (ref 6–20)
CO2: 25 mmol/L (ref 22–32)
Calcium: 7.2 mg/dL — ABNORMAL LOW (ref 8.9–10.3)
Chloride: 102 mmol/L (ref 101–111)
Creatinine, Ser: 0.63 mg/dL (ref 0.44–1.00)
GFR calc non Af Amer: >60 mL/min (ref >60)
Glucose, Bld: 109 mg/dL — ABNORMAL HIGH (ref 65–99)
POTASSIUM: 3.4 mmol/L — AB (ref 3.5–5.1)
Sodium: 136 mmol/L (ref 135–145)

## 2016-01-07 LAB — CBC
HEMATOCRIT: 27 % — AB (ref 35.0–47.0)
HEMOGLOBIN: 9.2 g/dL — AB (ref 12.0–16.0)
MCH: 30.5 pg (ref 26.0–34.0)
MCHC: 34.1 g/dL (ref 32.0–36.0)
MCV: 89.6 fL (ref 80.0–100.0)
Platelets: 203 10*3/uL (ref 150–440)
RBC: 3.01 MIL/uL — AB (ref 3.80–5.20)
RDW: 14.1 % (ref 11.5–14.5)
WBC: 8.6 10*3/uL (ref 3.6–11.0)

## 2016-01-07 NOTE — Progress Notes (Signed)
Lovenox teaching done and patient repeated back demonstration.  No questions at this time.  Education on medications and follow up done.  PIV removed.    Polar Ice, TEDS, lovenox teaching box going home with patient.   Patients family at bedside.   No questions at this time  Patient escorted out of hospital via wheelchair by volunteers.

## 2016-01-07 NOTE — Care Management Important Message (Signed)
Important Message  Patient Details  Name: Laura Walters MRN: [id number] Date of Birth: Nov 14, 1932   Medicare Important Message Given:  Yes    [name] 01/07/2016, 12:31 PM

## 2016-01-07 NOTE — Progress Notes (Signed)
Orthopaedics-  I spoke with patient's daughter this AM. I had contacted Dr. Metro Kung office yesterday about the patient's recent PET scan. Dr. Oliva Bustard is out of the office this week, but I spoke with his partner Dr. Grayland Ormond who suggested the patient make an appointment to see Dr. Oliva Bustard in 2-3 weeks. This information was relayed to the patient's daughter.  James P. Holley Bouche M.D.

## 2016-01-07 NOTE — Discharge Summary (Signed)
Physician Discharge Summary  Patient ID: Laura Walters MRN: [id number] DOB/AGE: 1933-09-26 82 y.o.  Admit date: 01/05/2016 Discharge date: 01/07/2016  Admission Diagnoses:  primary osteoarthritis   Discharge Diagnoses: Patient Active Problem List   Diagnosis Date Noted  . S/P total knee arthroplasty 01/05/2016  . Cystocele 12/02/2015  . Rectocele 12/02/2015  . Enterocele 12/02/2015  . Acquired hypothyroidism 10/13/2015  . Pituitary neoplasm ([place]) 10/13/2015  . Malignant neoplasm of thyroid gland ([place]) 10/13/2015  . Degeneration of intervertebral disc of lumbar region 08/01/2015  . Lumbar canal stenosis 08/01/2015  . Neuritis or radiculitis due to rupture of lumbar intervertebral disc 08/01/2015  . LBP (low back pain) 03/30/2015  . Neuropathy ([place]) 03/30/2015  . Chest pain, non-cardiac 03/30/2015  . Neoplasm of pituitary gland ([place]) 03/30/2015  . Cancer of thyroid ([place]) 03/30/2015  . Combined fat and carbohydrate induced hyperlipemia 10/17/2014  . Primary cardiomyopathy ([place]) 10/14/2014    Past Medical History  Diagnosis Date  . Arthritis   . Cardiomyopathy ([place])     idiopathic  . Hypertension   . Neuropathy ([place])   . DDD (degenerative disc disease), lumbar   . RAD (reactive airway disease)   . Chest pain     non cardiac  . Cancer of thyroid ([place]) 03/30/2015  . Urge incontinence   . IBS (irritable bowel syndrome)   . Seasonal allergies   . Hypothyroidism   . Nocturia   . Vaginal polyp   . Low back pain   . Cystocele   . Rectocele   . Pelvic pain in female   . Vaginal atrophy   . Vulvitis   . Constipation   . H/O wheezing     advair as needed  . Chokes     easily   swallow test negative     Transfusion: Autovac transfusion given the first 6 hours postop   Consultants (if any):   case management for home health assistance  Discharged Condition: Improved  Hospital Course: Laura Walters is an 82 y.o. female who was admitted 01/05/2016 with a diagnosis of  degenerative arthrosis right knee and went to the operating room on 01/05/2016 and underwent the above named procedures.    Surgeries:Procedure(s): COMPUTER ASSISTED TOTAL KNEE ARTHROPLASTY on 01/05/2016  PRE-OPERATIVE DIAGNOSIS: Degenerative arthrosis of the right knee, primary  POST-OPERATIVE DIAGNOSIS: Same  PROCEDURE: Right total knee arthroplasty using computer-assisted navigation  SURGEON: Marciano Sequin. M.D.  ASSISTANT: Vance Peper, PA (present and scrubbed throughout the case, critical for assistance with exposure, retraction, instrumentation, and closure)  ANESTHESIA: spinal  ESTIMATED BLOOD LOSS: 100 mL  FLUIDS REPLACED: 1200 mL of crystalloid  TOURNIQUET TIME: 97 minutes  DRAINS: 2 medium drains to a reinfusion system  SOFT TISSUE RELEASES: Anterior cruciate ligament, posterior cruciate ligament, deep medial collateral ligament, patellofemoral ligament   IMPLANTS UTILIZED: DePuy Attune size 5 posterior stabilized femoral component (cemented), size 4 rotating platform tibial component (cemented), 35 mm medialized dome patella (cemented), and a 6 mm stabilized rotating platform polyethylene insert.  INDICATIONS FOR SURGERY: Laura Walters is a 82 y.o. year old female with a long history of progressive knee pain. X-rays demonstrated severe degenerative changes in tricompartmental fashion. The patient had not seen any significant improvement despite conservative nonsurgical intervention. After discussion of the risks and benefits of surgical intervention, the patient expressed understanding of the risks benefits and agree with plans for total knee arthroplasty.   The risks, benefits, and alternatives were discussed at length including but not  limited to the risks of infection, bleeding, nerve injury, stiffness, blood clots, the need for revision surgery, cardiopulmonary complications, among others, and they were willing to proceed. Patient tolerated the surgery well. No  complications .Patient was taken to PACU where she was stabilized and then transferred to the orthopedic floor.  Patient started on Lovenox 30 q 12 hrs. Foot pumps applied bilaterally at 80 mm hg. Heels elevated off bed with rolled towels. No evidence of DVT. Calves non tender. Negative [place]. Physical therapy started on day #1 for gait training and transfer with OT starting on  day #1 for ADL and assisted devices. Patient has done well with therapy. Ambulated greater than 200 feet upon being discharged. Was able to go up and down 4 steps independently and safely without any complications  Patient's Foley was discontinued on day #1 with the IV and Hemovac discontinued on day #2. Dressing also changed on day #2. IV was discontinued secondary to hypotension.   She was given perioperative antibiotics:  Anti-infectives    Start     Dose/Rate Route Frequency Ordered Stop   01/05/16 1330  clindamycin (CLEOCIN) IVPB 600 mg     600 mg 100 mL/hr over 30 Minutes Intravenous Every 6 hours 01/05/16 1217 01/06/16 0838   01/05/16 0630  clindamycin (CLEOCIN) IVPB 900 mg  Status:  Discontinued     900 mg 100 mL/hr over 30 Minutes Intravenous  Once 01/05/16 0618 01/05/16 1216   01/05/16 0630  clindamycin (CLEOCIN) 900 MG/50ML IVPB    Comments:  Rexanne Mano: cabinet override      01/05/16 0630 01/05/16 0730    .  She was fitted with AV 1 compression foot pump devices, instructed on heel pumps, early ambulation, and fitted with TED stockings bilaterally for DVT prophylaxis.  She benefited maximally from the hospital stay and there were no complications.    Recent vital signs:  Filed Vitals:   01/06/16 2013 01/07/16 0316  BP: 123/57 110/64  Pulse: 87 88  Temp: 100.3 F (37.9 C) 99 F (37.2 C)  Resp: 19 18    Recent laboratory studies:  Lab Results  Component Value Date   HGB 9.2* 01/07/2016   HGB 8.9* 01/06/2016   HGB 12.4 12/17/2015   Lab Results  Component Value Date   WBC 8.6  01/07/2016   PLT 203 01/07/2016   Lab Results  Component Value Date   INR 1.05 12/17/2015   Lab Results  Component Value Date   NA 136 01/07/2016   K 3.4* 01/07/2016   CL 102 01/07/2016   CO2 25 01/07/2016   BUN 12 01/07/2016   CREATININE 0.63 01/07/2016   GLUCOSE 109* 01/07/2016    Discharge Medications:     Medication List    STOP taking these medications        HYDROcodone-acetaminophen 10-325 MG tablet  Commonly known as:  NORCO      TAKE these medications        ADVAIR DISKUS 250-50 MCG/DOSE Aepb  Generic drug:  Fluticasone-Salmeterol  Inhale 1 puff into the lungs 2 (two) times daily.     carvedilol 3.125 MG tablet  Commonly known as:  COREG  Take 3.125 mg by mouth 2 (two) times daily with a meal.     DULoxetine 60 MG capsule  Commonly known as:  CYMBALTA  Take 60 mg by mouth daily.     enoxaparin 40 MG/0.4ML injection  Commonly known as:  LOVENOX  Inject 0.4 mLs (40 mg total) into  the skin daily.     ferrous sulfate 325 (65 FE) MG tablet  Take 325 mg by mouth 2 (two) times daily with a meal.     fexofenadine-pseudoephedrine 180-240 MG 24 hr tablet  Commonly known as:  ALLEGRA-D 24  1 tablet daily. hs     fluticasone 50 MCG/ACT nasal spray  Commonly known as:  FLONASE  Place into the nose.     furosemide 20 MG tablet  Commonly known as:  LASIX  Take 40 mg by mouth 2 (two) times daily.     gabapentin 400 MG capsule  Commonly known as:  NEURONTIN  Take 800 mg by mouth 3 (three) times daily.     levothyroxine 100 MCG tablet  Commonly known as:  SYNTHROID, LEVOTHROID  Take 100 mcg by mouth daily before breakfast.     nitrofurantoin (macrocrystal-monohydrate) 100 MG capsule  Commonly known as:  MACROBID  Take 1 capsule (100 mg total) by mouth 2 (two) times daily.     nystatin powder  Commonly known as:  MYCOSTATIN  Apply topically 2 (two) times daily as needed.     omeprazole 40 MG capsule  Commonly known as:  PRILOSEC  Take 40 mg by  mouth daily.     oxyCODONE 5 MG immediate release tablet  Commonly known as:  Oxy IR/ROXICODONE  Take 1-2 tablets (5-10 mg total) by mouth every 4 (four) hours as needed for severe pain or breakthrough pain.     PRESERVISION AREDS 2 Caps  Take 1 capsule by mouth 2 (two) times daily.     traMADol 50 MG tablet  Commonly known as:  ULTRAM  Take 1-2 tablets (50-100 mg total) by mouth every 4 (four) hours as needed for moderate pain.        Diagnostic Studies: Nm Pet Image Restag (ps) Skull Base To Thigh  01/01/2016  CLINICAL DATA:  Subsequent treatment strategy for thyroid cancer. Lung nodule. Restaging. EXAM: NUCLEAR MEDICINE PET SKULL BASE TO THIGH TECHNIQUE: 12.9 MCi F-18 FDG was injected intravenously. Full-ring PET imaging was performed from the skull base to thigh after the radiotracer. CT data was obtained and used for attenuation correction and anatomic localization. FASTING BLOOD GLUCOSE:  Value: 73 mg/dl COMPARISON:  12/23/2014 FINDINGS: NECK New hypermetabolism and fluid the left maxillary sinus, likely due to sinusitis. A new focus of hypermetabolism about the posterior aspect of the left cricoid cartilage is without CT correlate and in the region of the superior aspect of the surgical bed. This measures a S.U.V. max of 4.7, including on image 46/series 4. A newly hypermetabolic left posterior triangle node measures 6 mm and a S.U.V. max of 2.6 on image 28/series 4. Low left jugular node measures 12 mm and a S.U.V. max of 25.0 on image 52/series 4. 9 mm and a S.U.V. max of 15.7 on the prior. CHEST A high left paratracheal node measures 9 mm and a S.U.V. max of 12.9 today versus 6 mm and a S.U.V. max of 7.9 on the prior. Hypermetabolic pulmonary nodules. Index left lower lobe nodule measures 13 mm and a S.U.V. max of 8.9 today versus 7 mm and a S.U.V. max of 3.5 on the prior. Index right lower lobe nodule measures 10 mm and a S.U.V. max of 4.4 today versus 8 mm and a S.U.V. max of 1.9  previously. ABDOMEN/PELVIS No extraosseous hypermetabolism in the abdomen or pelvis. SKELETON Hypermetabolism about the posterior right ninth rib and adjacent right transverse process. Vague, measuring a S.U.V. max  of 4.7. Although there is no well-defined osseous lesion in this area, there is possible nondisplaced fracture, including on image 94/series 4. Vague hypermetabolism about the right side face that at L3-4. This measures a S.U.V. max of 4.5 com including on image 154/series 4. This is at the site of extensive facet arthropathy. Right chest wall hypermetabolism described on the prior exam has resolved, the site of a healed fracture. CT IMAGES PERFORMED FOR ATTENUATION CORRECTION Ethmoid air cell mucosal thickening. Carotid atherosclerosis. Thyroidectomy. Mild cardiomegaly with multivessel coronary artery atherosclerosis. Centrilobular emphysema. Small hiatal hernia. Colonic stool burden suggests constipation. Interval left hip arthroplasty with resultant beam hardening artifact. Pessary in place. IMPRESSION: 1. Progressive disease, as evidenced by increased size and hypermetabolism of cervical nodes, thoracic nodes, and pulmonary nodules. 2. Foci of osseous hypermetabolism which are indeterminate. Right posterior ninth rib and transverse process hypermetabolism could be posttraumatic, given suggestion of nondisplaced rib fracture. Right L3-4 hypermetabolism corresponds to extensive facet arthropathy and could be degenerative. 3.  Atherosclerosis, including within the coronary arteries. 4. Sinus disease. Electronically Signed   By: Abigail Miyamoto M.D.   On: 01/01/2016 15:43   Dg Knee Right Port  01/05/2016  CLINICAL DATA:  Total knee replacement EXAM: PORTABLE RIGHT KNEE - 1-2 VIEW COMPARISON:  MRI 04/18/2015 FINDINGS: Changes of total right knee replacement. Soft tissue drain in place. No hardware or bony complicating feature. IMPRESSION: Right knee replacement without complicating feature. Electronically  Signed   By: Rolm Baptise M.D.   On: 01/05/2016 11:27    Disposition: 01-Home or Self Care      Discharge Instructions    Diet - low sodium heart healthy    Complete by:  As directed      Diet - low sodium heart healthy    Complete by:  As directed      Increase activity slowly    Complete by:  As directed      Increase activity slowly    Complete by:  As directed               Signed: WOLFE,JON R. 01/07/2016, 6:57 AM

## 2016-01-07 NOTE — Care Management (Signed)
I have notified [facility] of patient discharge to home today. Patient had no questions. No further RNCM needs. Case closed.

## 2016-01-07 NOTE — Progress Notes (Signed)
   Subjective: 2 Days Post-Op Procedure(s) (LRB): COMPUTER ASSISTED TOTAL KNEE ARTHROPLASTY (Right) Patient reports pain as mild.   Patient is well, and has had no acute complaints or problems Continue with physical therapy today. Needs to do the complete lap and steps Plan is to go Home after hospital stay. no nausea and no vomiting Patient denies any chest pains or shortness of breath. Objective: Vital signs in last 24 hours: Temp:  [98 F (36.7 C)-100.3 F (37.9 C)] 99 F (37.2 C) (04/12 0316) Pulse Rate:  [66-88] 88 (04/12 0316) Resp:  [16-19] 18 (04/12 0316) BP: (89-123)/(46-64) 110/64 mmHg (04/12 0316) SpO2:  [94 %-100 %] 96 % (04/12 0316) well approximated incision Heels are non tender and elevated off the bed using rolled towels Intake/Output from previous day: 04/11 0701 - 04/12 0700 In: 2910 [P.O.:510; I.V.:2400] Out: 625 [Urine:625] Intake/Output this shift: Total I/O In: 1126.7 [I.V.:1126.7] Out: 625 [Urine:625]   Recent Labs  01/06/16 0439 01/07/16 0520  HGB 8.9* 9.2*    Recent Labs  01/06/16 0439 01/07/16 0520  WBC 8.0 8.6  RBC 2.93* 3.01*  HCT 25.8* 27.0*  PLT 217 203    Recent Labs  01/06/16 0439 01/07/16 0520  NA 136 136  K 3.3* 3.4*  CL 101 102  CO2 28 25  BUN 15 12  CREATININE 0.85 0.63  GLUCOSE 110* 109*  CALCIUM 7.0* 7.2*   No results for input(s): LABPT, INR in the last 72 hours.  EXAM General - Patient is Alert, Appropriate and Oriented Extremity - Neurologically intact Neurovascular intact Sensation intact distally Intact pulses distally Dorsiflexion/Plantar flexion intact Dressing - moderate drainage Motor Function - intact, moving foot and toes well on exam.    Past Medical History  Diagnosis Date  . Arthritis   . Cardiomyopathy ([place])     idiopathic  . Hypertension   . Neuropathy ([place])   . DDD (degenerative disc disease), lumbar   . RAD (reactive airway disease)   . Chest pain     non cardiac  . Cancer of  thyroid ([place]) 03/30/2015  . Urge incontinence   . IBS (irritable bowel syndrome)   . Seasonal allergies   . Hypothyroidism   . Nocturia   . Vaginal polyp   . Low back pain   . Cystocele   . Rectocele   . Pelvic pain in female   . Vaginal atrophy   . Vulvitis   . Constipation   . H/O wheezing     advair as needed  . Chokes     easily   swallow test negative    Assessment/Plan: 2 Days Post-Op Procedure(s) (LRB): COMPUTER ASSISTED TOTAL KNEE ARTHROPLASTY (Right) Active Problems:   S/P total knee arthroplasty  Estimated body mass index is 25.66 kg/(m^2) as calculated from the following:   Height as of this encounter: 5' (1.524 m).   Weight as of this encounter: 59.603 kg (131 lb 6.4 oz). Up with therapy D/C IV fluids Discharge home with home health after therapy today  Labs: Were reviewed DVT Prophylaxis - Lovenox, Foot Pumps and TED hose Weight-Bearing as tolerated to right leg Hemovac and dressing was changed this morning Patient needs to do PT and do the complete lap as well as steps prior to being discharged today. Please give the patient 2 extra honeycomb dressings to take home.  Jillyn Ledger. [place] 01/07/2016, 6:51 AM

## 2016-01-07 NOTE — Progress Notes (Signed)
Physical Therapy Treatment Patient Details Name: Laura Walters MRN: [id number] DOB: 1932-10-08 Today's Date: 01/07/2016    History of Present Illness Patient is an 82 y/o female that presents for R TKR on 01/05/2016    PT Comments    Pt continues to progress well demonstrating continued fluid ambulation and negotiation of steps. Pain with stretch and ambulation, but subsides with rest. Pt has good understanding with stretching/strengthening exercises. Demonstrates good safety with transfers. All mobility takes increased time, but progressing steadily. Pt to discharge home today and will continue PT services through home health.   Follow Up Recommendations  Home health PT     Equipment Recommendations       Recommendations for Other Services       Precautions / Restrictions Precautions Precautions: Fall Restrictions Weight Bearing Restrictions: Yes RLE Weight Bearing: Weight bearing as tolerated    Mobility  Bed Mobility Overal bed mobility: Modified Independent             General bed mobility comments: increased time  Transfers Overall transfer level: Modified independent Equipment used: Rolling walker (2 wheeled) Transfers: Sit to/from Stand Sit to Stand: Modified independent (Device/Increase time)         General transfer comment: cues only to use RLE as much as possible. Good hand placement  Ambulation/Gait Ambulation/Gait assistance: Min guard;Supervision Ambulation Distance (Feet): 120 Feet Assistive device: Rolling walker (2 wheeled) Gait Pattern/deviations: Step-through pattern Gait velocity: redueced Gait velocity interpretation: Below normal speed for age/gender General Gait Details: slow, guarded. Starts step to and increases to step through with good fluidity and flow with rw   Stairs Stairs: Yes Stairs assistance: Min guard Stair Management: Two rails;Step to pattern Number of Stairs: 4 General stair comments: Cues for proper  sequence  Wheelchair Mobility    Modified Rankin (Stroke Patients Only)       Balance Overall balance assessment: Needs assistance         Standing balance support: Bilateral upper extremity supported Standing balance-Leahy Scale: Fair                      Cognition Arousal/Alertness: Awake/alert Behavior During Therapy: WFL for tasks assessed/performed Overall Cognitive Status: Within Functional Limits for tasks assessed                      Exercises Total Joint Exercises Ankle Circles/Pumps: AROM;Both;20 reps Quad Sets: Strengthening;Both;20 reps (long sit) Knee Flexion: AAROM;Right (10 min stretching) Goniometric ROM: 0-75    General Comments        Pertinent Vitals/Pain Pain Assessment: 0-10 Pain Score: 5  (with stretch/ambulation) Pain Location: R knee Pain Intervention(s): Repositioned;Premedicated before session;Ice applied    Home Living                      Prior Function            PT Goals (current goals can now be found in the care plan section) Progress towards PT goals: Progressing toward goals    Frequency  BID    PT Plan Current plan remains appropriate    Co-evaluation             End of Session Equipment Utilized During Treatment: Gait belt Activity Tolerance: Patient tolerated treatment well Patient left: in chair;with call bell/phone within reach;with chair alarm set (polar care in place)     Time: [id number] PT Time Calculation (min) (ACUTE ONLY): 28 min  Charges:  $  Gait Training: 8-22 mins $Therapeutic Exercise: 8-22 mins                    G Codes:      Laura Walters, PTA 01/07/2016, 10:20 AM

## 2016-01-20 DIAGNOSIS — Z96659 Presence of unspecified artificial knee joint: Secondary | ICD-10-CM | POA: Insufficient documentation

## 2016-01-29 ENCOUNTER — Inpatient Hospital Stay: Payer: Medicare Other | Attending: Oncology

## 2016-01-29 ENCOUNTER — Encounter: Payer: Self-pay | Admitting: Oncology

## 2016-01-29 ENCOUNTER — Inpatient Hospital Stay (HOSPITAL_BASED_OUTPATIENT_CLINIC_OR_DEPARTMENT_OTHER): Payer: Medicare Other | Admitting: Oncology

## 2016-01-29 VITALS — BP 99/59 | HR 77 | Temp 96.4°F | Resp 18 | Wt 131.1 lb

## 2016-01-29 DIAGNOSIS — I10 Essential (primary) hypertension: Secondary | ICD-10-CM | POA: Diagnosis not present

## 2016-01-29 DIAGNOSIS — I429 Cardiomyopathy, unspecified: Secondary | ICD-10-CM | POA: Insufficient documentation

## 2016-01-29 DIAGNOSIS — Z923 Personal history of irradiation: Secondary | ICD-10-CM | POA: Diagnosis not present

## 2016-01-29 DIAGNOSIS — Z79899 Other long term (current) drug therapy: Secondary | ICD-10-CM | POA: Insufficient documentation

## 2016-01-29 DIAGNOSIS — E039 Hypothyroidism, unspecified: Secondary | ICD-10-CM | POA: Insufficient documentation

## 2016-01-29 DIAGNOSIS — C7800 Secondary malignant neoplasm of unspecified lung: Secondary | ICD-10-CM | POA: Diagnosis not present

## 2016-01-29 DIAGNOSIS — R911 Solitary pulmonary nodule: Secondary | ICD-10-CM

## 2016-01-29 DIAGNOSIS — C73 Malignant neoplasm of thyroid gland: Secondary | ICD-10-CM | POA: Insufficient documentation

## 2016-01-29 DIAGNOSIS — G629 Polyneuropathy, unspecified: Secondary | ICD-10-CM

## 2016-01-29 DIAGNOSIS — M810 Age-related osteoporosis without current pathological fracture: Secondary | ICD-10-CM | POA: Diagnosis not present

## 2016-01-29 DIAGNOSIS — C770 Secondary and unspecified malignant neoplasm of lymph nodes of head, face and neck: Secondary | ICD-10-CM | POA: Insufficient documentation

## 2016-01-29 DIAGNOSIS — Z96642 Presence of left artificial hip joint: Secondary | ICD-10-CM | POA: Diagnosis not present

## 2016-01-29 DIAGNOSIS — D649 Anemia, unspecified: Secondary | ICD-10-CM | POA: Diagnosis not present

## 2016-01-29 DIAGNOSIS — M1990 Unspecified osteoarthritis, unspecified site: Secondary | ICD-10-CM

## 2016-01-29 DIAGNOSIS — M5136 Other intervertebral disc degeneration, lumbar region: Secondary | ICD-10-CM | POA: Diagnosis not present

## 2016-01-29 LAB — COMPREHENSIVE METABOLIC PANEL
ALK PHOS: 134 U/L — AB (ref 38–126)
ALT: 11 U/L — ABNORMAL LOW (ref 14–54)
ANION GAP: 11 (ref 5–15)
AST: 21 U/L (ref 15–41)
Albumin: 3.7 g/dL (ref 3.5–5.0)
BILIRUBIN TOTAL: 0.4 mg/dL (ref 0.3–1.2)
BUN: 17 mg/dL (ref 6–20)
CALCIUM: 8.5 mg/dL — AB (ref 8.9–10.3)
CO2: 28 mmol/L (ref 22–32)
CREATININE: 0.97 mg/dL (ref 0.44–1.00)
Chloride: 98 mmol/L — ABNORMAL LOW (ref 101–111)
GFR, EST NON AFRICAN AMERICAN: 54 mL/min — AB (ref >60)
Glucose, Bld: 92 mg/dL (ref 65–99)
Potassium: 3.5 mmol/L (ref 3.5–5.1)
Sodium: 137 mmol/L (ref 135–145)
TOTAL PROTEIN: 7.4 g/dL (ref 6.5–8.1)

## 2016-01-29 LAB — CBC WITH DIFFERENTIAL/PLATELET
Basophils Absolute: 0 10*3/uL (ref 0–0.1)
Basophils Relative: 0 %
EOS ABS: 0.5 10*3/uL (ref 0–0.7)
EOS PCT: 8 %
HCT: 32.6 % — ABNORMAL LOW (ref 35.0–47.0)
Hemoglobin: 11 g/dL — ABNORMAL LOW (ref 12.0–16.0)
LYMPHS ABS: 0.7 10*3/uL — AB (ref 1.0–3.6)
Lymphocytes Relative: 11 %
MCH: 30.4 pg (ref 26.0–34.0)
MCHC: 33.7 g/dL (ref 32.0–36.0)
MCV: 90 fL (ref 80.0–100.0)
MONO ABS: 0.7 10*3/uL (ref 0.2–0.9)
MONOS PCT: 12 %
Neutro Abs: 4.4 10*3/uL (ref 1.4–6.5)
Neutrophils Relative %: 69 %
Platelets: 350 10*3/uL (ref 150–440)
RBC: 3.62 MIL/uL — ABNORMAL LOW (ref 3.80–5.20)
RDW: 15.3 % — ABNORMAL HIGH (ref 11.5–14.5)
WBC: 6.4 10*3/uL (ref 3.6–11.0)

## 2016-01-29 LAB — TSH: TSH: 1.231 u[IU]/mL (ref 0.350–4.500)

## 2016-01-29 NOTE — Progress Notes (Signed)
Patient had knee surgery on 01-05-16.  Doing well.  Walks with cane.  Patient here today for PET results.

## 2016-01-31 ENCOUNTER — Encounter: Payer: Self-pay | Admitting: Oncology

## 2016-01-31 DIAGNOSIS — J329 Chronic sinusitis, unspecified: Secondary | ICD-10-CM | POA: Insufficient documentation

## 2016-01-31 NOTE — Progress Notes (Signed)
[place] @ [facility] Telephone:(336) [phone]  Fax:(336) [place]: Mar 14, 1933  MR#: [id number]  IJF#:[id number]  Patient Care Team: Idelle Crouch, MD as PCP - General (Internal Medicine) Beverly Gust, MD as Consulting Physician (Unknown Physician Specialty)  CHIEF COMPLAINT:  Chief Complaint  Patient presents with  . Thyroid Cancer    Oncology History   Chief Complaint/Problem List  1.  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  Status post thyroidectomy and lymph node dissection in December 2003.  2.  Status post I-131 therapy in February 2004. 3.lung nodule some CT scan (May, 2012) 4.PET scan has been positive to the lung nodules and supraclavicular lymph node (September, 2012) 5.biopsy from supraclavicular lymph node was positive for  malignancy consistent with thyroid cancer 6.patient has received I-131 treatment in mid December, 2012        Cancer of thyroid [facility])   03/30/2015 Initial Diagnosis Cancer of thyroid    Oncology Flowsheet 01/05/2016 01/06/2016 01/06/2016 01/06/2016 01/06/2016 01/07/2016 01/07/2016  dexamethasone (DECADRON) IJ - - - - - - -  metoCLOPramide (REGLAN) PO 10 _0  mg 10 mg  ondansetron (ZOFRAN) IV - - - - - - -    INTERVAL HISTORY:  82 year old lady with stage IV carcinoma of thyroid.Came today further follow-up.  In the interim.  she has had left hip replacement.  Avascular necrosis of the left hip joint was found. Is gradually recovering: Undergoing rehabilitation therapy No cough or shortness of breath.  Appetite has been stable.  Patient continues to have severe numbness in lower extremity Patient is here for ongoing evaluation and consideration of treatment regarding thyroid cancer Patient continues to have problems with arthritis and knee replacement hip replacement      Patient had knee surgery on 01-05-16. Doing well. Walks with cane. Patient here today for PET results.    REVIEW OF  SYSTEMS:   Gen. status: Patient is recovering from Knee surgery.  Patient was in rehabilitation when walking with the help of a cane. Continues to have some aches and pains due to arthritis HEENT: No headache.  No dizziness.  No difficulty swallowing Cardiac: No chest pain Lungs: No cough or shortness of breath Shortness of breath on exertion. GI: Nausea no vomiting no diarrhea GU: No dysuria or hematuria Lower extremity no swelling Neurological system no headache no dizziness Skin: No rash. All other systems have been reviewed As per HPI. Otherwise, a complete review of systems is negatve.  PAST MEDICAL HISTORY: Past Medical History  Diagnosis Date  . Arthritis   . Cardiomyopathy ([place])     idiopathic  . Hypertension   . Neuropathy ([place])   . DDD (degenerative disc disease), lumbar   . RAD (reactive airway disease)   . Chest pain     non cardiac  . Cancer of thyroid ([place]) 03/30/2015  . Urge incontinence   . IBS (irritable bowel syndrome)   . Seasonal allergies   . Hypothyroidism   . Nocturia   . Vaginal polyp   . Low back pain   . Cystocele   . Rectocele   . Pelvic pain in female   . Vaginal atrophy   . Vulvitis   . Constipation   . H/O wheezing     advair as needed  . Chokes     easily   swallow test negative    ificant History/PMH:   cholesterol:    Allergic  Rhinitis:    Osteoporosis:    thyroid Cancer:    breast biospy:    Thyroidectomy:    Pituitary Surgery:   PFSH: Additional Past Medical and Surgical History: Past Medical History   Hemorrhoids.   Acute bronchitis.    Past Surgical History   Had previous history of breast surgery, negative for any malignancy.    Ovarian cyst removed, benign.    Family History   No family history of colorectal cancer, breast cancer, or ovarian cancer.    Social History   Does not smoke.  Does not drink alcohol.   ADVANCED DIRECTIVES:  Patient does have a living will and does not want to make any changes  at present time  HEALTH MAINTENANCE: Social History  Substance Use Topics  . Smoking status: Never Smoker   . Smokeless tobacco: Never Used  . Alcohol Use: No      Allergies  Allergen Reactions  . Aleve [Naproxen]   . Codeine Hives and Itching  . Penicillins Hives  . Sulfa Antibiotics Hives    Current Outpatient Prescriptions  Medication Sig Dispense Refill  . carvedilol (COREG) 3.125 MG tablet Take 3.125 mg by mouth 2 (two) times daily with a meal.    . DULoxetine (CYMBALTA) 60 MG capsule Take 60 mg by mouth daily.    . ferrous sulfate 325 (65 FE) MG tablet Take 325 mg by mouth 2 (two) times daily with a meal.    . fexofenadine-pseudoephedrine (ALLEGRA-D 24) 180-240 MG 24 hr tablet 1 tablet daily. hs    . fluticasone (FLONASE) 50 MCG/ACT nasal spray Place into the nose.    Marland Kitchen Fluticasone-Salmeterol (ADVAIR DISKUS) 250-50 MCG/DOSE AEPB Inhale 1 puff into the lungs 2 (two) times daily.    . furosemide (LASIX) 20 MG tablet Take 40 mg by mouth 2 (two) times daily.     Marland Kitchen gabapentin (NEURONTIN) 400 MG capsule Take 800 mg by mouth 3 (three) times daily.     Marland Kitchen HYDROcodone-acetaminophen (NORCO) 10-325 MG tablet Take by mouth.    . levothyroxine (SYNTHROID, LEVOTHROID) 100 MCG tablet Take 100 mcg by mouth daily before breakfast.    . Multiple Vitamins-Minerals (PRESERVISION AREDS 2) CAPS Take 1 capsule by mouth 2 (two) times daily.    Marland Kitchen nystatin (MYCOSTATIN) powder Apply topically 2 (two) times daily as needed.     Marland Kitchen omeprazole (PRILOSEC) 40 MG capsule Take 40 mg by mouth daily.    Marland Kitchen oxyCODONE (OXY IR/ROXICODONE) 5 MG immediate release tablet Take 1-2 tablets (5-10 mg total) by mouth every 4 (four) hours as needed for severe pain or breakthrough pain. 60 tablet 0  . traMADol (ULTRAM) 50 MG tablet Take 1-2 tablets (50-100 mg total) by mouth every 4 (four) hours as needed for moderate pain. 60 tablet 0   No current facility-administered medications for this visit.    OBJECTIVE:  Filed  Vitals:   01/29/16 1158  BP: 99/59  Pulse: 77  Temp: 96.4 F (35.8 C)  Resp: 18     Body mass index is 25.61 kg/(m^2).    ECOG FS:2 - Symptomatic, <50% confined to bed  PHYSICAL EXAM: Gen. status: Patient is alert oriented gradually recovering from tHe is not knee surgery walking with the help of a cane  Lymphatic system there are few small palpable lymph node on the left side of the neck otherwise no other palpable lymphadenopathy HEENT: Thyroid within normal limit.  No palpable masses.  No palpable lymph node  Examination of the chest  was unremarkable. There were no bony deformities, no asymmetry, and no other abnormalities. Abdominal exam revealed normal bowel sounds. The abdomen was soft, non-tender, and without masses, organomegaly, or appreciable enlargement of the abdominal aorta. Examination of the skin revealed no evidence of significant rashes, suspicious appearing nevi or other concerning lesions. Neurological system:: Peripheral neuropathy Cardiac exam revealed the PMI to be normally situated and sized. The rhythm was regular and no extrasystoles were noted during several minutes of auscultation. The first and second heart sounds were normal and physiologic splitting of the second heart sound was noted. There were no murmurs, rubs, clicks, or gallops.    LAB RESULTS:  Appointment on 01/29/2016  Component Date Value Ref Range Status  . WBC 01/29/2016 6.4  3.6 - 11.0 K/uL Final  . RBC 01/29/2016 3.62* 3.80 - 5.20 MIL/uL Final  . Hemoglobin 01/29/2016 11.0* 12.0 - 16.0 g/dL Final  . HCT 68/04/7491 32.6* 35.0 - 47.0 % Final  . MCV 01/29/2016 90.0  80.0 - 100.0 fL Final  . MCH 01/29/2016 30.4  26.0 - 34.0 pg Final  . MCHC 01/29/2016 33.7  32.0 - 36.0 g/dL Final  . RDW 07/29/8390 15.3* 11.5 - 14.5 % Final  . Platelets 01/29/2016 350  150 - 440 K/uL Final  . Neutrophils Relative % 01/29/2016 69   Final  . Neutro Abs 01/29/2016 4.4  1.4 - 6.5 K/uL Final  . Lymphocytes  Relative 01/29/2016 11   Final  . Lymphs Abs 01/29/2016 0.7* 1.0 - 3.6 K/uL Final  . Monocytes Relative 01/29/2016 12   Final  . Monocytes Absolute 01/29/2016 0.7  0.2 - 0.9 K/uL Final  . Eosinophils Relative 01/29/2016 8   Final  . Eosinophils Absolute 01/29/2016 0.5  0 - 0.7 K/uL Final  . Basophils Relative 01/29/2016 0   Final  . Basophils Absolute 01/29/2016 0.0  0 - 0.1 K/uL Final  . Sodium 01/29/2016 137  135 - 145 mmol/L Final  . Potassium 01/29/2016 3.5  3.5 - 5.1 mmol/L Final  . Chloride 01/29/2016 98* 101 - 111 mmol/L Final  . CO2 01/29/2016 28  22 - 32 mmol/L Final  . Glucose, Bld 01/29/2016 92  65 - 99 mg/dL Final  . BUN 41/56/9524 17  6 - 20 mg/dL Final  . Creatinine, Ser 01/29/2016 0.97  0.44 - 1.00 mg/dL Final  . Calcium 74/08/7374 8.5* 8.9 - 10.3 mg/dL Final  . Total Protein 01/29/2016 7.4  6.5 - 8.1 g/dL Final  . Albumin 53/63/1866 3.7  3.5 - 5.0 g/dL Final  . AST 62/88/3551 21  15 - 41 U/L Final  . ALT 01/29/2016 11* 14 - 54 U/L Final  . Alkaline Phosphatase 01/29/2016 134* 38 - 126 U/L Final  . Total Bilirubin 01/29/2016 0.4  0.3 - 1.2 mg/dL Final  . GFR calc non Af Amer 01/29/2016 54* >60 mL/min Final  . GFR calc Af Amer 01/29/2016 >60  >60 mL/min Final   Comment: (NOTE) The eGFR has been calculated using the CKD EPI equation. This calculation has not been validated in all clinical situations. eGFR's persistently <60 mL/min signify possible Chronic Kidney Disease.   . Anion gap 01/29/2016 11  5 - 15 Final  . TSH 01/29/2016 1.231  0.350 - 4.500 uIU/mL Final      ASSESSMENT: Stage IV carcinoma of the thyroid, with metastases to lung And lymph node.  Repeat PET scan has been reviewed and reviewed independently as well as with the patient shows progressive multiple lymph node enlargement  and there are FDG active.   Left knee surgery patient is recovering Peripheral neuropathy Multiple surgeries for knee and hip replacement  MEDICAL DECISION MAKING:  All  lab data has been reviewed.  Patient had some complaints of urinary frequency but cultures sent urine analysis is within normal limit.  Patient expressed understanding and was in agreement with this plan. She also understands that She can call clinic at any time with any questions, concerns, or complaints.   TSH is 1.2   Anemia multifactorial.  Progressing thyroid cancer   BASED  ON PET scan.        Following options were discussed for the treatment  Patient had a radioactive ablation therapy in 2004 in 2012. Current dose needs to be calculated (in 2012 patient received 585.92 millicurie) Dose used in 2004 not available  Most likely somewhere between 120-150 We have not proven whether this is radio iodine refractory disease or not  So following options includes  A. observation because of patient's age patient is asymptomatic from the thyroid cancer in progress and is very slow and not involving vital organs  OR.  B. the patient can tolerate more and radioactive iodine therapy as well as if disease is not radioactive iodine refractory and possibility of further treatment with radioactive ablation therapy can be considered.  I will discuss that with   [facility]  Radiologist. OR. C.  Systemic therapy witH  [place]  or Nexavar DETAII   side effects was discussed with the patient. She would discuss this with her family. .Total duration of visit was 45 minutes.  50% or more time was spent in counseling patient and family regarding prognosis and options of treatment and available resources       No matching staging information was found for the patient Forest Gleason, MD   01/31/2016 10:16 AM

## 2016-02-03 LAB — THYROGLOBULIN LEVEL

## 2016-02-03 LAB — T4: T4, Total: 7.2 ug/dL (ref 4.5–12.0)

## 2016-02-05 ENCOUNTER — Other Ambulatory Visit: Payer: Self-pay | Admitting: *Deleted

## 2016-02-05 DIAGNOSIS — C73 Malignant neoplasm of thyroid gland: Secondary | ICD-10-CM

## 2016-02-06 ENCOUNTER — Other Ambulatory Visit: Payer: Self-pay | Admitting: Family Medicine

## 2016-02-06 DIAGNOSIS — C73 Malignant neoplasm of thyroid gland: Secondary | ICD-10-CM

## 2016-02-11 ENCOUNTER — Ambulatory Visit: Payer: Medicare Other | Admitting: Obstetrics and Gynecology

## 2016-02-16 ENCOUNTER — Encounter
Admission: RE | Admit: 2016-02-16 | Discharge: 2016-02-16 | Disposition: A | Discharge status: Home / Self Care | Payer: Medicare Other | Source: Ambulatory Visit | Attending: Oncology | Admitting: Oncology

## 2016-02-16 DIAGNOSIS — C73 Malignant neoplasm of thyroid gland: Secondary | ICD-10-CM | POA: Insufficient documentation

## 2016-02-16 MED ORDER — THYROTROPIN ALFA 1.1 MG IM SOLR
0.9000 mg | INTRAMUSCULAR | Status: AC
  Administered 2016-02-16: 0.9 mg via INTRAMUSCULAR
  Filled 2016-02-16: qty 0.9

## 2016-02-17 ENCOUNTER — Encounter: Admission: RE | Admit: 2016-02-17 | Payer: Medicare Other | Source: Ambulatory Visit

## 2016-02-17 ENCOUNTER — Encounter
Admission: RE | Admit: 2016-02-17 | Discharge: 2016-02-17 | Disposition: A | Discharge status: Home / Self Care | Payer: Medicare Other | Source: Ambulatory Visit | Attending: Oncology | Admitting: Oncology

## 2016-02-17 ENCOUNTER — Ambulatory Visit: Payer: Medicare Other | Admitting: Obstetrics and Gynecology

## 2016-02-17 DIAGNOSIS — C73 Malignant neoplasm of thyroid gland: Secondary | ICD-10-CM | POA: Diagnosis not present

## 2016-02-17 MED ORDER — THYROTROPIN ALFA 1.1 MG IM SOLR
0.9000 mg | INTRAMUSCULAR | Status: AC
  Administered 2016-02-17: 0.9 mg via INTRAMUSCULAR
  Filled 2016-02-17: qty 0.9

## 2016-02-18 ENCOUNTER — Encounter
Admission: RE | Admit: 2016-02-18 | Discharge: 2016-02-18 | Disposition: A | Discharge status: Home / Self Care | Payer: Medicare Other | Source: Ambulatory Visit | Attending: Oncology | Admitting: Oncology

## 2016-02-18 ENCOUNTER — Ambulatory Visit: Payer: Medicare Other

## 2016-02-18 MED ORDER — SODIUM IODIDE I 131 CAPSULE
4.0000 | Freq: Once | INTRAVENOUS | Status: AC | PRN
  Administered 2016-02-18: 4.169 via ORAL

## 2016-02-20 ENCOUNTER — Encounter
Admission: RE | Admit: 2016-02-20 | Discharge: 2016-02-20 | Disposition: A | Discharge status: Home / Self Care | Payer: Medicare Other | Source: Ambulatory Visit | Attending: Oncology | Admitting: Oncology

## 2016-02-20 DIAGNOSIS — C73 Malignant neoplasm of thyroid gland: Secondary | ICD-10-CM | POA: Diagnosis not present

## 2016-02-25 ENCOUNTER — Ambulatory Visit: Payer: Medicare Other | Admitting: Obstetrics and Gynecology

## 2016-02-26 ENCOUNTER — Inpatient Hospital Stay: Payer: Medicare Other | Attending: Oncology | Admitting: Oncology

## 2016-02-26 VITALS — BP 118/67 | HR 77 | Temp 96.2°F | Resp 18 | Wt 134.0 lb

## 2016-02-26 DIAGNOSIS — E890 Postprocedural hypothyroidism: Secondary | ICD-10-CM | POA: Insufficient documentation

## 2016-02-26 DIAGNOSIS — G629 Polyneuropathy, unspecified: Secondary | ICD-10-CM | POA: Diagnosis not present

## 2016-02-26 DIAGNOSIS — M1990 Unspecified osteoarthritis, unspecified site: Secondary | ICD-10-CM | POA: Diagnosis not present

## 2016-02-26 DIAGNOSIS — Z96659 Presence of unspecified artificial knee joint: Secondary | ICD-10-CM | POA: Insufficient documentation

## 2016-02-26 DIAGNOSIS — C770 Secondary and unspecified malignant neoplasm of lymph nodes of head, face and neck: Secondary | ICD-10-CM | POA: Diagnosis not present

## 2016-02-26 DIAGNOSIS — C73 Malignant neoplasm of thyroid gland: Secondary | ICD-10-CM | POA: Diagnosis not present

## 2016-02-26 DIAGNOSIS — Z96642 Presence of left artificial hip joint: Secondary | ICD-10-CM | POA: Insufficient documentation

## 2016-02-26 DIAGNOSIS — Z923 Personal history of irradiation: Secondary | ICD-10-CM | POA: Diagnosis not present

## 2016-02-26 DIAGNOSIS — I10 Essential (primary) hypertension: Secondary | ICD-10-CM | POA: Diagnosis not present

## 2016-02-26 DIAGNOSIS — D649 Anemia, unspecified: Secondary | ICD-10-CM | POA: Insufficient documentation

## 2016-02-26 DIAGNOSIS — Z79899 Other long term (current) drug therapy: Secondary | ICD-10-CM | POA: Diagnosis not present

## 2016-02-26 DIAGNOSIS — M791 Myalgia: Secondary | ICD-10-CM | POA: Diagnosis not present

## 2016-02-26 DIAGNOSIS — C7800 Secondary malignant neoplasm of unspecified lung: Secondary | ICD-10-CM | POA: Diagnosis not present

## 2016-02-26 DIAGNOSIS — M5136 Other intervertebral disc degeneration, lumbar region: Secondary | ICD-10-CM | POA: Insufficient documentation

## 2016-02-26 DIAGNOSIS — R590 Localized enlarged lymph nodes: Secondary | ICD-10-CM | POA: Insufficient documentation

## 2016-02-27 ENCOUNTER — Encounter: Payer: Self-pay | Admitting: Oncology

## 2016-02-27 DIAGNOSIS — J45909 Unspecified asthma, uncomplicated: Secondary | ICD-10-CM | POA: Insufficient documentation

## 2016-02-27 NOTE — Progress Notes (Signed)
[place] @ [facility] [phone]  Fax:(336) [place]: 06/12/1933  MR#: [id number]  ZHY#:[id number]  Patient Care Team: Idelle Crouch, MD as PCP - General (Internal Medicine) Beverly Gust, MD as Consulting Physician (Unknown Physician Specialty)  CHIEF COMPLAINT:  Chief Complaint  Patient presents with  . Thyroid Cancer    Oncology History   Chief Complaint/Problem List  1.  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  Status post thyroidectomy and lymph node dissection in December 2003.  2.  Status post I-131 therapy in February 2004. 3.lung nodule some CT scan (May, 2012) 4.PET scan has been positive to the lung nodules and supraclavicular lymph node (September, 2012) 5.biopsy from supraclavicular lymph node was positive for  malignancy consistent with thyroid cancer 6.patient has received I-131 treatment in mid December, 2012        Cancer of thyroid [facility])   03/30/2015 Initial Diagnosis Cancer of thyroid      INTERVAL HISTORY:  82 year old lady with stage IV carcinoma of thyroid.Came today further follow-up.  In the interim.  she has had left hip replacement.  Avascular necrosis of the left hip joint was found. Is gradually recovering: Undergoing rehabilitation therapy No cough or shortness of breath.  Appetite has been stable.  Patient continues to have severe numbness in lower extremity Patient is here for ongoing evaluation and consideration of treatment regarding thyroid cancer Patient continues to have problems with arthritis and knee replacement hip replacement      Patient had knee surgery on 01-05-16. Doing well. Walks with cane. Patient here today for PET results.  Patient had I-131 scan to assess responsiveness of malignancy  REVIEW OF SYSTEMS:   Gen. status: Patient is recovering from Knee surgery.  Patient was in rehabilitation when walking with the help of a cane. Continues to have some aches and pains due to  arthritis HEENT: No headache.  No dizziness.  No difficulty swallowing Cardiac: No chest pain Lungs: No cough or shortness of breath Shortness of breath on exertion. GI: Nausea no vomiting no diarrhea GU: No dysuria or hematuria Lower extremity no swelling Neurological system no headache no dizziness Skin: No rash. All other systems have been reviewed As per HPI. Otherwise, a complete review of systems is negatve.  PAST MEDICAL HISTORY: Past Medical History  Diagnosis Date  . Arthritis   . Cardiomyopathy ([place])     idiopathic  . Hypertension   . Neuropathy ([place])   . DDD (degenerative disc disease), lumbar   . RAD (reactive airway disease)   . Chest pain     non cardiac  . Cancer of thyroid ([place]) 03/30/2015  . Urge incontinence   . IBS (irritable bowel syndrome)   . Seasonal allergies   . Hypothyroidism   . Nocturia   . Vaginal polyp   . Low back pain   . Cystocele   . Rectocele   . Pelvic pain in female   . Vaginal atrophy   . Vulvitis   . Constipation   . H/O wheezing     advair as needed  . Chokes     easily   swallow test negative    ificant History/PMH:   cholesterol:    Allergic Rhinitis:    Osteoporosis:    thyroid Cancer:    breast biospy:    Thyroidectomy:    Pituitary Surgery:   PFSH: Additional Past Medical and Surgical History: Past Medical History   Hemorrhoids.   Acute  bronchitis.    Past Surgical History   Had previous history of breast surgery, negative for any malignancy.    Ovarian cyst removed, benign.    Family History   No family history of colorectal cancer, breast cancer, or ovarian cancer.    Social History   Does not smoke.  Does not drink alcohol.   ADVANCED DIRECTIVES:  Patient does have a living will and does not want to make any changes at present time  HEALTH MAINTENANCE: Social History  Substance Use Topics  . Smoking status: Never Smoker   . Smokeless tobacco: Never Used  . Alcohol Use: No       Allergies  Allergen Reactions  . Aleve [Naproxen]   . Codeine Hives and Itching  . Penicillins Hives  . Sulfa Antibiotics Hives    Current Outpatient Prescriptions  Medication Sig Dispense Refill  . carvedilol (COREG) 3.125 MG tablet Take 3.125 mg by mouth 2 (two) times daily with a meal.    . DULoxetine (CYMBALTA) 60 MG capsule Take 60 mg by mouth daily.    . ferrous sulfate 325 (65 FE) MG tablet Take 325 mg by mouth 2 (two) times daily with a meal.    . fexofenadine-pseudoephedrine (ALLEGRA-D 24) 180-240 MG 24 hr tablet 1 tablet daily. hs    . fluticasone (FLONASE) 50 MCG/ACT nasal spray Place into the nose.    Marland Kitchen Fluticasone-Salmeterol (ADVAIR DISKUS) 250-50 MCG/DOSE AEPB Inhale 1 puff into the lungs 2 (two) times daily.    . furosemide (LASIX) 20 MG tablet Take 40 mg by mouth 2 (two) times daily.     Marland Kitchen gabapentin (NEURONTIN) 400 MG capsule Take 800 mg by mouth 3 (three) times daily.     Marland Kitchen levothyroxine (SYNTHROID, LEVOTHROID) 100 MCG tablet Take 100 mcg by mouth daily before breakfast.    . Multiple Vitamins-Minerals (PRESERVISION AREDS 2) CAPS Take 1 capsule by mouth 2 (two) times daily.    Marland Kitchen nystatin (MYCOSTATIN) powder Apply topically 2 (two) times daily as needed.     Marland Kitchen omeprazole (PRILOSEC) 40 MG capsule Take 40 mg by mouth daily.    Marland Kitchen oxyCODONE (OXY IR/ROXICODONE) 5 MG immediate release tablet Take 1-2 tablets (5-10 mg total) by mouth every 4 (four) hours as needed for severe pain or breakthrough pain. 60 tablet 0  . traMADol (ULTRAM) 50 MG tablet Take 1-2 tablets (50-100 mg total) by mouth every 4 (four) hours as needed for moderate pain. 60 tablet 0   No current facility-administered medications for this visit.    OBJECTIVE:  Filed Vitals:   02/26/16 1528  BP: 118/67  Pulse: 77  Temp: 96.2 F (35.7 C)  Resp: 18     Body mass index is 26.17 kg/(m^2).    ECOG FS:2 - Symptomatic, <50% confined to bed  PHYSICAL EXAM: Gen. status: Patient is alert oriented  gradually recovering from tHe is not knee surgery walking with the help of a cane  Lymphatic system there are few small palpable lymph node on the left side of the neck otherwise no other palpable lymphadenopathy HEENT: Thyroid within normal limit.  No palpable masses.  No palpable lymph node  Examination of the chest was unremarkable. There were no bony deformities, no asymmetry, and no other abnormalities. Abdominal exam revealed normal bowel sounds. The abdomen was soft, non-tender, and without masses, organomegaly, or appreciable enlargement of the abdominal aorta. Examination of the skin revealed no evidence of significant rashes, suspicious appearing nevi or other concerning lesions. Neurological system::  Peripheral neuropathy Cardiac exam revealed the PMI to be normally situated and sized. The rhythm was regular and no extrasystoles were noted during several minutes of auscultation. The first and second heart sounds were normal and physiologic splitting of the second heart sound was noted. There were no murmurs, rubs, clicks, or gallops.    LAB RESULTS:  No visits with results within 2 Day(s) from this visit. Latest known visit with results is:  Appointment on 01/29/2016  Component Date Value Ref Range Status  . WBC 01/29/2016 6.4  3.6 - 11.0 K/uL Final  . RBC 01/29/2016 3.62* 3.80 - 5.20 MIL/uL Final  . Hemoglobin 01/29/2016 11.0* 12.0 - 16.0 g/dL Final  . HCT 13/68/5992 32.6* 35.0 - 47.0 % Final  . MCV 01/29/2016 90.0  80.0 - 100.0 fL Final  . MCH 01/29/2016 30.4  26.0 - 34.0 pg Final  . MCHC 01/29/2016 33.7  32.0 - 36.0 g/dL Final  . RDW 34/14/4360 15.3* 11.5 - 14.5 % Final  . Platelets 01/29/2016 350  150 - 440 K/uL Final  . Neutrophils Relative % 01/29/2016 69   Final  . Neutro Abs 01/29/2016 4.4  1.4 - 6.5 K/uL Final  . Lymphocytes Relative 01/29/2016 11   Final  . Lymphs Abs 01/29/2016 0.7* 1.0 - 3.6 K/uL Final  . Monocytes Relative 01/29/2016 12   Final  . Monocytes  Absolute 01/29/2016 0.7  0.2 - 0.9 K/uL Final  . Eosinophils Relative 01/29/2016 8   Final  . Eosinophils Absolute 01/29/2016 0.5  0 - 0.7 K/uL Final  . Basophils Relative 01/29/2016 0   Final  . Basophils Absolute 01/29/2016 0.0  0 - 0.1 K/uL Final  . Sodium 01/29/2016 137  135 - 145 mmol/L Final  . Potassium 01/29/2016 3.5  3.5 - 5.1 mmol/L Final  . Chloride 01/29/2016 98* 101 - 111 mmol/L Final  . CO2 01/29/2016 28  22 - 32 mmol/L Final  . Glucose, Bld 01/29/2016 92  65 - 99 mg/dL Final  . BUN 16/58/0063 17  6 - 20 mg/dL Final  . Creatinine, Ser 01/29/2016 0.97  0.44 - 1.00 mg/dL Final  . Calcium 49/49/4473 8.5* 8.9 - 10.3 mg/dL Final  . Total Protein 01/29/2016 7.4  6.5 - 8.1 g/dL Final  . Albumin 95/84/4171 3.7  3.5 - 5.0 g/dL Final  . AST 27/87/1836 21  15 - 41 U/L Final  . ALT 01/29/2016 11* 14 - 54 U/L Final  . Alkaline Phosphatase 01/29/2016 134* 38 - 126 U/L Final  . Total Bilirubin 01/29/2016 0.4  0.3 - 1.2 mg/dL Final  . GFR calc non Af Amer 01/29/2016 54* >60 mL/min Final  . GFR calc Af Amer 01/29/2016 >60  >60 mL/min Final   Comment: (NOTE) The eGFR has been calculated using the CKD EPI equation. This calculation has not been validated in all clinical situations. eGFR's persistently <60 mL/min signify possible Chronic Kidney Disease.   . Anion gap 01/29/2016 11  5 - 15 Final  . T4, Total 01/29/2016 7.2  4.5 - 12.0 ug/dL Final   Comment: (NOTE) Performed At: [facility] [address] [place], [place] [id number] Mila Homer MD WX:[id number]   . TSH 01/29/2016 1.231  0.350 - 4.500 uIU/mL Final  . Thyroglobulin 01/29/2016 <2.0   Final   Comment: (NOTE) Reference Range: Pubertal Children and Adults: <40 According to the [facility] [employer], the reference interval for Thyroglobulin (TG) should be related to euthyroid patients and not for patients who underwent  thyroidectomy.  TG reference intervals for these patients depend on  the residual mass of the thyroid tissue left after surgery.  Establishing a post-operative baseline is recommended.  The assay quantitation limit is 2.0 ng/mL. Performed At: [facility] [place], [place] [id number] [place] Sheral Apley MD HC:[id number]       ASSESSMENT: Stage IV carcinoma of the thyroid, with metastases to lung And lymph node.  Repeat PET scan has been reviewed and reviewed independently as well as with the patient shows progressive multiple lymph node enlargement and there are FDG active.   Left knee surgery patient is recovering Peripheral neuropathy Multiple surgeries for knee and hip replacement  MEDICAL DECISION MAKING:  All lab data has been reviewed.  Patient had some complaints of urinary frequency but cultures sent urine analysis is within normal limit.  Patient expressed understanding and was in agreement with this plan. She also understands that She can call clinic at any time with any questions, concerns, or complaints.   TSH is 1.2   Anemia multifactorial.  Progressing thyroid cancer   BASED  ON PET scan.      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 Current dose needs to be calculated (in 2012 patient received 176.16 millicurie) Dose used in 2004 not available  Most likely somewhere between 120-150 We have not proven whether this is radio iodine refractory disease or not  So following options includes  A. observation because of patient's age patient is asymptomatic from the thyroid cancer in progress and is very slow and not involving vital organs  OR. Patient cannot get I-131 treatment because her disease is not I-131 avid I had prolonged discussion with the patient explaining to her that I-131 ablation is not  an option at present time  C.  Systemic therapy witH  LENVIM[place]  or Nexavar.   Patient is adamant about getting biopsy of the lymph node to rule out any other malignancy I would discuss that with the ENT physician. Detailed discussion regarding all these options  30 minutes.  50% or more time was spent in counseling patient and family regarding prognosis and options of treatment and available resources       No matching staging information was found for the patient Forest Gleason, MD   02/27/2016 6:07 PM

## 2016-03-03 ENCOUNTER — Encounter: Payer: Self-pay | Admitting: Obstetrics and Gynecology

## 2016-03-03 ENCOUNTER — Ambulatory Visit (INDEPENDENT_AMBULATORY_CARE_PROVIDER_SITE_OTHER): Payer: Medicare Other | Admitting: Obstetrics and Gynecology

## 2016-03-03 VITALS — BP 137/74 | HR 71 | Ht 60.0 in | Wt 133.5 lb

## 2016-03-03 DIAGNOSIS — N816 Rectocele: Secondary | ICD-10-CM

## 2016-03-03 DIAGNOSIS — N8110 Cystocele, unspecified: Secondary | ICD-10-CM | POA: Diagnosis not present

## 2016-03-03 DIAGNOSIS — Z4689 Encounter for fitting and adjustment of other specified devices: Secondary | ICD-10-CM

## 2016-03-03 DIAGNOSIS — IMO0002 Reserved for concepts with insufficient information to code with codable children: Secondary | ICD-10-CM

## 2016-03-03 DIAGNOSIS — K469 Unspecified abdominal hernia without obstruction or gangrene: Secondary | ICD-10-CM

## 2016-03-03 NOTE — Progress Notes (Signed)
Complaint: 1. Pessary maintenance 2. Pelvic organ prolapse  Patient presents for 6 week interval pessary maintenance. She is status post knee replacement surgery. She is doing well from the pessary standpoint without any vaginal bleeding, vaginal discharge, odor, or pelvic pain. Trimosan gel is being inserted weekly intravaginally.  Past medical history, past surgical history, problem list, medications, and allergies are reviewed  OBJECTIVE: BP 137/74 mmHg  Pulse 71  Ht 5' (1.524 m)  Wt 133 lb 8 oz (60.555 kg)  BMI 26.07 kg/m2 Pleasant, well-appearing female in no acute distress. Limited mobility; right knee replacement scar is healing well Abdomen: Soft, nontender. Pelvic exam: External genitalia-atrophic. BUS-urethral caruncle present.. Vagina-third degree cystocele; moderate rectocele; vaginal enterocele. No evidence of discharge.  Cervix-surgically absent Uterus-surgically absent. Bimanual-no palpable masses  PROCEDURE: Pessary is removed, cleaned, and reinserted  ASSESSMENT: 1. Pelvic organ prolapse with good response to gel 1 pessary 2. Normal pessary maintenance  PLAN: 1. Continue Trimosan gel intravaginally weekly 2. Return in 8 weeks for pessary maintenance  A total of 15 minutes were spent face-to-face with the patient during this encounter and over half of that time dealt with counseling and coordination of care.  Brayton Mars, MD  Note: This dictation was prepared with Dragon dictation along with smaller phrase technology. Any transcriptional errors that result from this process are unintentional.

## 2016-03-03 NOTE — Patient Instructions (Signed)
1. Continue using Trimosan gel intravaginal once a week 2. Return in 8 weeks for pessary maintenance

## 2016-03-09 ENCOUNTER — Other Ambulatory Visit: Payer: Self-pay | Admitting: Unknown Physician Specialty

## 2016-03-09 DIAGNOSIS — R590 Localized enlarged lymph nodes: Secondary | ICD-10-CM

## 2016-03-16 ENCOUNTER — Ambulatory Visit: Payer: Medicare Other

## 2016-03-23 ENCOUNTER — Other Ambulatory Visit: Payer: Self-pay | Admitting: Radiology

## 2016-03-24 ENCOUNTER — Ambulatory Visit
Admission: RE | Admit: 2016-03-24 | Discharge: 2016-03-24 | Disposition: A | Discharge status: Home / Self Care | Payer: Medicare Other | Source: Ambulatory Visit | Attending: Unknown Physician Specialty | Admitting: Unknown Physician Specialty

## 2016-03-24 DIAGNOSIS — R599 Enlarged lymph nodes, unspecified: Secondary | ICD-10-CM | POA: Insufficient documentation

## 2016-03-24 DIAGNOSIS — R590 Localized enlarged lymph nodes: Secondary | ICD-10-CM

## 2016-03-24 DIAGNOSIS — C73 Malignant neoplasm of thyroid gland: Secondary | ICD-10-CM | POA: Diagnosis not present

## 2016-03-24 NOTE — Discharge Instructions (Signed)
Needle Biopsy, Care After °Refer to this sheet in the next few weeks. These instructions provide you with information about caring for yourself after your procedure. Your health care provider may also give you more specific instructions. Your treatment has been planned according to current medical practices, but problems sometimes occur. Call your health care provider if you have any problems or questions after your procedure. °WHAT TO EXPECT AFTER THE PROCEDURE °After your procedure, it is common to have soreness, bruising, or mild pain at the biopsy site. This should go away in a few days. °HOME CARE INSTRUCTIONS °· Rest as directed by your health care provider. °· Take medicines only as directed by your health care provider. °· There are many different ways to close and cover the biopsy site, including stitches (sutures), skin glue, and adhesive strips. Follow your health care provider's instructions about: °¨ Biopsy site care. °¨ Bandage (dressing) changes and removal. °¨ Biopsy site closure removal. °· Check your biopsy site every day for signs of infection. Watch for: °¨ Redness, swelling, or pain. °¨ Fluid, blood, or pus. °SEEK MEDICAL CARE IF: °· You have a fever. °· You have redness, swelling, or pain at the biopsy site that lasts longer than a few days. °· You have fluid, blood, or pus coming from the biopsy site. °· You feel nauseous. °· You vomit. °SEEK IMMEDIATE MEDICAL CARE IF: °· You have shortness of breath. °· You have trouble breathing. °· You have chest pain.   °· You feel dizzy or you faint. °· You have bleeding that does not stop with pressure or a bandage. °· You cough up blood. °· You have pain in your abdomen. °  °This information is not intended to replace advice given to you by your health care provider. Make sure you discuss any questions you have with your health care provider. °  °Document Released: 01/28/2015 Document Reviewed: 01/28/2015 °Elsevier Interactive Patient Education ©2016  [employer] ° °

## 2016-03-24 NOTE — Procedures (Signed)
Under US guidance, FNA of left supraclavicular lymph node was performed. No immediate complications.

## 2016-03-25 ENCOUNTER — Ambulatory Visit: Payer: Medicare Other | Admitting: Obstetrics and Gynecology

## 2016-03-25 LAB — CYTOLOGY - NON PAP

## 2016-04-09 ENCOUNTER — Inpatient Hospital Stay: Payer: Medicare Other | Attending: Hematology and Oncology

## 2016-04-09 ENCOUNTER — Encounter: Payer: Self-pay | Admitting: Hematology and Oncology

## 2016-04-09 ENCOUNTER — Inpatient Hospital Stay (HOSPITAL_BASED_OUTPATIENT_CLINIC_OR_DEPARTMENT_OTHER): Payer: Medicare Other | Admitting: Hematology and Oncology

## 2016-04-09 VITALS — BP 137/81 | HR 71 | Temp 97.4°F | Resp 18 | Wt 133.7 lb

## 2016-04-09 DIAGNOSIS — G629 Polyneuropathy, unspecified: Secondary | ICD-10-CM | POA: Diagnosis not present

## 2016-04-09 DIAGNOSIS — M129 Arthropathy, unspecified: Secondary | ICD-10-CM | POA: Insufficient documentation

## 2016-04-09 DIAGNOSIS — K589 Irritable bowel syndrome without diarrhea: Secondary | ICD-10-CM

## 2016-04-09 DIAGNOSIS — J45909 Unspecified asthma, uncomplicated: Secondary | ICD-10-CM | POA: Diagnosis not present

## 2016-04-09 DIAGNOSIS — C73 Malignant neoplasm of thyroid gland: Secondary | ICD-10-CM | POA: Insufficient documentation

## 2016-04-09 DIAGNOSIS — N8110 Cystocele, unspecified: Secondary | ICD-10-CM | POA: Insufficient documentation

## 2016-04-09 DIAGNOSIS — K5900 Constipation, unspecified: Secondary | ICD-10-CM

## 2016-04-09 DIAGNOSIS — I10 Essential (primary) hypertension: Secondary | ICD-10-CM

## 2016-04-09 DIAGNOSIS — R079 Chest pain, unspecified: Secondary | ICD-10-CM

## 2016-04-09 DIAGNOSIS — M5136 Other intervertebral disc degeneration, lumbar region: Secondary | ICD-10-CM

## 2016-04-09 DIAGNOSIS — N3941 Urge incontinence: Secondary | ICD-10-CM

## 2016-04-09 DIAGNOSIS — M899 Disorder of bone, unspecified: Secondary | ICD-10-CM

## 2016-04-09 DIAGNOSIS — R918 Other nonspecific abnormal finding of lung field: Secondary | ICD-10-CM

## 2016-04-09 DIAGNOSIS — R351 Nocturia: Secondary | ICD-10-CM | POA: Insufficient documentation

## 2016-04-09 DIAGNOSIS — E039 Hypothyroidism, unspecified: Secondary | ICD-10-CM | POA: Diagnosis not present

## 2016-04-09 DIAGNOSIS — N816 Rectocele: Secondary | ICD-10-CM | POA: Diagnosis not present

## 2016-04-09 DIAGNOSIS — Z79899 Other long term (current) drug therapy: Secondary | ICD-10-CM | POA: Diagnosis not present

## 2016-04-09 DIAGNOSIS — N842 Polyp of vagina: Secondary | ICD-10-CM | POA: Insufficient documentation

## 2016-04-09 DIAGNOSIS — I429 Cardiomyopathy, unspecified: Secondary | ICD-10-CM

## 2016-04-09 DIAGNOSIS — C779 Secondary and unspecified malignant neoplasm of lymph node, unspecified: Secondary | ICD-10-CM

## 2016-04-09 DIAGNOSIS — E890 Postprocedural hypothyroidism: Secondary | ICD-10-CM

## 2016-04-09 DIAGNOSIS — N762 Acute vulvitis: Secondary | ICD-10-CM | POA: Insufficient documentation

## 2016-04-09 DIAGNOSIS — C7800 Secondary malignant neoplasm of unspecified lung: Secondary | ICD-10-CM | POA: Insufficient documentation

## 2016-04-09 LAB — COMPREHENSIVE METABOLIC PANEL
ALT: 16 U/L (ref 14–54)
AST: 29 U/L (ref 15–41)
Albumin: 4 g/dL (ref 3.5–5.0)
Alkaline Phosphatase: 129 U/L — ABNORMAL HIGH (ref 38–126)
Anion gap: 8 (ref 5–15)
BUN: 21 mg/dL — ABNORMAL HIGH (ref 6–20)
CHLORIDE: 97 mmol/L — AB (ref 101–111)
CO2: 29 mmol/L (ref 22–32)
CREATININE: 1.02 mg/dL — AB (ref 0.44–1.00)
Calcium: 8.4 mg/dL — ABNORMAL LOW (ref 8.9–10.3)
GFR, EST AFRICAN AMERICAN: 59 mL/min — AB (ref >60)
GFR, EST NON AFRICAN AMERICAN: 51 mL/min — AB (ref >60)
Glucose, Bld: 97 mg/dL (ref 65–99)
POTASSIUM: 4 mmol/L (ref 3.5–5.1)
Sodium: 134 mmol/L — ABNORMAL LOW (ref 135–145)
TOTAL PROTEIN: 7.6 g/dL (ref 6.5–8.1)
Total Bilirubin: 0.5 mg/dL (ref 0.3–1.2)

## 2016-04-09 LAB — CBC WITH DIFFERENTIAL/PLATELET
Basophils Absolute: 0 10*3/uL (ref 0–0.1)
Basophils Relative: 1 %
EOS PCT: 9 %
Eosinophils Absolute: 0.7 10*3/uL (ref 0–0.7)
HCT: 37.9 % (ref 35.0–47.0)
Hemoglobin: 12.9 g/dL (ref 12.0–16.0)
LYMPHS ABS: 1.2 10*3/uL (ref 1.0–3.6)
LYMPHS PCT: 14 %
MCH: 29.2 pg (ref 26.0–34.0)
MCHC: 34 g/dL (ref 32.0–36.0)
MCV: 86.1 fL (ref 80.0–100.0)
MONO ABS: 1 10*3/uL — AB (ref 0.2–0.9)
MONOS PCT: 12 %
Neutro Abs: 5.6 10*3/uL (ref 1.4–6.5)
Neutrophils Relative %: 64 %
PLATELETS: 291 10*3/uL (ref 150–440)
RBC: 4.4 MIL/uL (ref 3.80–5.20)
RDW: 14.4 % (ref 11.5–14.5)
WBC: 8.6 10*3/uL (ref 3.6–11.0)

## 2016-04-09 LAB — TSH: TSH: 0.241 u[IU]/mL — AB (ref 0.350–4.500)

## 2016-04-09 NOTE — Progress Notes (Signed)
Patient is here today for follow up, she does have a bite on her medial left ankle, has been on abx since last Friday and it seems to be getting worse. She would like a copy of her biopsy report that was done three weeks ago.

## 2016-04-09 NOTE — Progress Notes (Signed)
[facility] day:  04/09/2016  Chief Complaint: Laura Walters is a 82 y.o. female with thyroid carcinoma who is seen for reassessment.  HPI:  The patient was diagnosed with thyroid carcinoma in 08/2002.  She presented with a lump in her neck. She underwent thyroidectomy and lymph node dissection.  She believes 6 lymph nodes were positive (no pathology available).  She received I-131 in 10/2002.    She did well for 8 years .  Screening labs and imaging 2012 revealed recurrent disease.  CT scans in 01/2011 revealed lung nodules.  She received I-131 in 08/2011.   She has undergone follow-up imaging.   Chest CT on 12/05/2013 revealed similar to minimally enlarged pulmonary nodule suspicious for metastasis.  PET scan on 12/19/2013 revealed hypermetabolic nodal metastasis in the left surgical bed and left supraclavicular region. There were bilateral hypermetabolic pulmonary metastasis measuring up to 8 mm.  PET scan on 07/01/2014 revealed no substantial interval change in exam. PET scan on 12/23/2014 revealed decrease in hypermetabolism responding to the low left jugular and left thyroidectomy bed. There was a right posterior triangle node with low level hypermetabolism.  Pulmonary nodules were similar in size.  PET scan on 01/01/2016 revealed progressive disease, as evidenced by increased size and hypermetabolism of cervical nodes, thoracic nodes, and pulmonary nodules.  There was a foci of osseous hypermetabolism (indeterminate). Right posterior ninth rib and transverse process hypermetabolism could be posttraumatic, given suggestion of nondisplaced rib fracture. Right L3-4 hypermetabolism corresponded to extensive facet arthropathy (? degenerative).  I-131 scan on 02/16/2016 revealed no evidence of I-131 avid thyroid carcinoma.  The hypermetabolic lesions on comparison PET-CT scan either represented non I-131 avid thyroid carcinoma (poorly differentiated) versus  non thyroid cancer metastatic disease.  Ultrasound guided biopsy of the left supraclavicular lymph node on 03/24/2016 revealed metastatic papillary thyroid carcinoma.  Symptomatically, patient denies any respiratory symptoms. Her main complaint is back pain secondary to stenosis.  She receives injections in the pain clinic. She feels that her last injection was not successful. She goes to the pain clinic monthly.   Past Medical History  Diagnosis Date  . Arthritis   . Cardiomyopathy ([place])     idiopathic  . Hypertension   . Neuropathy ([place])   . DDD (degenerative disc disease), lumbar   . RAD (reactive airway disease)   . Chest pain     non cardiac  . Cancer of thyroid ([place]) 03/30/2015  . Urge incontinence   . IBS (irritable bowel syndrome)   . Seasonal allergies   . Hypothyroidism   . Nocturia   . Vaginal polyp   . Low back pain   . Cystocele   . Rectocele   . Pelvic pain in female   . Vaginal atrophy   . Vulvitis   . Constipation   . H/O wheezing     advair as needed  . Chokes     easily   swallow test negative    Past Surgical History  Procedure Laterality Date  . Pituitary surgery    . Thyroidectomy    . Hemorroidectomy      x 2  . Parotidectomy    . Dilation and curettage of uterus    . Left total hip arthroplasty    . Knee arthroscopy Right 05/19/2015    Procedure: Right knee arthrosocpy medail and lateral menisectomy, chondroplasty ;  Surgeon: Dereck Leep, MD;  Location: [facility] ORS;  Service: Orthopedics;  Laterality: Right;  .  Breast biopsy Right 86 and 92    2 bx-neg  . Abdominal hysterectomy    . Knee arthroplasty Right 01/05/2016    Procedure: COMPUTER ASSISTED TOTAL KNEE ARTHROPLASTY;  Surgeon: Dereck Leep, MD;  Location: [facility] ORS;  Service: Orthopedics;  Laterality: Right;    Family History  Problem Relation Age of Onset  . Heart disease Father   . Diabetes Paternal Uncle   . Cancer Neg Hx     Social History:  reports that she has never  smoked. She has never used smokeless tobacco. She reports that she does not drink alcohol or use illicit drugs.  The patient lives alone in [place]. The patient is accompanied by Her friend and caregiver, [name], today.  Allergies:  Allergies  Allergen Reactions  . Aleve [Naproxen]   . Codeine Hives and Itching  . Penicillins Hives  . Sulfa Antibiotics Hives    Current Medications: Current Outpatient Prescriptions  Medication Sig Dispense Refill  . carvedilol (COREG) 3.125 MG tablet Take 3.125 mg by mouth 2 (two) times daily with a meal.    . DULoxetine (CYMBALTA) 60 MG capsule Take 60 mg by mouth daily.    . DUREZOL 0.05 % EMUL     . ferrous sulfate 325 (65 FE) MG tablet Take 325 mg by mouth 2 (two) times daily with a meal.    . fexofenadine-pseudoephedrine (ALLEGRA-D 24) 180-240 MG 24 hr tablet 1 tablet daily. hs    . fluticasone (FLONASE) 50 MCG/ACT nasal spray Place into the nose.    Marland Kitchen Fluticasone-Salmeterol (ADVAIR DISKUS) 250-50 MCG/DOSE AEPB Inhale 1 puff into the lungs 2 (two) times daily.    . furosemide (LASIX) 20 MG tablet Take 40 mg by mouth 2 (two) times daily.     Marland Kitchen gabapentin (NEURONTIN) 400 MG capsule Take 800 mg by mouth 3 (three) times daily.     Marland Kitchen levofloxacin (LEVAQUIN) 250 MG tablet Take by mouth.    . levothyroxine (SYNTHROID, LEVOTHROID) 100 MCG tablet Take 100 mcg by mouth daily before breakfast.    . Multiple Vitamins-Minerals (PRESERVISION AREDS 2) CAPS Take 1 capsule by mouth 2 (two) times daily.    Marland Kitchen nystatin (MYCOSTATIN) powder Apply topically 2 (two) times daily as needed.     Marland Kitchen omeprazole (PRILOSEC) 40 MG capsule Take 40 mg by mouth daily.    . potassium chloride (K-DUR,KLOR-CON) 10 MEQ tablet Take 10 mEq by mouth once.    . traMADol (ULTRAM) 50 MG tablet Take 1-2 tablets (50-100 mg total) by mouth every 4 (four) hours as needed for moderate pain. 60 tablet 0   No current facility-administered medications for this visit.    Review of Systems:  GENERAL:   Feels "pretty good".  Active.  No fevers, sweats or weight loss. PERFORMANCE STATUS (ECOG):  1 HEENT:  Cataract surgery planned 04/2016.  No visual changes, runny nose, sore throat, mouth sores or tenderness. Lungs: No shortness of breath or cough.  No hemoptysis. Cardiac:  No chest pain, palpitations, orthopnea, or PND. GI:  Constipation.  No nausea, vomiting, diarrhea, melena or hematochezia. GU:  No urgency, frequency, dysuria, or hematuria. Musculoskeletal:  Chronic back pain secondary to stenosis.  Arthritis.  No muscle tenderness. Extremities:  No pain or swelling. Skin:  Infection on left ankle caused by a bug bite.  No rashes or skin changes. Neuro:  No headache, numbness or weakness, balance or coordination issues. Endocrine:  No diabetes.  On Synthroid s/p thyroidectomy for thyroid cancer.  No hot  flashes or night sweats. Psych:  No mood changes, depression or anxiety. Pain:  Back pain. Review of systems:  All other systems reviewed and found to be negative.  Physical Exam: Blood pressure 137/81, pulse 71, temperature 97.4 F (36.3 C), temperature source Tympanic, resp. rate 18, weight 133 lb 11.3 oz (60.65 kg). GENERAL:  Well developed, well nourished, woman sitting comfortably in the exam room in no acute distress.  She needs assistance onto exam table.  She has a cane at her side. MENTAL STATUS:  Alert and oriented to person, place and time. HEAD:  Short blonde/white hair.  Normocephalic, atraumatic, face symmetric, no Cushingoid features. EYES:  Blue eyes.  Pupils equal round and reactive to light and accomodation.  No conjunctivitis or scleral icterus. ENT:  Oropharynx clear without lesion.  Tongue normal. Mucous membranes dry.  RESPIRATORY:  Clear to auscultation without rales, wheezes or rhonchi. CARDIOVASCULAR:  Regular rate and rhythm without murmur, rub or gallop. ABDOMEN:  Soft, non-tender, with active bowel sounds, and no hepatosplenomegaly.  No masses. SKIN:  5.5 cm  x 3 cm ruddy oval area near left ankle without increased warmth or tenderness.  Small eschar in middle.  No rashes, ulcers or lesions. EXTREMITIES: No edema, no skin discoloration or tenderness.  No palpable cords. LYMPH NODES: No palpable cervical, supraclavicular, axillary or inguinal adenopathy  NEUROLOGICAL: Unremarkable. PSYCH:  Appropriate.  Appointment on 04/09/2016  Component Date Value Ref Range Status  . WBC 04/09/2016 8.6  3.6 - 11.0 K/uL Final  . RBC 04/09/2016 4.40  3.80 - 5.20 MIL/uL Final  . Hemoglobin 04/09/2016 12.9  12.0 - 16.0 g/dL Final  . HCT 04/09/2016 37.9  35.0 - 47.0 % Final  . MCV 04/09/2016 86.1  80.0 - 100.0 fL Final  . MCH 04/09/2016 29.2  26.0 - 34.0 pg Final  . MCHC 04/09/2016 34.0  32.0 - 36.0 g/dL Final  . RDW 04/09/2016 14.4  11.5 - 14.5 % Final  . Platelets 04/09/2016 291  150 - 440 K/uL Final  . Neutrophils Relative % 04/09/2016 64   Final  . Neutro Abs 04/09/2016 5.6  1.4 - 6.5 K/uL Final  . Lymphocytes Relative 04/09/2016 14   Final  . Lymphs Abs 04/09/2016 1.2  1.0 - 3.6 K/uL Final  . Monocytes Relative 04/09/2016 12   Final  . Monocytes Absolute 04/09/2016 1.0* 0.2 - 0.9 K/uL Final  . Eosinophils Relative 04/09/2016 9   Final  . Eosinophils Absolute 04/09/2016 0.7  0 - 0.7 K/uL Final  . Basophils Relative 04/09/2016 1   Final  . Basophils Absolute 04/09/2016 0.0  0 - 0.1 K/uL Final  . Sodium 04/09/2016 134* 135 - 145 mmol/L Final  . Potassium 04/09/2016 4.0  3.5 - 5.1 mmol/L Final  . Chloride 04/09/2016 97* 101 - 111 mmol/L Final  . CO2 04/09/2016 29  22 - 32 mmol/L Final  . Glucose, Bld 04/09/2016 97  65 - 99 mg/dL Final  . BUN 04/09/2016 21* 6 - 20 mg/dL Final  . Creatinine, Ser 04/09/2016 1.02* 0.44 - 1.00 mg/dL Final  . Calcium 04/09/2016 8.4* 8.9 - 10.3 mg/dL Final  . Total Protein 04/09/2016 7.6  6.5 - 8.1 g/dL Final  . Albumin 04/09/2016 4.0  3.5 - 5.0 g/dL Final  . AST 04/09/2016 29  15 - 41 U/L Final  . ALT 04/09/2016 16  14 - 54  U/L Final  . Alkaline Phosphatase 04/09/2016 129* 38 - 126 U/L Final  . Total Bilirubin 04/09/2016 0.5  0.3 - 1.2 mg/dL Final  . GFR calc non Af Amer 04/09/2016 51* >60 mL/min Final  . GFR calc Af Amer 04/09/2016 59* >60 mL/min Final   Comment: (NOTE) The eGFR has been calculated using the CKD EPI equation. This calculation has not been validated in all clinical situations. eGFR's persistently <60 mL/min signify possible Chronic Kidney Disease.   . Anion gap 04/09/2016 8  5 - 15 Final    Assessment:  Laura Walters is a 82 y.o. female with a recurrent thyroid cancer.  She was diagnosed with thyroid carcinoma in 08/2002.  She underwent thyroidectomy and lymph node dissection.  She received I-131 in 10/2002.    Her thyroid cancer recurred in 2012. CT scans in 01/2011 revealed lung nodules.  She received I-131 in 08/2011.  Follow-up scans revealed stable disease.  PET scan on 01/01/2016 revealed progressive disease, as evidenced by increased size and hypermetabolism of cervical nodes, thoracic nodes, and pulmonary nodules.  There was a foci of osseous hypermetabolism (indeterminate). Right posterior ninth rib and transverse process hypermetabolism could be posttraumatic, given suggestion of nondisplaced rib fracture.  I-131 scan on 02/16/2016 revealed no evidence of I-131 avid thyroid carcinoma.  Ultrasound guided biopsy of the left supraclavicular lymph node on 03/24/2016 revealed metastatic papillary thyroid carcinoma.  Symptomatically, she denies any respiratory symptoms. She has chronic back pain secondary to stenosis.   Plan: 1.  Discuss entire medical history. Discuss diagnosis and management of her thyroid cancer. As recent I-131 scan documented non-avid I-131 thyroid cancer, no role for I-131 therapy. Discuss recent PET scan revealing active disease.  Discuss option of observation versus treatment. Discussed observation given her age and asymptomatic metastatic disease with nodules less  than 1-2 cm area and disease growth less than 20% per year.  Discuss plan for follow-up imaging every 6 months.  Discuss potential treatment options with tyrosine kinase inhibitors (TKI) and VEGF inhibitors. Sorafenib and lenvatinib were reviewed.  Information was provided.  2.  Schedule chest CT with no contrast on 07/02/2016. 3.  Anticipate next PET scan on 12/31/2016. 4.  RTC after CT scan for MD assessment, labs (CBC with diff, CMP, TSH, T4, thyroglobulin), and review of chest CT.   Lequita Asal, MD  04/09/2016, 11:19 AM

## 2016-04-09 NOTE — Patient Instructions (Signed)
Sorafenib Oral Tablet What is this medicine? SORAFENIB (soe RAF e nib) is a medicine that targets proteins in cancer cells and stops the cancer cells from growing. It is used to treat liver cancer, kidney cancer, and thyroid cancer. This medicine may be used for other purposes; ask your health care provider or pharmacist if you have questions. What should I tell my health care provider before I take this medicine? They need to know if you have any of these conditions: -bleeding problems -heart disease -high blood pressure -kidney disease -liver disease -lung cancer -recent surgery -an unusual or allergic reaction to sorafenib, other medicines, foods, dyes, or preservatives -pregnant or trying to get pregnant -breast-feeding How should I use this medicine? Take this medicine by mouth with a glass of water. Follow the directions on the prescription label. Do not cut, crush or chew this medicine. Take this medicine on an empty stomach, at least 1 hour before or 2 hours after meals. Do not take with food. Take your medicine at regular intervals. Do not take it more often than directed. Do not stop taking except on your doctor's advice. Talk to your pediatrician regarding the use of this medicine in children. Special care may be needed. Overdosage: If you think you have taken too much of this medicine contact a poison control center or emergency room at once. NOTE: This medicine is only for you. Do not share this medicine with others. What if I miss a dose? If you miss a dose, take it as soon as you can. If it is almost time for your next dose, take only that dose. Do not take double or extra doses. What may interact with this medicine? This medicine may interact with the following medications: -carbamazepine -dexamethasone -medicines for seizures like carbamazepine, phenobarbital, phenytoin -neomycin -rifabutin -rifampin -St. John's Wort -warfarin This list may not describe all possible  interactions. Give your health care provider a list of all the medicines, herbs, non-prescription drugs, or dietary supplements you use. Also tell them if you smoke, drink alcohol, or use illegal drugs. Some items may interact with your medicine. What should I watch for while using this medicine? This drug may make you feel generally unwell. This is not uncommon, as chemotherapy can affect healthy cells as well as cancer cells. Report any side effects. Continue your course of treatment even though you feel ill unless your doctor tells you to stop. Men and women should use effective birth control while taking this medicine and for 2 weeks after stopping this medicine. Do not become pregnant while taking this medicine. Women should inform their doctor if they wish to become pregnant or think they might be pregnant. There is a potential for serious side effects to an unborn child. Talk to your health care professional or pharmacist for more information. Do not breast-feed an infant while taking this medicine. If you are going to have surgery or any other procedures, tell your doctor you are taking this medicine. What side effects may I notice from receiving this medicine? Side effects that you should report to your doctor or health care professional as soon as possible: -allergic reactions like skin rash, itching or hives, swelling of the face, lips, or tongue -black, tarry stools -breathing problems -chest pain or chest tightness -dark urine -dizziness -fast or irregular heartbeat -feeling faint or lightheaded -high fever -light-colored stools -nausea, vomiting -redness, blistering, peeling or loosening of the skin, including inside the mouth -right upper belly pain -sores on the hands  or feet -spitting up blood or brown material that looks like coffee grounds -stomach pain -yellowing of the eyes or skinSide effects that usually do not require medical attention (report to your doctor or health  care professional if they continue or are bothersome): -diarrhea -hair loss -loss of appetite -tiredness -weight loss This list may not describe all possible side effects. Call your doctor for medical advice about side effects. You may report side effects to FDA at 1-800-FDA-1088. Where should I keep my medicine? Keep out of the reach of children. Store at room temperature between 15 and 30 degrees C (59 and 86 degrees F). Protect from moisture. Throw away any unused medicine after the expiration date. NOTE: This sheet is a summary. It may not cover all possible information. If you have questions about this medicine, talk to your doctor, pharmacist, or health care provider.    2016, Elsevier/Gold Standard. (2014-11-12 14:39:58) Lenvatinib oral capsule What is this medicine? LENVATINIB (len VA ti nib) is a medicine that targets proteins in cancer cells and stops the cancer cells from growing. It is used to treat thyroid cancer and kidney cancer. This medicine may be used for other purposes; ask your health care provider or pharmacist if you have questions. What should I tell my health care provider before I take this medicine? They need to know if you have any of these conditions: -diabetes -high blood pressure -heart disease -history of blood clots -history of a drug or alcohol abuse problem -history of irregular heartbeat -history of low levels of calcium, magnesium, or potassium in the blood -inflammatory bowel disease -kidney disease -liver disease -protein in your urine -an unusual or allergic reaction to lenvatinib, other medicines, foods, dyes, or preservatives -pregnant or trying to get pregnant -breast-feeding How should I use this medicine? Take this medicine by mouth with a glass of water. Follow the directions on the prescription label. Swallow the capsules whole. Do not cut, crush or chew this medicine. Take your medicine at regular intervals. Do not take it more often  than directed. Do not stop taking except on your doctor's advice. Talk to your pediatrician regarding the use of this medicine in children. Special care may be needed. Overdosage: If you think you have taken too much of this medicine contact a poison control center or emergency room at once. NOTE: This medicine is only for you. Do not share this medicine with others. What if I miss a dose? If you miss a dose, take it as soon as you can. If your next dose is to be taken in less than 12 hours, then do not take the missed dose. Take the next dose at your regular time. Do not take double or extra doses. What may interact with this medicine? Do not take this medicine with any of the following medications: -cisapride -dofetilide -dronedarone -pimozide -thioridazine -ziprasidone This medicine may also interact with the following medications: -alfuzosin -bedaquiline -certain antibiotics like azithromycin, clarithromycin or erythromycin -certain medicines for bladder problems like solifenacin, tolterodine -certain medicines for depression, anxiety, or psychotic disturbances -certain medicines for fungal infections like ketoconazole, posaconazole, voriconazole, fluconazole, and itraconazole -certain medicines for irregular heart beat like amiodarone, dofetilide, flecainide, propafenone, quinidine -chloroquine -ciprofloxacin -cyclobenzaprine -ezogabine -fingolimod -granisetron -leuprolide -ritonavir -lopinavir; ritonavir -methadone -metronidazole -mifepristone -octreotide -ondansetron -other medicines that prolong the QT interval (cause an abnormal heart rhythm) -pasireotide -pentamidine -promethazine -quinine -ranolazine -rifampin -rilpivirine -romidepsin -saquinavir -tacrolimus -telavancin -telithromycin -tetrabenazine -tizanidine -toremifene -vardenafil -vorinostat This list may not describe all possible  interactions. Give your health care provider a list of all the  medicines, herbs, non-prescription drugs, or dietary supplements you use. Also tell them if you smoke, drink alcohol, or use illegal drugs. Some items may interact with your medicine. What should I watch for while using this medicine? Visit your doctor for regular check ups. Report any side effects. Continue your course of treatment unless your doctor tells you to stop. You will need blood work done while you are taking this medicine. Do not become pregnant while taking this medicine or for 2 weeks after stopping it. Women should inform their doctor if they wish to become pregnant or think they might be pregnant. There is a potential for serious side effects to an unborn child. Talk to your health care professional or pharmacist for more information. Do not breast-feed an infant while taking this medicine. Be careful brushing and flossing your teeth or using a toothpick because you may get an infection or bleed more easily. If you have any dental work done, tell your dentist you are receiving this medicine. This medicine may increase your risk to bruise or bleed. Call your doctor or health care professional if you notice any unusual bleeding. This drug may make you feel generally unwell. This is not uncommon, as chemotherapy can affect healthy cells as well as cancer cells. Report any side effects. Continue your course of treatment even though you feel ill unless your doctor tells you to stop. This medicine may interfere with the ability to have a child. You should talk with your doctor or health care professional if you are concerned about your fertility. What side effects may I notice from receiving this medicine? Side effects that you should report to your doctor or health care professional as soon as possible: -allergic reactions like skin rash, itching or hives, swelling of the face, lips, or tongue -breathing problems -chest pain or palpitations -dizziness -feeling faint or lightheaded,  falls -headache -high blood pressure -seizures -signs and symptoms of bleeding such as bloody or black, tarry stools; red or dark-brown urine; spitting up blood or brown material that looks like coffee grounds; red spots on the skin; unusual bruising or bleeding from the eye, gums, or nose -signs and symptoms of a dangerous change in heartbeat or heart rhythm like chest pain; dizziness; fast or irregular heartbeat; palpitations; feeling faint or lightheaded, falls; breathing problems -signs and symptoms of kidney injury like trouble passing urine or change in the amount of urine -signs and symptoms of liver injury like dark yellow or brown urine; general ill feeling or flu-like symptoms; light-colored stools; loss of appetite; nausea; right upper belly pain; unusually weak or tired; yellowing of the eyes or skin -signs and symptoms of low potassium like muscle cramps or muscle pain; chest pain; dizziness; feeling faint or lightheaded, falls; palpitations; breathing problems; or fast, irregular heartbeat -signs and symptoms of a stroke like changes in vision; confusion; trouble speaking or understanding; severe headaches; sudden numbness or weakness of the face, arm or leg; trouble walking; dizziness; loss of balance or coordination -stomach pain -swelling of the legs or ankles -unusually weak or tired Side effects that usually do not require medical attention (Report these to your doctor or health care professional if they continue or are bothersome.): -diarrhea -joint pain -loss of appetite -mouth sores -muscle pain -nausea, vomiting -weight loss This list may not describe all possible side effects. Call your doctor for medical advice about side effects. You may report side effects to  FDA at 1-800-FDA-1088. Where should I keep my medicine? Keep out of the reach of children. Store between 20 and 25 degrees C (68 and 77 degrees F). Throw away any unused medicine after the expiration  date. NOTE: This sheet is a summary. It may not cover all possible information. If you have questions about this medicine, talk to your doctor, pharmacist, or health care provider.    2016, Elsevier/Gold Standard. (2015-02-14 13:27:52)

## 2016-04-10 LAB — T4: T4 TOTAL: 6.9 ug/dL (ref 4.5–12.0)

## 2016-04-14 LAB — THYROGLOBULIN LEVEL

## 2016-04-22 ENCOUNTER — Other Ambulatory Visit: Payer: Medicare Other

## 2016-04-22 ENCOUNTER — Ambulatory Visit: Payer: Medicare Other | Admitting: Oncology

## 2016-04-22 ENCOUNTER — Ambulatory Visit (INDEPENDENT_AMBULATORY_CARE_PROVIDER_SITE_OTHER): Payer: Medicare Other | Admitting: Obstetrics and Gynecology

## 2016-04-22 ENCOUNTER — Encounter: Payer: Self-pay | Admitting: Obstetrics and Gynecology

## 2016-04-22 VITALS — BP 113/64 | HR 76 | Ht 60.0 in | Wt 132.2 lb

## 2016-04-22 DIAGNOSIS — N390 Urinary tract infection, site not specified: Secondary | ICD-10-CM | POA: Diagnosis not present

## 2016-04-22 DIAGNOSIS — N8110 Cystocele, unspecified: Secondary | ICD-10-CM

## 2016-04-22 DIAGNOSIS — R351 Nocturia: Secondary | ICD-10-CM | POA: Diagnosis not present

## 2016-04-22 DIAGNOSIS — Z4689 Encounter for fitting and adjustment of other specified devices: Secondary | ICD-10-CM

## 2016-04-22 DIAGNOSIS — IMO0002 Reserved for concepts with insufficient information to code with codable children: Secondary | ICD-10-CM

## 2016-04-22 DIAGNOSIS — N816 Rectocele: Secondary | ICD-10-CM

## 2016-04-22 LAB — POCT URINALYSIS DIPSTICK
Bilirubin, UA: NEGATIVE
Blood, UA: NEGATIVE
Glucose, UA: NEGATIVE
Ketones, UA: NEGATIVE
NITRITE UA: NEGATIVE
PH UA: 7.5
PROTEIN UA: NEGATIVE
Spec Grav, UA: <=1.005
Urobilinogen, UA: 0.2

## 2016-04-22 NOTE — Patient Instructions (Signed)
1. Continue Trimosan gel intravaginal weekly 2. Return in 8 weeks for pessary maintenance

## 2016-04-22 NOTE — Progress Notes (Signed)
Chief complaint: 1. Pessary maintenance 2. Pelvic organ prolapse  Patient presents for 7 week follow-up on pessary maintenance. She does not report any significant vaginal bleeding, vaginal discharge, odor, or pelvic pain. She is using Trimosan gel weekly. She does report increased frequency of urination at night without dysuria  Past medical history, past surgical history, problem list, medications, and allergies are reviewed  OBJECTIVE: BP 113/64   Pulse 76   Ht 5' (1.524 m)   Wt 132 lb 3.2 oz (60 kg)   BMI 25.82 kg/m  Pleasant, well-appearing female in no acute distress. Limited mobility; right knee replacement scar is healing well Abdomen: Soft, nontender. Pelvic exam: External genitalia-atrophic. BUS-urethral caruncle present.. Vagina-third degree cystocele; moderate rectocele; vaginal enterocele. No evidence of discharge.  Cervix-no lesions Uterus-surgically absent  PROCEDURE: Pessary is removed, cleaned, and reinserted  ASSESSMENT: 1. Pelvic organ prolapse with good response to gel horn pessary 2. Normal pessary maintenance 3. Nocturia  PLAN: 1. Continue Trimosan gel intravaginal weekly 2. Return in 8 weeks for pessary maintenance 3. Urinalysis with culture  A total of 15 minutes were spent face-to-face with the patient during this encounter and over half of that time dealt with counseling and coordination of care.  Brayton Mars, MD  Note: This dictation was prepared with Dragon dictation along with smaller phrase technology. Any transcriptional errors that result from this process are unintentional.

## 2016-04-24 LAB — URINE CULTURE

## 2016-05-11 ENCOUNTER — Telehealth: Payer: Self-pay

## 2016-05-11 MED ORDER — NITROFURANTOIN MONOHYD MACRO 100 MG PO CAPS: 100.0000 mg | ORAL_CAPSULE | Freq: Two times a day (BID) | ORAL | 0 refills | Status: DC

## 2016-05-11 NOTE — Telephone Encounter (Signed)
Med erx. Pt aware.  

## 2016-05-11 NOTE — Telephone Encounter (Signed)
-----   Message from Brayton Mars, MD sent at 05/11/2016  6:48 AM EDT ----- Please notify - Abnormal Labs Call in [place] 2x/daily for 7 days.

## 2016-05-12 ENCOUNTER — Encounter: Payer: Self-pay | Admitting: *Deleted

## 2016-05-18 NOTE — H&P (Signed)
  See scanned notes. 

## 2016-05-19 ENCOUNTER — Encounter
Admission: RE | Disposition: A | Discharge status: Home / Self Care | Payer: Self-pay | Source: Ambulatory Visit | Attending: Ophthalmology

## 2016-05-19 ENCOUNTER — Ambulatory Visit
Admission: RE | Admit: 2016-05-19 | Discharge: 2016-05-19 | Disposition: A | Discharge status: Home / Self Care | Payer: Medicare Other | Source: Ambulatory Visit | Attending: Ophthalmology | Admitting: Ophthalmology

## 2016-05-19 ENCOUNTER — Ambulatory Visit: Payer: Medicare Other | Admitting: Anesthesiology

## 2016-05-19 ENCOUNTER — Encounter: Payer: Self-pay | Admitting: *Deleted

## 2016-05-19 DIAGNOSIS — G629 Polyneuropathy, unspecified: Secondary | ICD-10-CM | POA: Diagnosis not present

## 2016-05-19 DIAGNOSIS — Z96659 Presence of unspecified artificial knee joint: Secondary | ICD-10-CM | POA: Insufficient documentation

## 2016-05-19 DIAGNOSIS — K219 Gastro-esophageal reflux disease without esophagitis: Secondary | ICD-10-CM | POA: Insufficient documentation

## 2016-05-19 DIAGNOSIS — M549 Dorsalgia, unspecified: Secondary | ICD-10-CM | POA: Diagnosis not present

## 2016-05-19 DIAGNOSIS — G8929 Other chronic pain: Secondary | ICD-10-CM | POA: Diagnosis not present

## 2016-05-19 DIAGNOSIS — M4800 Spinal stenosis, site unspecified: Secondary | ICD-10-CM | POA: Diagnosis not present

## 2016-05-19 DIAGNOSIS — J45909 Unspecified asthma, uncomplicated: Secondary | ICD-10-CM | POA: Insufficient documentation

## 2016-05-19 DIAGNOSIS — Z880 Allergy status to penicillin: Secondary | ICD-10-CM | POA: Diagnosis not present

## 2016-05-19 DIAGNOSIS — E039 Hypothyroidism, unspecified: Secondary | ICD-10-CM | POA: Diagnosis not present

## 2016-05-19 DIAGNOSIS — Z882 Allergy status to sulfonamides status: Secondary | ICD-10-CM | POA: Diagnosis not present

## 2016-05-19 DIAGNOSIS — H2511 Age-related nuclear cataract, right eye: Secondary | ICD-10-CM | POA: Insufficient documentation

## 2016-05-19 DIAGNOSIS — Z85850 Personal history of malignant neoplasm of thyroid: Secondary | ICD-10-CM | POA: Diagnosis not present

## 2016-05-19 DIAGNOSIS — Z96649 Presence of unspecified artificial hip joint: Secondary | ICD-10-CM | POA: Diagnosis not present

## 2016-05-19 DIAGNOSIS — M810 Age-related osteoporosis without current pathological fracture: Secondary | ICD-10-CM | POA: Diagnosis not present

## 2016-05-19 DIAGNOSIS — M542 Cervicalgia: Secondary | ICD-10-CM | POA: Insufficient documentation

## 2016-05-19 DIAGNOSIS — I429 Cardiomyopathy, unspecified: Secondary | ICD-10-CM | POA: Diagnosis not present

## 2016-05-19 HISTORY — PX: CATARACT EXTRACTION W/PHACO: SHX586

## 2016-05-19 HISTORY — DX: Spinal stenosis, site unspecified: M48.00

## 2016-05-19 HISTORY — DX: Gastro-esophageal reflux disease without esophagitis: K21.9

## 2016-05-19 HISTORY — DX: Unspecified asthma, uncomplicated: J45.909

## 2016-05-19 HISTORY — DX: Unspecified thoracic, thoracolumbar and lumbosacral intervertebral disc disorder: M51.9

## 2016-05-19 HISTORY — DX: Dorsalgia, unspecified: M54.9

## 2016-05-19 SURGERY — PHACOEMULSIFICATION, CATARACT, WITH IOL INSERTION
Anesthesia: Monitor Anesthesia Care | Site: Eye | Laterality: Right | Wound class: Clean

## 2016-05-19 MED ORDER — POVIDONE-IODINE 5 % OP SOLN
OPHTHALMIC | Status: AC
  Filled 2016-05-19: qty 30

## 2016-05-19 MED ORDER — CARBACHOL 0.01 % IO SOLN
INTRAOCULAR | Status: DC | PRN
  Administered 2016-05-19: 0.5 mL via INTRAOCULAR

## 2016-05-19 MED ORDER — EPINEPHRINE HCL 1 MG/ML IJ SOLN
INTRAMUSCULAR | Status: AC
  Filled 2016-05-19: qty 2

## 2016-05-19 MED ORDER — TETRACAINE HCL 0.5 % OP SOLN
OPHTHALMIC | Status: AC
  Filled 2016-05-19: qty 2

## 2016-05-19 MED ORDER — CEFUROXIME OPHTHALMIC INJECTION 1 MG/0.1 ML
INJECTION | OPHTHALMIC | Status: AC
  Filled 2016-05-19: qty 0.1

## 2016-05-19 MED ORDER — PHENYLEPHRINE HCL 10 % OP SOLN
1.0000 [drp] | OPHTHALMIC | Status: AC
  Administered 2016-05-19 (×4): 1 [drp] via OPHTHALMIC

## 2016-05-19 MED ORDER — CYCLOPENTOLATE HCL 2 % OP SOLN
1.0000 [drp] | OPHTHALMIC | Status: AC
  Administered 2016-05-19 (×4): 1 [drp] via OPHTHALMIC

## 2016-05-19 MED ORDER — HYALURONIDASE HUMAN 150 UNIT/ML IJ SOLN
INTRAMUSCULAR | Status: AC
  Filled 2016-05-19: qty 1

## 2016-05-19 MED ORDER — LIDOCAINE HCL (PF) 4 % IJ SOLN
INTRAMUSCULAR | Status: DC | PRN
  Administered 2016-05-19: 4 mL via OPHTHALMIC

## 2016-05-19 MED ORDER — ALFENTANIL 500 MCG/ML IJ INJ
INJECTION | INTRAMUSCULAR | Status: DC | PRN
  Administered 2016-05-19: 250 ug via INTRAVENOUS

## 2016-05-19 MED ORDER — LIDOCAINE HCL (PF) 4 % IJ SOLN
INTRAMUSCULAR | Status: DC | PRN
  Administered 2016-05-19: 5 mL via OPHTHALMIC

## 2016-05-19 MED ORDER — MIDAZOLAM HCL 2 MG/2ML IJ SOLN
INTRAMUSCULAR | Status: DC | PRN
  Administered 2016-05-19: 1 mg via INTRAVENOUS

## 2016-05-19 MED ORDER — POVIDONE-IODINE 5 % OP SOLN
OPHTHALMIC | Status: DC | PRN
  Administered 2016-05-19: 1 via OPHTHALMIC

## 2016-05-19 MED ORDER — ONDANSETRON HCL 4 MG/2ML IJ SOLN
INTRAMUSCULAR | Status: DC | PRN
  Administered 2016-05-19: 4 mg via INTRAVENOUS

## 2016-05-19 MED ORDER — TETRACAINE HCL 0.5 % OP SOLN
OPHTHALMIC | Status: DC | PRN
  Administered 2016-05-19: 2 [drp] via OPHTHALMIC

## 2016-05-19 MED ORDER — BUPIVACAINE HCL (PF) 0.75 % IJ SOLN
INTRAMUSCULAR | Status: AC
  Filled 2016-05-19: qty 10

## 2016-05-19 MED ORDER — MOXIFLOXACIN HCL 0.5 % OP SOLN
OPHTHALMIC | Status: AC
  Administered 2016-05-19: 1 [drp] via OPHTHALMIC
  Filled 2016-05-19: qty 3

## 2016-05-19 MED ORDER — MOXIFLOXACIN HCL 0.5 % OP SOLN
OPHTHALMIC | Status: DC | PRN
  Administered 2016-05-19: 5 [drp] via OPHTHALMIC

## 2016-05-19 MED ORDER — NA CHONDROIT SULF-NA HYALURON 40-17 MG/ML IO SOLN
INTRAOCULAR | Status: AC
  Filled 2016-05-19: qty 1

## 2016-05-19 MED ORDER — NA CHONDROIT SULF-NA HYALURON 40-17 MG/ML IO SOLN
INTRAOCULAR | Status: DC | PRN
  Administered 2016-05-19: 1 mL via INTRAOCULAR

## 2016-05-19 MED ORDER — LIDOCAINE HCL (PF) 4 % IJ SOLN
INTRAMUSCULAR | Status: AC
  Filled 2016-05-19: qty 5

## 2016-05-19 MED ORDER — PHENYLEPHRINE HCL 10 % OP SOLN
OPHTHALMIC | Status: AC
  Administered 2016-05-19: 1 [drp] via OPHTHALMIC
  Filled 2016-05-19: qty 5

## 2016-05-19 MED ORDER — SODIUM CHLORIDE 0.9 % IV SOLN
INTRAVENOUS | Status: DC
  Administered 2016-05-19 (×2): via INTRAVENOUS

## 2016-05-19 MED ORDER — CYCLOPENTOLATE HCL 2 % OP SOLN
OPHTHALMIC | Status: AC
  Administered 2016-05-19: 1 [drp] via OPHTHALMIC
  Filled 2016-05-19: qty 2

## 2016-05-19 MED ORDER — MOXIFLOXACIN HCL 0.5 % OP SOLN
1.0000 [drp] | OPHTHALMIC | Status: AC
  Administered 2016-05-19 (×3): 1 [drp] via OPHTHALMIC

## 2016-05-19 MED ORDER — EPINEPHRINE HCL 1 MG/ML IJ SOLN
INTRAOCULAR | Status: DC | PRN
  Administered 2016-05-19: 200 mL via OPHTHALMIC

## 2016-05-19 SURGICAL SUPPLY — 29 items

## 2016-05-19 NOTE — Anesthesia Procedure Notes (Signed)
Date/Time: 05/19/2016 8:50 AM Performed by: Nelda Marseille Pre-anesthesia Checklist: Patient identified, Emergency Drugs available, Suction available, Patient being monitored and Timeout performed Oxygen Delivery Method: Nasal cannula

## 2016-05-19 NOTE — Transfer of Care (Signed)
Immediate Anesthesia Transfer of Care Note  Patient: Laura Walters  Procedure(s) Performed: Procedure(s) with comments: CATARACT EXTRACTION PHACO AND INTRAOCULAR LENS PLACEMENT ([place]) (Right) - Lot# [id number] H [place]:   1:25.3 AP%:  24.9% CDE:  40.11  Patient Location: PACU  Anesthesia Type:MAC  Level of Consciousness: awake, alert  and oriented  Airway & Oxygen Therapy: Patient Spontanous Breathing  Post-op Assessment: Report given to RN and Post -op Vital signs reviewed and stable  Post vital signs: Reviewed and stable  Last Vitals:  Vitals:   05/12/16 1254 05/19/16 0654  BP: 112/63 (!) 155/73  Pulse: 75 68  Resp:  16  Temp:  36.5 C    Last Pain:  Vitals:   05/19/16 0654  TempSrc: Tympanic  PainSc: 2          Complications: No apparent anesthesia complications

## 2016-05-19 NOTE — OR Nursing (Signed)
Patient reports having a fall on Saturday and she has bruised chin and soreness around left rib area. She reports she is doing better today. Dr. Sandra Cockayne aware.

## 2016-05-19 NOTE — Anesthesia Postprocedure Evaluation (Signed)
Anesthesia Post Note  Patient: Laura Walters  Procedure(s) Performed: Procedure(s) (LRB): CATARACT EXTRACTION PHACO AND INTRAOCULAR LENS PLACEMENT ([facility]) (Right)  Patient location during evaluation: PACU Anesthesia Type: MAC Level of consciousness: awake, awake and alert and oriented Pain management: pain level not controlled Vital Signs Assessment: post-procedure vital signs reviewed and stable Respiratory status: spontaneous breathing, nonlabored ventilation and respiratory function stable Cardiovascular status: blood pressure returned to baseline and stable Postop Assessment: no headache Anesthetic complications: no    Last Vitals:  Vitals:   05/12/16 1254 05/19/16 0654  BP: 112/63 (!) 155/73  Pulse: 75 68  Resp:  16  Temp:  36.5 C    Last Pain:  Vitals:   05/19/16 0654  TempSrc: Tympanic  PainSc: 2                  Lashanna Angelo,  Eliyah Mcshea R

## 2016-05-19 NOTE — Interval H&P Note (Signed)
History and Physical Interval Note:  05/19/2016 7:31 AM  Laura Walters  has presented today for surgery, with the diagnosis of CATARACT  The various methods of treatment have been discussed with the patient and family. After consideration of risks, benefits and other options for treatment, the patient has consented to  Procedure(s): CATARACT EXTRACTION PHACO AND INTRAOCULAR LENS PLACEMENT ([place]) (Right) as a surgical intervention .  The patient's history has been reviewed, patient examined, no change in status, stable for surgery.  I have reviewed the patient's chart and labs.  Questions were answered to the patient's satisfaction.     Royalti Schauf

## 2016-05-19 NOTE — Op Note (Signed)
Date of Surgery: 05/19/2016 Date of Dictation: 05/19/2016 9:09 AM Pre-operative Diagnosis:  Nuclear Sclerotic Cataract right Eye Post-operative Diagnosis: same Procedure performed: Extra-capsular Cataract Extraction (ECCE) with placement of a posterior chamber intraocular lens (IOL) right Eye IOL:  Implant Name Type Inv. Item Serial No. Manufacturer Lot No. LRB No. Used  LENS IOL ACRYSOF IQ 20.0 - [id number] Intraocular Lens LENS IOL ACRYSOF IQ 20.0 [id number] ALCON [id number] Right 1   Anesthesia: 2% Lidocaine and 4% Marcaine in a 50/50 mixture with 10 unites/ml of Hylenex given as a peribulbar Anesthesiologist: Anesthesiologist: Gunnar Bulla, MD CRNA: Nelda Marseille, CRNA; Johnna Acosta, CRNA Complications: none Estimated Blood Loss: less than 1 ml  Description of procedure:  The patient was given anesthesia and sedation via intravenous access. The patient was then prepped and draped in the usual fashion. A 25-gauge needle was bent for initiating the capsulorhexis. A 5-0 silk suture was placed through the conjunctiva superior and inferiorly to serve as bridle sutures. Hemostasis was obtained at the superior limbus using an eraser cautery. A partial thickness groove was made at the anterior surgical limbus with a 64 Beaver blade and this was dissected anteriorly with an [employer]aya. The anterior chamber was entered at 10 o'clock with a 1.0 mm paracentesis knife and through the lamellar dissection with a 2.6 mm Alcon keratome. Epi-Shugarcaine 0.5 CC [9 cc BSS Plus (Alcon), 3 cc 4% preservative-free lidocaine (Hospira) and 4 cc 1:1000 preservative-free, bisulfite-free epinephrine] was injected into the anterior chamber via the paracentesis tract. Epi-Shugarcaine 0.5 CC [9 cc BSS Plus (Alcon), 3 cc 4% preservative-free lidocaine (Hospira) and 4 cc 1:1000 preservative-free, bisulfite-free epinephrine] was injected into the anterior chamber via the paracentesis tract. DiscoVisc was  injected to replace the aqueous and a continuous tear curvilinear capsulorhexis was performed using a bent 25-gauge needle.  Balance salt on a syringe was used to perform hydro-dissection and phacoemulsification was carried out using a divide and conquer technique. Procedure(s) with comments: CATARACT EXTRACTION PHACO AND INTRAOCULAR LENS PLACEMENT ([facility]) (Right) - Lot# [id number] H [place]:   1:25.3 AP%:  24.9% CDE:  40.11. Irrigation/aspiration was used to remove the residual cortex and the capsular bag was inflated with DiscoVisc. The intraocular lens was inserted into the capsular bag using a pre-loaded UltraSert Delivery System. Irrigation/aspiration was used to remove the residual DiscoVisc. The wound was inflated with balanced salt and checked for leaks. None were found. Miostat was injected via the paracentesis track and .05cc of Vigamox was injected via the paracentesis track. The wound was checked for leaks again and none were found.   The bridal sutures were removed and two drops of Vigamox were placed on the eye. An eye shield was placed to protect the eye and the patient was discharged to the recovery area in good condition.   Karynn Deblasi MD

## 2016-05-19 NOTE — Interval H&P Note (Signed)
History and Physical Interval Note:  05/19/2016 7:30 AM  Laura Walters  has presented today for surgery, with the diagnosis of CATARACT  The various methods of treatment have been discussed with the patient and family. After consideration of risks, benefits and other options for treatment, the patient has consented to  Procedure(s): CATARACT EXTRACTION PHACO AND INTRAOCULAR LENS PLACEMENT ([place]) (Right) as a surgical intervention .  The patient's history has been reviewed, patient examined, no change in status, stable for surgery.  I have reviewed the patient's chart and labs.  Questions were answered to the patient's satisfaction.     [name]

## 2016-05-19 NOTE — Discharge Instructions (Addendum)
See handout.Eye Surgery Discharge Instructions ° °Expect mild scratchy sensation or mild soreness. °DO NOT RUB YOUR EYE! ° °The day of surgery: °• Minimal physical activity, but bed rest is not required °• No reading, computer work, or close hand work °• No bending, lifting, or straining. °• May watch TV ° °For 24 hours: °• No driving, legal decisions, or alcoholic beverages °• Safety precautions °• Eat anything you prefer: It is better to start with liquids, then soup then solid foods. °• _____ Eye patch should be worn until postoperative exam tomorrow. °• ____ Solar shield eyeglasses should be worn for comfort in the sunlight/patch while sleeping ° °Resume all regular medications including aspirin or Coumadin if these were discontinued prior to surgery. °You may shower, bathe, shave, or wash your hair. °Tylenol may be taken for mild discomfort. ° °Call your doctor if you experience significant pain, nausea, or vomiting, fever > 101 or other signs of infection. [phone] or [phone] °Specific instructions: ° ° °

## 2016-05-19 NOTE — Anesthesia Preprocedure Evaluation (Signed)
Anesthesia Evaluation  Patient identified by MRN, date of birth, ID band Patient awake    Reviewed: Allergy & Precautions, NPO status , Patient's Chart, lab work & pertinent test results, reviewed documented beta blocker date and time   Airway Mallampati: II  TM Distance: >3 FB     Dental  (+) Chipped   Pulmonary asthma ,           Cardiovascular hypertension, Pt. on medications and Pt. on home beta blockers      Neuro/Psych  Neuromuscular disease    GI/Hepatic GERD  Controlled,  Endo/Other  Hypothyroidism   Renal/GU      Musculoskeletal  (+) Arthritis ,   Abdominal   Peds  Hematology   Anesthesia Other Findings Lung mets. IBS. Hx of thyroid Ca and pituitary adenoma.  Reproductive/Obstetrics                             Anesthesia Physical Anesthesia Plan  ASA: III  Anesthesia Plan: MAC   Post-op Pain Management:    Induction:   Airway Management Planned:   Additional Equipment:   Intra-op Plan:   Post-operative Plan:   Informed Consent: I have reviewed the patients History and Physical, chart, labs and discussed the procedure including the risks, benefits and alternatives for the proposed anesthesia with the patient or authorized representative who has indicated his/her understanding and acceptance.     Plan Discussed with: CRNA  Anesthesia Plan Comments:         Anesthesia Quick Evaluation

## 2016-05-19 NOTE — OR Nursing (Signed)
Patient had carvedilol last pm. She normally takes it twice a day. Dr. Marcello Moores from anesthesia stated it is not necessary to repeat this am due to HR in the 60's.

## 2016-05-28 ENCOUNTER — Other Ambulatory Visit: Payer: Medicare Other

## 2016-05-28 ENCOUNTER — Ambulatory Visit: Payer: Medicare Other | Admitting: Hematology and Oncology

## 2016-06-15 ENCOUNTER — Ambulatory Visit (INDEPENDENT_AMBULATORY_CARE_PROVIDER_SITE_OTHER): Payer: Medicare Other | Admitting: Obstetrics and Gynecology

## 2016-06-15 ENCOUNTER — Encounter: Payer: Self-pay | Admitting: Obstetrics and Gynecology

## 2016-06-15 VITALS — BP 116/67 | HR 70 | Ht 60.0 in | Wt 132.0 lb

## 2016-06-15 DIAGNOSIS — N816 Rectocele: Secondary | ICD-10-CM | POA: Diagnosis not present

## 2016-06-15 DIAGNOSIS — Z4689 Encounter for fitting and adjustment of other specified devices: Secondary | ICD-10-CM

## 2016-06-15 DIAGNOSIS — N8110 Cystocele, unspecified: Secondary | ICD-10-CM | POA: Diagnosis not present

## 2016-06-15 DIAGNOSIS — IMO0002 Reserved for concepts with insufficient information to code with codable children: Secondary | ICD-10-CM

## 2016-06-15 NOTE — Patient Instructions (Signed)
Return in 8 weeks for pessary maintenance

## 2016-06-15 NOTE — Progress Notes (Signed)
Brayton Mars, MD

## 2016-06-15 NOTE — Progress Notes (Signed)
GYN ENCOUNTER NOTE  Subjective:       Laura Walters is a 82 y.o. No obstetric history on file. female is here for gynecologic evaluation of the following issues:  1. 8 week Pessary check.  2. Pelvic organ prolapse  Uses a Gel horn pessary. Denies any vaginal discharge or bleeding. Has no pain. Urinary symptoms appear to be well managed. Denies use of Trimosan gel weekly.    Gynecologic History No LMP recorded. Patient is postmenopausal. Contraception: post menopausal status Last Pap: n/a. Results were: n/a Last mammogram: 08/13/15. Results were: normal  Obstetric History OB History  No data available    Past Medical History:  Diagnosis Date  . Arthritis   . Asthma   . Back pain   . Cancer of thyroid ([place]) 03/30/2015  . Cardiomyopathy ([place])    idiopathic  . Chest pain    non cardiac  . Chokes    easily   swallow test negative  . Constipation   . Cystocele   . DDD (degenerative disc disease), lumbar   . Disc disorder   . GERD (gastroesophageal reflux disease)   . H/O wheezing    advair as needed  . Hypertension   . Hypothyroidism   . IBS (irritable bowel syndrome)   . Low back pain   . Neuropathy ([place])   . Nocturia   . Pelvic pain in female   . RAD (reactive airway disease)   . Rectocele   . Seasonal allergies   . Spinal stenosis   . Urge incontinence   . Vaginal atrophy   . Vaginal polyp   . Vulvitis     Past Surgical History:  Procedure Laterality Date  . BREAST BIOPSY Right 86 and 92   2 bx-neg  . CATARACT EXTRACTION W/PHACO Right 05/19/2016   Procedure: CATARACT EXTRACTION PHACO AND INTRAOCULAR LENS PLACEMENT (IOC);  Surgeon: Estill Cotta, MD;  Location: [facility] ORS;  Service: Ophthalmology;  Laterality: Right;  Lot# [id number] H [place]:   1:25.3 AP%:  24.9% CDE:  40.11  . DILATION AND CURETTAGE OF UTERUS    . HEMORROIDECTOMY     x 2  . JOINT REPLACEMENT     left hip  . KNEE ARTHROPLASTY Right 01/05/2016   Procedure: COMPUTER ASSISTED TOTAL KNEE  ARTHROPLASTY;  Surgeon: Dereck Leep, MD;  Location: [facility] ORS;  Service: Orthopedics;  Laterality: Right;  . KNEE ARTHROSCOPY Right 05/19/2015   Procedure: Right knee arthrosocpy medail and lateral menisectomy, chondroplasty ;  Surgeon: Dereck Leep, MD;  Location: [facility] ORS;  Service: Orthopedics;  Laterality: Right;  . Left total hip arthroplasty    . PAROTIDECTOMY    . PITUITARY SURGERY    . THYROID SURGERY     bx  . THYROIDECTOMY      Current Outpatient Prescriptions on File Prior to Visit  Medication Sig Dispense Refill  . carvedilol (COREG) 3.125 MG tablet Take 3.125 mg by mouth 2 (two) times daily with a meal.     . DULoxetine (CYMBALTA) 60 MG capsule Take 60 mg by mouth daily.     . DUREZOL 0.05 % EMUL Apply 1 drop to eye 2 (two) times daily.     . ferrous sulfate 325 (65 FE) MG tablet Take by mouth.    . fexofenadine-pseudoephedrine (ALLEGRA-D 24) 180-240 MG 24 hr tablet 1 tablet daily. hs    . fluticasone (FLONASE) 50 MCG/ACT nasal spray Place 2 sprays into the nose daily.     . Fluticasone-Salmeterol (ADVAIR DISKUS)  250-50 MCG/DOSE AEPB Inhale 1 puff into the lungs 2 (two) times daily.    . furosemide (LASIX) 20 MG tablet TAKE 1 TABLET BY MOUTH TWICE DAILY.    Marland Kitchen gabapentin (NEURONTIN) 400 MG capsule Take 800 mg by mouth 3 (three) times daily.     Marland Kitchen levothyroxine (SYNTHROID, LEVOTHROID) 100 MCG tablet Take 100 mcg by mouth daily before breakfast.    . Multiple Vitamins-Minerals (PRESERVISION AREDS 2) CAPS Take 1 capsule by mouth 2 (two) times daily.    Marland Kitchen nystatin (MYCOSTATIN) powder Apply topically 2 (two) times daily as needed.     Marland Kitchen omeprazole (PRILOSEC) 40 MG capsule Take 40 mg by mouth daily.     . potassium chloride (K-DUR,KLOR-CON) 10 MEQ tablet Take 10 mEq by mouth once.    . traMADol (ULTRAM) 50 MG tablet Take 1-2 tablets (50-100 mg total) by mouth every 4 (four) hours as needed for moderate pain. 60 tablet 0   No current facility-administered medications on file  prior to visit.     Allergies  Allergen Reactions  . Aleve [Naproxen]   . Codeine Hives and Itching  . Penicillins Hives  . Sulfa Antibiotics Hives    Social History   Social History  . Marital status: Widowed    Spouse name: N/A  . Number of children: N/A  . Years of education: N/A   Occupational History  . Not on file.   Social History Main Topics  . Smoking status: Never Smoker  . Smokeless tobacco: Never Used  . Alcohol use No  . Drug use: No  . Sexual activity: No   Other Topics Concern  . Not on file   Social History Narrative  . No narrative on file    Family History  Problem Relation Age of Onset  . Heart disease Father   . Diabetes Paternal Uncle   . Cancer Neg Hx     The following portions of the patient's history were reviewed and updated as appropriate: allergies, current medications, past family history, past medical history, past social history, past surgical history and problem list.  Review of Systems Review of Systems - Negative except HPI Review of Systems - General ROS: negative for - chills, fatigue, fever, hot flashes, malaise or night sweats Hematological and Lymphatic ROS: negative for - bleeding problems or swollen lymph nodes Gastrointestinal ROS: negative for - abdominal pain, blood in stools, change in bowel habits and nausea/vomiting Musculoskeletal ROS: negative for - joint pain, muscle pain or muscular weakness Genito-Urinary ROS: negative for - change in menstrual cycle, dysmenorrhea, dyspareunia, dysuria, genital discharge, genital ulcers, hematuria, incontinence, irregular/heavy menses, nocturia or pelvic pain  Objective:   BP 116/67   Pulse 70   Ht 5' (1.524 m)   Wt 132 lb (59.9 kg)   BMI 25.78 kg/m  CONSTITUTIONAL: Well-developed, well-nourished female in no acute distress.  HENT:  Normocephalic, atraumatic.  NECK: Not examined.  SKIN: Skin is warm and dry. No rash noted. Not diaphoretic. No erythema. No  pallor. Trail: Alert and oriented to person, place, and time. PSYCHIATRIC: Normal mood and affect. Normal behavior. Normal judgment and thought content. CARDIOVASCULAR:Not Examined RESPIRATORY: Not Examined BREASTS: Not Examined ABDOMEN: Soft, non distended; Non tender.  No Organomegaly. PELVIC:  External Genitalia: Normal  BUS: Normal  Vagina: Mild hyperemia left side of vagina near cervix  Cervix: Normal  Uterus: Normal size, shape,consistency, mobile, nontender  Adnexa: Normal; nonpalpable and nontender  RV: Normal external exam  Bladder: Nontender MUSCULOSKELETAL: Normal range  of motion. No tenderness.  No cyanosis, clubbing, or edema.  PROCEDURE: Pessary is removed, cleaned, and reinserted   Assessment:   1. Pessary maintenance   2. Cystocele   3. Rectocele      Plan:  Continue use of Gel horn pessary. Follow up in 8 weeks for pessary maintenance.  A total of 15 minutes were spent face-to-face with the patient during this encounter and over half of that time dealt with counseling and coordination of care.    Grayland Ormond PA-S Brayton Mars, MD   I have seen, interviewed, and examined the patient in conjunction with the [facility].A. student and affirm the diagnosis and management plan. Tashe Purdon A. Wanona Stare, MD, FACOG   Note: This dictation was prepared with Dragon dictation along with smaller phrase technology. Any transcriptional errors that result from this process are unintentional.

## 2016-07-02 ENCOUNTER — Ambulatory Visit
Admission: RE | Admit: 2016-07-02 | Discharge: 2016-07-02 | Disposition: A | Discharge status: Home / Self Care | Payer: Medicare Other | Source: Ambulatory Visit | Attending: Hematology and Oncology | Admitting: Hematology and Oncology

## 2016-07-02 DIAGNOSIS — C73 Malignant neoplasm of thyroid gland: Secondary | ICD-10-CM | POA: Diagnosis present

## 2016-07-02 DIAGNOSIS — I2510 Atherosclerotic heart disease of native coronary artery without angina pectoris: Secondary | ICD-10-CM | POA: Insufficient documentation

## 2016-07-02 DIAGNOSIS — I700 Atherosclerosis of aorta: Secondary | ICD-10-CM | POA: Diagnosis not present

## 2016-07-02 DIAGNOSIS — R918 Other nonspecific abnormal finding of lung field: Secondary | ICD-10-CM | POA: Diagnosis not present

## 2016-07-02 DIAGNOSIS — C7800 Secondary malignant neoplasm of unspecified lung: Secondary | ICD-10-CM | POA: Insufficient documentation

## 2016-07-04 NOTE — Progress Notes (Signed)
[facility] day:  07/05/16  Chief Complaint: Laura Walters is a 82 y.o. female with thyroid carcinoma who is seen for 3 month assessment.  HPI:  The patient was last seen in the medical oncology clinic on 04/09/2016.  At that time, she was seen for initial assessment by me.  We discussed her diagnosis of recurrent thyroid cancer.  As recent I-131 scan documented non-avid I-131 thyroid cancer, there was no role for I-131 therapy.   We discussed her recent PET scan revealing active disease.  We discussed the option of observation versus treatment. We discussed observation given her age and asymptomatic metastatic disease with nodules less than 1-2 cm area and disease growth less than 20% per year.  We discussed the plan for follow-up imaging every 6 months.  We discussed potential treatment options (sorafenib or lenvatinib).  Chest CT on 07/02/2016 revealed stable metastatic disease to the lungs, with minimal enlargement of some pulmonary nodules, but no new pulmonary nodules.  There was a new healing fracture of the anterior aspect of the left fifth rib.  There was no associated pneumothorax.  There was aortic atherosclerosis, in addition to three-vessel coronary artery disease.  Symptomatically, she feels "halfway".  She has been be busy.  She states that she works too hard. Her only concern is waking up at times with a swollen right hand.   Past Medical History:  Diagnosis Date  . Arthritis   . Asthma   . Back pain   . Cancer of thyroid ([place]) 03/30/2015  . Cardiomyopathy ([place])    idiopathic  . Chest pain    non cardiac  . Chokes    easily   swallow test negative  . Constipation   . Cystocele   . DDD (degenerative disc disease), lumbar   . Disc disorder   . GERD (gastroesophageal reflux disease)   . H/O wheezing    advair as needed  . Hypertension   . Hypothyroidism   . IBS (irritable bowel syndrome)   . Low back pain   . Neuropathy  ([place])   . Nocturia   . Pelvic pain in female   . RAD (reactive airway disease)   . Rectocele   . Seasonal allergies   . Spinal stenosis   . Urge incontinence   . Vaginal atrophy   . Vaginal polyp   . Vulvitis     Past Surgical History:  Procedure Laterality Date  . BREAST BIOPSY Right 86 and 92   2 bx-neg  . CATARACT EXTRACTION W/PHACO Right 05/19/2016   Procedure: CATARACT EXTRACTION PHACO AND INTRAOCULAR LENS PLACEMENT (IOC);  Surgeon: Estill Cotta, MD;  Location: [facility] ORS;  Service: Ophthalmology;  Laterality: Right;  Lot# [id number] H [place]:   1:25.3 AP%:  24.9% CDE:  40.11  . DILATION AND CURETTAGE OF UTERUS    . HEMORROIDECTOMY     x 2  . JOINT REPLACEMENT     left hip  . KNEE ARTHROPLASTY Right 01/05/2016   Procedure: COMPUTER ASSISTED TOTAL KNEE ARTHROPLASTY;  Surgeon: Dereck Leep, MD;  Location: [facility] ORS;  Service: Orthopedics;  Laterality: Right;  . KNEE ARTHROSCOPY Right 05/19/2015   Procedure: Right knee arthrosocpy medail and lateral menisectomy, chondroplasty ;  Surgeon: Dereck Leep, MD;  Location: [facility] ORS;  Service: Orthopedics;  Laterality: Right;  . Left total hip arthroplasty    . PAROTIDECTOMY    . PITUITARY SURGERY    . THYROID SURGERY  bx  . THYROIDECTOMY      Family History  Problem Relation Age of Onset  . Heart disease Father   . Diabetes Paternal Uncle   . Cancer Neg Hx     Social History:  reports that she has never smoked. She has never used smokeless tobacco. She reports that she does not drink alcohol or use drugs.  The patient lives alone in [place]. The patient is accompanied by her chauffeur, her son [name], today.  Allergies:  Allergies  Allergen Reactions  . Aleve [Naproxen]   . Codeine Hives and Itching  . Penicillins Hives  . Sulfa Antibiotics Hives    Current Medications: Current Outpatient Prescriptions  Medication Sig Dispense Refill  . carvedilol (COREG) 3.125 MG tablet Take 3.125 mg by mouth 2 (two) times daily  with a meal.     . DULoxetine (CYMBALTA) 60 MG capsule Take 60 mg by mouth daily.     . fexofenadine-pseudoephedrine (ALLEGRA-D 24) 180-240 MG 24 hr tablet 1 tablet daily. hs    . fluticasone (FLONASE) 50 MCG/ACT nasal spray Place 2 sprays into the nose daily.     . Fluticasone-Salmeterol (ADVAIR DISKUS) 250-50 MCG/DOSE AEPB Inhale 1 puff into the lungs 2 (two) times daily.    . furosemide (LASIX) 20 MG tablet TAKE 1 TABLET BY MOUTH TWICE DAILY.    Marland Kitchen gabapentin (NEURONTIN) 400 MG capsule Take 800 mg by mouth 3 (three) times daily.     Marland Kitchen levothyroxine (SYNTHROID, LEVOTHROID) 100 MCG tablet Take 100 mcg by mouth daily before breakfast.    . Multiple Vitamins-Minerals (PRESERVISION AREDS 2) CAPS Take 1 capsule by mouth 2 (two) times daily.    Marland Kitchen nystatin (MYCOSTATIN) powder Apply topically 2 (two) times daily as needed.     Marland Kitchen omeprazole (PRILOSEC) 40 MG capsule Take 40 mg by mouth daily.     . potassium chloride (K-DUR,KLOR-CON) 10 MEQ tablet Take 10 mEq by mouth once.    . traMADol (ULTRAM) 50 MG tablet Take 1-2 tablets (50-100 mg total) by mouth every 4 (four) hours as needed for moderate pain. 60 tablet 0  . ferrous sulfate 325 (65 FE) MG tablet Take by mouth.     No current facility-administered medications for this visit.     Review of Systems:  GENERAL:  Feels "half way".  Active.  No fevers, sweats or weight loss. PERFORMANCE STATUS (ECOG):  1 HEENT:  No visual changes, runny nose, sore throat, mouth sores or tenderness. Lungs: No shortness of breath or cough.  No hemoptysis. Cardiac:  No chest pain, palpitations, orthopnea, or PND. GI:  No nausea, vomiting, diarrhea, constipation, melena or hematochezia. GU:  No urgency, frequency, dysuria, or hematuria. Musculoskeletal:  Chronic back pain secondary to stenosis.  Arthritis.  No muscle tenderness. Extremities:  Intermittent swelling of right hand.  No pain. Skin:  No rashes or skin changes. Neuro:  No headache, numbness or weakness,  balance or coordination issues. Endocrine:  No diabetes.  On Synthroid s/p thyroidectomy for thyroid cancer.  No hot flashes or night sweats. Psych:  No mood changes, depression or anxiety. Pain:  Back pain. Review of systems:  All other systems reviewed and found to be negative.  Physical Exam: Blood pressure 127/79, pulse 69, temperature (!) 96.3 F (35.7 C), temperature source Tympanic, resp. rate 18, weight 132 lb 15 oz (60.3 kg). GENERAL:  Well developed, well nourished, elderly woman sitting comfortably in the exam room in no acute distress.  She has a cane at  her side. MENTAL STATUS:  Alert and oriented to person, place and time. HEAD:  Short blonde/white hair.  Normocephalic, atraumatic, face symmetric, no Cushingoid features. EYES:  Glasses.  Blue eyes.  Pupils equal round and reactive to light and accomodation.  No conjunctivitis or scleral icterus. ENT:  Oropharynx clear without lesion.  Tongue normal. Mucous membranes dry.  RESPIRATORY:  Clear to auscultation without rales, wheezes or rhonchi. CARDIOVASCULAR:  Regular rate and rhythm without murmur, rub or gallop. ABDOMEN:  Soft, non-tender, with active bowel sounds, and no hepatosplenomegaly.  No masses. SKIN:  No rashes, ulcers or lesions. EXTREMITIES: No edema, no skin discoloration or tenderness.  No palpable cords. LYMPH NODES: No palpable cervical, supraclavicular, axillary or inguinal adenopathy  NEUROLOGICAL: Unremarkable. PSYCH:  Appropriate.   Orders Only on 07/05/2016  Component Date Value Ref Range Status  . WBC 07/05/2016 7.0  3.6 - 11.0 K/uL Final  . RBC 07/05/2016 4.13  3.80 - 5.20 MIL/uL Final  . Hemoglobin 07/05/2016 12.3  12.0 - 16.0 g/dL Final  . HCT 07/05/2016 35.8  35.0 - 47.0 % Final  . MCV 07/05/2016 86.7  80.0 - 100.0 fL Final  . MCH 07/05/2016 29.8  26.0 - 34.0 pg Final  . MCHC 07/05/2016 34.4  32.0 - 36.0 g/dL Final  . RDW 07/05/2016 14.5  11.5 - 14.5 % Final  . Platelets 07/05/2016 293  150 -  440 K/uL Final  . Neutrophils Relative % 07/05/2016 64  % Final  . Neutro Abs 07/05/2016 4.5  1.4 - 6.5 K/uL Final  . Lymphocytes Relative 07/05/2016 16  % Final  . Lymphs Abs 07/05/2016 1.1  1.0 - 3.6 K/uL Final  . Monocytes Relative 07/05/2016 13  % Final  . Monocytes Absolute 07/05/2016 0.9  0.2 - 0.9 K/uL Final  . Eosinophils Relative 07/05/2016 6  % Final  . Eosinophils Absolute 07/05/2016 0.4  0 - 0.7 K/uL Final  . Basophils Relative 07/05/2016 1  % Final  . Basophils Absolute 07/05/2016 0.0  0 - 0.1 K/uL Final    Assessment:  SANAM DWIGGINS is a 82 y.o. female with a recurrent thyroid cancer.  She was diagnosed with thyroid carcinoma in 08/2002.  She underwent thyroidectomy and lymph node dissection.  She received I-131 in 10/2002.    Her thyroid cancer recurred in 2012. CT scans in 01/2011 revealed lung nodules.  She received I-131 in 08/2011.  Follow-up scans revealed stable disease.  PET scan on 01/01/2016 revealed progressive disease, as evidenced by increased size and hypermetabolism of cervical nodes, thoracic nodes, and pulmonary nodules.  There was a foci of osseous hypermetabolism (indeterminate). Right posterior ninth rib and transverse process hypermetabolism could be posttraumatic, given suggestion of nondisplaced rib fracture.  I-131 scan on 02/16/2016 revealed no evidence of I-131 avid thyroid carcinoma.  Ultrasound guided biopsy of the left supraclavicular lymph node on 03/24/2016 revealed metastatic papillary thyroid carcinoma.  Chest CT on 07/02/2016 revealed stable metastatic disease to the lungs, with minimal enlargement of some pulmonary nodules, but no new pulmonary nodules. There was a new healing fracture of the anterior aspect of the left fifth rib.   Symptomatically, she denies any respiratory symptoms.  She has intermittent right hand swelling.  Plan: 1.  Labs today:  CBC with diff, CMP, TSH, T4, thyroglobulin. 2.  Review interval chest CT. 3.  Discuss plan  for ongoing observation.  Discuss patient's thoughts about follow-up imaging.  She would like to continue with the current plan (alternating PET and  CT scan). 4.  PET scan on 12/31/2016. 5.  RTC after PET scan for MD assessment, labs (CBC with diff, CMP, TSH, T4, thyroglobulin), and review of PET.   Lequita Asal, MD  07/05/2016, 2:20 PM

## 2016-07-05 ENCOUNTER — Inpatient Hospital Stay (HOSPITAL_BASED_OUTPATIENT_CLINIC_OR_DEPARTMENT_OTHER): Payer: Medicare Other | Admitting: Hematology and Oncology

## 2016-07-05 ENCOUNTER — Inpatient Hospital Stay: Payer: Medicare Other | Attending: Hematology and Oncology

## 2016-07-05 ENCOUNTER — Other Ambulatory Visit: Payer: Self-pay | Admitting: *Deleted

## 2016-07-05 ENCOUNTER — Other Ambulatory Visit: Payer: Self-pay

## 2016-07-05 ENCOUNTER — Encounter: Payer: Self-pay | Admitting: Hematology and Oncology

## 2016-07-05 VITALS — BP 127/79 | HR 69 | Temp 96.3°F | Resp 18 | Wt 132.9 lb

## 2016-07-05 DIAGNOSIS — I700 Atherosclerosis of aorta: Secondary | ICD-10-CM | POA: Insufficient documentation

## 2016-07-05 DIAGNOSIS — M545 Low back pain: Secondary | ICD-10-CM | POA: Insufficient documentation

## 2016-07-05 DIAGNOSIS — M5136 Other intervertebral disc degeneration, lumbar region: Secondary | ICD-10-CM

## 2016-07-05 DIAGNOSIS — I429 Cardiomyopathy, unspecified: Secondary | ICD-10-CM

## 2016-07-05 DIAGNOSIS — M129 Arthropathy, unspecified: Secondary | ICD-10-CM | POA: Diagnosis not present

## 2016-07-05 DIAGNOSIS — N8110 Cystocele, unspecified: Secondary | ICD-10-CM

## 2016-07-05 DIAGNOSIS — G629 Polyneuropathy, unspecified: Secondary | ICD-10-CM

## 2016-07-05 DIAGNOSIS — Z79899 Other long term (current) drug therapy: Secondary | ICD-10-CM | POA: Diagnosis not present

## 2016-07-05 DIAGNOSIS — N952 Postmenopausal atrophic vaginitis: Secondary | ICD-10-CM | POA: Insufficient documentation

## 2016-07-05 DIAGNOSIS — K5900 Constipation, unspecified: Secondary | ICD-10-CM | POA: Insufficient documentation

## 2016-07-05 DIAGNOSIS — E039 Hypothyroidism, unspecified: Secondary | ICD-10-CM | POA: Diagnosis not present

## 2016-07-05 DIAGNOSIS — M4800 Spinal stenosis, site unspecified: Secondary | ICD-10-CM | POA: Insufficient documentation

## 2016-07-05 DIAGNOSIS — R102 Pelvic and perineal pain: Secondary | ICD-10-CM

## 2016-07-05 DIAGNOSIS — J45909 Unspecified asthma, uncomplicated: Secondary | ICD-10-CM | POA: Insufficient documentation

## 2016-07-05 DIAGNOSIS — N816 Rectocele: Secondary | ICD-10-CM | POA: Diagnosis not present

## 2016-07-05 DIAGNOSIS — C7800 Secondary malignant neoplasm of unspecified lung: Secondary | ICD-10-CM | POA: Insufficient documentation

## 2016-07-05 DIAGNOSIS — I2510 Atherosclerotic heart disease of native coronary artery without angina pectoris: Secondary | ICD-10-CM

## 2016-07-05 DIAGNOSIS — K589 Irritable bowel syndrome without diarrhea: Secondary | ICD-10-CM

## 2016-07-05 DIAGNOSIS — N3941 Urge incontinence: Secondary | ICD-10-CM | POA: Insufficient documentation

## 2016-07-05 DIAGNOSIS — R351 Nocturia: Secondary | ICD-10-CM | POA: Diagnosis not present

## 2016-07-05 DIAGNOSIS — R079 Chest pain, unspecified: Secondary | ICD-10-CM | POA: Diagnosis not present

## 2016-07-05 DIAGNOSIS — K219 Gastro-esophageal reflux disease without esophagitis: Secondary | ICD-10-CM | POA: Insufficient documentation

## 2016-07-05 DIAGNOSIS — C73 Malignant neoplasm of thyroid gland: Secondary | ICD-10-CM

## 2016-07-05 DIAGNOSIS — M7989 Other specified soft tissue disorders: Secondary | ICD-10-CM

## 2016-07-05 DIAGNOSIS — C770 Secondary and unspecified malignant neoplasm of lymph nodes of head, face and neck: Secondary | ICD-10-CM

## 2016-07-05 DIAGNOSIS — C779 Secondary and unspecified malignant neoplasm of lymph node, unspecified: Secondary | ICD-10-CM

## 2016-07-05 DIAGNOSIS — I10 Essential (primary) hypertension: Secondary | ICD-10-CM | POA: Diagnosis not present

## 2016-07-05 LAB — CBC WITH DIFFERENTIAL/PLATELET
Basophils Absolute: 0 10*3/uL (ref 0–0.1)
Basophils Relative: 1 %
Eosinophils Absolute: 0.4 10*3/uL (ref 0–0.7)
Eosinophils Relative: 6 %
HCT: 35.8 % (ref 35.0–47.0)
Hemoglobin: 12.3 g/dL (ref 12.0–16.0)
Lymphocytes Relative: 16 %
Lymphs Abs: 1.1 10*3/uL (ref 1.0–3.6)
MCH: 29.8 pg (ref 26.0–34.0)
MCHC: 34.4 g/dL (ref 32.0–36.0)
MCV: 86.7 fL (ref 80.0–100.0)
Monocytes Absolute: 0.9 10*3/uL (ref 0.2–0.9)
Monocytes Relative: 13 %
Neutro Abs: 4.5 10*3/uL (ref 1.4–6.5)
Neutrophils Relative %: 64 %
Platelets: 293 10*3/uL (ref 150–440)
RBC: 4.13 MIL/uL (ref 3.80–5.20)
RDW: 14.5 % (ref 11.5–14.5)
WBC: 7 10*3/uL (ref 3.6–11.0)

## 2016-07-05 LAB — COMPREHENSIVE METABOLIC PANEL
ALT: 13 U/L — ABNORMAL LOW (ref 14–54)
AST: 27 U/L (ref 15–41)
Albumin: 3.8 g/dL (ref 3.5–5.0)
Alkaline Phosphatase: 137 U/L — ABNORMAL HIGH (ref 38–126)
Anion gap: 10 (ref 5–15)
BUN: 18 mg/dL (ref 6–20)
CO2: 26 mmol/L (ref 22–32)
Calcium: 8.4 mg/dL — ABNORMAL LOW (ref 8.9–10.3)
Chloride: 99 mmol/L — ABNORMAL LOW (ref 101–111)
Creatinine, Ser: 1.06 mg/dL — ABNORMAL HIGH (ref 0.44–1.00)
GFR calc Af Amer: 56 mL/min — ABNORMAL LOW (ref >60)
GFR calc non Af Amer: 48 mL/min — ABNORMAL LOW (ref >60)
Glucose, Bld: 124 mg/dL — ABNORMAL HIGH (ref 65–99)
Potassium: 3.6 mmol/L (ref 3.5–5.1)
Sodium: 135 mmol/L (ref 135–145)
Total Bilirubin: 0.3 mg/dL (ref 0.3–1.2)
Total Protein: 7.3 g/dL (ref 6.5–8.1)

## 2016-07-05 LAB — TSH: TSH: 0.695 u[IU]/mL (ref 0.350–4.500)

## 2016-07-05 NOTE — Progress Notes (Signed)
Pt here to get results from ct scan to check up on her thyroid cancer. She had no c/o of lymp node enlargement. She only has one concern and that is her right hand swelling sometimes. She turns a certain way  Of right shoulder then it does hurt some but it did not seem to play in part of the hand swelling.  She is eating well and has some sx of constipation and her PCP gave her rx for 2 little orange pills she is to take once  A day.

## 2016-07-06 LAB — T4: T4, Total: 6 ug/dL (ref 4.5–12.0)

## 2016-07-18 ENCOUNTER — Encounter: Payer: Self-pay | Admitting: Hematology and Oncology

## 2016-07-20 ENCOUNTER — Other Ambulatory Visit: Payer: Self-pay | Admitting: Internal Medicine

## 2016-07-20 DIAGNOSIS — Z1231 Encounter for screening mammogram for malignant neoplasm of breast: Secondary | ICD-10-CM

## 2016-08-04 ENCOUNTER — Encounter: Payer: Self-pay | Admitting: *Deleted

## 2016-08-10 ENCOUNTER — Ambulatory Visit (INDEPENDENT_AMBULATORY_CARE_PROVIDER_SITE_OTHER): Payer: Medicare Other | Admitting: Obstetrics and Gynecology

## 2016-08-10 VITALS — BP 123/68 | HR 72 | Ht 60.0 in | Wt 134.0 lb

## 2016-08-10 DIAGNOSIS — R350 Frequency of micturition: Secondary | ICD-10-CM | POA: Diagnosis not present

## 2016-08-10 DIAGNOSIS — Z4689 Encounter for fitting and adjustment of other specified devices: Secondary | ICD-10-CM

## 2016-08-10 DIAGNOSIS — N816 Rectocele: Secondary | ICD-10-CM

## 2016-08-10 DIAGNOSIS — N8111 Cystocele, midline: Secondary | ICD-10-CM | POA: Diagnosis not present

## 2016-08-10 LAB — POCT URINALYSIS DIPSTICK
Bilirubin, UA: NEGATIVE
GLUCOSE UA: NEGATIVE
KETONES UA: NEGATIVE
Nitrite, UA: NEGATIVE
Protein, UA: NEGATIVE
RBC UA: NEGATIVE
UROBILINOGEN UA: NEGATIVE
pH, UA: 7.5

## 2016-08-10 NOTE — Patient Instructions (Signed)
1. Urinalysis and urine culture are obtained today 2. Return in 8 weeks for pessary maintenance

## 2016-08-10 NOTE — H&P (Signed)
See scanned note.

## 2016-08-10 NOTE — Progress Notes (Signed)
Chief complaint: 1. Pessary maintenance 2. Pelvic organ prolapse  8 week pessary check (last visit 06/15/2016)  Patient is using gel horn pessary for management of rectocele and uterine prolapse. She denies vaginal bleeding or discharge. She denies pelvic pain. She does have occasional UTI symptoms, along with history of recurrent UTIs. Currently using Trimosan gel weekly  Gynecologic History No LMP recorded. Patient is postmenopausal. Contraception: post menopausal status Last Pap: n/a. Results were: n/a Last mammogram: 08/13/15. Results were: normal  OBJECTIVE: BP 123/68   Pulse 72   Ht 5' (1.524 m)   Wt 134 lb (60.8 kg)   BMI 26.17 kg/m   CONSTITUTIONAL: Well-developed, well-nourished female in no acute distress.  HENT:  Normocephalic, atraumatic.  NECK: Not examined.  SKIN: Skin is warm and dry. No rash noted. Not diaphoretic. No erythema. No pallor. [place]: Alert and oriented to person, place, and time. PSYCHIATRIC: Normal mood and affect. Normal behavior. Normal judgment and thought content. CARDIOVASCULAR:Not Examined RESPIRATORY: Not Examined BREASTS: Not Examined ABDOMEN: Soft, non distended; Non tender.  No Organomegaly. PELVIC:             External Genitalia: Normal             BUS: Normal             Vagina: Mild hyperemia left side of vagina near cervix             Cervix: Normal             Uterus: Normal size, shape,consistency, mobile, nontender             Adnexa: Normal; nonpalpable and nontender             RV: Normal external exam             Bladder: Nontender MUSCULOSKELETAL: Normal range of motion. No tenderness.  No cyanosis, clubbing, or edema.  PROCEDURE: Pessary is removed, cleaned, and reinserted  ASSESSMENT: 1. Pessary maintenance 2. Cystocele 3. Rectocele 4. UTI symptoms  PLAN: 1. Return in 8 weeks for pessary maintenance 2. Continue with Trimosan intravaginal weekly 3. Urinalysis and urine culture are obtained  A total of  15 minutes were spent face-to-face with the patient during this encounter and over half of that time dealt with counseling and coordination of care.   Brayton Mars, MD  Note: This dictation was prepared with Dragon dictation along with smaller phrase technology. Any transcriptional errors that result from this process are unintentional.

## 2016-08-11 ENCOUNTER — Ambulatory Visit: Payer: Medicare Other | Admitting: Anesthesiology

## 2016-08-11 ENCOUNTER — Ambulatory Visit
Admission: RE | Admit: 2016-08-11 | Discharge: 2016-08-11 | Disposition: A | Discharge status: Home / Self Care | Payer: Medicare Other | Source: Ambulatory Visit | Attending: Ophthalmology | Admitting: Ophthalmology

## 2016-08-11 ENCOUNTER — Encounter: Payer: Self-pay | Admitting: *Deleted

## 2016-08-11 ENCOUNTER — Encounter
Admission: RE | Disposition: A | Discharge status: Home / Self Care | Payer: Self-pay | Source: Ambulatory Visit | Attending: Ophthalmology

## 2016-08-11 DIAGNOSIS — Z79899 Other long term (current) drug therapy: Secondary | ICD-10-CM | POA: Insufficient documentation

## 2016-08-11 DIAGNOSIS — E039 Hypothyroidism, unspecified: Secondary | ICD-10-CM | POA: Diagnosis not present

## 2016-08-11 DIAGNOSIS — I10 Essential (primary) hypertension: Secondary | ICD-10-CM | POA: Diagnosis not present

## 2016-08-11 DIAGNOSIS — K219 Gastro-esophageal reflux disease without esophagitis: Secondary | ICD-10-CM | POA: Insufficient documentation

## 2016-08-11 DIAGNOSIS — H2512 Age-related nuclear cataract, left eye: Secondary | ICD-10-CM | POA: Diagnosis not present

## 2016-08-11 HISTORY — PX: CATARACT EXTRACTION W/PHACO: SHX586

## 2016-08-11 SURGERY — PHACOEMULSIFICATION, CATARACT, WITH IOL INSERTION
Anesthesia: Monitor Anesthesia Care | Site: Eye | Laterality: Left | Wound class: Clean

## 2016-08-11 MED ORDER — NA CHONDROIT SULF-NA HYALURON 40-17 MG/ML IO SOLN
INTRAOCULAR | Status: DC | PRN
  Administered 2016-08-11: 1 mL via INTRAOCULAR

## 2016-08-11 MED ORDER — MOXIFLOXACIN HCL 0.5 % OP SOLN
1.0000 [drp] | OPHTHALMIC | Status: AC | PRN
  Administered 2016-08-11 (×2): 1 [drp] via OPHTHALMIC

## 2016-08-11 MED ORDER — EPINEPHRINE PF 1 MG/ML IJ SOLN
INTRAOCULAR | Status: DC | PRN
  Administered 2016-08-11: 1 mL via OPHTHALMIC

## 2016-08-11 MED ORDER — POVIDONE-IODINE 5 % OP SOLN
OPHTHALMIC | Status: DC | PRN
  Administered 2016-08-11: 1 via OPHTHALMIC

## 2016-08-11 MED ORDER — CYCLOPENTOLATE HCL 2 % OP SOLN
OPHTHALMIC | Status: AC
  Filled 2016-08-11: qty 2

## 2016-08-11 MED ORDER — MOXIFLOXACIN HCL 0.5 % OP SOLN
OPHTHALMIC | Status: AC
  Filled 2016-08-11: qty 3

## 2016-08-11 MED ORDER — LIDOCAINE HCL (PF) 4 % IJ SOLN
INTRAMUSCULAR | Status: DC | PRN
  Administered 2016-08-11: 9 mL via OPHTHALMIC

## 2016-08-11 MED ORDER — TETRACAINE HCL 0.5 % OP SOLN
OPHTHALMIC | Status: DC | PRN
  Administered 2016-08-11: 1 [drp] via OPHTHALMIC

## 2016-08-11 MED ORDER — EPINEPHRINE PF 1 MG/ML IJ SOLN
INTRAMUSCULAR | Status: AC
Start: 2016-08-11 — End: 2016-08-11
  Filled 2016-08-11: qty 2

## 2016-08-11 MED ORDER — CARBACHOL 0.01 % IO SOLN
INTRAOCULAR | Status: DC | PRN
  Administered 2016-08-11: 0.5 mL via INTRAOCULAR

## 2016-08-11 MED ORDER — POVIDONE-IODINE 5 % OP SOLN
OPHTHALMIC | Status: AC
  Filled 2016-08-11: qty 30

## 2016-08-11 MED ORDER — LIDOCAINE HCL (PF) 4 % IJ SOLN
INTRAMUSCULAR | Status: AC
  Filled 2016-08-11: qty 5

## 2016-08-11 MED ORDER — PHENYLEPHRINE HCL 10 % OP SOLN
1.0000 [drp] | OPHTHALMIC | Status: DC | PRN
Start: 2016-08-11 — End: 2016-08-13
  Administered 2016-08-11 (×3): 1 [drp] via OPHTHALMIC

## 2016-08-11 MED ORDER — CYCLOPENTOLATE HCL 2 % OP SOLN
1.0000 [drp] | OPHTHALMIC | Status: DC | PRN
  Administered 2016-08-11 (×3): 1 [drp] via OPHTHALMIC

## 2016-08-11 MED ORDER — BUPIVACAINE HCL (PF) 0.75 % IJ SOLN
INTRAMUSCULAR | Status: AC
Start: 2016-08-11 — End: 2016-08-11
  Filled 2016-08-11: qty 10

## 2016-08-11 MED ORDER — MIDAZOLAM HCL 2 MG/2ML IJ SOLN
INTRAMUSCULAR | Status: DC | PRN
Start: 2016-08-11 — End: 2016-08-11
  Administered 2016-08-11: 1 mg via INTRAVENOUS

## 2016-08-11 MED ORDER — EPINEPHRINE PF 1 MG/ML IJ SOLN
INTRAMUSCULAR | Status: AC
  Filled 2016-08-11: qty 1

## 2016-08-11 MED ORDER — HYALURONIDASE HUMAN 150 UNIT/ML IJ SOLN
INTRAMUSCULAR | Status: AC
  Filled 2016-08-11: qty 1

## 2016-08-11 MED ORDER — SODIUM CHLORIDE 0.9 % IV SOLN
INTRAVENOUS | Status: DC
  Administered 2016-08-11 (×2): via INTRAVENOUS

## 2016-08-11 MED ORDER — PHENYLEPHRINE HCL 10 % OP SOLN
OPHTHALMIC | Status: AC
  Filled 2016-08-11: qty 5

## 2016-08-11 MED ORDER — ALFENTANIL 500 MCG/ML IJ INJ
INJECTION | INTRAMUSCULAR | Status: DC | PRN
  Administered 2016-08-11: 500 ug via INTRAVENOUS

## 2016-08-11 MED ORDER — MOXIFLOXACIN HCL 0.5 % OP SOLN
OPHTHALMIC | Status: DC | PRN
  Administered 2016-08-11: 1 [drp] via OPHTHALMIC

## 2016-08-11 MED ORDER — CEFUROXIME OPHTHALMIC INJECTION 1 MG/0.1 ML
INJECTION | OPHTHALMIC | Status: AC
  Filled 2016-08-11: qty 0.1

## 2016-08-11 MED ORDER — LIDOCAINE HCL (PF) 4 % IJ SOLN
INTRAOCULAR | Status: DC | PRN
  Administered 2016-08-11: 4 mL via OPHTHALMIC

## 2016-08-11 MED ORDER — NA CHONDROIT SULF-NA HYALURON 40-17 MG/ML IO SOLN
INTRAOCULAR | Status: AC
  Filled 2016-08-11: qty 1

## 2016-08-11 MED ORDER — LIDOCAINE HCL (PF) 4 % IJ SOLN
INTRAMUSCULAR | Status: AC
Start: 2016-08-11 — End: 2016-08-11
  Filled 2016-08-11: qty 5

## 2016-08-11 MED ORDER — TETRACAINE HCL 0.5 % OP SOLN
OPHTHALMIC | Status: AC
  Filled 2016-08-11: qty 2

## 2016-08-11 SURGICAL SUPPLY — 29 items

## 2016-08-11 NOTE — Discharge Instructions (Signed)
Eye Surgery Discharge Instructions  Expect mild scratchy sensation or mild soreness. DO NOT RUB YOUR EYE!  The day of surgery:  Minimal physical activity, but bed rest is not required  No reading, computer work, or close hand work  No bending, lifting, or straining.  May watch TV  For 24 hours:  No driving, legal decisions, or alcoholic beverages  Safety precautions  Eat anything you prefer: It is better to start with liquids, then soup then solid foods.  _____ Eye patch should be worn until postoperative exam tomorrow.  ____ Solar shield eyeglasses should be worn for comfort in the sunlight/patch while sleeping  Resume all regular medications including aspirin or Coumadin if these were discontinued prior to surgery. You may shower, bathe, shave, or wash your hair. Tylenol may be taken for mild discomfort.  Call your doctor if you experience significant pain, nausea, or vomiting, fever > 101 or other signs of infection. [phone] or [phone] Specific instructions:  Follow-up Information    DINGELDEIN,STEVEN, MD Follow up.   Specialty:  Ophthalmology Why:  November 16 at 9:45am Contact information: [address]   [name] [address] [phone]

## 2016-08-11 NOTE — Interval H&P Note (Signed)
History and Physical Interval Note:  08/11/2016 7:23 AM  Laura Walters  has presented today for surgery, with the diagnosis of NUCLEAR SCLEROTIC CATARACT LEFT EYE  The various methods of treatment have been discussed with the patient and family. After consideration of risks, benefits and other options for treatment, the patient has consented to  Procedure(s) with comments: CATARACT EXTRACTION PHACO AND INTRAOCULAR LENS PLACEMENT (IOC) (Left) - [place] AP% CDE FLUID PACK LOT # [id number] as a surgical intervention .  The patient's history has been reviewed, patient examined, no change in status, stable for surgery.  I have reviewed the patient's chart and labs.  Questions were answered to the patient's satisfaction.     Tamel Abel

## 2016-08-11 NOTE — Transfer of Care (Signed)
Immediate Anesthesia Transfer of Care Note  Patient: Laura Walters  Procedure(s) Performed: Procedure(s) with comments: CATARACT EXTRACTION PHACO AND INTRAOCULAR LENS PLACEMENT (IOC) (Left) - [place] 1.24 AP% 26.1 CDE 37.79 FLUID PACK LOT # [id number]  Patient Location: PACU  Anesthesia Type:MAC  Level of Consciousness: awake and alert   Airway & Oxygen Therapy: Patient Spontanous Breathing  Post-op Assessment: Report given to RN  Post vital signs: Reviewed and stable  Last Vitals:  Vitals:   08/11/16 0920 08/11/16 0924  BP: (!) 168/78 (!) 146/64  Pulse: 65 80  Resp: 16 20  Temp: 36.6 C 37 C    Last Pain:  Vitals:   08/11/16 0707  TempSrc: Oral         Complications: No apparent anesthesia complications

## 2016-08-11 NOTE — Anesthesia Postprocedure Evaluation (Signed)
Anesthesia Post Note  Patient: Laura Walters  Procedure(s) Performed: Procedure(s) (LRB): CATARACT EXTRACTION PHACO AND INTRAOCULAR LENS PLACEMENT (IOC) (Left)  Patient location during evaluation: PACU Anesthesia Type: General Level of consciousness: awake Pain management: pain level controlled Vital Signs Assessment: post-procedure vital signs reviewed and stable Respiratory status: spontaneous breathing Cardiovascular status: stable Anesthetic complications: no    Last Vitals:  Vitals:   08/11/16 0920 08/11/16 0924  BP: (!) 168/78 (!) 146/64  Pulse: 65 80  Resp: 16 20  Temp: 36.6 C 37 C    Last Pain:  Vitals:   08/11/16 0707  TempSrc: Oral                 VAN STAVEREN,Kyree Adriano

## 2016-08-11 NOTE — Anesthesia Preprocedure Evaluation (Signed)
Anesthesia Evaluation  Patient identified by MRN, date of birth, ID band Patient awake    Reviewed: Allergy & Precautions, NPO status , Patient's Chart, lab work & pertinent test results  Airway Mallampati: II       Dental  (+) Upper Dentures, Lower Dentures   Pulmonary neg pulmonary ROS,    breath sounds clear to auscultation       Cardiovascular Exercise Tolerance: Good hypertension, Pt. on medications  Rhythm:Regular     Neuro/Psych negative neurological ROS     GI/Hepatic Neg liver ROS, GERD  Medicated,  Endo/Other  Hypothyroidism   Renal/GU negative Renal ROS     Musculoskeletal   Abdominal Normal abdominal exam  (+)   Peds negative pediatric ROS (+)  Hematology   Anesthesia Other Findings   Reproductive/Obstetrics                             Anesthesia Physical Anesthesia Plan  ASA: III  Anesthesia Plan: MAC   Post-op Pain Management:    Induction: Intravenous  Airway Management Planned: Natural Airway and Nasal Cannula  Additional Equipment:   Intra-op Plan:   Post-operative Plan:   Informed Consent: I have reviewed the patients History and Physical, chart, labs and discussed the procedure including the risks, benefits and alternatives for the proposed anesthesia with the patient or authorized representative who has indicated his/her understanding and acceptance.     Plan Discussed with: CRNA  Anesthesia Plan Comments:         Anesthesia Quick Evaluation

## 2016-08-11 NOTE — Op Note (Signed)
Date of Surgery: 08/11/2016 Date of Dictation: 08/11/2016 9:20 AM Pre-operative Diagnosis:  Nuclear Sclerotic Cataract left Eye Post-operative Diagnosis: same Procedure performed: Extra-capsular Cataract Extraction (ECCE) with placement of a posterior chamber intraocular lens (IOL) left Eye IOL:  Implant Name Type Inv. Item Serial No. Manufacturer Lot No. LRB No. Used  LENS IOL ACRYSOF IQ 20.5 - [id number] Intraocular Lens LENS IOL ACRYSOF IQ 20.5 [id number] ALCON   Left 1   Anesthesia: 2% Lidocaine and 4% Marcaine in a 50/50 mixture with 10 unites/ml of Hylenex given as a peribulbar Anesthesiologist: Anesthesiologist: Gijsbertus Lonia Mad, MD CRNA: Lesle Reek, CRNA; Nelda Marseille, CRNA Complications: none Estimated Blood Loss: less than 1 ml  Description of procedure:  The patient was given anesthesia and sedation via intravenous access. The patient was then prepped and draped in the usual fashion. A 25-gauge needle was bent for initiating the capsulorhexis. A 5-0 silk suture was placed through the conjunctiva superior and inferiorly to serve as bridle sutures. Hemostasis was obtained at the superior limbus using an eraser cautery. A partial thickness groove was made at the anterior surgical limbus with a 64 Beaver blade and this was dissected anteriorly with an Avaya. The anterior chamber was entered at 10 o'clock with a 1.0 mm paracentesis knife and through the lamellar dissection with a 2.6 mm Alcon keratome. Epi-Shugarcaine 0.5 CC [9 cc BSS Plus (Alcon), 3 cc 4% preservative-free lidocaine (Hospira) and 4 cc 1:1000 preservative-free, bisulfite-free epinephrine] was injected into the anterior chamber via the paracentesis tract. Epi-Shugarcaine 0.5 CC [9 cc BSS Plus (Alcon), 3 cc 4% preservative-free lidocaine (Hospira) and 4 cc 1:1000 preservative-free, bisulfite-free epinephrine] was injected into the anterior chamber via the paracentesis tract. DiscoVisc was injected  to replace the aqueous and a continuous tear curvilinear capsulorhexis was performed using a bent 25-gauge needle.  Balance salt on a syringe was used to perform hydro-dissection and phacoemulsification was carried out using a divide and conquer technique. Procedure(s) with comments: CATARACT EXTRACTION PHACO AND INTRAOCULAR LENS PLACEMENT (IOC) (Left) - [place] 1.24 AP% 26.1 CDE 37.79 FLUID PACK LOT # [id number]. Irrigation/aspiration was used to remove the residual cortex and the capsular bag was inflated with DiscoVisc. The intraocular lens was inserted into the capsular bag using a pre-loaded UltraSert Delivery System. Irrigation/aspiration was used to remove the residual DiscoVisc. The wound was inflated with balanced salt and checked for leaks. None were found. Miostat was injected via the paracentesis track and 0.1 ml of Vigamox containing 1 mg of drug  was injected via the paracentesis track. The wound was checked for leaks again and none were found.   The bridal sutures were removed and two drops of Vigamox were placed on the eye. An eye shield was placed to protect the eye and the patient was discharged to the recovery area in good condition.   Deondra Labrador MD

## 2016-08-12 LAB — URINE CULTURE

## 2016-08-24 ENCOUNTER — Ambulatory Visit
Admission: RE | Admit: 2016-08-24 | Discharge: 2016-08-24 | Disposition: A | Discharge status: Home / Self Care | Payer: Medicare Other | Source: Ambulatory Visit | Attending: Internal Medicine | Admitting: Internal Medicine

## 2016-08-24 DIAGNOSIS — Z1231 Encounter for screening mammogram for malignant neoplasm of breast: Secondary | ICD-10-CM

## 2016-10-05 ENCOUNTER — Ambulatory Visit (INDEPENDENT_AMBULATORY_CARE_PROVIDER_SITE_OTHER): Payer: Medicare Other | Admitting: Obstetrics and Gynecology

## 2016-10-05 ENCOUNTER — Encounter: Payer: Self-pay | Admitting: Obstetrics and Gynecology

## 2016-10-05 VITALS — Ht 60.0 in | Wt 134.7 lb

## 2016-10-05 DIAGNOSIS — N390 Urinary tract infection, site not specified: Secondary | ICD-10-CM

## 2016-10-05 DIAGNOSIS — R351 Nocturia: Secondary | ICD-10-CM

## 2016-10-05 DIAGNOSIS — N816 Rectocele: Secondary | ICD-10-CM

## 2016-10-05 DIAGNOSIS — K469 Unspecified abdominal hernia without obstruction or gangrene: Secondary | ICD-10-CM | POA: Diagnosis not present

## 2016-10-05 LAB — POCT URINALYSIS DIPSTICK
BILIRUBIN UA: NEGATIVE
Glucose, UA: NEGATIVE
KETONES UA: NEGATIVE
Nitrite, UA: NEGATIVE
PH UA: 7.5
PROTEIN UA: NEGATIVE
RBC UA: NEGATIVE
SPEC GRAV UA: 1.01
Urobilinogen, UA: NEGATIVE

## 2016-10-05 NOTE — Progress Notes (Signed)
Chief complaint: 1. Pessary maintenance 2. Pelvic organ prolapse  8 week pessary check (last visit 08/10/2016)  Patient is using gel horn pessary for management of rectocele and uterine prolapse. She denies vaginal bleeding or discharge. She denies pelvic pain. She does have occasional UTI symptoms, along with history of recurrent UTIs.   Gynecologic History No LMP recorded. Patient is postmenopausal. Contraception: post menopausal status Last Pap: n/a. Results were: n/a Last mammogram: 08/13/15. Results were: normal  OBJECTIVE: Ht 5' (1.524 m)   Wt 134 lb 11.2 oz (61.1 kg)   BMI 26.31 kg/m  CONSTITUTIONAL: Well-developed, well-nourished female in no acute distress.  HENT:  Normocephalic, atraumatic.  NECK: Not examined.  SKIN: Skin is warm and dry. No rash noted. Not diaphoretic. No erythema. No pallor. [place]: Alert and oriented to person, place, and time. PSYCHIATRIC: Normal mood and affect. Normal behavior. Normal judgment and thought content. CARDIOVASCULAR:Not Examined RESPIRATORY: Not Examined BREASTS: Not Examined ABDOMEN: Soft, non distended; Non tender.  No Organomegaly. PELVIC:             External Genitalia: Normal             BUS: Normal             Vagina: Mild hyperemia left side of vagina near cervix             Cervix: Normal             Uterus: Normal size, shape,consistency, mobile, nontender             Adnexa: Normal; nonpalpable and nontender             RV: Normal external exam             Bladder: Nontender MUSCULOSKELETAL: Normal range of motion. No tenderness.  No cyanosis, clubbing, or edema.  PROCEDURE: Pessary is removed, cleaned, and reinserted  ASSESSMENT: 1. Pessary maintenance 2. Cystocele 3. Rectocele 4. UTI symptoms- urgency  PLAN: 1. Return in 8 weeks for pessary maintenance 2. Continue with Trimosan intravaginal weekly 3. Urinalysis and urine culture are obtained  A total of 15 minutes were spent face-to-face with the  patient during this encounter and over half of that time dealt with counseling and coordination of care.   Joyice Faster, CMA  Brayton Mars, MD   Note: This dictation was prepared with Dragon dictation along with smaller phrase technology. Any transcriptional errors that result from this process are unintentional.

## 2016-10-05 NOTE — Patient Instructions (Signed)
1.  Return in 8 weeks for pessary maintenance 

## 2016-10-07 LAB — URINE CULTURE

## 2016-10-08 ENCOUNTER — Telehealth: Payer: Self-pay

## 2016-10-08 MED ORDER — CEPHALEXIN 500 MG PO CAPS: 500.0000 mg | ORAL_CAPSULE | Freq: Two times a day (BID) | ORAL | 0 refills | Status: DC

## 2016-10-08 NOTE — Telephone Encounter (Signed)
-----   Message from Brayton Mars, MD sent at 10/08/2016  8:01 AM EST ----- Please notify - Abnormal Labs Please call in: Keflex 500 mg twice a day for 7 days

## 2016-10-08 NOTE — Telephone Encounter (Signed)
Keflex has an contraindication with pts pcn allergy. Pt would like another drug. AC advised pt it may cause a reaction but it may not. Advised pt to try and if she develops hives she may take benadryl. [place]. MED erx.

## 2016-11-11 DIAGNOSIS — K219 Gastro-esophageal reflux disease without esophagitis: Secondary | ICD-10-CM | POA: Insufficient documentation

## 2016-11-30 ENCOUNTER — Ambulatory Visit (INDEPENDENT_AMBULATORY_CARE_PROVIDER_SITE_OTHER): Payer: Medicare Other | Admitting: Obstetrics and Gynecology

## 2016-11-30 ENCOUNTER — Encounter: Payer: Medicare Other | Admitting: Obstetrics and Gynecology

## 2016-11-30 ENCOUNTER — Encounter: Payer: Self-pay | Admitting: Obstetrics and Gynecology

## 2016-11-30 VITALS — BP 118/72 | HR 70 | Ht 60.0 in | Wt 132.1 lb

## 2016-11-30 DIAGNOSIS — Z4689 Encounter for fitting and adjustment of other specified devices: Secondary | ICD-10-CM

## 2016-11-30 DIAGNOSIS — N390 Urinary tract infection, site not specified: Secondary | ICD-10-CM | POA: Diagnosis not present

## 2016-11-30 DIAGNOSIS — N816 Rectocele: Secondary | ICD-10-CM

## 2016-11-30 DIAGNOSIS — N8111 Cystocele, midline: Secondary | ICD-10-CM

## 2016-11-30 DIAGNOSIS — K469 Unspecified abdominal hernia without obstruction or gangrene: Secondary | ICD-10-CM | POA: Diagnosis not present

## 2016-11-30 LAB — POCT URINALYSIS DIPSTICK
BILIRUBIN UA: NEGATIVE
GLUCOSE UA: NEGATIVE
Ketones, UA: NEGATIVE
NITRITE UA: POSITIVE
Protein, UA: NEGATIVE
RBC UA: NEGATIVE
Spec Grav, UA: <=1.005
UROBILINOGEN UA: NEGATIVE
pH, UA: 7.5

## 2016-11-30 MED ORDER — NITROFURANTOIN MONOHYD MACRO 100 MG PO CAPS: 100.0000 mg | ORAL_CAPSULE | Freq: Two times a day (BID) | ORAL | 0 refills | Status: DC

## 2016-11-30 NOTE — Patient Instructions (Signed)
1. Return in 8 weeks for pessary maintenance 2. Continue using Trimosan gel intravaginally once a week

## 2016-11-30 NOTE — Progress Notes (Signed)
Chief complaint: 1. Pessary maintenance 2. Pelvic organ prolapse  8 week pessary check (last visit 10/05/2016)  Patient is using gel horn pessary for management of rectocele and uterine prolapse. She denies vaginal bleeding or discharge. She denies pelvic pain. She does have occasional UTI symptoms, along with history of recurrent UTIs. Urinalysis today is suspicious for UTI   Gynecologic History No LMP recorded. Patient is postmenopausal. Contraception: post menopausal status Last Pap: n/a. Results were: n/a Last mammogram: 08/24/2016. Results were: normal-BI-RADS 1  OBJECTIVE: BP 118/72   Pulse 70   Ht 5' (1.524 m)   Wt 132 lb 1.6 oz (59.9 kg)   BMI 25.80 kg/m  CONSTITUTIONAL: Well-developed, well-nourished female in no acute distress.  HENT: Normocephalic, atraumatic.  NECK: Not examined.  SKIN: Skin is warm and dry. No rash noted. Not diaphoretic. No erythema. No pallor. [place]: Alert and oriented to person, place, and time. PSYCHIATRIC: Normal mood and affect. Normal behavior. Normal judgment and thought content. CARDIOVASCULAR:Not Examined RESPIRATORY: Not Examined BREASTS: Not Examined ABDOMEN: Soft, non distended; Non tender. No Organomegaly. PELVIC: External Genitalia: Normal BUS: Normal Vagina: Mild hyperemia left side of vagina near cervix Cervix: Normal; hyperemia at 11:00 and 7:00 on the cervix without ulceration Uterus: Normal size, shape,consistency, mobile,nontender Adnexa: Normal;nonpalpable and nontender RV: Normal external exam Bladder: Nontender MUSCULOSKELETAL: Normal range of motion. No tenderness. No cyanosis, clubbing, or edema.  PROCEDURE: Pessary is removed, cleaned, and reinserted  ASSESSMENT: 1. Pessary maintenance 2. Cystocele 3. Rectocele 4. UTI   PLAN: 1. Return in 8 weeks for pessary maintenance 2. Continue with Trimosan  intravaginal weekly 3. Urinalysis and urine culture are obtained  4. Macrobid twice a day for 7 days  Brayton Mars, MD  Note: This dictation was prepared with Dragon dictation along with smaller phrase technology. Any transcriptional errors that result from this process are unintentional.

## 2016-12-02 LAB — URINE CULTURE

## 2016-12-31 ENCOUNTER — Ambulatory Visit
Admission: RE | Admit: 2016-12-31 | Discharge: 2016-12-31 | Disposition: A | Discharge status: Home / Self Care | Payer: Medicare Other | Source: Ambulatory Visit | Attending: Hematology and Oncology | Admitting: Hematology and Oncology

## 2016-12-31 DIAGNOSIS — M4186 Other forms of scoliosis, lumbar region: Secondary | ICD-10-CM | POA: Diagnosis not present

## 2016-12-31 DIAGNOSIS — C73 Malignant neoplasm of thyroid gland: Secondary | ICD-10-CM | POA: Insufficient documentation

## 2016-12-31 DIAGNOSIS — K5730 Diverticulosis of large intestine without perforation or abscess without bleeding: Secondary | ICD-10-CM | POA: Insufficient documentation

## 2016-12-31 DIAGNOSIS — I700 Atherosclerosis of aorta: Secondary | ICD-10-CM | POA: Insufficient documentation

## 2016-12-31 DIAGNOSIS — C770 Secondary and unspecified malignant neoplasm of lymph nodes of head, face and neck: Secondary | ICD-10-CM | POA: Diagnosis not present

## 2016-12-31 DIAGNOSIS — I2510 Atherosclerotic heart disease of native coronary artery without angina pectoris: Secondary | ICD-10-CM | POA: Diagnosis not present

## 2016-12-31 DIAGNOSIS — K449 Diaphragmatic hernia without obstruction or gangrene: Secondary | ICD-10-CM | POA: Insufficient documentation

## 2016-12-31 DIAGNOSIS — J324 Chronic pansinusitis: Secondary | ICD-10-CM | POA: Insufficient documentation

## 2016-12-31 DIAGNOSIS — R220 Localized swelling, mass and lump, head: Secondary | ICD-10-CM | POA: Diagnosis not present

## 2016-12-31 DIAGNOSIS — R918 Other nonspecific abnormal finding of lung field: Secondary | ICD-10-CM | POA: Insufficient documentation

## 2016-12-31 DIAGNOSIS — C7800 Secondary malignant neoplasm of unspecified lung: Secondary | ICD-10-CM | POA: Diagnosis not present

## 2016-12-31 LAB — GLUCOSE, CAPILLARY: Glucose-Capillary: 93 mg/dL (ref 65–99)

## 2016-12-31 MED ORDER — FLUDEOXYGLUCOSE F - 18 (FDG) INJECTION
12.7600 | Freq: Once | INTRAVENOUS | Status: AC | PRN
  Administered 2016-12-31: 12.76 via INTRAVENOUS

## 2017-01-03 ENCOUNTER — Inpatient Hospital Stay (HOSPITAL_BASED_OUTPATIENT_CLINIC_OR_DEPARTMENT_OTHER): Payer: Medicare Other | Admitting: Hematology and Oncology

## 2017-01-03 ENCOUNTER — Inpatient Hospital Stay: Payer: Medicare Other | Attending: Hematology and Oncology

## 2017-01-03 VITALS — BP 113/72 | HR 69 | Temp 96.0°F | Resp 18 | Wt 131.4 lb

## 2017-01-03 DIAGNOSIS — M549 Dorsalgia, unspecified: Secondary | ICD-10-CM | POA: Insufficient documentation

## 2017-01-03 DIAGNOSIS — K449 Diaphragmatic hernia without obstruction or gangrene: Secondary | ICD-10-CM | POA: Insufficient documentation

## 2017-01-03 DIAGNOSIS — I10 Essential (primary) hypertension: Secondary | ICD-10-CM | POA: Insufficient documentation

## 2017-01-03 DIAGNOSIS — K589 Irritable bowel syndrome without diarrhea: Secondary | ICD-10-CM | POA: Insufficient documentation

## 2017-01-03 DIAGNOSIS — C7800 Secondary malignant neoplasm of unspecified lung: Secondary | ICD-10-CM | POA: Insufficient documentation

## 2017-01-03 DIAGNOSIS — Z79899 Other long term (current) drug therapy: Secondary | ICD-10-CM | POA: Diagnosis not present

## 2017-01-03 DIAGNOSIS — K219 Gastro-esophageal reflux disease without esophagitis: Secondary | ICD-10-CM | POA: Insufficient documentation

## 2017-01-03 DIAGNOSIS — Z886 Allergy status to analgesic agent status: Secondary | ICD-10-CM | POA: Insufficient documentation

## 2017-01-03 DIAGNOSIS — C73 Malignant neoplasm of thyroid gland: Secondary | ICD-10-CM | POA: Insufficient documentation

## 2017-01-03 DIAGNOSIS — D497 Neoplasm of unspecified behavior of endocrine glands and other parts of nervous system: Secondary | ICD-10-CM

## 2017-01-03 DIAGNOSIS — C778 Secondary and unspecified malignant neoplasm of lymph nodes of multiple regions: Secondary | ICD-10-CM

## 2017-01-03 DIAGNOSIS — K5730 Diverticulosis of large intestine without perforation or abscess without bleeding: Secondary | ICD-10-CM | POA: Diagnosis not present

## 2017-01-03 DIAGNOSIS — Z880 Allergy status to penicillin: Secondary | ICD-10-CM | POA: Insufficient documentation

## 2017-01-03 DIAGNOSIS — M5136 Other intervertebral disc degeneration, lumbar region: Secondary | ICD-10-CM

## 2017-01-03 DIAGNOSIS — C770 Secondary and unspecified malignant neoplasm of lymph nodes of head, face and neck: Secondary | ICD-10-CM

## 2017-01-03 DIAGNOSIS — E890 Postprocedural hypothyroidism: Secondary | ICD-10-CM | POA: Insufficient documentation

## 2017-01-03 LAB — COMPREHENSIVE METABOLIC PANEL
ALT: 20 U/L (ref 14–54)
AST: 34 U/L (ref 15–41)
Albumin: 4 g/dL (ref 3.5–5.0)
Alkaline Phosphatase: 129 U/L — ABNORMAL HIGH (ref 38–126)
Anion gap: 12 (ref 5–15)
BUN: 26 mg/dL — ABNORMAL HIGH (ref 6–20)
CO2: 26 mmol/L (ref 22–32)
Calcium: 8.3 mg/dL — ABNORMAL LOW (ref 8.9–10.3)
Chloride: 94 mmol/L — ABNORMAL LOW (ref 101–111)
Creatinine, Ser: 1.2 mg/dL — ABNORMAL HIGH (ref 0.44–1.00)
GFR calc Af Amer: 48 mL/min — ABNORMAL LOW (ref >60)
GFR calc non Af Amer: 41 mL/min — ABNORMAL LOW (ref >60)
Glucose, Bld: 105 mg/dL — ABNORMAL HIGH (ref 65–99)
Potassium: 3.7 mmol/L (ref 3.5–5.1)
Sodium: 132 mmol/L — ABNORMAL LOW (ref 135–145)
Total Bilirubin: 0.6 mg/dL (ref 0.3–1.2)
Total Protein: 7.3 g/dL (ref 6.5–8.1)

## 2017-01-03 LAB — CBC WITH DIFFERENTIAL/PLATELET
Basophils Absolute: 0 10*3/uL (ref 0–0.1)
Basophils Relative: 1 %
Eosinophils Absolute: 0.8 10*3/uL — ABNORMAL HIGH (ref 0–0.7)
Eosinophils Relative: 11 %
HCT: 37.6 % (ref 35.0–47.0)
Hemoglobin: 13 g/dL (ref 12.0–16.0)
Lymphocytes Relative: 16 %
Lymphs Abs: 1.2 10*3/uL (ref 1.0–3.6)
MCH: 30.7 pg (ref 26.0–34.0)
MCHC: 34.6 g/dL (ref 32.0–36.0)
MCV: 88.7 fL (ref 80.0–100.0)
Monocytes Absolute: 1 10*3/uL — ABNORMAL HIGH (ref 0.2–0.9)
Monocytes Relative: 13 %
Neutro Abs: 4.5 10*3/uL (ref 1.4–6.5)
Neutrophils Relative %: 59 %
Platelets: 271 10*3/uL (ref 150–440)
RBC: 4.24 MIL/uL (ref 3.80–5.20)
RDW: 14.5 % (ref 11.5–14.5)
WBC: 7.5 10*3/uL (ref 3.6–11.0)

## 2017-01-03 LAB — TSH: TSH: 5.524 u[IU]/mL — ABNORMAL HIGH (ref 0.350–4.500)

## 2017-01-03 NOTE — Progress Notes (Signed)
[facility] day:  01/03/17  Chief Complaint: Laura Walters is a 83 y.o. female with thyroid carcinoma who is seen for 6 month assessment.  HPI:  The patient was last seen in the medical oncology clinic on 07/05/2016  At that time, she denied any respiratory symptoms.  She had intermittent right hand swelling.  We discussed continued observation given her age an asymptomatic pulmonary nodules.  PET scan on 12/31/2016 revealed generally similar appearance, with several abnormal hypermetabolic lymph nodes along the thoracic inlet level, and scattered hypermetabolic pulmonary nodules favoring the lung bases. On balance, the overall tumor burden was not significantly changed.  There was a mass arising from the left side of the sella turcica intracranially which probably has some low-grade metabolic activity.  This measured about 3.1 x 1.7 cm, but is only partially included and not in the high-resolution fashion.  Correlate with the patient's history in determining whether follow up pituitary protocol MRI with and without contrast was warranted.Other imaging findings of potential clinical significance included acute on chronic paranasal sinusitis.   There was coronary, aortic arch, and branch vessel atherosclerotic vascular disease.   There was a small to moderate-sized hiatal hernia, sigmoid colon diverticulosis, and dextroconvex lumbar scoliosis with rotary component.  She states that Dr. Doy Hutching has been adjusting her Synthroid. Dose went from 100 g to 50 g and now 75 g. At 50 g she felt terrible.   Regarding her history of pituitary adenoma, she was previously seen at [facility] by Dr. Derrill Memo.  She has not had follow-up in some time.  She denies any headache or visual changes.   Past Medical History:  Diagnosis Date  . Arthritis   . Asthma   . Back pain   . Cancer of thyroid ([place]) 03/30/2015  . Cardiomyopathy ([place])    idiopathic  . Chest pain     non cardiac  . Chokes    easily   swallow test negative  . Constipation   . Cystocele   . DDD (degenerative disc disease), lumbar   . Disc disorder   . GERD (gastroesophageal reflux disease)   . H/O wheezing    advair as needed  . Hypertension   . Hypothyroidism   . IBS (irritable bowel syndrome)   . Low back pain   . Neuropathy ([place])   . Nocturia   . Pelvic pain in female   . RAD (reactive airway disease)   . Rectocele   . Seasonal allergies   . Spinal stenosis   . Urge incontinence   . Vaginal atrophy   . Vaginal polyp   . Vulvitis     Past Surgical History:  Procedure Laterality Date  . BREAST BIOPSY Right 86 and 92   2 bx-neg  . CATARACT EXTRACTION W/PHACO Right 05/19/2016   Procedure: CATARACT EXTRACTION PHACO AND INTRAOCULAR LENS PLACEMENT (IOC);  Surgeon: Estill Cotta, MD;  Location: [facility] ORS;  Service: Ophthalmology;  Laterality: Right;  Lot# [id number] [place]:   1:25.3 AP%:  24.9% CDE:  40.11  . CATARACT EXTRACTION W/PHACO Left 08/11/2016   Procedure: CATARACT EXTRACTION PHACO AND INTRAOCULAR LENS PLACEMENT (IOC);  Surgeon: Estill Cotta, MD;  Location: [facility] ORS;  Service: Ophthalmology;  Laterality: Left;  [place] 1.24 AP% 26.1 CDE 37.79 FLUID PACK LOT # [id number]  . DILATION AND CURETTAGE OF UTERUS    . HEMORROIDECTOMY     x 2  . JOINT REPLACEMENT     left hip  .  KNEE ARTHROPLASTY Right 01/05/2016   Procedure: COMPUTER ASSISTED TOTAL KNEE ARTHROPLASTY;  Surgeon: Dereck Leep, MD;  Location: [facility] ORS;  Service: Orthopedics;  Laterality: Right;  . KNEE ARTHROSCOPY Right 05/19/2015   Procedure: Right knee arthrosocpy medail and lateral menisectomy, chondroplasty ;  Surgeon: Dereck Leep, MD;  Location: [facility] ORS;  Service: Orthopedics;  Laterality: Right;  . Left total hip arthroplasty    . PAROTIDECTOMY    . PITUITARY SURGERY    . THYROID SURGERY     bx  . THYROIDECTOMY      Family History  Problem Relation Age of Onset  . Heart disease Father   .  Diabetes Paternal Uncle   . Cancer Neg Hx   . Breast cancer Neg Hx     Social History:  reports that she has never smoked. She has never used smokeless tobacco. She reports that she does not drink alcohol or use drugs.  The patient lives alone in [place]. She has a son named Child psychotherapist.  The patient is accompanied by her 2nd daughter, [name], today.  Allergies:  Allergies  Allergen Reactions  . Aleve [Naproxen]   . Codeine Hives and Itching  . Penicillins Hives    Has patient had a PCN reaction causing immediate rash, facial/tongue/throat swelling, SOB or lightheadedness with hypotension: hives Has patient had a PCN reaction causing severe rash involving mucus membranes or skin necrosis: no Has patient had a PCN reaction that required hospitalization no Has patient had a PCN reaction occurring within the last 10 years: no If all of the above answers are "NO", then may proceed with Cephalosporin use.  . Sulfa Antibiotics Hives    Current Medications: Current Outpatient Prescriptions  Medication Sig Dispense Refill  . carvedilol (COREG) 3.125 MG tablet Take 3.125 mg by mouth 2 (two) times daily with a meal.     . DULoxetine (CYMBALTA) 60 MG capsule Take 60 mg by mouth daily.     . ferrous sulfate 325 (65 FE) MG tablet Take 325 mg by mouth 2 (two) times daily with a meal.     . fexofenadine-pseudoephedrine (ALLEGRA-D 24) 180-240 MG 24 hr tablet Take 1 tablet by mouth at bedtime. hs    . fluticasone (FLONASE) 50 MCG/ACT nasal spray Place 2 sprays into the nose daily as needed for allergies.     . Fluticasone-Salmeterol (ADVAIR DISKUS) 250-50 MCG/DOSE AEPB Inhale 1 puff into the lungs 2 (two) times daily.    . furosemide (LASIX) 20 MG tablet TAKE 1 TABLET BY MOUTH TWICE DAILY.    Marland Kitchen gabapentin (NEURONTIN) 400 MG capsule Take 800 mg by mouth 3 (three) times daily.     Marland Kitchen levothyroxine (SYNTHROID, LEVOTHROID) 75 MCG tablet Take 75 mcg by mouth daily.    . Multiple Vitamins-Minerals (PRESERVISION AREDS  2) CAPS Take 1 capsule by mouth 2 (two) times daily.    . nitrofurantoin, macrocrystal-monohydrate, (MACROBID) 100 MG capsule Take 1 capsule (100 mg total) by mouth 2 (two) times daily. 14 capsule 0  . omeprazole (PRILOSEC) 40 MG capsule Take 40 mg by mouth daily.     . potassium chloride (K-DUR,KLOR-CON) 10 MEQ tablet Take 10 mEq by mouth daily.     . traMADol (ULTRAM) 50 MG tablet Take 1-2 tablets (50-100 mg total) by mouth every 4 (four) hours as needed for moderate pain. 60 tablet 0  . ranitidine (ZANTAC) 150 MG tablet Take 150 mg by mouth daily.     No current facility-administered medications for this visit.  Review of Systems:  GENERAL:  Feels "fine".  No fevers or sweats.  Weight down 1 pound. PERFORMANCE STATUS (ECOG):  1 HEENT:  No visual changes, runny nose, sore throat, mouth sores or tenderness. Lungs: No shortness of breath or cough.  No hemoptysis. Cardiac:  No chest pain, palpitations, orthopnea, or PND. GI:  No nausea, vomiting, diarrhea, constipation, melena or hematochezia. GU:  No urgency, frequency, dysuria, or hematuria. Musculoskeletal:  Chronic back pain secondary to stenosis.  Arthritis.  No muscle tenderness. Extremities:  Intermittent swelling of right hand.  No pain. Skin:  No rashes or skin changes. Neuro:  No headache, numbness or weakness, balance or coordination issues. Endocrine:  No diabetes.  On Synthroid s/p thyroidectomy for thyroid cancer (see HPI).  No hot flashes or night sweats. Psych:  No mood changes, depression or anxiety. Pain:  Back pain. Review of systems:  All other systems reviewed and found to be negative.  Physical Exam: Blood pressure 113/72, pulse 69, temperature (!) 96 F (35.6 C), temperature source Tympanic, resp. rate 18, weight 131 lb 7 oz (59.6 kg). GENERAL:  Well developed, well nourished, elderly woman sitting comfortably in the exam room in no acute distress.  She has a cane at her side. MENTAL STATUS:  Alert and oriented  to person, place and time. HEAD:  Curly gray hair.  Normocephalic, atraumatic, face symmetric, no Cushingoid features. EYES:  Glasses.  Blue eyes.  Pupils equal round and reactive to light and accomodation.  No conjunctivitis or scleral icterus. ENT:  Oropharynx clear without lesion.  Tongue normal. Mucous membranes dry.  RESPIRATORY:  Clear to auscultation without rales, wheezes or rhonchi. CARDIOVASCULAR:  Regular rate and rhythm without murmur, rub or gallop. ABDOMEN:  Soft, non-tender, with active bowel sounds, and no hepatosplenomegaly.  No masses. SKIN:  No rashes, ulcers or lesions. EXTREMITIES: No edema, no skin discoloration or tenderness.  No palpable cords. LYMPH NODES: No palpable cervical, supraclavicular, axillary or inguinal adenopathy  NEUROLOGICAL: Unremarkable. PSYCH:  Appropriate.   Appointment on 01/03/2017  Component Date Value Ref Range Status  . WBC 01/03/2017 7.5  3.6 - 11.0 K/uL Final  . RBC 01/03/2017 4.24  3.80 - 5.20 MIL/uL Final  . Hemoglobin 01/03/2017 13.0  12.0 - 16.0 g/dL Final  . HCT 01/03/2017 37.6  35.0 - 47.0 % Final  . MCV 01/03/2017 88.7  80.0 - 100.0 fL Final  . MCH 01/03/2017 30.7  26.0 - 34.0 pg Final  . MCHC 01/03/2017 34.6  32.0 - 36.0 g/dL Final  . RDW 01/03/2017 14.5  11.5 - 14.5 % Final  . Platelets 01/03/2017 271  150 - 440 K/uL Final  . Neutrophils Relative % 01/03/2017 59  % Final  . Neutro Abs 01/03/2017 4.5  1.4 - 6.5 K/uL Final  . Lymphocytes Relative 01/03/2017 16  % Final  . Lymphs Abs 01/03/2017 1.2  1.0 - 3.6 K/uL Final  . Monocytes Relative 01/03/2017 13  % Final  . Monocytes Absolute 01/03/2017 1.0* 0.2 - 0.9 K/uL Final  . Eosinophils Relative 01/03/2017 11  % Final  . Eosinophils Absolute 01/03/2017 0.8* 0 - 0.7 K/uL Final  . Basophils Relative 01/03/2017 1  % Final  . Basophils Absolute 01/03/2017 0.0  0 - 0.1 K/uL Final  . Sodium 01/03/2017 132* 135 - 145 mmol/L Final  . Potassium 01/03/2017 3.7  3.5 - 5.1 mmol/L Final   . Chloride 01/03/2017 94* 101 - 111 mmol/L Final  . CO2 01/03/2017 26  22 - 32  mmol/L Final  . Glucose, Bld 01/03/2017 105* 65 - 99 mg/dL Final  . BUN 01/03/2017 26* 6 - 20 mg/dL Final  . Creatinine, Ser 01/03/2017 1.20* 0.44 - 1.00 mg/dL Final  . Calcium 01/03/2017 8.3* 8.9 - 10.3 mg/dL Final  . Total Protein 01/03/2017 7.3  6.5 - 8.1 g/dL Final  . Albumin 01/03/2017 4.0  3.5 - 5.0 g/dL Final  . AST 01/03/2017 34  15 - 41 U/L Final  . ALT 01/03/2017 20  14 - 54 U/L Final  . Alkaline Phosphatase 01/03/2017 129* 38 - 126 U/L Final  . Total Bilirubin 01/03/2017 0.6  0.3 - 1.2 mg/dL Final  . GFR calc non Af Amer 01/03/2017 41* >60 mL/min Final  . GFR calc Af Amer 01/03/2017 48* >60 mL/min Final   Comment: (NOTE) The eGFR has been calculated using the CKD EPI equation. This calculation has not been validated in all clinical situations. eGFR's persistently <60 mL/min signify possible Chronic Kidney Disease.   . Anion gap 01/03/2017 12  5 - 15 Final    Assessment:  ZYLEE MARCHIANO is a 83 y.o. female with a recurrent thyroid cancer.  She was diagnosed with thyroid carcinoma in 08/2002.  She underwent thyroidectomy and lymph node dissection.  She received I-131 in 10/2002.    Her thyroid cancer recurred in 2012. CT scans in 01/2011 revealed lung nodules.  She received I-131 in 08/2011.  Follow-up scans revealed stable disease.  PET scan on 01/01/2016 revealed progressive disease, as evidenced by increased size and hypermetabolism of cervical nodes, thoracic nodes, and pulmonary nodules.  There was a foci of osseous hypermetabolism (indeterminate). Right posterior ninth rib and transverse process hypermetabolism could be posttraumatic, given suggestion of nondisplaced rib fracture.  I-131 scan on 02/16/2016 revealed no evidence of I-131 avid thyroid carcinoma.  Ultrasound guided biopsy of the left supraclavicular lymph node on 03/24/2016 revealed metastatic papillary thyroid carcinoma.  Dr Doy Hutching  adjusts her Synthroid (current dose 75 mcg).  Chest CT on 07/02/2016 revealed stable metastatic disease to the lungs, with minimal enlargement of some pulmonary nodules, but no new pulmonary nodules. There was a new healing fracture of the anterior aspect of the left fifth rib.   PET scan on 12/31/2016 revealed generally similar appearance, with several abnormal hypermetabolic lymph nodes along the thoracic inlet level, and scattered hypermetabolic pulmonary nodules favoring the lung bases. The overall tumor burden has not significantly changed.  There was a 3.1 x 1.7 cm mass arising from the left side of the sella turcica intracranially which probably has some low-grade metabolic activity.   Symptomatically, she denies any respiratory symptoms.  She denies any neurologic symptoms.  Exam is stable.  Plan: 1.  Labs today:  CBC with diff, CMP, TSH, T4, thyroglobulin. 2.  Review PET scan.  Lung nodules and mediastinal lymph nodes are stable.  Discuss ongoing surveillance. 3.  Discuss mass in sella turcica.  Patient previously had pituitary surgery at [facility].  Patient has not been seen in follow-up at [facility] in some time.  Referral back to Dr. Tommi Rumps. 4.  Head MRI without contrast 5.  MD to contact Derrill Memo (neurosurgeon at [facility]) for follow-up imaging of pituitary tumor 6.  Schedule chest CT on 07/04/2017 7.  RTC for MD assessment, labs (CBC with diff, CMP, TSH, free T4) after chest CT.   Lequita Asal, MD  01/03/2017, 2:20 PM

## 2017-01-03 NOTE — Progress Notes (Signed)
Patient offers no complaints today.  Patient here today for PET results.  Would like a copy as well.

## 2017-01-04 LAB — T4: T4, Total: 5.3 ug/dL (ref 4.5–12.0)

## 2017-01-11 LAB — THYROGLOBULIN LEVEL: Thyroglobulin: 2 ng/mL

## 2017-01-12 ENCOUNTER — Ambulatory Visit
Admission: RE | Admit: 2017-01-12 | Discharge: 2017-01-12 | Disposition: A | Discharge status: Home / Self Care | Payer: Medicare Other | Source: Ambulatory Visit | Attending: Hematology and Oncology | Admitting: Hematology and Oncology

## 2017-01-12 DIAGNOSIS — C7800 Secondary malignant neoplasm of unspecified lung: Secondary | ICD-10-CM | POA: Insufficient documentation

## 2017-01-12 DIAGNOSIS — I6782 Cerebral ischemia: Secondary | ICD-10-CM | POA: Diagnosis not present

## 2017-01-12 DIAGNOSIS — D497 Neoplasm of unspecified behavior of endocrine glands and other parts of nervous system: Secondary | ICD-10-CM | POA: Insufficient documentation

## 2017-01-12 DIAGNOSIS — C73 Malignant neoplasm of thyroid gland: Secondary | ICD-10-CM | POA: Diagnosis not present

## 2017-01-12 DIAGNOSIS — C770 Secondary and unspecified malignant neoplasm of lymph nodes of head, face and neck: Secondary | ICD-10-CM | POA: Diagnosis not present

## 2017-01-25 ENCOUNTER — Encounter: Payer: Medicare Other | Admitting: Obstetrics and Gynecology

## 2017-02-02 ENCOUNTER — Encounter: Payer: Self-pay | Admitting: Hematology and Oncology

## 2017-02-03 ENCOUNTER — Encounter: Payer: Self-pay | Admitting: Obstetrics and Gynecology

## 2017-02-03 ENCOUNTER — Ambulatory Visit (INDEPENDENT_AMBULATORY_CARE_PROVIDER_SITE_OTHER): Payer: Medicare Other | Admitting: Obstetrics and Gynecology

## 2017-02-03 VITALS — BP 122/70 | HR 82 | Ht 60.0 in | Wt 132.2 lb

## 2017-02-03 DIAGNOSIS — N814 Uterovaginal prolapse, unspecified: Secondary | ICD-10-CM | POA: Insufficient documentation

## 2017-02-03 DIAGNOSIS — N816 Rectocele: Secondary | ICD-10-CM

## 2017-02-03 DIAGNOSIS — N390 Urinary tract infection, site not specified: Secondary | ICD-10-CM | POA: Diagnosis not present

## 2017-02-03 DIAGNOSIS — N8111 Cystocele, midline: Secondary | ICD-10-CM

## 2017-02-03 DIAGNOSIS — Z4689 Encounter for fitting and adjustment of other specified devices: Secondary | ICD-10-CM

## 2017-02-03 LAB — POCT URINALYSIS DIPSTICK
BILIRUBIN UA: NEGATIVE
Blood, UA: NEGATIVE
Glucose, UA: NEGATIVE
KETONES UA: NEGATIVE
Nitrite, UA: NEGATIVE
PROTEIN UA: NEGATIVE
Spec Grav, UA: 1.01 (ref 1.010–1.025)
Urobilinogen, UA: NEGATIVE E.U./dL — AB
pH, UA: 6.5 (ref 5.0–8.0)

## 2017-02-03 NOTE — Patient Instructions (Signed)
1.  Return in 12 weeks for pessary maintenance 

## 2017-02-03 NOTE — Progress Notes (Signed)
Chief complaint: 1. Pessary maintenance 2. Pelvic organ prolapse 3. Urge Incontinence  8 week pessary check (last visit 11/30/2016)  Patient is using gel horn pessary for management of rectocele and uterine prolapse. She denies vaginal bleeding or discharge. She denies pelvic pain. She states that she is wearing two large pads all the time due to urge incontinence.   Major concern today is the need for probable radiation for brain tumor recurrence from 2000 (pituitary tumor). Dr. Tommi Rumps of [facility] neurosurgery is wanting to do biopsy followed by radiation therapy; daughter wants patient to go to [facility] for second opinion. Gynecologic History No LMP recorded. Patient is postmenopausal. Contraception: post menopausal status Last Pap: n/a. Results were: n/a Last mammogram: 08/24/16. Results were: normal-BI-RADS 1  OBJECTIVE: BP 122/70   Pulse 82   Ht 5' (1.524 m)   Wt 132 lb 3.2 oz (60 kg)   BMI 25.82 kg/m   CONSTITUTIONAL: Well-developed, well-nourished female in no acute distress.  HENT: Normocephalic, atraumatic.  NECK: Not examined.  SKIN: Skin is warm and dry. No rash noted. Not diaphoretic. No erythema. No pallor. [place]: Alert and oriented to person, place, and time. PSYCHIATRIC: Normal mood and affect. Normal behavior. Normal judgment and thought content. CARDIOVASCULAR:Not Examined RESPIRATORY: Not Examined BREASTS: Not Examined ABDOMEN: Soft, non distended; Non tender. No Organomegaly. PELVIC: External Genitalia: Normal BUS: Normal Vagina: Mild hyperemia left side of vagina near cervix Cervix: Parous; healing ulceration on 9:00 position of cervix Uterus: Normal size, shape,consistency, mobile,nontender Adnexa: Normal;nonpalpable and nontender RV: Normal external exam Bladder: Nontender MUSCULOSKELETAL: Normal range of motion. No tenderness. No  cyanosis, clubbing, or edema.  PROCEDURE: Pessary is removed, cleaned, and reinserted  ASSESSMENT: 1. Pessary maintenance 2. Cystocele 3. Rectocele 4. Uterine prolapse 5. Urge incontinence 6. Return of pituitary tumor (adenoma) from 2000, needing radiation therapy  PLAN: 1. Return in 12 weeks for pessary maintenance 2. Continue with Trimosan gel intravaginal weekly  A total of 15 minutes were spent face-to-face with the patient during this encounter and over half of that time dealt with counseling and coordination of care.  Brayton Mars, MD   I have seen, interviewed, and examined the patient in conjunction with the [facility]. student and affirm the diagnosis and management plan. Martin A. DeFrancesco, MD, FACOG   Note: This dictation was prepared with Dragon dictation along with smaller phrase technology. Any transcriptional errors that result from this process are unintentional.

## 2017-02-05 LAB — URINE CULTURE

## 2017-03-10 ENCOUNTER — Other Ambulatory Visit
Admission: RE | Admit: 2017-03-10 | Discharge: 2017-03-10 | Disposition: A | Discharge status: Home / Self Care | Payer: Medicare Other | Source: Ambulatory Visit | Attending: Endocrinology | Admitting: Endocrinology

## 2017-03-10 DIAGNOSIS — D497 Neoplasm of unspecified behavior of endocrine glands and other parts of nervous system: Secondary | ICD-10-CM | POA: Insufficient documentation

## 2017-03-10 DIAGNOSIS — C73 Malignant neoplasm of thyroid gland: Secondary | ICD-10-CM | POA: Insufficient documentation

## 2017-03-10 DIAGNOSIS — C7800 Secondary malignant neoplasm of unspecified lung: Secondary | ICD-10-CM | POA: Insufficient documentation

## 2017-03-10 DIAGNOSIS — C770 Secondary and unspecified malignant neoplasm of lymph nodes of head, face and neck: Secondary | ICD-10-CM | POA: Insufficient documentation

## 2017-03-10 LAB — CBC WITH DIFFERENTIAL/PLATELET
Basophils Absolute: 0 10*3/uL (ref 0–0.1)
Basophils Relative: 1 %
Eosinophils Absolute: 0.6 10*3/uL (ref 0–0.7)
Eosinophils Relative: 7 %
HCT: 37.9 % (ref 35.0–47.0)
Hemoglobin: 13.1 g/dL (ref 12.0–16.0)
Lymphocytes Relative: 13 %
Lymphs Abs: 1 10*3/uL (ref 1.0–3.6)
MCH: 31.1 pg (ref 26.0–34.0)
MCHC: 34.5 g/dL (ref 32.0–36.0)
MCV: 90.4 fL (ref 80.0–100.0)
Monocytes Absolute: 0.9 10*3/uL (ref 0.2–0.9)
Monocytes Relative: 11 %
Neutro Abs: 5.5 10*3/uL (ref 1.4–6.5)
Neutrophils Relative %: 68 %
Platelets: 233 10*3/uL (ref 150–440)
RBC: 4.19 MIL/uL (ref 3.80–5.20)
RDW: 13.5 % (ref 11.5–14.5)
WBC: 8 10*3/uL (ref 3.6–11.0)

## 2017-03-10 LAB — COMPREHENSIVE METABOLIC PANEL
ALT: 15 U/L (ref 14–54)
AST: 32 U/L (ref 15–41)
Albumin: 3.8 g/dL (ref 3.5–5.0)
Alkaline Phosphatase: 123 U/L (ref 38–126)
Anion gap: 12 (ref 5–15)
BUN: 16 mg/dL (ref 6–20)
CO2: 26 mmol/L (ref 22–32)
Calcium: 8.5 mg/dL — ABNORMAL LOW (ref 8.9–10.3)
Chloride: 98 mmol/L — ABNORMAL LOW (ref 101–111)
Creatinine, Ser: 0.97 mg/dL (ref 0.44–1.00)
GFR calc Af Amer: >60 mL/min (ref >60)
GFR calc non Af Amer: 53 mL/min — ABNORMAL LOW (ref >60)
Glucose, Bld: 98 mg/dL (ref 65–99)
Potassium: 3.6 mmol/L (ref 3.5–5.1)
Sodium: 136 mmol/L (ref 135–145)
Total Bilirubin: 0.7 mg/dL (ref 0.3–1.2)
Total Protein: 6.9 g/dL (ref 6.5–8.1)

## 2017-03-10 LAB — T4, FREE: Free T4: 0.67 ng/dL (ref 0.61–1.12)

## 2017-03-10 LAB — TSH: TSH: 4.505 u[IU]/mL — ABNORMAL HIGH (ref 0.350–4.500)

## 2017-03-11 ENCOUNTER — Other Ambulatory Visit: Payer: Self-pay | Admitting: *Deleted

## 2017-03-11 DIAGNOSIS — C73 Malignant neoplasm of thyroid gland: Secondary | ICD-10-CM

## 2017-03-11 DIAGNOSIS — C7800 Secondary malignant neoplasm of unspecified lung: Secondary | ICD-10-CM

## 2017-04-04 ENCOUNTER — Telehealth: Payer: Self-pay | Admitting: *Deleted

## 2017-04-04 ENCOUNTER — Other Ambulatory Visit: Payer: Self-pay | Admitting: *Deleted

## 2017-04-04 DIAGNOSIS — C73 Malignant neoplasm of thyroid gland: Secondary | ICD-10-CM

## 2017-04-04 DIAGNOSIS — C7800 Secondary malignant neoplasm of unspecified lung: Secondary | ICD-10-CM

## 2017-04-04 NOTE — Telephone Encounter (Signed)
Returned patients call and informed her that the CT had been changed to contrast per Dr. Kem Parkinson order.voiced understanding.

## 2017-05-11 ENCOUNTER — Encounter: Payer: Medicare Other | Admitting: Obstetrics and Gynecology

## 2017-05-11 ENCOUNTER — Encounter: Payer: Self-pay | Admitting: Obstetrics and Gynecology

## 2017-05-11 VITALS — BP 92/56 | HR 78 | Ht 60.0 in | Wt 124.5 lb

## 2017-05-11 DIAGNOSIS — Z4689 Encounter for fitting and adjustment of other specified devices: Secondary | ICD-10-CM

## 2017-05-11 DIAGNOSIS — N390 Urinary tract infection, site not specified: Secondary | ICD-10-CM

## 2017-05-11 DIAGNOSIS — R309 Painful micturition, unspecified: Secondary | ICD-10-CM

## 2017-05-11 DIAGNOSIS — N8111 Cystocele, midline: Secondary | ICD-10-CM

## 2017-05-11 LAB — POCT URINALYSIS DIPSTICK
BILIRUBIN UA: NEGATIVE
Glucose, UA: NEGATIVE
KETONES UA: NEGATIVE
Nitrite, UA: NEGATIVE
PH UA: 7.5 (ref 5.0–8.0)
Urobilinogen, UA: 0.2 E.U./dL

## 2017-05-12 ENCOUNTER — Encounter: Payer: Self-pay | Admitting: Obstetrics and Gynecology

## 2017-05-12 ENCOUNTER — Ambulatory Visit (INDEPENDENT_AMBULATORY_CARE_PROVIDER_SITE_OTHER): Payer: Medicare Other | Admitting: Obstetrics and Gynecology

## 2017-05-12 VITALS — BP 92/56 | HR 78 | Ht 60.0 in | Wt 124.5 lb

## 2017-05-12 DIAGNOSIS — Z4689 Encounter for fitting and adjustment of other specified devices: Secondary | ICD-10-CM

## 2017-05-12 DIAGNOSIS — N814 Uterovaginal prolapse, unspecified: Secondary | ICD-10-CM | POA: Diagnosis not present

## 2017-05-12 DIAGNOSIS — N816 Rectocele: Secondary | ICD-10-CM

## 2017-05-12 DIAGNOSIS — N8111 Cystocele, midline: Secondary | ICD-10-CM | POA: Diagnosis not present

## 2017-05-12 NOTE — Progress Notes (Signed)
Pt rescheduled due to emergency C/S  Laura Mars, MD   This encounter was created in error - please disregard.

## 2017-05-12 NOTE — Progress Notes (Signed)
Chief complaint: 1. Pessary maintenance  2. Pelvic organ prolapse  8 week pessary check (last exam 11/30/2016)  Patient is using gel horn pessary for management of rectocele and uterine prolapse. She denies vaginal bleeding or discharge. She denies pelvic pain. She does have occasional UTI symptoms, along with history of recurrent UTIs. Just completed a course of Macrobid for UTI, prescribed by Dr. Doy Hutching   Gynecologic History No LMP recorded. Patient is postmenopausal. Contraception: post menopausal status Last Pap: n/a. Results were: n/a Last mammogram: 08/24/2016. Results were: normal-BI-RADS 1  OBJECTIVE: BP (!) 92/56   Pulse 78   Ht 5' (1.524 m)   Wt 124 lb 8 oz (56.5 kg)   BMI 24.31 kg/m  CONSTITUTIONAL: Well-developed, well-nourished female in no acute distress.  HENT: Normocephalic, atraumatic.  NECK: Not examined.  SKIN: Skin is warm and dry. No rash noted. Not diaphoretic. No erythema. No pallor. [place]: Alert and oriented to person, place, and time. PSYCHIATRIC: Normal mood and affect. Normal behavior. Slightly depressed. Normal judgment and thought content. CARDIOVASCULAR:Not Examined RESPIRATORY: Not Examined BREASTS: Not Examined ABDOMEN: Soft, non distended; Non tender. No Organomegaly. PELVIC: External Genitalia: Normal BUS: Normal Vagina: Mild hyperemia left side of vagina near cervix Cervix:  hyperemia at 11:00 and 7:00 on the cervix with slight ulceration Uterus: Normal size, shape,consistency, mobile,nontender Adnexa: Normal;nonpalpable and nontender RV: Normal external exam Bladder: Nontender MUSCULOSKELETAL: Normal range of motion. No tenderness. No cyanosis, clubbing, or edema.  PROCEDURE: Pessary is removed, cleaned, and not reinserted  ASSESSMENT: 1. Pessary maintenance 2. Cystocele 3. Rectocele 4. Cervical erosion  PLAN: 1.  Pessary is removed. 2. Leave pessary out for 2 weeks 3. Return in 2 weeks for reassessment and probable pessary insertion. Patient may return sooner if pelvic organ prolapse symptoms worsen.   Brayton Mars, MD

## 2017-05-12 NOTE — Patient Instructions (Signed)
1. Pessary is removed today. 2. Leave pessary out for 2 weeks. 3. Return in 2 weeks for follow-up and possible reinsertion of pessary 4. If symptoms develop sooner than 2 weeks, return on an as-needed basis.

## 2017-05-13 ENCOUNTER — Telehealth: Payer: Self-pay

## 2017-05-13 LAB — URINE CULTURE

## 2017-05-13 MED ORDER — NITROFURANTOIN MACROCRYSTAL 100 MG PO CAPS: 100.0000 mg | ORAL_CAPSULE | Freq: Two times a day (BID) | ORAL | 0 refills | Status: DC

## 2017-05-13 NOTE — Telephone Encounter (Signed)
-----   Message from Brayton Mars, MD sent at 05/13/2017  1:57 PM EDT ----- Please notify - Abnormal Labs E scribe Macrodantin 100 mg twice a day for 7 days

## 2017-05-13 NOTE — Telephone Encounter (Signed)
Pt aware. Med rx.

## 2017-05-26 ENCOUNTER — Ambulatory Visit (INDEPENDENT_AMBULATORY_CARE_PROVIDER_SITE_OTHER): Payer: Medicare Other | Admitting: Obstetrics and Gynecology

## 2017-05-26 ENCOUNTER — Encounter: Payer: Self-pay | Admitting: Obstetrics and Gynecology

## 2017-05-26 VITALS — BP 101/63 | HR 66 | Ht 60.0 in | Wt 121.9 lb

## 2017-05-26 DIAGNOSIS — N390 Urinary tract infection, site not specified: Secondary | ICD-10-CM | POA: Diagnosis not present

## 2017-05-26 DIAGNOSIS — N86 Erosion and ectropion of cervix uteri: Secondary | ICD-10-CM

## 2017-05-26 DIAGNOSIS — N814 Uterovaginal prolapse, unspecified: Secondary | ICD-10-CM

## 2017-05-26 DIAGNOSIS — N8111 Cystocele, midline: Secondary | ICD-10-CM | POA: Diagnosis not present

## 2017-05-26 DIAGNOSIS — N816 Rectocele: Secondary | ICD-10-CM | POA: Diagnosis not present

## 2017-05-26 DIAGNOSIS — Z4689 Encounter for fitting and adjustment of other specified devices: Secondary | ICD-10-CM

## 2017-05-26 LAB — POCT URINALYSIS DIPSTICK
Bilirubin, UA: NEGATIVE
Glucose, UA: NEGATIVE
KETONES UA: NEGATIVE
NITRITE UA: NEGATIVE
PROTEIN UA: NEGATIVE
RBC UA: NEGATIVE
Spec Grav, UA: <=1.005 — AB (ref 1.010–1.025)
Urobilinogen, UA: 0.2 E.U./dL
pH, UA: 7 (ref 5.0–8.0)

## 2017-05-26 NOTE — Patient Instructions (Addendum)
1. UA with CNS is obtained today 2 Return in 6 weeks for pessary maintenance 3. The gel horn pessary is inserted today.

## 2017-05-26 NOTE — Progress Notes (Signed)
Chief complaint: 1. Follow-up on cervical erosion 2. Pessary maintenance 3. Recurrent UTI  Patient presents for 2 week follow-up. She notes no vaginal bleeding, discharge,or odor.She has completed a course of antibiotics; no UTI symptoms are present however she just does not feel well.  Past medical history, past surgical history, problem list, medications, and allergies are reviewed  OBJECTIVE: BP 101/63   Pulse 66   Ht 5' (1.524 m)   Wt 121 lb 14.4 oz (55.3 kg)   BMI 23.81 kg/m  Pleasant elderly female in no acute distress Abdomen: Soft, nontender PELVIC: External Genitalia: Normal BUS: Normal Vagina: Mild hyperemia left side of vagina near cervix-RESOLVED Cervix:  hyperemia at 11:00 and 7:00 on the cervix with slight ulceration-healing, nonfriable Uterus: Normal size, shape,consistency, mobile,nontender Adnexa: Normal;nonpalpable and nontender RV: Normal external exam Bladder: Nontender  ASSESSMENT: 1. Cervical erosion, healing 2. History of recurrent UTI, status post antibiotic treatment 3. Pelvic organ prolapse, patient desiring insertion of pessary 4. Urinalysis and urine culture are obtained today  PLAN: 1. Gel horn pessary is inserted today 2. Trimosan gel intravaginal once a week is recommended 3. Return in 6 weeks for follow-up  A total of 15 minutes were spent face-to-face with the patient during this encounter and over half of that time dealt with counseling and coordination of care.    Brayton Mars, MD  Note: This dictation was prepared with Dragon dictation along with smaller phrase technology. Any transcriptional errors that result from this process are unintentional.

## 2017-05-30 LAB — URINE CULTURE

## 2017-05-31 ENCOUNTER — Other Ambulatory Visit
Admission: RE | Admit: 2017-05-31 | Discharge: 2017-05-31 | Disposition: A | Discharge status: Home / Self Care | Payer: Medicare Other | Source: Ambulatory Visit | Attending: Endocrinology | Admitting: Endocrinology

## 2017-05-31 DIAGNOSIS — E890 Postprocedural hypothyroidism: Secondary | ICD-10-CM | POA: Insufficient documentation

## 2017-05-31 LAB — TSH: TSH: 0.415 u[IU]/mL (ref 0.350–4.500)

## 2017-05-31 LAB — T4, FREE: Free T4: 0.85 ng/dL (ref 0.61–1.12)

## 2017-07-04 ENCOUNTER — Ambulatory Visit
Admission: RE | Admit: 2017-07-04 | Discharge: 2017-07-04 | Disposition: A | Discharge status: Home / Self Care | Payer: Medicare Other | Source: Ambulatory Visit | Attending: Hematology and Oncology | Admitting: Hematology and Oncology

## 2017-07-04 DIAGNOSIS — I2510 Atherosclerotic heart disease of native coronary artery without angina pectoris: Secondary | ICD-10-CM | POA: Insufficient documentation

## 2017-07-04 DIAGNOSIS — C73 Malignant neoplasm of thyroid gland: Secondary | ICD-10-CM | POA: Diagnosis not present

## 2017-07-04 DIAGNOSIS — C7800 Secondary malignant neoplasm of unspecified lung: Secondary | ICD-10-CM | POA: Insufficient documentation

## 2017-07-04 DIAGNOSIS — I700 Atherosclerosis of aorta: Secondary | ICD-10-CM | POA: Insufficient documentation

## 2017-07-04 LAB — POCT I-STAT CREATININE: Creatinine, Ser: 1.1 mg/dL — ABNORMAL HIGH (ref 0.44–1.00)

## 2017-07-04 MED ORDER — IOPAMIDOL (ISOVUE-300) INJECTION 61%
75.0000 mL | Freq: Once | INTRAVENOUS | Status: AC | PRN
  Administered 2017-07-04: 75 mL via INTRAVENOUS

## 2017-07-06 ENCOUNTER — Encounter: Payer: Self-pay | Admitting: Obstetrics and Gynecology

## 2017-07-06 ENCOUNTER — Ambulatory Visit (INDEPENDENT_AMBULATORY_CARE_PROVIDER_SITE_OTHER): Payer: Medicare Other | Admitting: Obstetrics and Gynecology

## 2017-07-06 ENCOUNTER — Other Ambulatory Visit: Payer: Self-pay | Admitting: *Deleted

## 2017-07-06 VITALS — BP 88/53 | HR 82 | Ht 60.0 in | Wt 122.9 lb

## 2017-07-06 DIAGNOSIS — R351 Nocturia: Secondary | ICD-10-CM | POA: Diagnosis not present

## 2017-07-06 DIAGNOSIS — N390 Urinary tract infection, site not specified: Secondary | ICD-10-CM

## 2017-07-06 DIAGNOSIS — C73 Malignant neoplasm of thyroid gland: Secondary | ICD-10-CM

## 2017-07-06 DIAGNOSIS — N814 Uterovaginal prolapse, unspecified: Secondary | ICD-10-CM | POA: Diagnosis not present

## 2017-07-06 DIAGNOSIS — Z4689 Encounter for fitting and adjustment of other specified devices: Secondary | ICD-10-CM | POA: Diagnosis not present

## 2017-07-06 DIAGNOSIS — N816 Rectocele: Secondary | ICD-10-CM

## 2017-07-06 DIAGNOSIS — N8111 Cystocele, midline: Secondary | ICD-10-CM | POA: Diagnosis not present

## 2017-07-06 MED ORDER — NITROFURANTOIN MONOHYD MACRO 100 MG PO CAPS: 100.0000 mg | ORAL_CAPSULE | Freq: Two times a day (BID) | ORAL | 0 refills | Status: DC

## 2017-07-06 NOTE — Patient Instructions (Signed)
1. The pessary is removed, cleaned, and reinserted. 2. Macrobid twice a day is prescribed for suspected UTI. 3. Urine culture is obtained. 4. Return in 8 weeks for pessary maintenance

## 2017-07-06 NOTE — Progress Notes (Signed)
Chief complaint: 1. Pessary maintenance 2. History of recurrent UTI  6 week pessary check-gel horn. Patient reports no significant vaginal bleeding, discharge, or odor. Patient has had difficulty obtaining urine sample today; urine was malodorous. Patient continues to use Trimosan gel intravaginally once a week.  Past medical history, past surgical history, problem list, medications, and allergies are reviewed  OBJECTIVE: BP (!) 88/53   Pulse 82   Ht 5' (1.524 m)   Wt 122 lb 14.4 oz (55.7 kg)   BMI 24.00 kg/m  Pleasant well-appearing elderly female in no acute stress. She is alert and oriented area Abdomen: Soft, nontender without organomegaly Pelvic exam: External genitalia-normal BUS-normal Vagina-minimal hyperemia noted on the left side of the vaginal sidewall near cervix without erosion Uterus-normal size shape and consistency, nontender Adnexa-nonpalpable nontender Rectovaginal-normal external exam Bladder-nontender  PROCEDURE: [place] pessary is removed, cleaned, and reinserted  ASSESSMENT: 1. Pessary management-routine 2. History of recurrent UTI with malodorous urine today, suspecting infection 3. Pelvic organ prolapse, stable with pessary use  PLAN: 1. Pessary is removed, cleaned, and reinserted 2. Trimosan gel was to be continued weekly intravaginal 3. Urine culture is obtained 4. Macrobid twice a day for 7 days is prescribed 5. Return in 8 weeks for pessary maintenance  A total of 15 minutes were spent face-to-face with the patient during this encounter and over half of that time dealt with counseling and coordination of care.  Brayton Mars, MD  Note: This dictation was prepared with Dragon dictation along with smaller phrase technology. Any transcriptional errors that result from this process are unintentional.

## 2017-07-07 ENCOUNTER — Inpatient Hospital Stay: Payer: Medicare Other | Attending: Hematology and Oncology

## 2017-07-07 ENCOUNTER — Other Ambulatory Visit: Payer: Self-pay | Admitting: *Deleted

## 2017-07-07 ENCOUNTER — Inpatient Hospital Stay (HOSPITAL_BASED_OUTPATIENT_CLINIC_OR_DEPARTMENT_OTHER): Payer: Medicare Other | Admitting: Hematology and Oncology

## 2017-07-07 VITALS — BP 98/67 | HR 81 | Temp 98.0°F | Resp 20 | Wt 122.6 lb

## 2017-07-07 DIAGNOSIS — M5136 Other intervertebral disc degeneration, lumbar region: Secondary | ICD-10-CM

## 2017-07-07 DIAGNOSIS — C73 Malignant neoplasm of thyroid gland: Secondary | ICD-10-CM

## 2017-07-07 DIAGNOSIS — C7800 Secondary malignant neoplasm of unspecified lung: Secondary | ICD-10-CM | POA: Insufficient documentation

## 2017-07-07 DIAGNOSIS — G8929 Other chronic pain: Secondary | ICD-10-CM

## 2017-07-07 DIAGNOSIS — C778 Secondary and unspecified malignant neoplasm of lymph nodes of multiple regions: Secondary | ICD-10-CM | POA: Diagnosis not present

## 2017-07-07 DIAGNOSIS — Z880 Allergy status to penicillin: Secondary | ICD-10-CM | POA: Diagnosis not present

## 2017-07-07 DIAGNOSIS — M1990 Unspecified osteoarthritis, unspecified site: Secondary | ICD-10-CM

## 2017-07-07 DIAGNOSIS — E039 Hypothyroidism, unspecified: Secondary | ICD-10-CM | POA: Insufficient documentation

## 2017-07-07 DIAGNOSIS — K219 Gastro-esophageal reflux disease without esophagitis: Secondary | ICD-10-CM | POA: Insufficient documentation

## 2017-07-07 DIAGNOSIS — R21 Rash and other nonspecific skin eruption: Secondary | ICD-10-CM | POA: Diagnosis not present

## 2017-07-07 DIAGNOSIS — Z79899 Other long term (current) drug therapy: Secondary | ICD-10-CM

## 2017-07-07 DIAGNOSIS — I10 Essential (primary) hypertension: Secondary | ICD-10-CM | POA: Insufficient documentation

## 2017-07-07 DIAGNOSIS — D497 Neoplasm of unspecified behavior of endocrine glands and other parts of nervous system: Secondary | ICD-10-CM

## 2017-07-07 DIAGNOSIS — Z923 Personal history of irradiation: Secondary | ICD-10-CM | POA: Diagnosis not present

## 2017-07-07 DIAGNOSIS — M549 Dorsalgia, unspecified: Secondary | ICD-10-CM | POA: Insufficient documentation

## 2017-07-07 DIAGNOSIS — B372 Candidiasis of skin and nail: Secondary | ICD-10-CM

## 2017-07-07 LAB — CBC WITH DIFFERENTIAL/PLATELET
Basophils Absolute: 0 10*3/uL (ref 0–0.1)
Basophils Relative: 1 %
Eosinophils Absolute: 0.8 10*3/uL — ABNORMAL HIGH (ref 0–0.7)
Eosinophils Relative: 12 %
HCT: 37.8 % (ref 35.0–47.0)
Hemoglobin: 12.8 g/dL (ref 12.0–16.0)
Lymphocytes Relative: 17 %
Lymphs Abs: 1.1 10*3/uL (ref 1.0–3.6)
MCH: 31.1 pg (ref 26.0–34.0)
MCHC: 33.8 g/dL (ref 32.0–36.0)
MCV: 92.1 fL (ref 80.0–100.0)
Monocytes Absolute: 0.7 10*3/uL (ref 0.2–0.9)
Monocytes Relative: 11 %
Neutro Abs: 3.9 10*3/uL (ref 1.4–6.5)
Neutrophils Relative %: 59 %
Platelets: 262 10*3/uL (ref 150–440)
RBC: 4.1 MIL/uL (ref 3.80–5.20)
RDW: 14.1 % (ref 11.5–14.5)
WBC: 6.4 10*3/uL (ref 3.6–11.0)

## 2017-07-07 LAB — COMPREHENSIVE METABOLIC PANEL
ALT: 14 U/L (ref 14–54)
AST: 33 U/L (ref 15–41)
Albumin: 4 g/dL (ref 3.5–5.0)
Alkaline Phosphatase: 107 U/L (ref 38–126)
Anion gap: 13 (ref 5–15)
BUN: 17 mg/dL (ref 6–20)
CO2: 28 mmol/L (ref 22–32)
Calcium: 8.4 mg/dL — ABNORMAL LOW (ref 8.9–10.3)
Chloride: 94 mmol/L — ABNORMAL LOW (ref 101–111)
Creatinine, Ser: 1.06 mg/dL — ABNORMAL HIGH (ref 0.44–1.00)
GFR calc Af Amer: 55 mL/min — ABNORMAL LOW (ref >60)
GFR calc non Af Amer: 48 mL/min — ABNORMAL LOW (ref >60)
Glucose, Bld: 195 mg/dL — ABNORMAL HIGH (ref 65–99)
Potassium: 3.5 mmol/L (ref 3.5–5.1)
Sodium: 135 mmol/L (ref 135–145)
Total Bilirubin: 0.6 mg/dL (ref 0.3–1.2)
Total Protein: 7.1 g/dL (ref 6.5–8.1)

## 2017-07-07 LAB — T4, FREE: Free T4: 1.16 ng/dL — ABNORMAL HIGH (ref 0.61–1.12)

## 2017-07-07 LAB — TSH: TSH: 0.158 u[IU]/mL — ABNORMAL LOW (ref 0.350–4.500)

## 2017-07-07 MED ORDER — NYSTATIN 100000 UNIT/GM EX POWD: Freq: Four times a day (QID) | CUTANEOUS | 0 refills | Status: DC

## 2017-07-07 NOTE — Progress Notes (Signed)
[facility] day:  07/07/17  Chief Complaint: Laura Walters is a 83 y.o. female with thyroid carcinoma and pituitary tumor who is seen for 6 month assessment.  HPI:  The patient was last seen in the medical oncology clinic on 01/03/2017.  At that time, she denied any respiratory symptoms.  She denied any neurologic symptoms.  Exam was stable.  PET scan revealed stable lung nodules and mediastinal lymph nodes.  Head MRI on 01/12/2017 revealed a 3 cm sellar and left cavernous sinus mass with clival invasion and suprasellar extension. This more likely represented recurrence of patient's pituitary tumor rather than a metastasis.  There was no evidence of intra-axial metastatic disease.  She was scheduled to follow-up with Dr. Derrill Memo at [facility].  She was seen by Dr. Derrill Memo on 01/18/2017.  Recommendation was to check her hormone levels to see if this was a hormone producing tumor. Formal visual fields were requested. Discussion was held regarding a transsphenoidal operation to be certain that this is the same sort of tumor it was before. He did not feel that it could be surgically resected given her age.  Recommendation was to take out some of the tissue and then refer her for radiation therapy.   Patient changed physicians back in July. Her daughter lives in [place], so she decided to change physicians based on her daughter's recommendation. She has been seen in consult, and subsequently treated, by Dr. Jorge Ny in [place], [name]. She notes that she received 5 cranial radiation treatments back in July.  Patient scheduled to follow up in March 2019, at which time she will have a repeat MRI. 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   Chest CT on 07/04/2017 revealed a mixed response to therapy, with similar number of numerous pulmonary nodules throughout the lungs bilaterally, but with some of these nodules demonstrating slight growth (2  mm) and yet other nodules demonstrating slight regression.  Symptomatically, patient is doing well. She notes chronic back pain. Patient denies any B symptoms. She is eating well, and her weight has been stable. Patient complaining of an intertriginous rash below her LEFT breast.  Patient continues on Levothyroxine 88 mcg daily s/p her thyroidectomy.    Past Medical History:  Diagnosis Date  . Arthritis   . Asthma   . Back pain   . Cancer of thyroid ([place]) 03/30/2015  . Cardiomyopathy ([place])    idiopathic  . Chest pain    non cardiac  . Chokes    easily   swallow test negative  . Constipation   . Cystocele   . DDD (degenerative disc disease), lumbar   . Disc disorder   . GERD (gastroesophageal reflux disease)   . H/O wheezing    advair as needed  . Hypertension   . Hypothyroidism   . IBS (irritable bowel syndrome)   . Low back pain   . Neuropathy   . Nocturia   . Pelvic pain in female   . RAD (reactive airway disease)   . Rectocele   . Seasonal allergies   . Spinal stenosis   . Urge incontinence   . Vaginal atrophy   . Vaginal polyp   . Vulvitis     Past Surgical History:  Procedure Laterality Date  . BREAST BIOPSY Right 86 and 92   2 bx-neg  . CATARACT EXTRACTION W/PHACO Right 05/19/2016   Procedure: CATARACT EXTRACTION PHACO AND INTRAOCULAR LENS PLACEMENT (IOC);  Surgeon: Remo Lipps  Dingeldein, MD;  Location: [facility] ORS;  Service: Ophthalmology;  Laterality: Right;  Lot# [id number] [place]:   1:25.3 AP%:  24.9% CDE:  40.11  . CATARACT EXTRACTION W/PHACO Left 08/11/2016   Procedure: CATARACT EXTRACTION PHACO AND INTRAOCULAR LENS PLACEMENT (IOC);  Surgeon: Estill Cotta, MD;  Location: [facility] ORS;  Service: Ophthalmology;  Laterality: Left;  [place] 1.24 AP% 26.1 CDE 37.79 FLUID PACK LOT # [id number]  . DILATION AND CURETTAGE OF UTERUS    . HEMORROIDECTOMY     x 2  . JOINT REPLACEMENT     left hip  . KNEE ARTHROPLASTY Right 01/05/2016   Procedure: COMPUTER ASSISTED TOTAL KNEE  ARTHROPLASTY;  Surgeon: Dereck Leep, MD;  Location: [facility] ORS;  Service: Orthopedics;  Laterality: Right;  . KNEE ARTHROSCOPY Right 05/19/2015   Procedure: Right knee arthrosocpy medail and lateral menisectomy, chondroplasty ;  Surgeon: Dereck Leep, MD;  Location: [facility] ORS;  Service: Orthopedics;  Laterality: Right;  . Left total hip arthroplasty    . PAROTIDECTOMY    . PITUITARY SURGERY    . THYROID SURGERY     bx  . THYROIDECTOMY      Family History  Problem Relation Age of Onset  . Heart disease Father   . Diabetes Paternal Uncle   . Cancer Neg Hx   . Breast cancer Neg Hx     Social History:  reports that she has never smoked. She has never used smokeless tobacco. She reports that she does not drink alcohol or use drugs.  The patient lives alone in [place]. She has a son named [name], and a daughter named [name].  The patient is alone today.  Allergies:  Allergies  Allergen Reactions  . Aleve [Naproxen]   . Codeine Hives and Itching  . Penicillins Hives    Has patient had a PCN reaction causing immediate rash, facial/tongue/throat swelling, SOB or lightheadedness with hypotension: hives Has patient had a PCN reaction causing severe rash involving mucus membranes or skin necrosis: no Has patient had a PCN reaction that required hospitalization no Has patient had a PCN reaction occurring within the last 10 years: no If all of the above answers are "NO", then may proceed with Cephalosporin use.  . Sulfa Antibiotics Hives    Current Medications: Current Outpatient Prescriptions  Medication Sig Dispense Refill  . carvedilol (COREG) 3.125 MG tablet Take 3.125 mg by mouth 2 (two) times daily with a meal.     . docusate sodium (COLACE) 100 MG capsule Take by mouth.    . DULoxetine (CYMBALTA) 60 MG capsule Take 60 mg by mouth daily.     . ferrous sulfate 325 (65 FE) MG tablet Take 325 mg by mouth 2 (two) times daily with a meal.     . fexofenadine-pseudoephedrine (ALLEGRA-D 24)  180-240 MG 24 hr tablet Take 1 tablet by mouth at bedtime. hs    . fluticasone (FLONASE) 50 MCG/ACT nasal spray Place 2 sprays into the nose daily as needed for allergies.     . Fluticasone-Salmeterol (ADVAIR DISKUS) 250-50 MCG/DOSE AEPB Inhale 1 puff into the lungs 2 (two) times daily.    . furosemide (LASIX) 20 MG tablet TAKE 1 TABLET BY MOUTH TWICE DAILY.    Marland Kitchen gabapentin (NEURONTIN) 400 MG capsule Take 800 mg by mouth 3 (three) times daily.     Marland Kitchen levothyroxine (SYNTHROID, LEVOTHROID) 100 MCG tablet Take 100 mcg by mouth daily before breakfast.    . MAGNESIUM PO Take by mouth.    Marland Kitchen  Multiple Vitamins-Minerals (PRESERVISION AREDS 2) CAPS Take 1 capsule by mouth 2 (two) times daily.    . nitrofurantoin (MACRODANTIN) 100 MG capsule Take 1 capsule (100 mg total) by mouth 2 (two) times daily. 14 capsule 0  . omeprazole (PRILOSEC) 40 MG capsule Take 40 mg by mouth daily.     . potassium chloride (K-DUR,KLOR-CON) 10 MEQ tablet Take 10 mEq by mouth daily.     . ranitidine (ZANTAC) 150 MG tablet Take 150 mg by mouth daily.    . traMADol (ULTRAM) 50 MG tablet Take 1-2 tablets (50-100 mg total) by mouth every 4 (four) hours as needed for moderate pain. (Patient not taking: Reported on 07/07/2017) 60 tablet 0   No current facility-administered medications for this visit.     Review of Systems:  GENERAL:  Feels "well".  No fevers or sweats.  Weight stable. PERFORMANCE STATUS (ECOG):  1 HEENT:  No visual changes, runny nose, sore throat, mouth sores or tenderness. Lungs: No shortness of breath or cough.  No hemoptysis. Cardiac:  No chest pain, palpitations, orthopnea, or PND. GI:  No nausea, vomiting, diarrhea, constipation, melena or hematochezia. GU:  No urgency, frequency, dysuria, or hematuria. Musculoskeletal:  Chronic back pain secondary to stenosis.  Arthritis.  No muscle tenderness. Extremities:  Intermittent swelling of right hand.  No pain. Skin:  Intertriginous rash under LEFT breast. No  rashes or skin changes. Neuro:  No headache, numbness or weakness, balance or coordination issues. Endocrine:  No diabetes.  On Synthroid s/p thyroidectomy for thyroid cancer (see HPI).  No hot flashes or night sweats. Psych:  No mood changes, depression or anxiety. Pain:  Back pain (chronic). Review of systems:  All other systems reviewed and found to be negative.  Physical Exam: Blood pressure 98/67, pulse 81, temperature 98 F (36.7 C), temperature source Tympanic, resp. rate 20, weight 122 lb 9 oz (55.6 kg). GENERAL:  Well developed, well nourished, elderly woman sitting comfortably in the exam room in no acute distress.  She has a cane at her side. MENTAL STATUS:  Alert and oriented to person, place and time. HEAD:  Curly white hair.  Normocephalic, atraumatic, face symmetric, no Cushingoid features. EYES:  Glasses.  Blue eyes.  Pupils equal round and reactive to light and accomodation.  No conjunctivitis or scleral icterus. ENT:  Oropharynx clear without lesion.  Tongue normal. Mucous membranes dry.  RESPIRATORY:  Clear to auscultation without rales, wheezes or rhonchi. CARDIOVASCULAR:  Regular rate and rhythm without murmur, rub or gallop. ABDOMEN:  Soft, non-tender, with active bowel sounds, and no hepatosplenomegaly.  No masses. SKIN:  Intertriginous rash under LEFT breast; erythematous and inflamed. No rashes, ulcers or lesions. EXTREMITIES: No edema, no skin discoloration or tenderness.  No palpable cords. LYMPH NODES: No palpable cervical, supraclavicular, axillary or inguinal adenopathy  NEUROLOGICAL: Unremarkable. PSYCH:  Appropriate.   Appointment on 07/07/2017  Component Date Value Ref Range Status  . WBC 07/07/2017 6.4  3.6 - 11.0 K/uL Final  . RBC 07/07/2017 4.10  3.80 - 5.20 MIL/uL Final  . Hemoglobin 07/07/2017 12.8  12.0 - 16.0 g/dL Final  . HCT 07/07/2017 37.8  35.0 - 47.0 % Final  . MCV 07/07/2017 92.1  80.0 - 100.0 fL Final  . MCH 07/07/2017 31.1  26.0 - 34.0  pg Final  . MCHC 07/07/2017 33.8  32.0 - 36.0 g/dL Final  . RDW 07/07/2017 14.1  11.5 - 14.5 % Final  . Platelets 07/07/2017 262  150 - 440 K/uL Final  .  Neutrophils Relative % 07/07/2017 59  % Final  . Neutro Abs 07/07/2017 3.9  1.4 - 6.5 K/uL Final  . Lymphocytes Relative 07/07/2017 17  % Final  . Lymphs Abs 07/07/2017 1.1  1.0 - 3.6 K/uL Final  . Monocytes Relative 07/07/2017 11  % Final  . Monocytes Absolute 07/07/2017 0.7  0.2 - 0.9 K/uL Final  . Eosinophils Relative 07/07/2017 12  % Final  . Eosinophils Absolute 07/07/2017 0.8* 0 - 0.7 K/uL Final  . Basophils Relative 07/07/2017 1  % Final  . Basophils Absolute 07/07/2017 0.0  0 - 0.1 K/uL Final  . Sodium 07/07/2017 135  135 - 145 mmol/L Final  . Potassium 07/07/2017 3.5  3.5 - 5.1 mmol/L Final  . Chloride 07/07/2017 94* 101 - 111 mmol/L Final  . CO2 07/07/2017 28  22 - 32 mmol/L Final  . Glucose, Bld 07/07/2017 195* 65 - 99 mg/dL Final  . BUN 07/07/2017 17  6 - 20 mg/dL Final  . Creatinine, Ser 07/07/2017 1.06* 0.44 - 1.00 mg/dL Final  . Calcium 07/07/2017 8.4* 8.9 - 10.3 mg/dL Final  . Total Protein 07/07/2017 7.1  6.5 - 8.1 g/dL Final  . Albumin 07/07/2017 4.0  3.5 - 5.0 g/dL Final  . AST 07/07/2017 33  15 - 41 U/L Final  . ALT 07/07/2017 14  14 - 54 U/L Final  . Alkaline Phosphatase 07/07/2017 107  38 - 126 U/L Final  . Total Bilirubin 07/07/2017 0.6  0.3 - 1.2 mg/dL Final  . GFR calc non Af Amer 07/07/2017 48* >60 mL/min Final  . GFR calc Af Amer 07/07/2017 55* >60 mL/min Final   Comment: (NOTE) The eGFR has been calculated using the CKD EPI equation. This calculation has not been validated in all clinical situations. eGFR's persistently <60 mL/min signify possible Chronic Kidney Disease.   . Anion gap 07/07/2017 13  5 - 15 Final    Assessment:  Laura Walters is a 83 y.o. female with a recurrent thyroid cancer.  She was diagnosed with thyroid carcinoma in 08/2002.  She underwent thyroidectomy and lymph node  dissection.  She received I-131 in 10/2002.    Her thyroid cancer recurred in 2012. CT scans in 01/2011 revealed lung nodules.  She received I-131 in 08/2011.  Follow-up scans revealed stable disease.  PET scan on 01/01/2016 revealed progressive disease, as evidenced by increased size and hypermetabolism of cervical nodes, thoracic nodes, and pulmonary nodules.  There was a foci of osseous hypermetabolism (indeterminate). Right posterior ninth rib and transverse process hypermetabolism could be posttraumatic, given suggestion of nondisplaced rib fracture.  I-131 scan on 02/16/2016 revealed no evidence of I-131 avid thyroid carcinoma.  Ultrasound guided biopsy of the left supraclavicular lymph node on 03/24/2016 revealed metastatic papillary thyroid carcinoma.  Dr Doy Hutching adjusts her Synthroid (current dose 75 mcg).  Chest CT on 07/02/2016 revealed stable metastatic disease to the lungs, with minimal enlargement of some pulmonary nodules, but no new pulmonary nodules. There was a new healing fracture of the anterior aspect of the left fifth rib.   PET scan on 12/31/2016 revealed generally similar appearance, with several abnormal hypermetabolic lymph nodes along the thoracic inlet level, and scattered hypermetabolic pulmonary nodules favoring the lung bases. The overall tumor burden has not significantly changed.  There was a 3.1 x 1.7 cm mass arising from the left side of the sella turcica intracranially which probably has some low-grade metabolic activity.   She received 5 cranial radiation treatments in 03/2017 in [place].  Chest CT on 07/04/2017 revealed a mixed response to therapy, with similar number of numerous pulmonary nodules throughout the lungs bilaterally, but with some of these nodules demonstrating slight growth (2 mm) and yet other nodules demonstrating slight regression.  Symptomatically, patient is doing well. She denies any B symptoms. Patient eating well, with no weight loss.  Patient has intertriginous rash under her LEFT breast.  She denies any neurologic symptoms.  Exam is stable. Labs unremarkable.   Plan: 1.  Labs today:  CBC with diff, CMP, TSH, T4, thyroglobulin. 2.  Discuss interval chest CT. 3.  Discuss mass in sells turcica and consult at [facility] then treatment in [place]. 4.  Obtain records from Dr. Stephani Police office.  5.  Rx: Nystatin powder to be used QID x 5-7 days, then PRN.  6.  Refer to dermatology (Dr. Phillip Heal) for recurrent rash below LEFT breast that likely represents  intertriginous candidiasis.  7.  Anticipate scheduling a PET scan in 12/2017. We will hold on scheduling PET until after records are reviewed from Dr. Rozetta Nunnery.  8.  RTC in 6 months for MD assessment, labs (CBC with diff, CMP, TSH, free T4).   Honor Loh, NP  07/07/2017, 3:24 PM   I saw and evaluated the patient, participating in the key portions of the service and reviewing pertinent diagnostic studies and records.  I reviewed the nurse practitioner's note and agree with the findings and the plan.  The assessment and plan were discussed with the patient.  Multiple questions were asked by the patient and answered.   Nolon Stalls, MD 07/07/2017,3:24 PM

## 2017-07-07 NOTE — Progress Notes (Signed)
Pt in for 6 month follow up.  Pt reports she had five radiation treatments to pituitary tumor in July. Pt also reports seeing GYN yesterday and started back on macrodantin for UTI which she routinely has issues with.  Denies any concerns or difficulties at this time.

## 2017-07-08 LAB — URINE CULTURE

## 2017-07-12 LAB — THYROGLOBULIN LEVEL: Thyroglobulin: <2 ng/mL

## 2017-07-31 ENCOUNTER — Encounter: Payer: Self-pay | Admitting: Hematology and Oncology

## 2017-08-03 ENCOUNTER — Other Ambulatory Visit
Admission: RE | Admit: 2017-08-03 | Discharge: 2017-08-03 | Disposition: A | Discharge status: Home / Self Care | Payer: Medicare Other | Source: Ambulatory Visit | Attending: Endocrinology | Admitting: Endocrinology

## 2017-08-03 DIAGNOSIS — E890 Postprocedural hypothyroidism: Secondary | ICD-10-CM | POA: Insufficient documentation

## 2017-08-03 DIAGNOSIS — D352 Benign neoplasm of pituitary gland: Secondary | ICD-10-CM | POA: Insufficient documentation

## 2017-08-03 LAB — TSH: TSH: 0.09 u[IU]/mL — AB (ref 0.350–4.500)

## 2017-08-03 LAB — CORTISOL: CORTISOL PLASMA: 10.9 ug/dL

## 2017-08-03 LAB — T4, FREE: Free T4: 1.14 ng/dL — ABNORMAL HIGH (ref 0.61–1.12)

## 2017-08-04 LAB — FOLLICLE STIMULATING HORMONE: FSH: 40.8 m[IU]/mL

## 2017-08-04 LAB — ACTH: C206 ACTH: 27 pg/mL (ref 7.2–63.3)

## 2017-09-07 ENCOUNTER — Encounter: Payer: Self-pay | Admitting: Obstetrics and Gynecology

## 2017-09-07 ENCOUNTER — Ambulatory Visit (INDEPENDENT_AMBULATORY_CARE_PROVIDER_SITE_OTHER): Payer: Medicare Other | Admitting: Obstetrics and Gynecology

## 2017-09-07 VITALS — BP 92/60 | HR 85 | Ht 60.0 in | Wt 121.0 lb

## 2017-09-07 DIAGNOSIS — N390 Urinary tract infection, site not specified: Secondary | ICD-10-CM | POA: Diagnosis not present

## 2017-09-07 DIAGNOSIS — N814 Uterovaginal prolapse, unspecified: Secondary | ICD-10-CM | POA: Diagnosis not present

## 2017-09-07 DIAGNOSIS — Z4689 Encounter for fitting and adjustment of other specified devices: Secondary | ICD-10-CM

## 2017-09-07 DIAGNOSIS — N86 Erosion and ectropion of cervix uteri: Secondary | ICD-10-CM | POA: Diagnosis not present

## 2017-09-07 NOTE — Patient Instructions (Signed)
1.  Return in 8 weeks for pessary maintenance 2.  Continue with Trimosan gel intravaginally weekly

## 2017-09-07 NOTE — Progress Notes (Signed)
Chief complaint: 1.  Pessary maintenance 2.  History of recurrent UTI  8-week pessary check-gel horn (last visit 07/06/2017) Patient reports no significant vaginal bleeding, vaginal discharge, or odor. Trimosan gel is being used intravaginally once a week. Patient denies UTI symptoms.  Past medical history, past surgical history, problem list, medications, and allergies are reviewed  OBJECTIVE: BP 92/60   Pulse 85   Ht 5' (1.524 m)   Wt 121 lb (54.9 kg)   BMI 23.63 kg/m  Pleasant elderly female in no acute distress.  She is alert and oriented. Abdomen: Soft, nontender without organomegaly Pelvic exam: External genitalia-normal BUS-normal Vagina-no significant discharge.  No lesions. Cervix-4 x 7 mm superficial erosion is noted on cervix at 10:00, nonbleeding Uterus-not palpated Adnexa-not palpated Rectovaginal-normal external exam  PROCEDURE: Gel horn pessary is removed, cleaned, and reinserted  ASSESSMENT: 1.  Pessary maintenance 2.  History of recurrent UTI without symptoms today 3.  Pelvic organ prolapse, stable with pessary use 4.  Small cervical erosion at 10:00, non-bleeding  PLAN: 1.  Pessary is removed, cleaned, and reinserted 2.  Trimosan gel is to be continued weekly intravaginally 3.  Return in 8 weeks for pessary maintenance or sooner if vaginal discharge, vaginal bleeding, or vaginal odor develops.  A total of 15 minutes were spent face-to-face with the patient during this encounter and over half of that time dealt with counseling and coordination of care.  Brayton Mars, MD  Note: This dictation was prepared with Dragon dictation along with smaller phrase technology. Any transcriptional errors that result from this process are unintentional.

## 2017-10-18 ENCOUNTER — Other Ambulatory Visit: Payer: Self-pay | Admitting: Internal Medicine

## 2017-10-18 DIAGNOSIS — Z1231 Encounter for screening mammogram for malignant neoplasm of breast: Secondary | ICD-10-CM

## 2017-11-02 ENCOUNTER — Ambulatory Visit (INDEPENDENT_AMBULATORY_CARE_PROVIDER_SITE_OTHER): Payer: Medicare Other | Admitting: Obstetrics and Gynecology

## 2017-11-02 ENCOUNTER — Encounter: Payer: Self-pay | Admitting: Obstetrics and Gynecology

## 2017-11-02 VITALS — BP 95/58 | HR 72 | Ht 60.0 in | Wt 122.7 lb

## 2017-11-02 DIAGNOSIS — N814 Uterovaginal prolapse, unspecified: Secondary | ICD-10-CM | POA: Diagnosis not present

## 2017-11-02 DIAGNOSIS — N816 Rectocele: Secondary | ICD-10-CM | POA: Diagnosis not present

## 2017-11-02 DIAGNOSIS — N8111 Cystocele, midline: Secondary | ICD-10-CM

## 2017-11-02 NOTE — Progress Notes (Signed)
Chief complaint: 1.  Pessary maintenance 2.  History of recurrent UTI  8-week pessary check-gel horn  quotes last visit (09/07/2017) Patient reports no significant vaginal bleeding, vaginal discharge, or odor. Trimosan gel is being used intravaginally once a week. Patient denies UTI symptoms. Problem with sinuses; going to see Dr. Doy Hutching tomorrow  Past medical history, past surgical history, problem list, medications, and allergies are reviewed  OBJECTIVE: BP (!) 95/58   Pulse 72   Ht 5' (1.524 m)   Wt 122 lb 11.2 oz (55.7 kg)   BMI 23.96 kg/m  Pleasant elderly female in no acute distress.  She is alert and oriented. Abdomen: Soft, nontender without organomegaly Pelvic exam: External genitalia-normal BUS-normal Vagina-no significant discharge.  No lesions.  No palpable abnormality or tenderness Cervix-4 x 7 mm superficial erosion is noted on cervix at 10:00, nonbleeding (unchanged from last visit) Uterus-midplane, small, nontender Adnexa-nonpalpable and nontender Rectovaginal-normal external exam  PROCEDURE: Gel horn pessary is removed, cleaned, and reinserted  ASSESSMENT: 1.  Pessary maintenance 2.  History of recurrent UTI without symptoms today 3.  Pelvic organ prolapse, stable with pessary use 4.  Small cervical erosion at 10:00, non-bleeding-stable  PLAN: 1.  Pessary is removed, cleaned, and reinserted 2.  Trimosan gel is to be continued weekly intravaginally 3.  Return in 8 weeks for pessary maintenance or sooner if vaginal discharge, vaginal bleeding, or vaginal odor develops.  A total of 15 minutes were spent face-to-face with the patient during this encounter and over half of that time dealt with counseling and coordination of care.

## 2017-11-02 NOTE — Patient Instructions (Signed)
1.  Return in 8 weeks for pessary maintenance

## 2017-11-04 ENCOUNTER — Ambulatory Visit
Admission: RE | Admit: 2017-11-04 | Discharge: 2017-11-04 | Disposition: A | Discharge status: Home / Self Care | Payer: Medicare Other | Source: Ambulatory Visit | Attending: Internal Medicine | Admitting: Internal Medicine

## 2017-11-04 DIAGNOSIS — Z1231 Encounter for screening mammogram for malignant neoplasm of breast: Secondary | ICD-10-CM | POA: Insufficient documentation

## 2017-11-29 ENCOUNTER — Other Ambulatory Visit
Admission: RE | Admit: 2017-11-29 | Discharge: 2017-11-29 | Disposition: A | Discharge status: Home / Self Care | Payer: Medicare Other | Source: Ambulatory Visit | Attending: Ophthalmology | Admitting: Ophthalmology

## 2017-11-29 DIAGNOSIS — H02402 Unspecified ptosis of left eyelid: Secondary | ICD-10-CM | POA: Insufficient documentation

## 2017-12-08 LAB — MISC LABCORP TEST (SEND OUT): Labcorp test code: 85933

## 2017-12-12 ENCOUNTER — Other Ambulatory Visit
Admission: RE | Admit: 2017-12-12 | Discharge: 2017-12-12 | Disposition: A | Discharge status: Home / Self Care | Payer: Medicare Other | Source: Ambulatory Visit | Attending: Endocrinology | Admitting: Endocrinology

## 2017-12-12 DIAGNOSIS — C73 Malignant neoplasm of thyroid gland: Secondary | ICD-10-CM | POA: Diagnosis present

## 2017-12-12 LAB — CORTISOL: Cortisol, Plasma: 11.6 ug/dL

## 2017-12-12 LAB — TSH: TSH: 0.05 u[IU]/mL — ABNORMAL LOW (ref 0.350–4.500)

## 2017-12-12 LAB — T4, FREE: Free T4: 1.21 ng/dL — ABNORMAL HIGH (ref 0.61–1.12)

## 2017-12-13 LAB — FOLLICLE STIMULATING HORMONE: FSH: 37.8 m[IU]/mL

## 2017-12-28 ENCOUNTER — Emergency Department
Admission: EM | Admit: 2017-12-28 | Discharge: 2017-12-28 | Disposition: A | Discharge status: Home / Self Care | Payer: Medicare Other | Attending: Student in an Organized Health Care Education/Training Program | Admitting: Student in an Organized Health Care Education/Training Program

## 2017-12-28 ENCOUNTER — Emergency Department: Payer: Medicare Other

## 2017-12-28 ENCOUNTER — Encounter: Payer: Self-pay | Admitting: Obstetrics and Gynecology

## 2017-12-28 ENCOUNTER — Ambulatory Visit (INDEPENDENT_AMBULATORY_CARE_PROVIDER_SITE_OTHER): Payer: Medicare Other | Admitting: Obstetrics and Gynecology

## 2017-12-28 VITALS — BP 90/57 | HR 79 | Ht 60.0 in | Wt 121.0 lb

## 2017-12-28 DIAGNOSIS — Z4689 Encounter for fitting and adjustment of other specified devices: Secondary | ICD-10-CM

## 2017-12-28 DIAGNOSIS — Z96642 Presence of left artificial hip joint: Secondary | ICD-10-CM | POA: Diagnosis not present

## 2017-12-28 DIAGNOSIS — Z96651 Presence of right artificial knee joint: Secondary | ICD-10-CM | POA: Diagnosis not present

## 2017-12-28 DIAGNOSIS — N814 Uterovaginal prolapse, unspecified: Secondary | ICD-10-CM

## 2017-12-28 DIAGNOSIS — I10 Essential (primary) hypertension: Secondary | ICD-10-CM | POA: Diagnosis not present

## 2017-12-28 DIAGNOSIS — R55 Syncope and collapse: Secondary | ICD-10-CM | POA: Diagnosis present

## 2017-12-28 DIAGNOSIS — N8111 Cystocele, midline: Secondary | ICD-10-CM

## 2017-12-28 DIAGNOSIS — Z85850 Personal history of malignant neoplasm of thyroid: Secondary | ICD-10-CM | POA: Insufficient documentation

## 2017-12-28 DIAGNOSIS — Z79899 Other long term (current) drug therapy: Secondary | ICD-10-CM | POA: Insufficient documentation

## 2017-12-28 DIAGNOSIS — J45909 Unspecified asthma, uncomplicated: Secondary | ICD-10-CM | POA: Diagnosis not present

## 2017-12-28 DIAGNOSIS — N816 Rectocele: Secondary | ICD-10-CM

## 2017-12-28 LAB — BASIC METABOLIC PANEL
Anion gap: 11 (ref 5–15)
BUN: 17 mg/dL (ref 6–20)
CALCIUM: 7.6 mg/dL — AB (ref 8.9–10.3)
CO2: 26 mmol/L (ref 22–32)
Chloride: 103 mmol/L (ref 101–111)
Creatinine, Ser: 1.04 mg/dL — ABNORMAL HIGH (ref 0.44–1.00)
GFR calc Af Amer: 56 mL/min — ABNORMAL LOW (ref >60)
GFR, EST NON AFRICAN AMERICAN: 49 mL/min — AB (ref >60)
Glucose, Bld: 140 mg/dL — ABNORMAL HIGH (ref 65–99)
Potassium: 3.8 mmol/L (ref 3.5–5.1)
Sodium: 140 mmol/L (ref 135–145)

## 2017-12-28 LAB — URINALYSIS, COMPLETE (UACMP) WITH MICROSCOPIC
Bilirubin Urine: NEGATIVE
GLUCOSE, UA: NEGATIVE mg/dL
HGB URINE DIPSTICK: NEGATIVE
Ketones, ur: NEGATIVE mg/dL
NITRITE: NEGATIVE
PH: 7 (ref 5.0–8.0)
Protein, ur: NEGATIVE mg/dL
SPECIFIC GRAVITY, URINE: 1.006 (ref 1.005–1.030)

## 2017-12-28 LAB — CBC
HCT: 37.1 % (ref 35.0–47.0)
HEMOGLOBIN: 12.3 g/dL (ref 12.0–16.0)
MCH: 30.4 pg (ref 26.0–34.0)
MCHC: 33.2 g/dL (ref 32.0–36.0)
MCV: 91.7 fL (ref 80.0–100.0)
Platelets: 212 10*3/uL (ref 150–440)
RBC: 4.05 MIL/uL (ref 3.80–5.20)
RDW: 13.6 % (ref 11.5–14.5)
WBC: 7.8 10*3/uL (ref 3.6–11.0)

## 2017-12-28 LAB — TROPONIN I
Troponin I: <0.03 ng/mL (ref <0.03)
Troponin I: <0.03 ng/mL (ref <0.03)

## 2017-12-28 MED ORDER — SODIUM CHLORIDE 0.9 % IV BOLUS
500.0000 mL | Freq: Once | INTRAVENOUS | Status: AC
  Administered 2017-12-28: 500 mL via INTRAVENOUS

## 2017-12-28 NOTE — Patient Instructions (Addendum)
1.  Return in 8 weeks for pessary maintenance 2.  Continue using Trimosan gel intravaginal weekly

## 2017-12-28 NOTE — ED Notes (Signed)
Pt discharged to home.  Family member driving.  Discharge instructions reviewed.  Verbalized understanding.  No questions or concerns at this time.  Teach back verified.  Pt in NAD.  No items left in ED.   

## 2017-12-28 NOTE — ED Triage Notes (Signed)
[facility].  Per EMS, pt had syncopal episode and was found to have bp of 60/40.  With 250 cc bolus of fluid pt's bp came up to 110/60.  Pt alert upon arrival and ambulated to toilet to have a BM.  Pt states that her L arm is hurting at this time.

## 2017-12-28 NOTE — Progress Notes (Signed)
Chief complaint: 1. Pessary maintenance 2. History of recurrent UTI  8-week pessary check-gel horn-last visit (11/02/2017) Patient reports no significant vaginal bleeding, vaginal discharge, or odor. Trimosan gel is being used intravaginally once a week. Patient denies UTI symptoms. Patient has brain MRI scheduled for next week.  No specific neurologic complaints at this time.  Past medical history, past surgical history, problem list, medications, and allergies are reviewed  OBJECTIVE: BP (!) 90/57   Pulse 79   Ht 5' (1.524 m)   Wt 121 lb (54.9 kg)   BMI 23.63 kg/m  Pleasant elderly female in no acute distress. She is alert and oriented. Abdomen: Soft, nontender without organomegaly Pelvic exam: Unchanged External genitalia-normal BUS-normal Vagina-no significant discharge. No lesions.  No palpable abnormality or tenderness Cervix-4 x 7 mm superficial erosion is noted on cervix at 10:00, nonbleeding (unchanged from last visit) Uterus-midplane, small, nontender Adnexa-nonpalpable and nontender Rectovaginal-normal external exam  PROCEDURE: Gel horn pessary is removed, cleaned, and reinserted  ASSESSMENT: 1. Pessary maintenance 2. History of recurrent UTI without symptoms today 3. Pelvic organ prolapse, stable with pessary use 4. Small cervical erosion at 10:00, non-bleeding-stable  PLAN: 1. Pessary is removed, cleaned, and reinserted 2. Trimosan gel is to be continued weekly intravaginally 3. Return in 8 weeks for pessary maintenance or sooner if vaginal discharge, vaginal bleeding, or vaginal odor develops.  A total of 15 minutes were spent face-to-face with the patient during this encounter and over half of that time dealt with counseling and coordination of care.  Brayton Mars, MD  Note: This dictation was prepared with Dragon dictation along with smaller phrase technology. Any transcriptional errors that result from this process are  unintentional.

## 2017-12-28 NOTE — ED Provider Notes (Signed)
[facility] Emergency Department Provider Note    First MD Initiated Contact with Patient 12/28/17 1413     (approximate)  I have reviewed the triage vital signs and the nursing notes.   HISTORY  Chief Complaint Loss of Consciousness    HPI Laura Walters is a 84 y.o. female history of pituitary adenoma as well as thyroid cancer as well as cardiomyopathy idiopathic presents from hair salon after she had a syncopal event that was witnessed.  No shaking spells.  Patient denies any symptoms at this time.  Does have some mild left elbow pain she feels is similar to previous episodes of arthritis.  Denies any diaphoresis.  No chest pain or shortness of breath prior to syncope.  Brief in nature.  EMS found the patient to be hypotensive.  She was given a bolus of IV fluids with improvement.  States that she feels better.  Denies any blood in her stools.  No diarrhea or dysuria.  Past Medical History:  Diagnosis Date  . Arthritis   . Asthma   . Back pain   . Cancer of thyroid ([place]) 03/30/2015  . Cardiomyopathy ([place])    idiopathic  . Chest pain    non cardiac  . Chokes    easily   swallow test negative  . Constipation   . Cystocele   . DDD (degenerative disc disease), lumbar   . Disc disorder   . GERD (gastroesophageal reflux disease)   . H/O wheezing    advair as needed  . Hypertension   . Hypothyroidism   . IBS (irritable bowel syndrome)   . Low back pain   . Neuropathy   . Nocturia   . Pelvic pain in female   . RAD (reactive airway disease)   . Rectocele   . Seasonal allergies   . Spinal stenosis   . Urge incontinence   . Vaginal atrophy   . Vaginal polyp   . Vulvitis    Family History  Problem Relation Age of Onset  . Heart disease Father   . Diabetes Paternal Uncle   . Cancer Neg Hx   . Breast cancer Neg Hx    Past Surgical History:  Procedure Laterality Date  . BREAST BIOPSY Right 88 and 89   2 bx-neg  . CATARACT EXTRACTION W/PHACO  Right 05/19/2016   Procedure: CATARACT EXTRACTION PHACO AND INTRAOCULAR LENS PLACEMENT (IOC);  Surgeon: Estill Cotta, MD;  Location: [facility] ORS;  Service: Ophthalmology;  Laterality: Right;  Lot# [id number] [place]:   1:25.3 AP%:  24.9% CDE:  40.11  . CATARACT EXTRACTION W/PHACO Left 08/11/2016   Procedure: CATARACT EXTRACTION PHACO AND INTRAOCULAR LENS PLACEMENT (IOC);  Surgeon: Estill Cotta, MD;  Location: [facility] ORS;  Service: Ophthalmology;  Laterality: Left;  [place] 1.24 AP% 26.1 CDE 37.79 FLUID PACK LOT # [id number]  . DILATION AND CURETTAGE OF UTERUS    . HEMORROIDECTOMY     x 2  . JOINT REPLACEMENT     left hip  . KNEE ARTHROPLASTY Right 01/05/2016   Procedure: COMPUTER ASSISTED TOTAL KNEE ARTHROPLASTY;  Surgeon: Dereck Leep, MD;  Location: [facility] ORS;  Service: Orthopedics;  Laterality: Right;  . KNEE ARTHROSCOPY Right 05/19/2015   Procedure: Right knee arthrosocpy medail and lateral menisectomy, chondroplasty ;  Surgeon: Dereck Leep, MD;  Location: [facility] ORS;  Service: Orthopedics;  Laterality: Right;  . Left total hip arthroplasty    . PAROTIDECTOMY    . PITUITARY SURGERY    .  THYROID SURGERY     bx  . THYROIDECTOMY     Patient Active Problem List   Diagnosis Date Noted  . Uterine prolapse 02/03/2017  . Pulmonary metastases ([place]) 04/09/2016  . RAD (reactive airway disease) 02/27/2016  . Recurrent sinus infections 01/31/2016  . H/O total knee replacement 01/20/2016  . S/P total knee arthroplasty 01/05/2016  . 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 Arthropathy, traumatic, knee 12/16/2015  . Cystocele, midline 12/02/2015  . Rectocele 12/02/2015  . Frequent UTI 11/26/2015  . Acquired hypothyroidism 10/13/2015  . Pituitary tumor 10/13/2015  . Degeneration of intervertebral disc of lumbar region 08/01/2015  . Lumbar canal stenosis 08/01/2015  . Neuritis or radiculitis due to rupture of lumbar intervertebral disc 08/01/2015  . LBP (low back pain)  03/30/2015  . Neuropathy ([place]) 03/30/2015  . Chest pain, non-cardiac 03/30/2015  . Neoplasm of pituitary gland 03/30/2015  . Cancer of thyroid ([place]) 03/30/2015  . Combined fat and carbohydrate induced hyperlipemia 10/17/2014  . Primary cardiomyopathy ([place]) 10/14/2014      Prior to Admission medications   Medication Sig Start Date End Date Taking? Authorizing Provider  carvedilol (COREG) 3.125 MG tablet Take 3.125 mg by mouth 2 (two) times daily with a meal.  01/26/16   [provider]  diphenhydrAMINE (BENADRYL) 25 MG tablet Take 25 mg by mouth every 6 (six) hours as needed.    [provider]  docusate sodium (COLACE) 100 MG capsule Take by mouth.    [provider]  DULoxetine (CYMBALTA) 60 MG capsule Take 60 mg by mouth daily.  02/02/16   [provider]  ferrous sulfate 325 (65 FE) MG tablet Take 325 mg by mouth 2 (two) times daily with a meal.     [provider]  fexofenadine-pseudoephedrine (ALLEGRA-D 24) 180-240 MG 24 hr tablet Take 1 tablet by mouth at bedtime. hs 03/05/10   [provider]  fluticasone (FLONASE) 50 MCG/ACT nasal spray Place 2 sprays into the nose daily as needed for allergies.  06/18/15   [provider]  Fluticasone-Salmeterol (ADVAIR DISKUS) 250-50 MCG/DOSE AEPB Inhale 1 puff into the lungs 2 (two) times daily.    [provider]  furosemide (LASIX) 20 MG tablet TAKE 1 TABLET BY MOUTH TWICE DAILY. 11/03/15   [provider]  gabapentin (NEURONTIN) 400 MG capsule Take 800 mg by mouth 3 (three) times daily.  02/09/16   [provider]  levothyroxine (SYNTHROID, LEVOTHROID) 88 MCG tablet  10/04/17   [provider]  MAGNESIUM PO Take by mouth.    [provider]  Multiple Vitamins-Minerals (PRESERVISION AREDS 2) CAPS Take 1 capsule by mouth 2 (two) times daily.    [provider]  nystatin (MYCOSTATIN/NYSTOP) powder Apply topically 4 (four) times daily. 07/07/17    Karen Kitchens, NP  omeprazole (PRILOSEC) 40 MG capsule Take 40 mg by mouth daily.  02/16/16   [provider]  potassium chloride (K-DUR,KLOR-CON) 10 MEQ tablet Take 10 mEq by mouth daily.     [provider]  ranitidine (ZANTAC) 150 MG tablet Take 150 mg by mouth daily. 12/09/16   [provider]    Allergies Aleve [naproxen]; Codeine; Penicillins; and Sulfa antibiotics    Social History Social History   Tobacco Use  . Smoking status: Never Smoker  . Smokeless tobacco: Never Used  Substance Use Topics  . Alcohol use: No  . Drug use: No    Review of Systems Patient denies headaches,  rhinorrhea, blurry vision, numbness, shortness of breath, chest pain, edema, cough, abdominal pain, nausea, vomiting, diarrhea, dysuria, fevers, rashes or hallucinations unless otherwise stated above in HPI. ____________________________________________   PHYSICAL EXAM:  VITAL SIGNS: Vitals:   12/28/17 1830 12/28/17 1900  BP: (!) 140/92 (!) 146/77  Pulse: 69 69  Resp: 14 13  Temp:    SpO2: 100% (!) 85%    Constitutional: Alert and oriented. Well appearing and in no acute distress. Eyes: Conjunctivae are normal.  Head: Atraumatic. Nose: No congestion/rhinnorhea. Mouth/Throat: Mucous membranes are moist.   Neck: No stridor. Painless ROM.  Cardiovascular: Normal rate, regular rhythm. Grossly normal heart sounds.  Good peripheral circulation. Respiratory: Normal respiratory effort.  No retractions. Lungs CTAB. Gastrointestinal: Soft and nontender. No distention. No abdominal bruits. No CVA tenderness. Genitourinary: deferred Musculoskeletal: No lower extremity tenderness nor edema.  No joint effusions. Neurologic:  Normal speech and language. No gross focal neurologic deficits are appreciated. No facial droop Skin:  Skin is warm, dry and intact. No rash noted. Psychiatric: Mood and affect are normal. Speech and behavior are  normal.  ____________________________________________   LABS (all labs ordered are listed, but only abnormal results are displayed)  Results for orders placed or performed during the hospital encounter of 12/28/17 (from the past 24 hour(s))  Basic metabolic panel     Status: Abnormal   Collection Time: 12/28/17  2:36 PM  Result Value Ref Range   Sodium 140 135 - 145 mmol/L   Potassium 3.8 3.5 - 5.1 mmol/L   Chloride 103 101 - 111 mmol/L   CO2 26 22 - 32 mmol/L   Glucose, Bld 140 (H) 65 - 99 mg/dL   BUN 17 6 - 20 mg/dL   Creatinine, Ser 1.04 (H) 0.44 - 1.00 mg/dL   Calcium 7.6 (L) 8.9 - 10.3 mg/dL   GFR calc non Af Amer 49 (L) >60 mL/min   GFR calc Af Amer 56 (L) >60 mL/min   Anion gap 11 5 - 15  CBC     Status: None   Collection Time: 12/28/17  2:36 PM  Result Value Ref Range   WBC 7.8 3.6 - 11.0 K/uL   RBC 4.05 3.80 - 5.20 MIL/uL   Hemoglobin 12.3 12.0 - 16.0 g/dL   HCT 37.1 35.0 - 47.0 %   MCV 91.7 80.0 - 100.0 fL   MCH 30.4 26.0 - 34.0 pg   MCHC 33.2 32.0 - 36.0 g/dL   RDW 13.6 11.5 - 14.5 %   Platelets 212 150 - 440 K/uL  Troponin I     Status: None   Collection Time: 12/28/17  2:44 PM  Result Value Ref Range   Troponin I <0.03 <0.03 ng/mL  Urinalysis, Complete w Microscopic     Status: Abnormal   Collection Time: 12/28/17  4:14 PM  Result Value Ref Range   Color, Urine YELLOW (A) YELLOW   APPearance HAZY (A) CLEAR   Specific Gravity, Urine 1.006 1.005 - 1.030   pH 7.0 5.0 - 8.0   Glucose, UA NEGATIVE NEGATIVE mg/dL   Hgb urine dipstick NEGATIVE NEGATIVE   Bilirubin Urine NEGATIVE NEGATIVE   Ketones, ur NEGATIVE NEGATIVE mg/dL   Protein, ur NEGATIVE NEGATIVE mg/dL   Nitrite NEGATIVE NEGATIVE   Leukocytes, UA MODERATE (A) NEGATIVE   RBC / HPF 0-5 0 - 5 RBC/hpf   WBC, UA 6-30 0 - 5 WBC/hpf   Bacteria, UA RARE (A) NONE SEEN   Squamous Epithelial / LPF 0-5 (A) NONE  SEEN   Hyaline Casts, UA PRESENT   Troponin I     Status: None   Collection Time: 12/28/17   5:48 PM  Result Value Ref Range   Troponin I <0.03 <0.03 ng/mL   ____________________________________________  EKG My review and personal interpretation at Time: 14:29   Indication: syncope  Rate: 60  Rhythm: sinus  Axis: normal Other:  Borderline prolonged qt otherwise, Normal intervals, no stemi ____________________________________________  RADIOLOGY  I personally reviewed all radiographic images ordered to evaluate for the above acute complaints and reviewed radiology reports and findings.  These findings were personally discussed with the patient.  Please see medical record for radiology report.  ____________________________________________   PROCEDURES  Procedure(s) performed:  Procedures    Critical Care performed: no ____________________________________________   INITIAL IMPRESSION / ASSESSMENT AND PLAN / ED COURSE  Pertinent labs & imaging results that were available during my care of the patient were reviewed by me and considered in my medical decision making (see chart for details).  DDX: dehydration, orthostasis, vasovagl, dysrhythmia, acs, anemia  DEIONA HOOPER is a 84 y.o. who presents to the ED with symptoms as described above.  No focal neuro deficits.  Patient given IV fluids and will continue with IV rehydration as I do suspect some component of dehydration and vasovagal.  No dysrhythmia on the monitor but will keep patient on the cardiac monitor to evaluate for dysrhythmia.  Will order serial enzymes to further risk stratify for ACS.  Not clinically consistent with PE heart failure or seizure.  Clinical Course as of Dec 28 2017  Wed Dec 28, 2017  1858 She reassessed.  Again requesting discharge home.  Denies any discomfort, lightheadedness shortness of breath.  To ambulate with steady gait.  Tolerating oral hydration.  Do suspect some component of orthostasis or vasovagal syncope now resolved with IV fluids.  Denies any dysuria, odor or discomfort.  This  point do believe patient stable and appropriate for outpatient follow-up.  She is close follow-up with cardiology early next week discussed signs and symptoms for which she should return to the ER.   [PR]    Clinical Course User Index [PR] Merlyn Lot, MD     As part of my medical decision making, I reviewed the following data within the [facility] notes reviewed and incorporated, Labs reviewed, notes from prior ED visits and [place] Controlled Substance Database   ____________________________________________   FINAL CLINICAL IMPRESSION(S) / ED DIAGNOSES  Final diagnoses:  Syncope, unspecified syncope type      NEW MEDICATIONS STARTED DURING THIS VISIT:  Discharge Medication List as of 12/28/2017  7:01 PM       Note:  This document was prepared using Dragon voice recognition software and may include unintentional dictation errors.    Merlyn Lot, MD 12/28/17 2019

## 2017-12-29 ENCOUNTER — Telehealth: Payer: Self-pay | Admitting: Obstetrics and Gynecology

## 2017-12-29 MED ORDER — CEPHALEXIN 500 MG PO CAPS: 500.0000 mg | ORAL_CAPSULE | Freq: Two times a day (BID) | ORAL | 0 refills | Status: DC

## 2017-12-29 NOTE — Telephone Encounter (Signed)
The patient called again and stated that she needs an antibiotic for a UTI, The patient stated that the medication that starts with a "M" does not work well for her and would prefer something else. Please advise.

## 2017-12-29 NOTE — Telephone Encounter (Signed)
Pt seen yesterday in ED for syncope. U/a showed mod leuks. No other issues. Pt states ED wanted [place] to treat her. Per mad ok to give keflex. Pt aware. Med erx.

## 2017-12-29 NOTE — Telephone Encounter (Signed)
The patient called and stated that she would like to speak with Joyice Faster, the patient stated that "It is very important" Please advise.

## 2018-01-05 ENCOUNTER — Inpatient Hospital Stay: Payer: Medicare Other | Attending: Hematology and Oncology

## 2018-01-05 ENCOUNTER — Encounter: Payer: Self-pay | Admitting: Hematology and Oncology

## 2018-01-05 ENCOUNTER — Other Ambulatory Visit: Payer: Self-pay | Admitting: Hematology and Oncology

## 2018-01-05 ENCOUNTER — Inpatient Hospital Stay (HOSPITAL_BASED_OUTPATIENT_CLINIC_OR_DEPARTMENT_OTHER): Payer: Medicare Other | Admitting: Hematology and Oncology

## 2018-01-05 VITALS — BP 111/70 | HR 89 | Resp 18 | Ht 60.0 in | Wt 114.4 lb

## 2018-01-05 DIAGNOSIS — R634 Abnormal weight loss: Secondary | ICD-10-CM | POA: Insufficient documentation

## 2018-01-05 DIAGNOSIS — Z79899 Other long term (current) drug therapy: Secondary | ICD-10-CM

## 2018-01-05 DIAGNOSIS — D497 Neoplasm of unspecified behavior of endocrine glands and other parts of nervous system: Secondary | ICD-10-CM | POA: Diagnosis not present

## 2018-01-05 DIAGNOSIS — Z7189 Other specified counseling: Secondary | ICD-10-CM | POA: Insufficient documentation

## 2018-01-05 DIAGNOSIS — Z923 Personal history of irradiation: Secondary | ICD-10-CM | POA: Diagnosis not present

## 2018-01-05 DIAGNOSIS — E039 Hypothyroidism, unspecified: Secondary | ICD-10-CM | POA: Insufficient documentation

## 2018-01-05 DIAGNOSIS — M1990 Unspecified osteoarthritis, unspecified site: Secondary | ICD-10-CM

## 2018-01-05 DIAGNOSIS — C73 Malignant neoplasm of thyroid gland: Secondary | ICD-10-CM | POA: Diagnosis not present

## 2018-01-05 DIAGNOSIS — I10 Essential (primary) hypertension: Secondary | ICD-10-CM | POA: Insufficient documentation

## 2018-01-05 DIAGNOSIS — K219 Gastro-esophageal reflux disease without esophagitis: Secondary | ICD-10-CM | POA: Diagnosis not present

## 2018-01-05 DIAGNOSIS — M5136 Other intervertebral disc degeneration, lumbar region: Secondary | ICD-10-CM | POA: Insufficient documentation

## 2018-01-05 DIAGNOSIS — C7800 Secondary malignant neoplasm of unspecified lung: Secondary | ICD-10-CM

## 2018-01-05 DIAGNOSIS — C778 Secondary and unspecified malignant neoplasm of lymph nodes of multiple regions: Secondary | ICD-10-CM

## 2018-01-05 LAB — CBC WITH DIFFERENTIAL/PLATELET
Basophils Absolute: 0.1 10*3/uL (ref 0–0.1)
Basophils Relative: 1 %
Eosinophils Absolute: 0.9 10*3/uL — ABNORMAL HIGH (ref 0–0.7)
Eosinophils Relative: 11 %
HCT: 36.2 % (ref 35.0–47.0)
Hemoglobin: 12.5 g/dL (ref 12.0–16.0)
Lymphocytes Relative: 14 %
Lymphs Abs: 1.2 10*3/uL (ref 1.0–3.6)
MCH: 31.3 pg (ref 26.0–34.0)
MCHC: 34.4 g/dL (ref 32.0–36.0)
MCV: 90.9 fL (ref 80.0–100.0)
Monocytes Absolute: 0.9 10*3/uL (ref 0.2–0.9)
Monocytes Relative: 11 %
Neutro Abs: 5.1 10*3/uL (ref 1.4–6.5)
Neutrophils Relative %: 63 %
Platelets: 252 10*3/uL (ref 150–440)
RBC: 3.98 MIL/uL (ref 3.80–5.20)
RDW: 13.5 % (ref 11.5–14.5)
WBC: 8.1 10*3/uL (ref 3.6–11.0)

## 2018-01-05 LAB — COMPREHENSIVE METABOLIC PANEL
ALT: 55 U/L — ABNORMAL HIGH (ref 14–54)
AST: 56 U/L — ABNORMAL HIGH (ref 15–41)
Albumin: 3.7 g/dL (ref 3.5–5.0)
Alkaline Phosphatase: 129 U/L — ABNORMAL HIGH (ref 38–126)
Anion gap: 12 (ref 5–15)
BUN: 18 mg/dL (ref 6–20)
CO2: 23 mmol/L (ref 22–32)
Calcium: 8.4 mg/dL — ABNORMAL LOW (ref 8.9–10.3)
Chloride: 99 mmol/L — ABNORMAL LOW (ref 101–111)
Creatinine, Ser: 1.22 mg/dL — ABNORMAL HIGH (ref 0.44–1.00)
GFR calc Af Amer: 46 mL/min — ABNORMAL LOW (ref >60)
GFR calc non Af Amer: 40 mL/min — ABNORMAL LOW (ref >60)
Glucose, Bld: 182 mg/dL — ABNORMAL HIGH (ref 65–99)
Potassium: 3.7 mmol/L (ref 3.5–5.1)
Sodium: 134 mmol/L — ABNORMAL LOW (ref 135–145)
Total Bilirubin: 0.7 mg/dL (ref 0.3–1.2)
Total Protein: 6.9 g/dL (ref 6.5–8.1)

## 2018-01-05 LAB — T4, FREE: Free T4: 1.07 ng/dL (ref 0.61–1.12)

## 2018-01-05 NOTE — Progress Notes (Signed)
Patient states that she passed out at beauty shop last Wednesday and EMS was called. Stayed at [facility] for about 8 hours. Dehydration diagnosis.

## 2018-01-05 NOTE — Progress Notes (Signed)
[facility] day:  01/05/18  Chief Complaint: Laura Walters is a 84 y.o. female with thyroid carcinoma and pituitary tumor who is seen for 6 month assessment.  HPI:  The patient was last seen in the medical oncology clinic on 07/07/2017.  At that time, she was doing well. She denied any B symptoms. Patient was eating well, with no weight loss. Patient had intertriginous rash under her LEFT breast.  She denied any neurologic symptoms.  Exam was stable. Labs were unremarkable.   TSH was 0.158 and free T4 was 1.16.  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  She denied any chest pain or shortness of breath.  She had no seizure activity.  She was hypotensive per EMT.  She received IVF bolus. EKG was unremarkable.  Etiology was felt secondary to orthostatic hypotension or vasovagal syncope.  During the interim, patient is doing well today. There are no acute concerns. Patient denies B symptoms and interval infections. Patient continues on her levothyroxine as prescribed.  Synthroid is being managed by Dr. Wyline Beady.  She states her dose was "decreased from 7 pills/week to 6 1/2 pills".  Wednesdays she takes a half pill.  Patient is "not eating right".  She lives alone and does not cook as much. Patient states, "when you live alone and it is just you and the cat, then meals get sketchy". Patient complains of pain in her LEFT heel in the mornings only.   Weight has decreased by 8 pounds. Patient eats a good breakfast. She eats lunch out everyday.  Patient continues to follow up with endocrinology (Dr Hanley Ben) and radiation oncology (Dr Rozetta Nunnery) in [place].  She states that her head MRI in [place] revealed "no growth".  Patient denies pain in the clinic today. .   Past Medical History:  Diagnosis Date  . Arthritis   . Asthma   . Back pain   . Cancer of thyroid ([place]) 03/30/2015  . Cardiomyopathy ([place])    idiopathic   . Chest pain    non cardiac  . Chokes    easily   swallow test negative  . Constipation   . Cystocele   . DDD (degenerative disc disease), lumbar   . Disc disorder   . GERD (gastroesophageal reflux disease)   . H/O wheezing    advair as needed  . Hypertension   . Hypothyroidism   . IBS (irritable bowel syndrome)   . Low back pain   . Neuropathy   . Nocturia   . Pelvic pain in female   . RAD (reactive airway disease)   . Rectocele   . Seasonal allergies   . Spinal stenosis   . Urge incontinence   . Vaginal atrophy   . Vaginal polyp   . Vulvitis     Past Surgical History:  Procedure Laterality Date  . BREAST BIOPSY Right 86 and 92   2 bx-neg  . CATARACT EXTRACTION W/PHACO Right 05/19/2016   Procedure: CATARACT EXTRACTION PHACO AND INTRAOCULAR LENS PLACEMENT (IOC);  Surgeon: Estill Cotta, MD;  Location: [facility] ORS;  Service: Ophthalmology;  Laterality: Right;  Lot# [id number] [place]:   1:25.3 AP%:  24.9% CDE:  40.11  . CATARACT EXTRACTION W/PHACO Left 08/11/2016   Procedure: CATARACT EXTRACTION PHACO AND INTRAOCULAR LENS PLACEMENT (IOC);  Surgeon: Estill Cotta, MD;  Location: [facility] ORS;  Service: Ophthalmology;  Laterality: Left;  [place] 1.24 AP% 26.1 CDE 37.79  FLUID PACK LOT # [id number]  . DILATION AND CURETTAGE OF UTERUS    . HEMORROIDECTOMY     x 2  . JOINT REPLACEMENT     left hip  . KNEE ARTHROPLASTY Right 01/05/2016   Procedure: COMPUTER ASSISTED TOTAL KNEE ARTHROPLASTY;  Surgeon: Dereck Leep, MD;  Location: [facility] ORS;  Service: Orthopedics;  Laterality: Right;  . KNEE ARTHROSCOPY Right 05/19/2015   Procedure: Right knee arthrosocpy medail and lateral menisectomy, chondroplasty ;  Surgeon: Dereck Leep, MD;  Location: [facility] ORS;  Service: Orthopedics;  Laterality: Right;  . Left total hip arthroplasty    . PAROTIDECTOMY    . PITUITARY SURGERY    . THYROID SURGERY     bx  . THYROIDECTOMY      Family History  Problem Relation Age of Onset  . Heart disease  Father   . Diabetes Paternal Uncle   . Cancer Neg Hx   . Breast cancer Neg Hx     Social History:  reports that she has never smoked. She has never used smokeless tobacco. She reports that she does not drink alcohol or use drugs.  The patient lives alone in [place]. She has a son named Laura Walters, and a daughter named Laura Walters.  The patient is alone today.  Allergies:  Allergies  Allergen Reactions  . Aleve [Naproxen]   . Codeine Hives and Itching  . Penicillins Hives    Has patient had a PCN reaction causing immediate rash, facial/tongue/throat swelling, SOB or lightheadedness with hypotension: hives Has patient had a PCN reaction causing severe rash involving mucus membranes or skin necrosis: no Has patient had a PCN reaction that required hospitalization no Has patient had a PCN reaction occurring within the last 10 years: no If all of the above answers are "NO", then may proceed with Cephalosporin use.  . Sulfa Antibiotics Hives    Current Medications: Current Outpatient Medications  Medication Sig Dispense Refill  . diphenhydrAMINE (BENADRYL) 25 MG tablet Take 25 mg by mouth every 6 (six) hours as needed.    . docusate sodium (COLACE) 100 MG capsule Take by mouth.    . DULoxetine (CYMBALTA) 60 MG capsule Take 60 mg by mouth daily.     . ferrous sulfate 325 (65 FE) MG tablet Take 325 mg by mouth 2 (two) times daily with a meal.     . Fluticasone-Salmeterol (ADVAIR DISKUS) 250-50 MCG/DOSE AEPB Inhale 1 puff into the lungs 2 (two) times daily.    . furosemide (LASIX) 20 MG tablet TAKE 1 TABLET BY MOUTH TWICE DAILY.    Marland Kitchen gabapentin (NEURONTIN) 400 MG capsule Take 800 mg by mouth 3 (three) times daily.     Marland Kitchen levothyroxine (SYNTHROID, LEVOTHROID) 88 MCG tablet     . MAGNESIUM PO Take by mouth.    . Multiple Vitamins-Minerals (PRESERVISION AREDS 2) CAPS Take 1 capsule by mouth 2 (two) times daily.    . potassium chloride (K-DUR,KLOR-CON) 10 MEQ tablet Take 10 mEq by mouth daily.     .  ranitidine (ZANTAC) 150 MG tablet Take 150 mg by mouth daily.    . carvedilol (COREG) 3.125 MG tablet Take 3.125 mg by mouth 2 (two) times daily with a meal.     . cephALEXin (KEFLEX) 500 MG capsule Take 1 capsule (500 mg total) by mouth 2 (two) times daily. (Patient not taking: Reported on 01/05/2018) 14 capsule 0  . fexofenadine-pseudoephedrine (ALLEGRA-D 24) 180-240 MG 24 hr tablet Take 1 tablet by mouth at bedtime.  hs    . fluticasone (FLONASE) 50 MCG/ACT nasal spray Place 2 sprays into the nose daily as needed for allergies.     Marland Kitchen nystatin (MYCOSTATIN/NYSTOP) powder Apply topically 4 (four) times daily. (Patient not taking: Reported on 01/05/2018) 15 g 0  . omeprazole (PRILOSEC) 40 MG capsule Take 40 mg by mouth daily.      No current facility-administered medications for this visit.     Review of Systems:  GENERAL:  Feels "alright".  No fevers or sweats.  Weight down 8 pounds since last visit. PERFORMANCE STATUS (ECOG):  1 HEENT:  Dry mouth. No visual changes, runny nose, sore throat, mouth sores or tenderness. Lungs: No shortness of breath or cough.  No hemoptysis. Cardiac:  No chest pain, palpitations, orthopnea, or PND. GI:  No nausea, vomiting, diarrhea, constipation, melena or hematochezia. GU:  No urgency, frequency, dysuria, or hematuria. Musculoskeletal:  LEFT heel pain in the mornings. Chronic back pain secondary to stenosis.  Arthritis.  No muscle tenderness. Extremities:  Intermittent swelling of right hand.  No pain. Skin: No rashes or skin changes. Neuro:  No headache, numbness or weakness, balance or coordination issues. Endocrine:  No diabetes.  On Synthroid s/p thyroidectomy for thyroid cancer (see HPI).  No hot flashes or night sweats. Psych:  No mood changes, depression or anxiety. Pain:  Back pain (chronic). Review of systems:  All other systems reviewed and found to be negative.  Physical Exam: Blood pressure 111/70, pulse 89, resp. rate 18, height 5' (1.524 m),  weight 114 lb 7 oz (51.9 kg). GENERAL:  Well developed, well nourished, elderly woman sitting comfortably in the exam room in no acute distress.  She has a cane at her side. MENTAL STATUS:  Alert and oriented to person, place and time. HEAD:  Curly white hair.  Normocephalic, atraumatic, face symmetric, no Cushingoid features. EYES:  Glasses.  Blue eyes.  Pupils equal round and reactive to light and accomodation.  No conjunctivitis or scleral icterus. ENT:  Oropharynx clear without lesion.  Tongue normal. Mucous membranes dry.  RESPIRATORY:  Clear to auscultation without rales, wheezes or rhonchi. CARDIOVASCULAR:  Regular rate and rhythm without murmur, rub or gallop. ABDOMEN:  Soft, non-tender, with active bowel sounds, and no hepatosplenomegaly.  No masses. SKIN: No rashes, ulcers or lesions. EXTREMITIES: No edema, no skin discoloration or tenderness.  No palpable cords. LYMPH NODES: No palpable cervical, supraclavicular, axillary or inguinal adenopathy  NEUROLOGICAL: Unremarkable. PSYCH:  Appropriate.   Appointment on 01/05/2018  Component Date Value Ref Range Status  . Sodium 01/05/2018 134* 135 - 145 mmol/L Final  . Potassium 01/05/2018 3.7  3.5 - 5.1 mmol/L Final  . Chloride 01/05/2018 99* 101 - 111 mmol/L Final  . CO2 01/05/2018 23  22 - 32 mmol/L Final  . Glucose, Bld 01/05/2018 182* 65 - 99 mg/dL Final  . BUN 01/05/2018 18  6 - 20 mg/dL Final  . Creatinine, Ser 01/05/2018 1.22* 0.44 - 1.00 mg/dL Final  . Calcium 01/05/2018 8.4* 8.9 - 10.3 mg/dL Final  . Total Protein 01/05/2018 6.9  6.5 - 8.1 g/dL Final  . Albumin 01/05/2018 3.7  3.5 - 5.0 g/dL Final  . AST 01/05/2018 56* 15 - 41 U/L Final  . ALT 01/05/2018 55* 14 - 54 U/L Final  . Alkaline Phosphatase 01/05/2018 129* 38 - 126 U/L Final  . Total Bilirubin 01/05/2018 0.7  0.3 - 1.2 mg/dL Final  . GFR calc non Af Amer 01/05/2018 40* >60 mL/min Final  .  GFR calc Af Amer 01/05/2018 46* >60 mL/min Final   Comment: (NOTE) The  eGFR has been calculated using the CKD EPI equation. This calculation has not been validated in all clinical situations. eGFR's persistently <60 mL/min signify possible Chronic Kidney Disease.   Georgiann Hahn gap 01/05/2018 12  5 - 15 Final   Performed at [facility], [address] [place], [place] [address]  . WBC 01/05/2018 8.1  3.6 - 11.0 K/uL Final  . RBC 01/05/2018 3.98  3.80 - 5.20 MIL/uL Final  . Hemoglobin 01/05/2018 12.5  12.0 - 16.0 g/dL Final  . HCT 01/05/2018 36.2  35.0 - 47.0 % Final  . MCV 01/05/2018 90.9  80.0 - 100.0 fL Final  . MCH 01/05/2018 31.3  26.0 - 34.0 pg Final  . MCHC 01/05/2018 34.4  32.0 - 36.0 g/dL Final  . RDW 01/05/2018 13.5  11.5 - 14.5 % Final  . Platelets 01/05/2018 252  150 - 440 K/uL Final  . Neutrophils Relative % 01/05/2018 63  % Final  . Neutro Abs 01/05/2018 5.1  1.4 - 6.5 K/uL Final  . Lymphocytes Relative 01/05/2018 14  % Final  . Lymphs Abs 01/05/2018 1.2  1.0 - 3.6 K/uL Final  . Monocytes Relative 01/05/2018 11  % Final  . Monocytes Absolute 01/05/2018 0.9  0.2 - 0.9 K/uL Final  . Eosinophils Relative 01/05/2018 11  % Final  . Eosinophils Absolute 01/05/2018 0.9* 0 - 0.7 K/uL Final  . Basophils Relative 01/05/2018 1  % Final  . Basophils Absolute 01/05/2018 0.1  0 - 0.1 K/uL Final   Performed at [facility], [address], [place], [place] [address]    Assessment:  MAYDELL KNOEBEL is a 84 y.o. female with a recurrent thyroid cancer.  She was diagnosed with thyroid carcinoma in 08/2002.  She underwent thyroidectomy and lymph node dissection.  She received I-131 in 10/2002.    Her thyroid cancer recurred in 2012. CT scans in 01/2011 revealed lung nodules.  She received I-131 in 08/2011.  Follow-up scans revealed stable disease.  PET scan on 01/01/2016 revealed progressive disease, as evidenced by increased size and hypermetabolism of cervical nodes, thoracic nodes, and pulmonary nodules.  There was a foci of osseous hypermetabolism  (indeterminate). Right posterior ninth rib and transverse process hypermetabolism could be posttraumatic, given suggestion of nondisplaced rib fracture.  I-131 scan on 02/16/2016 revealed no evidence of I-131 avid thyroid carcinoma.  Ultrasound guided biopsy of the left supraclavicular lymph node on 03/24/2016 revealed metastatic papillary thyroid carcinoma.  Dr Hanley Ben adjusts her Synthroid.  Chest CT on 07/02/2016 revealed stable metastatic disease to the lungs, with minimal enlargement of some pulmonary nodules, but no new pulmonary nodules. There was a new healing fracture of the anterior aspect of the left fifth rib.   PET scan on 12/31/2016 revealed generally similar appearance, with several abnormal hypermetabolic lymph nodes along the thoracic inlet level, and scattered hypermetabolic pulmonary nodules favoring the lung bases. The overall tumor burden has not significantly changed.  There was a 3.1 x 1.7 cm mass arising from the left side of the sella turcica intracranially which probably has some low-grade metabolic activity.   She received 5 cranial radiation treatments in 03/2017 in [place].  Head MRI during the interim revealed "no growth" per patient (no report available).  Chest CT on 07/04/2017 revealed a mixed response to therapy, with similar number of numerous pulmonary nodules throughout the lungs bilaterally, but with some of these nodules demonstrating  slight growth (2 mm) and yet other nodules demonstrating slight regression.  Symptomatically, she feels "alright".  She has lost weight.  She denies any neurologic symptoms.  Exam is stable. Labs unremarkable.   Plan: 1.  Labs today:  CBC with diff, CMP, TSH, T4, thyroglobulin. 2.  Schedule PET scan.  3.. RTC after PET scan for MD assessment to review the results.    Honor Loh, NP  01/05/2018, 3:05 PM   I saw and evaluated the patient, participating in the key portions of the service and reviewing pertinent diagnostic  studies and records.  I reviewed the nurse practitioner's note and agree with the findings and the plan.  The assessment and plan were discussed with the patient.  Several questions were asked by the patient and answered.   Nolon Stalls, MD 01/05/2018,3:05 PM

## 2018-01-06 LAB — THYROID PANEL WITH TSH
Free Thyroxine Index: 2.1 (ref 1.2–4.9)
T3 Uptake Ratio: 29 % (ref 24–39)
T4, Total: 7.1 ug/dL (ref 4.5–12.0)
TSH: 0.04 u[IU]/mL — ABNORMAL LOW (ref 0.450–4.500)

## 2018-01-10 LAB — THYROGLOBULIN LEVEL: Thyroglobulin: <2 ng/mL

## 2018-01-12 ENCOUNTER — Ambulatory Visit: Payer: Medicare Other

## 2018-01-17 ENCOUNTER — Encounter
Admission: RE | Admit: 2018-01-17 | Discharge: 2018-01-17 | Disposition: A | Discharge status: Home / Self Care | Payer: Medicare Other | Source: Ambulatory Visit | Attending: Urgent Care | Admitting: Urgent Care

## 2018-01-17 DIAGNOSIS — C7800 Secondary malignant neoplasm of unspecified lung: Secondary | ICD-10-CM | POA: Diagnosis present

## 2018-01-17 DIAGNOSIS — C73 Malignant neoplasm of thyroid gland: Secondary | ICD-10-CM | POA: Insufficient documentation

## 2018-01-17 DIAGNOSIS — D497 Neoplasm of unspecified behavior of endocrine glands and other parts of nervous system: Secondary | ICD-10-CM | POA: Insufficient documentation

## 2018-01-17 LAB — GLUCOSE, CAPILLARY: Glucose-Capillary: 90 mg/dL (ref 65–99)

## 2018-01-17 MED ORDER — FLUDEOXYGLUCOSE F - 18 (FDG) INJECTION
5.8700 | Freq: Once | INTRAVENOUS | Status: AC | PRN
  Administered 2018-01-17: 5.87 via INTRAVENOUS

## 2018-01-19 ENCOUNTER — Encounter: Payer: Self-pay | Admitting: Hematology and Oncology

## 2018-01-19 ENCOUNTER — Inpatient Hospital Stay (HOSPITAL_BASED_OUTPATIENT_CLINIC_OR_DEPARTMENT_OTHER): Payer: Medicare Other | Admitting: Hematology and Oncology

## 2018-01-19 ENCOUNTER — Other Ambulatory Visit: Payer: Self-pay

## 2018-01-19 VITALS — BP 110/62 | HR 87 | Temp 97.9°F | Resp 18 | Wt 119.2 lb

## 2018-01-19 DIAGNOSIS — C7800 Secondary malignant neoplasm of unspecified lung: Secondary | ICD-10-CM

## 2018-01-19 DIAGNOSIS — C73 Malignant neoplasm of thyroid gland: Secondary | ICD-10-CM | POA: Diagnosis not present

## 2018-01-19 DIAGNOSIS — M1990 Unspecified osteoarthritis, unspecified site: Secondary | ICD-10-CM

## 2018-01-19 DIAGNOSIS — M5136 Other intervertebral disc degeneration, lumbar region: Secondary | ICD-10-CM

## 2018-01-19 DIAGNOSIS — R634 Abnormal weight loss: Secondary | ICD-10-CM | POA: Diagnosis not present

## 2018-01-19 DIAGNOSIS — C778 Secondary and unspecified malignant neoplasm of lymph nodes of multiple regions: Secondary | ICD-10-CM

## 2018-01-19 DIAGNOSIS — K219 Gastro-esophageal reflux disease without esophagitis: Secondary | ICD-10-CM | POA: Diagnosis not present

## 2018-01-19 DIAGNOSIS — Z923 Personal history of irradiation: Secondary | ICD-10-CM

## 2018-01-19 DIAGNOSIS — D497 Neoplasm of unspecified behavior of endocrine glands and other parts of nervous system: Secondary | ICD-10-CM

## 2018-01-19 DIAGNOSIS — Z79899 Other long term (current) drug therapy: Secondary | ICD-10-CM

## 2018-01-19 DIAGNOSIS — E039 Hypothyroidism, unspecified: Secondary | ICD-10-CM | POA: Diagnosis not present

## 2018-01-19 DIAGNOSIS — Z7189 Other specified counseling: Secondary | ICD-10-CM

## 2018-01-19 DIAGNOSIS — C770 Secondary and unspecified malignant neoplasm of lymph nodes of head, face and neck: Secondary | ICD-10-CM

## 2018-01-19 DIAGNOSIS — I10 Essential (primary) hypertension: Secondary | ICD-10-CM

## 2018-01-19 NOTE — Progress Notes (Signed)
Patient here for follow-up. Offers no complaints.  °

## 2018-01-19 NOTE — Progress Notes (Signed)
[facility] day:  01/19/18  Chief Complaint: Laura Walters is a 84 y.o. female with thyroid carcinoma and pituitary tumor who is seen for review of PET scan.  HPI:  The patient was last seen in the medical oncology clinic on 01/05/2018.  At that time, she felt "alright".  She had lost weight.  She denied any neurologic symptoms.  Exam was stable.  Labs were unremarkable. Thyroglobulin was < 2.0.  TSH was 0.040.  T4 was 7.1.  Free T4 was 1.07.  PET scan on 01/17/2018 revealed overall similar appearance of scattered hypermetabolic pulmonary nodules and left paratracheal lymph nodes. Left supraclavicular nodal mass was slightly increased in size (1.7 cm) in the interval.  The sellar and left cavernous sinus mass with suprasellar extension was similar to previous exam (2.6 cm; previously 3.1 cm) compatible with previously characterized recurrence of patient's pituitary tumor.  There was aortic atherosclerosis, two vessel coronary artery atherosclerotic calcifications, hiatal hernia, and bilateral chronic sinus inflammation.   During the interim, she has done well.  She denies any new concerns.   Past Medical History:  Diagnosis Date  . Arthritis   . Asthma   . Back pain   . Cancer of thyroid ([place]) 03/30/2015  . Cardiomyopathy ([place])    idiopathic  . Chest pain    non cardiac  . Chokes    easily   swallow test negative  . Constipation   . Cystocele   . DDD (degenerative disc disease), lumbar   . Disc disorder   . GERD (gastroesophageal reflux disease)   . H/O wheezing    advair as needed  . Hypertension   . Hypothyroidism   . IBS (irritable bowel syndrome)   . Low back pain   . Neuropathy   . Nocturia   . Pelvic pain in female   . RAD (reactive airway disease)   . Rectocele   . Seasonal allergies   . Spinal stenosis   . Urge incontinence   . Vaginal atrophy   . Vaginal polyp   . Vulvitis     Past Surgical History:  Procedure  Laterality Date  . BREAST BIOPSY Right 86 and 92   2 bx-neg  . CATARACT EXTRACTION W/PHACO Right 05/19/2016   Procedure: CATARACT EXTRACTION PHACO AND INTRAOCULAR LENS PLACEMENT (IOC);  Surgeon: Estill Cotta, MD;  Location: [facility] ORS;  Service: Ophthalmology;  Laterality: Right;  Lot# [id number] [place]:   1:25.3 AP%:  24.9% CDE:  40.11  . CATARACT EXTRACTION W/PHACO Left 08/11/2016   Procedure: CATARACT EXTRACTION PHACO AND INTRAOCULAR LENS PLACEMENT (IOC);  Surgeon: Estill Cotta, MD;  Location: [facility] ORS;  Service: Ophthalmology;  Laterality: Left;  [place] 1.24 AP% 26.1 CDE 37.79 FLUID PACK LOT # [id number]  . DILATION AND CURETTAGE OF UTERUS    . HEMORROIDECTOMY     x 2  . JOINT REPLACEMENT     left hip  . KNEE ARTHROPLASTY Right 01/05/2016   Procedure: COMPUTER ASSISTED TOTAL KNEE ARTHROPLASTY;  Surgeon: Dereck Leep, MD;  Location: [facility] ORS;  Service: Orthopedics;  Laterality: Right;  . KNEE ARTHROSCOPY Right 05/19/2015   Procedure: Right knee arthrosocpy medail and lateral menisectomy, chondroplasty ;  Surgeon: Dereck Leep, MD;  Location: [facility] ORS;  Service: Orthopedics;  Laterality: Right;  . Left total hip arthroplasty    . PAROTIDECTOMY    . PITUITARY SURGERY    . THYROID SURGERY     bx  . THYROIDECTOMY  Family History  Problem Relation Age of Onset  . Heart disease Father   . Diabetes Paternal Uncle   . Cancer Neg Hx   . Breast cancer Neg Hx     Social History:  reports that she has never smoked. She has never used smokeless tobacco. She reports that she does not drink alcohol or use drugs.  The patient lives alone in [place]. She has a son named [name], and a daughter named [name].  The patient is alone today.  Allergies:  Allergies  Allergen Reactions  . Aleve [Naproxen]   . Codeine Hives and Itching  . Penicillins Hives    Has patient had a PCN reaction causing immediate rash, facial/tongue/throat swelling, SOB or lightheadedness with hypotension: hives Has  patient had a PCN reaction causing severe rash involving mucus membranes or skin necrosis: no Has patient had a PCN reaction that required hospitalization no Has patient had a PCN reaction occurring within the last 10 years: no If all of the above answers are "NO", then may proceed with Cephalosporin use.  . Sulfa Antibiotics Hives    Current Medications: Current Outpatient Medications  Medication Sig Dispense Refill  . diphenhydrAMINE (BENADRYL) 25 MG tablet Take 25 mg by mouth every 6 (six) hours as needed.    . docusate sodium (COLACE) 100 MG capsule Take by mouth.    . DULoxetine (CYMBALTA) 60 MG capsule Take 60 mg by mouth daily.     . ferrous sulfate 325 (65 FE) MG tablet Take 325 mg by mouth 2 (two) times daily with a meal.     . Fluticasone-Salmeterol (ADVAIR DISKUS) 250-50 MCG/DOSE AEPB Inhale 1 puff into the lungs 2 (two) times daily.    . furosemide (LASIX) 20 MG tablet TAKE 1 TABLET BY MOUTH TWICE DAILY.    Marland Kitchen gabapentin (NEURONTIN) 400 MG capsule Take 800 mg by mouth 3 (three) times daily.     Marland Kitchen levothyroxine (SYNTHROID, LEVOTHROID) 88 MCG tablet     . MAGNESIUM PO Take by mouth.    . Multiple Vitamins-Minerals (PRESERVISION AREDS 2) CAPS Take 1 capsule by mouth 2 (two) times daily.    Marland Kitchen nystatin (MYCOSTATIN/NYSTOP) powder Apply topically 4 (four) times daily. 15 g 0  . omeprazole (PRILOSEC) 40 MG capsule Take 40 mg by mouth daily.     . potassium chloride (K-DUR,KLOR-CON) 10 MEQ tablet Take 10 mEq by mouth daily.     . ranitidine (ZANTAC) 150 MG tablet Take 150 mg by mouth daily.    . traMADol (ULTRAM) 50 MG tablet Take 50 mg by mouth every 6 (six) hours as needed.     No current facility-administered medications for this visit.     Review of Systems:  GENERAL:  Feels "the same".  No fevers, sweats or weight loss. Always hot.  Weight up 5 pounds since last visit. PERFORMANCE STATUS (ECOG):  1 HEENT:  Dry mouth.  No visual changes, runny nose, sore throat, mouth sores or  tenderness. Lungs: No shortness of breath or cough.  No hemoptysis. Cardiac:  No chest pain, palpitations, orthopnea, or PND. GI:  No nausea, vomiting, diarrhea, constipation, melena or hematochezia. GU:  No urgency, frequency, dysuria, or hematuria. Musculoskeletal:  Left heel pain in AM.  Chronic back pain secondary to stenosis.  Arthritis.  No muscle tenderness. Extremities:  No pain or swelling. Skin:  No rashes or skin changes. Neuro:  No headache, numbness or weakness, balance or coordination issues. Endocrine:  No diabetes.  On Synthroid s/p thyroidectomy.  No hot flashes or night sweats. Psych:  No mood changes, depression or anxiety. Pain:  Chronic back pain. Review of systems:  All other systems reviewed and found to be negative.   Physical Exam: Blood pressure 110/62, pulse 87, temperature 97.9 F (36.6 C), temperature source Oral, resp. rate 18, weight 119 lb 3.2 oz (54.1 kg). GENERAL:  Well developed, well nourished, elderly woman sitting comfortably in the exam room in no acute distress.  She has a cane at her side. MENTAL STATUS:  Alert and oriented to person, place and time. HEAD:  Curly white hair.  Normocephalic, atraumatic, face symmetric, no Cushingoid features. EYES:  Glasses.  Blue eyes.  No conjunctivitis or scleral icterus. NEUROLOGICAL: Unremarkable. PSYCH:  Appropriate.    Hospital Outpatient Visit on 01/17/2018  Component Date Value Ref Range Status  . Glucose-Capillary 01/17/2018 90  65 - 99 mg/dL Final    Assessment:  Laura Walters is a 84 y.o. female with a recurrent thyroid cancer.  She was diagnosed with thyroid carcinoma in 08/2002.  She underwent thyroidectomy and lymph node dissection.  She received I-131 in 10/2002.    Her thyroid cancer recurred in 2012. CT scans in 01/2011 revealed lung nodules.  She received I-131 in 08/2011.  Follow-up scans revealed stable disease.  PET scan on 01/01/2016 revealed progressive disease, as evidenced by increased  size and hypermetabolism of cervical nodes, thoracic nodes, and pulmonary nodules.  There was a foci of osseous hypermetabolism (indeterminate). Right posterior ninth rib and transverse process hypermetabolism could be posttraumatic, given suggestion of nondisplaced rib fracture.  I-131 scan on 02/16/2016 revealed no evidence of I-131 avid thyroid carcinoma.  Ultrasound guided biopsy of the left supraclavicular lymph node on 03/24/2016 revealed metastatic papillary thyroid carcinoma.  Dr Hanley Ben adjusts her Synthroid.  Chest CT on 07/02/2016 revealed stable metastatic disease to the lungs, with minimal enlargement of some pulmonary nodules, but no new pulmonary nodules. There was a new healing fracture of the anterior aspect of the left fifth rib.   PET scan on 12/31/2016 revealed generally similar appearance, with several abnormal hypermetabolic lymph nodes along the thoracic inlet level, and scattered hypermetabolic pulmonary nodules favoring the lung bases. The overall tumor burden has not significantly changed.  There was a 3.1 x 1.7 cm mass arising from the left side of the sella turcica intracranially which probably has some low-grade metabolic activity.   Chest CT on 07/04/2017 revealed a mixed response to therapy, with similar number of numerous pulmonary nodules throughout the lungs bilaterally, but with some of these nodules demonstrating slight growth (2 mm) and yet other nodules demonstrating slight regression.  PET scan on 01/17/2018 revealed overall similar appearance of scattered hypermetabolic pulmonary nodules and left paratracheal lymph nodes. Left supraclavicular nodal mass was slightly increased in size (1.7 cm) in the interval.  The sellar and left cavernous sinus mass with suprasellar extension was similar to previous exam (2.6 cm; previously 3.1 cm) compatible with previously characterized recurrence of patient's pituitary tumor.   She received 5 cranial radiation treatments in  03/2017 in [place].  Head MRI during the interim revealed "no growth" per patient (no report available).  Chest CT on 07/04/2017 revealed a mixed response to therapy, with similar number of numerous pulmonary nodules throughout the lungs bilaterally, but with some of these nodules demonstrating slight growth (2 mm) and yet other nodules demonstrating slight regression.  Symptomatically, she denies any concerns.  She denies any neurologic symptoms.  Exam is stable.  Labs are  unremarkable.   Plan: 1.  Review interval PET scan- recurrent thyroid cancer is relatively stable except for left supraclavicular nodal mass (1.7 cm).  Consider repeat chest CT in 6 months.  Discuss continued monitoring.  Anticipate follow-up PET scan in 1 year. 2.  Discuss pituitary tumor.  Follow-up head MRI in [place]. 3.  RTC in 6 months for MD assessment and labs (CBC with diff, CMP, TSH, free T4).   Lequita Asal, MD  01/19/2018, 4:35 PM

## 2018-02-22 ENCOUNTER — Ambulatory Visit (INDEPENDENT_AMBULATORY_CARE_PROVIDER_SITE_OTHER): Payer: Medicare Other | Admitting: Obstetrics and Gynecology

## 2018-02-22 ENCOUNTER — Encounter: Payer: Self-pay | Admitting: Obstetrics and Gynecology

## 2018-02-22 VITALS — BP 102/65 | HR 73 | Ht 60.0 in | Wt 117.9 lb

## 2018-02-22 DIAGNOSIS — N816 Rectocele: Secondary | ICD-10-CM

## 2018-02-22 DIAGNOSIS — N8111 Cystocele, midline: Secondary | ICD-10-CM | POA: Diagnosis not present

## 2018-02-22 DIAGNOSIS — N814 Uterovaginal prolapse, unspecified: Secondary | ICD-10-CM

## 2018-02-22 DIAGNOSIS — Z4689 Encounter for fitting and adjustment of other specified devices: Secondary | ICD-10-CM | POA: Diagnosis not present

## 2018-02-22 MED ORDER — OXYQUINOLONE SULFATE 0.025 % VA GEL: 0.0250 g | VAGINAL | 3 refills | Status: DC

## 2018-02-22 NOTE — Patient Instructions (Signed)
1.  Return in 8 weeks for pessary maintenance 2.  Insert Trimosan gel intravaginal once a week

## 2018-02-22 NOTE — Progress Notes (Signed)
Chief complaint: 1. Pessary maintenance 2. History of recurrent UTI  8-week pessary check-gel horn-last visit  (12/28/2017) Patient reports no significant vaginal bleeding, vaginal discharge; she does have mild odor. Trimosan gel is not currently being used intravaginally once a week. Patient denies UTI symptoms.  Past medical history, past surgical history, problem list, medications, and allergies are reviewed  OBJECTIVE: BP 102/65   Pulse 73   Ht 5' (1.524 m)   Wt 117 lb 14.4 oz (53.5 kg)   BMI 23.03 kg/m  Pleasant elderly female in no acute distress. She is alert and oriented. Abdomen: Soft, nontender without organomegaly Pelvic exam: Unchanged External genitalia-normal BUS-normal Vagina-no significant discharge. Minimal erosion with inflammatory changes noted along the right and left sides of the cervix on vaginal sidewall. Slight malodor. No palpable abnormality or tenderness Cervix-parous; no significant erosions noted Uterus-midplane, small, nontender Adnexa-nonpalpable and nontender Rectovaginal-normal external exam  PROCEDURE: Gel horn pessary is removed, cleaned, and reinserted  ASSESSMENT: 1. Pessary maintenance 2. History of recurrent UTI without symptoms today 3. Pelvic organ prolapse, stable with pessary use 4. Small vaginal inflammation/erosions on right and left vaginal sidewalls adjacent to cervix  PLAN: 1. Pessary is removed, cleaned, and reinserted 2. Trimosan gel is to be restarted weekly intravaginally 3. Return in 8 weeks for pessary maintenance or sooner if vaginal discharge, vaginal bleeding, or vaginal odor develops.  A total of 15 minutes were spent face-to-face with the patient during this encounter and over half of that time dealt with counseling and coordination of care.  Brayton Mars, MD  Note: This dictation was prepared with Dragon dictation along with smaller phrase technology. Any transcriptional errors that  result from this process are unintentional.

## 2018-04-04 ENCOUNTER — Telehealth: Payer: Self-pay | Admitting: Obstetrics and Gynecology

## 2018-04-04 NOTE — Telephone Encounter (Signed)
The patient left a voice message at 10:35 requesting a call back within the hr if possible; she has a large pessary and is seeing blood and wants to know if she needs to be seen? Please advise, thanks.

## 2018-04-04 NOTE — Telephone Encounter (Signed)
Pt states she saw dark blood on her pad one day last week. Several days later if was pink when she wiped. She has not noticed anything since Sunday.    Phone was disconnected. Tried to call her back. No answer.

## 2018-04-05 ENCOUNTER — Telehealth: Payer: Self-pay | Admitting: Obstetrics and Gynecology

## 2018-04-05 NOTE — Telephone Encounter (Signed)
Pt is having bleeding since using the trimosan gel. No pain with urination. Appt made for tomorrow at 10:15.

## 2018-04-05 NOTE — Telephone Encounter (Signed)
Spoke with pt  See previous telephone encounter

## 2018-04-05 NOTE — Telephone Encounter (Signed)
The patient called and stated that she was on a call with Joyice Faster and that her phone went dead and she was unable to call back until now. Please advise.

## 2018-04-06 ENCOUNTER — Ambulatory Visit (INDEPENDENT_AMBULATORY_CARE_PROVIDER_SITE_OTHER): Payer: Medicare Other | Admitting: Obstetrics and Gynecology

## 2018-04-06 ENCOUNTER — Encounter: Payer: Self-pay | Admitting: Obstetrics and Gynecology

## 2018-04-06 VITALS — BP 97/61 | HR 73 | Ht 60.0 in | Wt 122.0 lb

## 2018-04-06 DIAGNOSIS — S30814A Abrasion of vagina and vulva, initial encounter: Secondary | ICD-10-CM

## 2018-04-06 DIAGNOSIS — N390 Urinary tract infection, site not specified: Secondary | ICD-10-CM

## 2018-04-06 LAB — POCT URINALYSIS DIPSTICK
BILIRUBIN UA: NEGATIVE
GLUCOSE UA: NEGATIVE
Ketones, UA: NEGATIVE
Nitrite, UA: POSITIVE
ODOR: POSITIVE
Protein, UA: NEGATIVE
Spec Grav, UA: 1.01 (ref 1.010–1.025)
Urobilinogen, UA: 0.2 E.U./dL
pH, UA: 7.5 (ref 5.0–8.0)

## 2018-04-06 MED ORDER — NITROFURANTOIN MONOHYD MACRO 100 MG PO CAPS: 100.0000 mg | ORAL_CAPSULE | Freq: Two times a day (BID) | ORAL | 0 refills | Status: DC

## 2018-04-06 NOTE — Progress Notes (Signed)
Chief complaint: 1.  Vaginal bleeding 2.  Vaginal odor 3.  History of recurrent UTI  Laura Walters presents today for acute evaluation.  She has noted increased vaginal bloody discharge and odor.  She is not experiencing any pelvic pain.  She does have some increased urinary frequency and dysuria. No fever chills or sweats.  No flank pain.   Past medical history: Rectocele Cystocele Urge incontinence Constipation Pelvic organ prolapse (cystocele, rectocele, uterine)  Complete review of systems is negative except for that noted in the HPI  OBJECTIVE: BP 97/61   Pulse 73   Ht 5' (1.524 m)   Wt 122 lb (55.3 kg)   BMI 23.83 kg/m  Pleasant elderly female in no acute distress.  Alert and oriented. Back: No CVA tenderness Abdomen: Soft, nontender Bladder: Nontender Pelvic exam: External genitalia-normal BUS-normal Vagina-yellow blood-tinged secretions, minimal; vaginal abrasions along right and left sidewalls, friable; slight malodor is noted Cervix-no lesions Bimanual-no cervical motion tenderness and no uterine tenderness; no palpable adnexal masses or tenderness Rectovaginal-normal external exam  PROCEDURE: Gellhorn pessary is removed, cleaned, and not reinserted  ASSESSMENT: 1.  Symptomatic vaginal wall abrasions, possibly related to applicator trauma 2.  Abnormal urinalysis suspicious for UTI 3.  Pelvic organ prolapse (cystocele, rectocele, uterine)  PLAN: 1.  Pessary is removed and not reinserted 2.  Hold on Trimosan gel intravaginal 3.  Begin Premarin cream 1/2 g intravaginal twice a week for 2 weeks; insert the applicator no greater than 2 cm into the vagina when applying the cream 4.  Return in 2 weeks for follow-up and possible pessary reinsertion  A total of 15 minutes were spent face-to-face with the patient during this encounter and over half of that time dealt with counseling and coordination of care.  Brayton Mars, MD  Note: This dictation was prepared with  Dragon dictation along with smaller phrase technology. Any transcriptional errors that result from this process are unintentional.

## 2018-04-06 NOTE — Patient Instructions (Signed)
1.  Pessary is removed today. 2.  Insert Premarin cream intravaginal twice a week 1/2 g using applicator 3.  Return 04/20/2018 for follow-up and probable pessary reinsertion

## 2018-04-08 LAB — URINE CULTURE

## 2018-04-19 ENCOUNTER — Ambulatory Visit (INDEPENDENT_AMBULATORY_CARE_PROVIDER_SITE_OTHER): Payer: Medicare Other | Admitting: Obstetrics and Gynecology

## 2018-04-19 VITALS — BP 88/55 | HR 87 | Wt 122.0 lb

## 2018-04-19 DIAGNOSIS — N816 Rectocele: Secondary | ICD-10-CM

## 2018-04-19 DIAGNOSIS — N8111 Cystocele, midline: Secondary | ICD-10-CM

## 2018-04-19 DIAGNOSIS — S30814A Abrasion of vagina and vulva, initial encounter: Secondary | ICD-10-CM

## 2018-04-19 DIAGNOSIS — N814 Uterovaginal prolapse, unspecified: Secondary | ICD-10-CM

## 2018-04-19 NOTE — Patient Instructions (Signed)
1.  The pessary is left out today. 2.  Continue using the estrogen cream intravaginal twice a week 3.  Return on 05/09/2018 for follow-up and pessary insertion

## 2018-04-19 NOTE — Progress Notes (Signed)
Pt states no concerns  Chief complaint: 1.  Follow-up on vaginal abrasion 2.  Pessary maintenance  Laura Walters presents today for 2-week follow-up.  Her pessary has been left out the past several weeks in order to allow vaginal abrasions to heal.  She has been using the Premarin cream intravaginal twice a week to help with the healing process.  Laura Walters reports no significant vaginal bleeding or vaginal discharge.  She has been functioning reasonably well with the pessary not in place.  Bowel movements have been regular.  Bladder function has been relatively stable.  She is emptying her bladder. She does not report any UTI symptoms in the form of dysuria, frequency, or urgency.  She reports no fever, chills, sweats, or flank pain.  Past Medical History:  Diagnosis Date  . Arthritis   . Asthma   . Back pain   . Cancer of thyroid ([place]) 03/30/2015  . Cardiomyopathy ([place])    idiopathic  . Chest pain    non cardiac  . Chokes    easily   swallow test negative  . Constipation   . Cystocele   . DDD (degenerative disc disease), lumbar   . Disc disorder   . GERD (gastroesophageal reflux disease)   . H/O wheezing    advair as needed  . Hypertension   . Hypothyroidism   . IBS (irritable bowel syndrome)   . Low back pain   . Neuropathy   . Nocturia   . Pelvic pain in female   . RAD (reactive airway disease)   . Rectocele   . Seasonal allergies   . Spinal stenosis   . Urge incontinence   . Vaginal atrophy   . Vaginal polyp   . Vulvitis    Past Surgical History:  Procedure Laterality Date  . BREAST BIOPSY Right 86 and 92   2 bx-neg  . CATARACT EXTRACTION W/PHACO Right 05/19/2016   Procedure: CATARACT EXTRACTION PHACO AND INTRAOCULAR LENS PLACEMENT (IOC);  Surgeon: Estill Cotta, MD;  Location: [facility];  Service: Ophthalmology;  Laterality: Right;  Lot# [id number] [place]:   1:25.3 AP%:  24.9% CDE:  40.11  . CATARACT EXTRACTION W/PHACO Left 08/11/2016   Procedure: CATARACT  EXTRACTION PHACO AND INTRAOCULAR LENS PLACEMENT (IOC);  Surgeon: Estill Cotta, MD;  Location: [facility];  Service: Ophthalmology;  Laterality: Left;  [place] 1.24 AP% 26.1 CDE 37.79 FLUID PACK LOT # [id number]  . DILATION AND CURETTAGE OF UTERUS    . HEMORROIDECTOMY     x 2  . JOINT REPLACEMENT     left hip  . KNEE ARTHROPLASTY Right 01/05/2016   Procedure: COMPUTER ASSISTED TOTAL KNEE ARTHROPLASTY;  Surgeon: Dereck Leep, MD;  Location: [facility];  Service: Orthopedics;  Laterality: Right;  . KNEE ARTHROSCOPY Right 05/19/2015   Procedure: Right knee arthrosocpy medail and lateral menisectomy, chondroplasty ;  Surgeon: Dereck Leep, MD;  Location: [facility];  Service: Orthopedics;  Laterality: Right;  . Left total hip arthroplasty    . PAROTIDECTOMY    . PITUITARY SURGERY    . THYROID SURGERY     bx  . THYROIDECTOMY     OBJECTIVE: BP (!) 88/55   Pulse 87   Wt 122 lb (55.3 kg)   BMI 23.83 kg/m  Pleasant elderly female in no acute distress.  Alert and oriented. Abdomen: Soft, nontender without organomegaly Pelvic exam: External genitalia-normal BUS-normal Vagina-fair estrogen effect; prolapse of anterior and posterior vaginal walls present; vaginal wall abrasions are 80% healed and  not producing any significant drainage; there is no malodor Cervix-no lesions Bimanual-no cervical motion tenderness or uterine tenderness; no adnexal tenderness Rectovaginal-normal external exam  ASSESSMENT: 1.  Vaginal abrasions, 80% healed 2.  Pelvic organ prolapse, minimally symptomatic at present  PLAN: 1.  Leave pessary out for another 2 weeks 2.  Continue with Premarin cream intravaginal twice a week 3.  Return 05/09/2018 for reassessment and probable pessary insertion 4.  If patient becomes very symptomatic prior to 05/09/2018, she may return as needed for earlier pessary insertion.  A total of 15 minutes were spent face-to-face with the patient during this encounter and over half of that  time dealt with counseling and coordination of care.  Brayton Mars, MD  Note: This dictation was prepared with Dragon dictation along with smaller phrase technology. Any transcriptional errors that result from this process are unintentional.

## 2018-05-10 ENCOUNTER — Encounter: Payer: Self-pay | Admitting: Obstetrics and Gynecology

## 2018-05-10 ENCOUNTER — Ambulatory Visit (INDEPENDENT_AMBULATORY_CARE_PROVIDER_SITE_OTHER): Payer: Medicare Other | Admitting: Obstetrics and Gynecology

## 2018-05-10 VITALS — BP 105/68 | HR 80 | Ht 60.0 in | Wt 121.5 lb

## 2018-05-10 DIAGNOSIS — S30814D Abrasion of vagina and vulva, subsequent encounter: Secondary | ICD-10-CM

## 2018-05-10 DIAGNOSIS — N814 Uterovaginal prolapse, unspecified: Secondary | ICD-10-CM | POA: Diagnosis not present

## 2018-05-10 DIAGNOSIS — N8111 Cystocele, midline: Secondary | ICD-10-CM | POA: Diagnosis not present

## 2018-05-10 DIAGNOSIS — N816 Rectocele: Secondary | ICD-10-CM | POA: Diagnosis not present

## 2018-05-10 DIAGNOSIS — W57XXXA Bitten or stung by nonvenomous insect and other nonvenomous arthropods, initial encounter: Secondary | ICD-10-CM

## 2018-05-10 MED ORDER — DOXYCYCLINE HYCLATE 100 MG PO CAPS: 200.0000 mg | ORAL_CAPSULE | Freq: Once | ORAL | 0 refills | Status: AC

## 2018-05-10 NOTE — Patient Instructions (Signed)
1.  Gel horn pessary is inserted today 2.  Prophylaxis for tick bite is given today-doxycycline 200 mg orally for 1 dose 3.  Return in 6 weeks for pessary maintenance

## 2018-05-10 NOTE — Progress Notes (Signed)
Chief complaint: 1.  Vaginal abrasion 2.  Tick bite  Patient presents today for follow-up on vaginal abrasion which was diagnosed recently.  The lesion was not completely healed at last visit on 04/19/2018.  She states that she is not experiencing any vaginal dryness, vaginal odor, vaginal discharge, or pelvic pain.  Bowel and bladder function have been tolerable.  She reports no UTI symptoms.  Today she noted a tick on her lower left shin area while disrobing for her exam.  She remove the tick which was blood engorged.  Past medical history: Past surgical history, problem list, medications, and allergies are reviewed  Review of systems: Complete review of systems is negative with a special highlights to that noted in the HPI  OBJECTIVE: BP 105/68   Pulse 80   Ht 5' (1.524 m)   Wt 121 lb 8 oz (55.1 kg)   BMI 23.73 kg/m  Pleasant frail elderly female in no acute distress.  Affect is appropriate. Abdomen: Soft, nontender without organomegaly Pelvic exam: External genitalia-normal BUS-normal Vagina-fair estrogen effect; prolapse of anterior and posterior vaginal walls unchanged; vaginal abrasions previously noted are healed; no discharge; no malodor Cervix-no lesions Bimanual-no cervical motion tenderness or uterine tenderness; no adnexal tenderness Rectovaginal-normal external exam Extremities: Left shin area notable for skin puncture from previously attached tick; 2 cm of symmetric annular hyperemia around the tick bite.  ASSESSMENT: 1.  Pelvic organ prolapse, currently untreated for the past 6 weeks due to removal and non-reinsertion of pessary due to vaginal abrasion 2.  Complete resolution of vaginal abrasion today 3.  Tick bite with surrounding skin hyperemia  PLAN: 1.  Gel horn pessary is inserted today 2.  Patient is to use Trimosan gel intravaginal weekly 3.  Doxycycline 200 mg orally x1 dose is prescribed for tick bite prophylaxis 4.  Return in 6 weeks for pessary  maintenance  A total of 15 minutes were spent face-to-face with the patient during this encounter and over half of that time dealt with counseling and coordination of care.  Brayton Mars, MD  Note: This dictation was prepared with Dragon dictation along with smaller phrase technology. Any transcriptional errors that result from this process are unintentional.

## 2018-06-15 ENCOUNTER — Encounter: Payer: Self-pay | Admitting: Obstetrics and Gynecology

## 2018-06-15 ENCOUNTER — Ambulatory Visit (INDEPENDENT_AMBULATORY_CARE_PROVIDER_SITE_OTHER): Payer: Medicare Other | Admitting: Obstetrics and Gynecology

## 2018-06-15 VITALS — BP 90/54 | HR 80 | Ht 60.0 in | Wt 119.8 lb

## 2018-06-15 DIAGNOSIS — Z4689 Encounter for fitting and adjustment of other specified devices: Secondary | ICD-10-CM | POA: Diagnosis not present

## 2018-06-15 DIAGNOSIS — N816 Rectocele: Secondary | ICD-10-CM | POA: Diagnosis not present

## 2018-06-15 DIAGNOSIS — N814 Uterovaginal prolapse, unspecified: Secondary | ICD-10-CM

## 2018-06-15 DIAGNOSIS — N8111 Cystocele, midline: Secondary | ICD-10-CM

## 2018-06-15 NOTE — Progress Notes (Signed)
Chief complaint: 1.  Pessary maintenance 2.  Incomplete uterovaginal prolapse 3.  Cystocele 4.  Rectocele 5.  History of vaginal abrasion secondary to pessary use  Patient presents for pessary maintenance-5 weeks status post insertion. Pessary type: Gellhorn Medications: Trimosan gel (discontinued due to irritative symptoms following each insertion); patient reports using Vagisil with success Symptoms: Patient denies vaginal bleeding, vaginal odor, vaginal discharge, pelvic pain; she does report irritation with Trimosan use and has subsequently discontinued this medication.  Past medical history, past surgeries, problem list, medications, and allergies are reviewed  OBJECTIVE: BP (!) 90/54   Pulse 80   Ht 5' (1.524 m)   Wt 119 lb 12.8 oz (54.3 kg)   BMI 23.40 kg/m   Pleasant frail elderly female in no acute distress.  Alert and oriented. Abdomen: Soft, nontender, without organomegaly Pelvic exam: External genitalia-normal BUS-normal Vagina-no significant discharge; no significant erosions Cervix-parous; no erosions or ulcerations seen; no cervical motion tenderness Uterus-midplane, small, mobile, nontender Adnexa-nonpalpable nontender Rectovaginal-normal external exam  PROCEDURE: Gellhorn pessary is removed, cleaned, and reinserted  ASSESSMENT: 1.  Normal pessary maintenance 2.  Pelvic organ prolapse with incomplete uterine prolapse, cystocele, and rectocele, stable with pessary use 3.  History of vaginal erosion secondary to pessary use, asymptomatic at this time 4.  History of recurrent UTI without symptoms today  PLAN: 1.  Pessary is removed, cleaned, and reinserted 2.  Patient is to discontinue Trimosan gel 3.  Patient may use Vagisil as needed 4.  Return in 8 weeks for pessary maintenance  A total of 15 minutes were spent face-to-face with the patient during this encounter and over half of that time dealt with counseling and coordination of care.  Brayton Mars, MD  Note: This dictation was prepared with Dragon dictation along with smaller phrase technology. Any transcriptional errors that result from this process are unintentional.

## 2018-06-15 NOTE — Patient Instructions (Signed)
1.  Return in 8 weeks for pessary maintenance 2.  Discontinue Trimosan gel use 3.  May use Vagisil as needed

## 2018-06-20 ENCOUNTER — Encounter: Payer: Medicare Other | Admitting: Obstetrics and Gynecology

## 2018-06-27 ENCOUNTER — Other Ambulatory Visit
Admission: RE | Admit: 2018-06-27 | Discharge: 2018-06-27 | Disposition: A | Discharge status: Home / Self Care | Payer: Medicare Other | Source: Ambulatory Visit | Attending: Endocrinology | Admitting: Endocrinology

## 2018-06-28 ENCOUNTER — Other Ambulatory Visit
Admission: RE | Admit: 2018-06-28 | Discharge: 2018-06-28 | Disposition: A | Discharge status: Home / Self Care | Payer: Medicare Other | Source: Ambulatory Visit | Attending: Endocrinology | Admitting: Endocrinology

## 2018-06-28 DIAGNOSIS — E890 Postprocedural hypothyroidism: Secondary | ICD-10-CM | POA: Insufficient documentation

## 2018-06-28 DIAGNOSIS — C73 Malignant neoplasm of thyroid gland: Secondary | ICD-10-CM | POA: Diagnosis not present

## 2018-06-28 DIAGNOSIS — D352 Benign neoplasm of pituitary gland: Secondary | ICD-10-CM | POA: Diagnosis present

## 2018-06-28 LAB — CORTISOL: Cortisol, Plasma: 12.1 ug/dL

## 2018-06-29 LAB — THYROID PANEL
FREE THYROXINE INDEX: 2.2 (ref 1.2–4.9)
T3 UPTAKE RATIO: 30 % (ref 24–39)
T4 TOTAL: 7.2 ug/dL (ref 4.5–12.0)

## 2018-06-29 LAB — FOLLICLE STIMULATING HORMONE: FSH: 34.6 m[IU]/mL

## 2018-06-29 LAB — ACTH: C206 ACTH: 17.1 pg/mL (ref 7.2–63.3)

## 2018-07-17 ENCOUNTER — Telehealth: Payer: Self-pay | Admitting: *Deleted

## 2018-07-17 ENCOUNTER — Inpatient Hospital Stay: Payer: Medicare Other

## 2018-07-17 ENCOUNTER — Inpatient Hospital Stay: Payer: Medicare Other | Admitting: Hematology and Oncology

## 2018-07-17 NOTE — Telephone Encounter (Signed)
Patient is aware of the changed date and time of her 07/17/18 R/S to 07/27/18 appts for labs and MD visit.

## 2018-07-27 ENCOUNTER — Inpatient Hospital Stay: Payer: Medicare Other | Attending: Hematology and Oncology

## 2018-07-27 ENCOUNTER — Inpatient Hospital Stay: Payer: Medicare Other | Admitting: Hematology and Oncology

## 2018-07-27 DIAGNOSIS — C778 Secondary and unspecified malignant neoplasm of lymph nodes of multiple regions: Secondary | ICD-10-CM | POA: Diagnosis not present

## 2018-07-27 DIAGNOSIS — Z923 Personal history of irradiation: Secondary | ICD-10-CM | POA: Diagnosis not present

## 2018-07-27 DIAGNOSIS — C7800 Secondary malignant neoplasm of unspecified lung: Secondary | ICD-10-CM | POA: Insufficient documentation

## 2018-07-27 DIAGNOSIS — C73 Malignant neoplasm of thyroid gland: Secondary | ICD-10-CM | POA: Diagnosis not present

## 2018-07-27 LAB — CBC WITH DIFFERENTIAL/PLATELET
Abs Immature Granulocytes: 0.03 10*3/uL (ref 0.00–0.07)
Basophils Absolute: 0 10*3/uL (ref 0.0–0.1)
Basophils Relative: 0 %
Eosinophils Absolute: 0.9 10*3/uL — ABNORMAL HIGH (ref 0.0–0.5)
Eosinophils Relative: 12 %
HCT: 38.6 % (ref 36.0–46.0)
Hemoglobin: 12.2 g/dL (ref 12.0–15.0)
Immature Granulocytes: 0 %
Lymphocytes Relative: 15 %
Lymphs Abs: 1.1 10*3/uL (ref 0.7–4.0)
MCH: 29.7 pg (ref 26.0–34.0)
MCHC: 31.6 g/dL (ref 30.0–36.0)
MCV: 93.9 fL (ref 80.0–100.0)
Monocytes Absolute: 0.9 10*3/uL (ref 0.1–1.0)
Monocytes Relative: 13 %
Neutro Abs: 4.5 10*3/uL (ref 1.7–7.7)
Neutrophils Relative %: 60 %
Platelets: 238 10*3/uL (ref 150–400)
RBC: 4.11 MIL/uL (ref 3.87–5.11)
RDW: 12.9 % (ref 11.5–15.5)
WBC: 7.5 10*3/uL (ref 4.0–10.5)
nRBC: 0 % (ref 0.0–0.2)

## 2018-07-27 LAB — COMPREHENSIVE METABOLIC PANEL
ALT: 17 U/L (ref 0–44)
AST: 28 U/L (ref 15–41)
Albumin: 3.7 g/dL (ref 3.5–5.0)
Alkaline Phosphatase: 111 U/L (ref 38–126)
Anion gap: 8 (ref 5–15)
BUN: 15 mg/dL (ref 8–23)
CO2: 26 mmol/L (ref 22–32)
Calcium: 8.7 mg/dL — ABNORMAL LOW (ref 8.9–10.3)
Chloride: 102 mmol/L (ref 98–111)
Creatinine, Ser: 0.95 mg/dL (ref 0.44–1.00)
GFR calc Af Amer: >60 mL/min (ref >60)
GFR calc non Af Amer: 54 mL/min — ABNORMAL LOW (ref >60)
Glucose, Bld: 103 mg/dL — ABNORMAL HIGH (ref 70–99)
Potassium: 4.5 mmol/L (ref 3.5–5.1)
Sodium: 136 mmol/L (ref 135–145)
Total Bilirubin: 0.5 mg/dL (ref 0.3–1.2)
Total Protein: 6.9 g/dL (ref 6.5–8.1)

## 2018-07-27 LAB — TSH: TSH: 0.032 u[IU]/mL — ABNORMAL LOW (ref 0.350–4.500)

## 2018-07-27 NOTE — Progress Notes (Deleted)
[facility] day:  07/27/2018   Chief Complaint: Laura Walters is a 84 y.o. female with thyroid carcinoma and pituitary tumor who is seen for review of PET scan.  HPI:  The patient was last seen in the medical oncology clinic on 01/19/2018.  At that time, she denied any concerns.  She denied any neurologic symptoms.  Exam was stable.  Labs were unremarkable. PET scan on 01/17/2018 revealed an overall similar appearance of scattered hypermetabolic pulmonary nodules and left paratracheal lymph nodes. Left supraclavicular nodal mass was slightly increased in size (1.7 cm).    We discussed consideration of follow-up chest CT in 6 months. She was scheduled for follow-up head MRI in [place].  During the interim,   Past Medical History:  Diagnosis Date  . Arthritis   . Asthma   . Back pain   . Cancer of thyroid ([place]) 03/30/2015  . Cardiomyopathy ([place])    idiopathic  . Chest pain    non cardiac  . Chokes    easily   swallow test negative  . Constipation   . DDD (degenerative disc disease), lumbar   . Disc disorder   . GERD (gastroesophageal reflux disease)   . H/O wheezing    advair as needed  . Hypertension   . Hypothyroidism   . IBS (irritable bowel syndrome)   . Neuropathy   . Nocturia   . RAD (reactive airway disease)   . Rectocele   . Seasonal allergies   . Spinal stenosis   . Urge incontinence   . Vaginal atrophy   . Vaginal polyp     Past Surgical History:  Procedure Laterality Date  . BREAST BIOPSY Right 86 and 92   2 bx-neg  . CATARACT EXTRACTION W/PHACO Right 05/19/2016   Procedure: CATARACT EXTRACTION PHACO AND INTRAOCULAR LENS PLACEMENT (IOC);  Surgeon: Estill Cotta, MD;  Location: [facility] ORS;  Service: Ophthalmology;  Laterality: Right;  Lot# [id number] [place]:   1:25.3 AP%:  24.9% CDE:  40.11  . CATARACT EXTRACTION W/PHACO Left 08/11/2016   Procedure: CATARACT EXTRACTION PHACO AND INTRAOCULAR LENS PLACEMENT (IOC);   Surgeon: Estill Cotta, MD;  Location: [facility] ORS;  Service: Ophthalmology;  Laterality: Left;  [place] 1.24 AP% 26.1 CDE 37.79 FLUID PACK LOT # [id number]  . DILATION AND CURETTAGE OF UTERUS    . HEMORROIDECTOMY     x 2  . JOINT REPLACEMENT     left hip  . KNEE ARTHROPLASTY Right 01/05/2016   Procedure: COMPUTER ASSISTED TOTAL KNEE ARTHROPLASTY;  Surgeon: Dereck Leep, MD;  Location: [facility] ORS;  Service: Orthopedics;  Laterality: Right;  . KNEE ARTHROSCOPY Right 05/19/2015   Procedure: Right knee arthrosocpy medail and lateral menisectomy, chondroplasty ;  Surgeon: Dereck Leep, MD;  Location: [facility] ORS;  Service: Orthopedics;  Laterality: Right;  . Left total hip arthroplasty    . PAROTIDECTOMY    . PITUITARY SURGERY    . THYROID SURGERY     bx  . THYROIDECTOMY      Family History  Problem Relation Age of Onset  . Heart disease Father   . Diabetes Paternal Uncle   . Cancer Neg Hx   . Breast cancer Neg Hx     Social History:  reports that she has never smoked. She has never used smokeless tobacco. She reports that she does not drink alcohol or use drugs.  The patient lives alone in [place]. She has a son named [name], and a  daughter named [name].  The patient is alone today.  Allergies:  Allergies  Allergen Reactions  . Aleve [Naproxen]   . Codeine Hives and Itching  . Penicillins Hives    Has patient had a PCN reaction causing immediate rash, facial/tongue/throat swelling, SOB or lightheadedness with hypotension: hives Has patient had a PCN reaction causing severe rash involving mucus membranes or skin necrosis: no Has patient had a PCN reaction that required hospitalization no Has patient had a PCN reaction occurring within the last 10 years: no If all of the above answers are "NO", then may proceed with Cephalosporin use.  . Sulfa Antibiotics Hives    Current Medications: Current Outpatient Medications  Medication Sig Dispense Refill  . diphenhydrAMINE (BENADRYL) 25 MG  tablet Take 25 mg by mouth every 6 (six) hours as needed.    . docusate sodium (COLACE) 100 MG capsule Take by mouth.    . DULoxetine (CYMBALTA) 60 MG capsule Take 60 mg by mouth daily.     . ferrous sulfate 325 (65 FE) MG tablet Take 325 mg by mouth 2 (two) times daily with a meal.     . Fluticasone-Salmeterol (ADVAIR DISKUS) 250-50 MCG/DOSE AEPB Inhale 1 puff into the lungs 2 (two) times daily.    . furosemide (LASIX) 20 MG tablet TAKE 1 TABLET BY MOUTH TWICE DAILY.    Marland Kitchen gabapentin (NEURONTIN) 400 MG capsule Take 800 mg by mouth 3 (three) times daily.     Marland Kitchen levothyroxine (SYNTHROID, LEVOTHROID) 88 MCG tablet     . MAGNESIUM PO Take by mouth.    . Multiple Vitamins-Minerals (PRESERVISION AREDS 2) CAPS Take 1 capsule by mouth 2 (two) times daily.    . nitrofurantoin, macrocrystal-monohydrate, (MACROBID) 100 MG capsule Take 100 mg by mouth 2 (two) times daily.    Marland Kitchen nystatin (MYCOSTATIN/NYSTOP) powder Apply topically 4 (four) times daily. 15 g 0  . omeprazole (PRILOSEC) 40 MG capsule Take 40 mg by mouth daily.     Levin Erp SULFATE VAGINAL 0.025 % GEL Place 0.025 g vaginally once a week. 113.4 g 3  . potassium chloride (K-DUR,KLOR-CON) 10 MEQ tablet Take 10 mEq by mouth daily.     . ranitidine (ZANTAC) 150 MG tablet Take 150 mg by mouth daily.    . traMADol (ULTRAM) 50 MG tablet Take 50 mg by mouth every 6 (six) hours as needed.     No current facility-administered medications for this visit.     Review of Systems:  GENERAL:  Feels "the same".  No fevers, sweats or weight loss. Always hot.  Weight up 5 pounds since last visit. PERFORMANCE STATUS (ECOG):  1 HEENT:  Dry mouth.  No visual changes, runny nose, sore throat, mouth sores or tenderness. Lungs: No shortness of breath or cough.  No hemoptysis. Cardiac:  No chest pain, palpitations, orthopnea, or PND. GI:  No nausea, vomiting, diarrhea, constipation, melena or hematochezia. GU:  No urgency, frequency, dysuria, or  hematuria. Musculoskeletal:  Left heel pain in AM.  Chronic back pain secondary to stenosis.  Arthritis.  No muscle tenderness. Extremities:  No pain or swelling. Skin:  No rashes or skin changes. Neuro:  No headache, numbness or weakness, balance or coordination issues. Endocrine:  No diabetes.  On Synthroid s/p thyroidectomy.  No hot flashes or night sweats. Psych:  No mood changes, depression or anxiety. Pain:  Chronic back pain. Review of systems:  All other systems reviewed and found to be negative.   Physical Exam: There were no  vitals taken for this visit. GENERAL:  Well developed, well nourished, elderly woman sitting comfortably in the exam room in no acute distress.  She has a cane at her side. MENTAL STATUS:  Alert and oriented to person, place and time. HEAD:  Curly white hair.  Normocephalic, atraumatic, face symmetric, no Cushingoid features. EYES:  Glasses.  Blue eyes.  No conjunctivitis or scleral icterus. NEUROLOGICAL: Unremarkable. PSYCH:  Appropriate.    No visits with results within 3 Day(s) from this visit.  Latest known visit with results is:  Hospital Outpatient Visit on 06/28/2018  Component Date Value Ref Range Status  . T4, Total 06/28/2018 7.2  4.5 - 12.0 ug/dL Final  . T3 Uptake Ratio 06/28/2018 30  24 - 39 % Final  . Free Thyroxine Index 06/28/2018 2.2  1.2 - 4.9 Final   Comment: (NOTE) Performed At: [facility] [place], [name] [id number] Rush Farmer MD GU:[id number]   . [facility] 06/28/2018 34.6  mIU/mL Final   Comment: (NOTE)                    Adult Female:                      Follicular phase      3.5 -  12.5                      Ovulation phase       4.7 -  21.5                      Luteal phase          1.7 -   7.7                      Postmenopausal       25.8 - 134.8 Performed At: [facility] [place], [name] [id number] Rush Farmer MD PI:[id number]   . Cortisol, Plasma 06/28/2018 12.1   ug/dL Final   Comment: (NOTE) AM    6.7 - 22.6 ug/dL PM   <10.0       ug/dL Performed at [facility], [place] [address] [place], [place] [address]   . C206 ACTH 06/28/2018 17.1  7.2 - 63.3 pg/mL Final   Comment: (NOTE) ACTH reference interval for samples collected between 7 and 10 AM. Performed At: [facility] [place], [name] [id number] Rush Farmer MD DD:[id number]     Assessment:  Laura Walters is a 84 y.o. female with a recurrent thyroid cancer.  She was diagnosed with thyroid carcinoma in 08/2002.  She underwent thyroidectomy and lymph node dissection.  She received I-131 in 10/2002.    Her thyroid cancer recurred in 2012. CT scans in 01/2011 revealed lung nodules.  She received I-131 in 08/2011.  Follow-up scans revealed stable disease.  PET scan on 01/01/2016 revealed progressive disease, as evidenced by increased size and hypermetabolism of cervical nodes, thoracic nodes, and pulmonary nodules.  There was a foci of osseous hypermetabolism (indeterminate). Right posterior ninth rib and transverse process hypermetabolism could be posttraumatic, given suggestion of nondisplaced rib fracture.  I-131 scan on 02/16/2016 revealed no evidence of I-131 avid thyroid carcinoma.  Ultrasound guided biopsy of the left supraclavicular lymph node on 03/24/2016 revealed metastatic papillary thyroid carcinoma.  Dr Hanley Ben adjusts her Synthroid.  Chest CT on 07/02/2016 revealed stable metastatic disease to the lungs,  with minimal enlargement of some pulmonary nodules, but no new pulmonary nodules. There was a new healing fracture of the anterior aspect of the left fifth rib.   PET scan on 12/31/2016 revealed generally similar appearance, with several abnormal hypermetabolic lymph nodes along the thoracic inlet level, and scattered hypermetabolic pulmonary nodules favoring the lung bases. The overall tumor burden has not significantly changed.  There was a 3.1 x 1.7 cm  mass arising from the left side of the sella turcica intracranially which probably has some low-grade metabolic activity.   Chest CT on 07/04/2017 revealed a mixed response to therapy, with similar number of numerous pulmonary nodules throughout the lungs bilaterally, but with some of these nodules demonstrating slight growth (2 mm) and yet other nodules demonstrating slight regression.  PET scan on 01/17/2018 revealed overall similar appearance of scattered hypermetabolic pulmonary nodules and left paratracheal lymph nodes. Left supraclavicular nodal mass was slightly increased in size (1.7 cm) in the interval.  The sellar and left cavernous sinus mass with suprasellar extension was similar to previous exam (2.6 cm; previously 3.1 cm) compatible with previously characterized recurrence of patient's pituitary tumor.   She received 5 cranial radiation treatments in 03/2017 in [place].  Head MRI during the interim revealed "no growth" per patient (no report available).  Chest CT on 07/04/2017 revealed a mixed response to therapy, with similar number of numerous pulmonary nodules throughout the lungs bilaterally, but with some of these nodules demonstrating slight growth (2 mm) and yet other nodules demonstrating slight regression.  Symptomatically,  she denies any concerns.  She denies any neurologic symptoms.  Exam is stable.  Labs are unremarkable.   Plan: 1.  Labs today:  CBC with diff, CMP, TSH, free T4. 2.  Thyroid cancer:  3.  Pituitary tumor:   Review interval PET scan- recurrent thyroid cancer is relatively stable except for left supraclavicular nodal mass (1.7 cm).  Consider repeat chest CT in 6 months.  Discuss continued monitoring.  Anticipate follow-up PET scan in 1 year. 2.  Discuss pituitary tumor.  Follow-up head MRI in [place]. 3.  RTC in 6 months for MD assessment and labs (CBC with diff, CMP, TSH, free T4).   Lequita Asal, MD  07/27/2018, 4:46 AM  I saw and  evaluated the patient, participating in the key portions of the service and reviewing pertinent diagnostic studies and records.  I reviewed the nurse practitioner's note and agree with the findings and the plan.  The assessment and plan were discussed with the patient.  Additional diagnostic studies of *** are needed to clarify *** and would change the clinical management.  A few ***multiple questions were asked by the patient and answered.   Nolon Stalls, MD 07/27/2018,4:46 AM

## 2018-07-28 LAB — T4: T4, Total: 8.6 ug/dL (ref 4.5–12.0)

## 2018-08-01 ENCOUNTER — Telehealth: Payer: Self-pay | Admitting: Obstetrics and Gynecology

## 2018-08-01 NOTE — Telephone Encounter (Signed)
The patient states she missed the call back yesterday and she "really, really needs to speak to someone" today, please advise, thanks.

## 2018-08-01 NOTE — Telephone Encounter (Signed)
Pt noticed blood x 2 on her pd and after urination. NO uti sx or pain with BM. Appt made for tomorrow at 1:15.

## 2018-08-02 ENCOUNTER — Ambulatory Visit (INDEPENDENT_AMBULATORY_CARE_PROVIDER_SITE_OTHER): Payer: Medicare Other | Admitting: Obstetrics and Gynecology

## 2018-08-02 ENCOUNTER — Encounter: Payer: Self-pay | Admitting: Hematology and Oncology

## 2018-08-02 ENCOUNTER — Inpatient Hospital Stay: Payer: Medicare Other | Attending: Hematology and Oncology | Admitting: Hematology and Oncology

## 2018-08-02 ENCOUNTER — Encounter: Payer: Self-pay | Admitting: Obstetrics and Gynecology

## 2018-08-02 VITALS — BP 128/74 | HR 70 | Temp 95.4°F | Resp 18 | Wt 119.7 lb

## 2018-08-02 VITALS — BP 130/71 | HR 73 | Ht 60.0 in | Wt 119.5 lb

## 2018-08-02 DIAGNOSIS — C73 Malignant neoplasm of thyroid gland: Secondary | ICD-10-CM

## 2018-08-02 DIAGNOSIS — D497 Neoplasm of unspecified behavior of endocrine glands and other parts of nervous system: Secondary | ICD-10-CM

## 2018-08-02 DIAGNOSIS — Z79899 Other long term (current) drug therapy: Secondary | ICD-10-CM | POA: Diagnosis not present

## 2018-08-02 DIAGNOSIS — G629 Polyneuropathy, unspecified: Secondary | ICD-10-CM | POA: Insufficient documentation

## 2018-08-02 DIAGNOSIS — C778 Secondary and unspecified malignant neoplasm of lymph nodes of multiple regions: Secondary | ICD-10-CM | POA: Diagnosis not present

## 2018-08-02 DIAGNOSIS — N939 Abnormal uterine and vaginal bleeding, unspecified: Secondary | ICD-10-CM | POA: Diagnosis not present

## 2018-08-02 DIAGNOSIS — E039 Hypothyroidism, unspecified: Secondary | ICD-10-CM | POA: Diagnosis not present

## 2018-08-02 DIAGNOSIS — N8111 Cystocele, midline: Secondary | ICD-10-CM | POA: Diagnosis not present

## 2018-08-02 DIAGNOSIS — C7800 Secondary malignant neoplasm of unspecified lung: Secondary | ICD-10-CM | POA: Insufficient documentation

## 2018-08-02 DIAGNOSIS — Z85850 Personal history of malignant neoplasm of thyroid: Secondary | ICD-10-CM | POA: Diagnosis not present

## 2018-08-02 DIAGNOSIS — I10 Essential (primary) hypertension: Secondary | ICD-10-CM | POA: Diagnosis not present

## 2018-08-02 DIAGNOSIS — N816 Rectocele: Secondary | ICD-10-CM | POA: Diagnosis not present

## 2018-08-02 DIAGNOSIS — S30814A Abrasion of vagina and vulva, initial encounter: Secondary | ICD-10-CM | POA: Diagnosis not present

## 2018-08-02 DIAGNOSIS — D352 Benign neoplasm of pituitary gland: Secondary | ICD-10-CM | POA: Diagnosis not present

## 2018-08-02 DIAGNOSIS — Z923 Personal history of irradiation: Secondary | ICD-10-CM | POA: Diagnosis not present

## 2018-08-02 NOTE — Patient Instructions (Signed)
1.  Pessary is removed today.  The pessary is not reinserted because of a vaginal abrasion. 2.  Recommend using Premarin cream 1/2 g intravaginal every other day for the next 2 weeks 3.  Return in 2 weeks for recheck and probable insertion of pessary

## 2018-08-02 NOTE — Progress Notes (Signed)
Patient seeing GYN this afternoon for vaginal bleeding.  She does have an area on her left neck that she would like checked today.  She saw PA @ Dr. Bethanne Ginger office who looked at It and thought it was a lymph node.

## 2018-08-02 NOTE — Progress Notes (Signed)
Chief complaint: 1.  Vaginal spotting 2.  Pessary check 3.  Incomplete uterovaginal prolapse, cystocele, rectocele  Last visit (06/15/2018) Pessary type: Gellhorn Medications: Trimosan gel (discontinued due to vaginal irritation) Patient reports vaginal spotting.  Was previously scheduled for 8-week pessary check but has come in early because of the symptoms.  Vaginal bleeding occurring approximately 7 days ago.  She is not actively bleeding at this time.  She denies any significant pelvic pain or pelvic pressure.  She is not using the Trimosan gel intravaginal because of vaginal irritation when she uses the medication. Patient was treated for UTI by Dr. Doy Hutching recently.  Past medical history, past surgeries, problem list, medications, and allergies are reviewed  Review of Systems  Constitutional: Negative for chills, diaphoresis and fever.  Gastrointestinal: Negative for abdominal pain, constipation, nausea and vomiting.  Genitourinary: Negative for dysuria, frequency and urgency.  Skin: Negative for rash.  All other systems reviewed and are negative.  OBJECTIVE: BP 130/71   Pulse 73   Ht 5' (1.524 m)   Wt 119 lb 8 oz (54.2 kg)   BMI 23.34 kg/m  Pleasant frail appearing female in no acute distress.  Alert and oriented.  Affect is appropriate. Abdomen: Soft, nontender without organomegaly Pelvic exam: External genitalia-normal BUS-normal Vagina-posterior proximal vaginal wall and left lateral sidewall abrasion with friability identified without active bleeding.  No significant discharge Cervix-no lesions; nonbleeding Bimanual-deferred Rectovaginal-normal external exam  PROCEDURE: Pessary removal Gel horn pessary is removed in standard fashion using [place] forceps and flexion/collapse of pessary; pessary is cleaned; pessary is not reinserted  ASSESSMENT: 1.  Vaginal bleeding secondary to vaginal abrasion 2.  Not currently using Trimosan gel intravaginal eyes lubricant and  vaginitis suppressant.  PLAN: 1.  Pessary is removed and is not reinserted. 2.  Premarin cream 1/2 g intravaginal every other day for 2 weeks is prescribed 3.  Return in 2 weeks for reassessment and probable pessary reinsertion  Brayton Mars, MD  Note: This dictation was prepared with Dragon dictation along with smaller phrase technology. Any transcriptional errors that result from this process are unintentional.

## 2018-08-02 NOTE — Progress Notes (Signed)
[facility] day:  08/02/2018  Chief Complaint: Laura Walters is a 84 y.o. female with thyroid carcinoma and pituitary tumor who is seen for 6 month assessment.  HPI:  The patient was last seen in the medical oncology clinic on 01/19/2018.  At that time, she denied any concerns.  She denied any neurologic symptoms.  Exam was stable.  Labs were unremarkable. PET scan on 01/17/2018 revealed an overall similar appearance of scattered hypermetabolic pulmonary nodules and left paratracheal lymph nodes. Left supraclavicular nodal mass was slightly increased in size (1.7 cm).    We discussed consideration of follow-up chest CT in 6 months. She was scheduled for follow-up head MRI in [place].  During the interim, she noticed a lump on the left side of her neck in October.  She states that she saw Dr. Ubaldo Glassing.  The lump was felt.  She states that her pituitary was smaller on imaging.  She saw Dr. Violeta Gelinas in October for management of her Synthroid.  She is currently taking Synthroid 88 mcg a day except for 44 mcg 1 day/week.  She states that she is doing "fair".  She works very hard then rests.  She does a lot of yard work.  She sees GYN.  She wears a pessary.  She notes some vaginal bleeding.  She is seeing GYN today.   Past Medical History:  Diagnosis Date  . Arthritis   . Asthma   . Back pain   . Cancer of thyroid ([place]) 03/30/2015  . Cardiomyopathy ([place])    idiopathic  . Chest pain    non cardiac  . Chokes    easily   swallow test negative  . Constipation   . DDD (degenerative disc disease), lumbar   . Disc disorder   . GERD (gastroesophageal reflux disease)   . H/O wheezing    advair as needed  . Hypertension   . Hypothyroidism   . IBS (irritable bowel syndrome)   . Neuropathy   . Nocturia   . RAD (reactive airway disease)   . Rectocele   . Seasonal allergies   . Spinal stenosis   . Urge incontinence   . Vaginal atrophy   . Vaginal  polyp     Past Surgical History:  Procedure Laterality Date  . BREAST BIOPSY Right 86 and 92   2 bx-neg  . CATARACT EXTRACTION W/PHACO Right 05/19/2016   Procedure: CATARACT EXTRACTION PHACO AND INTRAOCULAR LENS PLACEMENT (IOC);  Surgeon: Estill Cotta, MD;  Location: [facility] ORS;  Service: Ophthalmology;  Laterality: Right;  Lot# [id number] [place]:   1:25.3 AP%:  24.9% CDE:  40.11  . CATARACT EXTRACTION W/PHACO Left 08/11/2016   Procedure: CATARACT EXTRACTION PHACO AND INTRAOCULAR LENS PLACEMENT (IOC);  Surgeon: Estill Cotta, MD;  Location: [facility] ORS;  Service: Ophthalmology;  Laterality: Left;  [place] 1.24 AP% 26.1 CDE 37.79 FLUID PACK LOT # [id number]  . DILATION AND CURETTAGE OF UTERUS    . HEMORROIDECTOMY     x 2  . JOINT REPLACEMENT     left hip  . KNEE ARTHROPLASTY Right 01/05/2016   Procedure: COMPUTER ASSISTED TOTAL KNEE ARTHROPLASTY;  Surgeon: Dereck Leep, MD;  Location: [facility] ORS;  Service: Orthopedics;  Laterality: Right;  . KNEE ARTHROSCOPY Right 05/19/2015   Procedure: Right knee arthrosocpy medail and lateral menisectomy, chondroplasty ;  Surgeon: Dereck Leep, MD;  Location: [facility] OR[facility];  Service: Orthopedics;  Laterality: Right;  . Left total hip arthroplasty    .  PAROTIDECTOMY    . PITUITARY SURGERY    . THYROID SURGERY     bx  . THYROIDECTOMY      Family History  Problem Relation Age of Onset  . Heart disease Father   . Diabetes Paternal Uncle   . Cancer Neg Hx   . Breast cancer Neg Hx     Social History:  reports that she has never smoked. She has never used smokeless tobacco. She reports that she does not drink alcohol or use drugs.  The patient lives alone in [place]. She has a son named [name], and a daughter named [name].  The patient is alone today.  Allergies:  Allergies  Allergen Reactions  . Acetaminophen-Codeine Other (See Comments)  . Aleve [Naproxen]   . Codeine Hives and Itching  . Penicillins Hives    Has patient had a PCN reaction causing  immediate rash, facial/tongue/throat swelling, SOB or lightheadedness with hypotension: hives Has patient had a PCN reaction causing severe rash involving mucus membranes or skin necrosis: no Has patient had a PCN reaction that required hospitalization no Has patient had a PCN reaction occurring within the last 10 years: no If all of the above answers are "NO", then may proceed with Cephalosporin use.  . Sulfa Antibiotics Hives    Current Medications: Current Outpatient Medications  Medication Sig Dispense Refill  . diphenhydrAMINE (BENADRYL) 25 MG tablet Take 25 mg by mouth every 6 (six) hours as needed.    . docusate sodium (COLACE) 100 MG capsule Take by mouth.    . DULoxetine (CYMBALTA) 60 MG capsule Take 60 mg by mouth daily.     . ferrous sulfate 325 (65 FE) MG tablet Take 325 mg by mouth 2 (two) times daily with a meal.     . furosemide (LASIX) 20 MG tablet TAKE 1 TABLET BY MOUTH TWICE DAILY.    Marland Kitchen gabapentin (NEURONTIN) 400 MG capsule Take 800 mg by mouth 3 (three) times daily.     Marland Kitchen levothyroxine (SYNTHROID, LEVOTHROID) 88 MCG tablet     . MAGNESIUM PO Take by mouth.    . Multiple Vitamins-Minerals (PRESERVISION AREDS 2) CAPS Take 1 capsule by mouth 2 (two) times daily.    . potassium chloride (K-DUR,KLOR-CON) 10 MEQ tablet Take 10 mEq by mouth daily.     . ranitidine (ZANTAC) 150 MG tablet Take 150 mg by mouth daily.    . Fluticasone-Salmeterol (ADVAIR DISKUS) 250-50 MCG/DOSE AEPB Inhale 1 puff into the lungs 2 (two) times daily.    Marland Kitchen omeprazole (PRILOSEC) 40 MG capsule Take 40 mg by mouth daily.     . traMADol (ULTRAM) 50 MG tablet Take 50 mg by mouth every 6 (six) hours as needed.     No current facility-administered medications for this visit.     Review of Systems:  GENERAL:  Feels "fair".  Active.  No fevers, sweats or weight loss.  Weight stable. PERFORMANCE STATUS (ECOG):  1 HEENT:  No visual changes, runny nose, sore throat, mouth sores or tenderness. Lungs: No  shortness of breath or cough.  No hemoptysis. Cardiac:  No chest pain, palpitations, orthopnea, or PND. GI:  No nausea, vomiting, diarrhea, constipation, melena or hematochezia. GU:  Vaginal bleeding (seeing GYN today).  No urgency, frequency, dysuria, or hematuria. Musculoskeletal: Chronic back pain secondary to stenosis.  Arthritis. o muscle tenderness. Extremities:  No pain or swelling. Skin:  No rashes or skin changes. Neuro:  No headache, numbness or weakness, balance or coordination issues. Endocrine:  No diabetes.  On synthroid s/p thyroidectomy.  No hot flashes or night sweats. Psych:  No mood changes, depression or anxiety. Pain:  Chronic back pain. Review of systems:  All other systems reviewed and found to be negative.   Physical Exam: Blood pressure 128/74, pulse 70, temperature (!) 95.4 F (35.2 C), temperature source Tympanic, resp. rate 18, weight 119 lb 11.4 oz (54.3 kg). GENERAL:  Well developed, well nourished, elderly woman sitting comfortably in the exam room in no acute distress.  She has a cane at her side. MENTAL STATUS:  Alert and oriented to person, place and time. HEAD:  Curly white hair.  Normocephalic, atraumatic, face symmetric, no Cushingoid features. EYES:  Blue eyes.  Pupils equal round and reactive to light and accomodation.  No conjunctivitis or scleral icterus. ENT:  Oropharynx clear without lesion.  Tongue normal. Mucous membranes moist.  RESPIRATORY:  Clear to auscultation without rales, wheezes or rhonchi. CARDIOVASCULAR:  Regular rate and rhythm without murmur, rub or gallop. ABDOMEN:  Soft, non-tender, with active bowel sounds, and no hepatosplenomegaly.  No masses. SKIN:  No rashes, ulcers or lesions. EXTREMITIES: No edema, no skin discoloration or tenderness.  No palpable cords. LYMPH NODES: No palpable cervical supraclavicular, axillary or inguinal adenopathy  NEUROLOGICAL: Unremarkable. PSYCH:  Appropriate.    No visits with results within 3  Day(s) from this visit.  Latest known visit with results is:  Appointment on 07/27/2018  Component Date Value Ref Range Status  . Sodium 07/27/2018 136  135 - 145 mmol/L Final  . Potassium 07/27/2018 4.5  3.5 - 5.1 mmol/L Final  . Chloride 07/27/2018 102  98 - 111 mmol/L Final  . CO2 07/27/2018 26  22 - 32 mmol/L Final  . Glucose, Bld 07/27/2018 103* 70 - 99 mg/dL Final  . BUN 07/27/2018 15  8 - 23 mg/dL Final  . Creatinine, Ser 07/27/2018 0.95  0.44 - 1.00 mg/dL Final  . Calcium 07/27/2018 8.7* 8.9 - 10.3 mg/dL Final  . Total Protein 07/27/2018 6.9  6.5 - 8.1 g/dL Final  . Albumin 07/27/2018 3.7  3.5 - 5.0 g/dL Final  . AST 07/27/2018 28  15 - 41 U/L Final  . ALT 07/27/2018 17  0 - 44 U/L Final  . Alkaline Phosphatase 07/27/2018 111  38 - 126 U/L Final  . Total Bilirubin 07/27/2018 0.5  0.3 - 1.2 mg/dL Final  . GFR calc non Af Amer 07/27/2018 54* >60 mL/min Final  . GFR calc Af Amer 07/27/2018 >60  >60 mL/min Final   Comment: (NOTE) The eGFR has been calculated using the CKD EPI equation. This calculation has not been validated in all clinical situations. eGFR's persistently <60 mL/min signify possible Chronic Kidney Disease.   Georgiann Hahn gap 07/27/2018 8  5 - 15 Final   Performed at [facility], [address] [place], [place] [address]  . WBC 07/27/2018 7.5  4.0 - 10.5 K/uL Final  . RBC 07/27/2018 4.11  3.87 - 5.11 MIL/uL Final  . Hemoglobin 07/27/2018 12.2  12.0 - 15.0 g/dL Final  . HCT 07/27/2018 38.6  36.0 - 46.0 % Final  . MCV 07/27/2018 93.9  80.0 - 100.0 fL Final  . MCH 07/27/2018 29.7  26.0 - 34.0 pg Final  . MCHC 07/27/2018 31.6  30.0 - 36.0 g/dL Final  . RDW 07/27/2018 12.9  11.5 - 15.5 % Final  . Platelets 07/27/2018 238  150 - 400 K/uL Final  . nRBC 07/27/2018 0.0  0.0 -  0.2 % Final  . Neutrophils Relative % 07/27/2018 60  % Final  . Neutro Abs 07/27/2018 4.5  1.7 - 7.7 K/uL Final  . Lymphocytes Relative 07/27/2018 15  % Final  . Lymphs Abs 07/27/2018 1.1   0.7 - 4.0 K/uL Final  . Monocytes Relative 07/27/2018 13  % Final  . Monocytes Absolute 07/27/2018 0.9  0.1 - 1.0 K/uL Final  . Eosinophils Relative 07/27/2018 12  % Final  . Eosinophils Absolute 07/27/2018 0.9* 0.0 - 0.5 K/uL Final  . Basophils Relative 07/27/2018 0  % Final  . Basophils Absolute 07/27/2018 0.0  0.0 - 0.1 K/uL Final  . Immature Granulocytes 07/27/2018 0  % Final  . Abs Immature Granulocytes 07/27/2018 0.03  0.00 - 0.07 K/uL Final   Performed at [facility], [address] [place], [place] [address]  . TSH 07/27/2018 0.032* 0.350 - 4.500 uIU/mL Final   Comment: Performed by a 3rd Generation assay with a functional sensitivity of <=0.01 uIU/mL. Performed at [facility], [address], [place], [place] [address]   . T4, Total 07/27/2018 8.6  4.5 - 12.0 ug/dL Final   Comment: (NOTE) Performed At: [facility] [place], [name] [id number] Rush Farmer MD RF:[id number]     Assessment:  Laura Walters is a 84 y.o. female with a recurrent thyroid cancer.  She was diagnosed with thyroid carcinoma in 08/2002.  She underwent thyroidectomy and lymph node dissection.  She received I-131 in 10/2002.    Her thyroid cancer recurred in 2012. CT scans in 01/2011 revealed lung nodules.  She received I-131 in 08/2011.  Follow-up scans revealed stable disease.  PET scan on 01/01/2016 revealed progressive disease, as evidenced by increased size and hypermetabolism of cervical nodes, thoracic nodes, and pulmonary nodules.  There was a foci of osseous hypermetabolism (indeterminate). Right posterior ninth rib and transverse process hypermetabolism could be posttraumatic, given suggestion of nondisplaced rib fracture.  I-131 scan on 02/16/2016 revealed no evidence of I-131 avid thyroid carcinoma.  Ultrasound guided biopsy of the left supraclavicular lymph node on 03/24/2016 revealed metastatic papillary thyroid carcinoma.  Dr Hanley Ben adjusts her  Synthroid.  Chest CT on 07/02/2016 revealed stable metastatic disease to the lungs, with minimal enlargement of some pulmonary nodules, but no new pulmonary nodules. There was a new healing fracture of the anterior aspect of the left fifth rib.   PET scan on 12/31/2016 revealed generally similar appearance, with several abnormal hypermetabolic lymph nodes along the thoracic inlet level, and scattered hypermetabolic pulmonary nodules favoring the lung bases. The overall tumor burden has not significantly changed.  There was a 3.1 x 1.7 cm mass arising from the left side of the sella turcica intracranially which probably has some low-grade metabolic activity.   Chest CT on 07/04/2017 revealed a mixed response to therapy, with similar number of numerous pulmonary nodules throughout the lungs bilaterally, but with some of these nodules demonstrating slight growth (2 mm) and yet other nodules demonstrating slight regression.  PET scan on 01/17/2018 revealed overall similar appearance of scattered hypermetabolic pulmonary nodules and left paratracheal lymph nodes. Left supraclavicular nodal mass was slightly increased in size (1.7 cm) in the interval.  The sellar and left cavernous sinus mass with suprasellar extension was similar to previous exam (2.6 cm; previously 3.1 cm) compatible with previously characterized recurrence of patient's pituitary tumor.   She received 5 cranial radiation treatments in 03/2017 in [place].  Head MRI during the interim revealed "no growth" per  patient (no report available).  Chest CT on 07/04/2017 revealed a mixed response to therapy, with similar number of numerous pulmonary nodules throughout the lungs bilaterally, but with some of these nodules demonstrating slight growth (2 mm) and yet other nodules demonstrating slight regression.  Symptomatically, she feels "fair".  She notes some vaginal bleeding.  She denies any neurologic symptoms.  Exam is stable.   Plan: 1.   Labs today:  CBC with diff, CMP, TSH, free T4. 2.  Thyroid cancer:  Concern for increased adenopathy.  Schedule PET scan. 3.  Pituitary tumor:  Per patient report, follow-up head MRI is improved.  Continue imaging in [place]. 4.  Vaginal bleeding:  GYN appointment today. 5.  Schedule PET scan on 08/07/2018. 6.  RTC after PET scan for MD assessment, review of imaging, and discussion regarding direction of therapy.   Lequita Asal, MD  08/02/2018, 4:35 PM

## 2018-08-07 ENCOUNTER — Ambulatory Visit
Admission: RE | Admit: 2018-08-07 | Discharge: 2018-08-07 | Disposition: A | Discharge status: Home / Self Care | Payer: Medicare Other | Source: Ambulatory Visit | Attending: Hematology and Oncology | Admitting: Hematology and Oncology

## 2018-08-07 DIAGNOSIS — C73 Malignant neoplasm of thyroid gland: Secondary | ICD-10-CM

## 2018-08-07 DIAGNOSIS — R918 Other nonspecific abnormal finding of lung field: Secondary | ICD-10-CM | POA: Diagnosis not present

## 2018-08-07 DIAGNOSIS — C7800 Secondary malignant neoplasm of unspecified lung: Secondary | ICD-10-CM | POA: Diagnosis not present

## 2018-08-07 LAB — GLUCOSE, CAPILLARY: Glucose-Capillary: 90 mg/dL (ref 70–99)

## 2018-08-07 MED ORDER — FLUDEOXYGLUCOSE F - 18 (FDG) INJECTION
6.6300 | Freq: Once | INTRAVENOUS | Status: AC | PRN
  Administered 2018-08-07: 6.63 via INTRAVENOUS

## 2018-08-09 ENCOUNTER — Encounter: Payer: Self-pay | Admitting: Hematology and Oncology

## 2018-08-09 ENCOUNTER — Inpatient Hospital Stay (HOSPITAL_BASED_OUTPATIENT_CLINIC_OR_DEPARTMENT_OTHER): Payer: Medicare Other | Admitting: Hematology and Oncology

## 2018-08-09 ENCOUNTER — Other Ambulatory Visit: Payer: Self-pay

## 2018-08-09 VITALS — BP 118/70 | HR 84 | Temp 94.5°F | Resp 18 | Wt 118.7 lb

## 2018-08-09 DIAGNOSIS — C73 Malignant neoplasm of thyroid gland: Secondary | ICD-10-CM

## 2018-08-09 DIAGNOSIS — C7800 Secondary malignant neoplasm of unspecified lung: Secondary | ICD-10-CM | POA: Diagnosis not present

## 2018-08-09 DIAGNOSIS — Z79899 Other long term (current) drug therapy: Secondary | ICD-10-CM | POA: Diagnosis not present

## 2018-08-09 DIAGNOSIS — E039 Hypothyroidism, unspecified: Secondary | ICD-10-CM | POA: Diagnosis not present

## 2018-08-09 DIAGNOSIS — I10 Essential (primary) hypertension: Secondary | ICD-10-CM

## 2018-08-09 DIAGNOSIS — G629 Polyneuropathy, unspecified: Secondary | ICD-10-CM

## 2018-08-09 DIAGNOSIS — D497 Neoplasm of unspecified behavior of endocrine glands and other parts of nervous system: Secondary | ICD-10-CM

## 2018-08-09 NOTE — Progress Notes (Signed)
Here for follow up. Overall stated " doing ok. Im here just for test results "

## 2018-08-09 NOTE — Progress Notes (Signed)
[facility] day:  08/09/2018   Chief Complaint: Laura Walters is a 84 y.o. female with thyroid carcinoma and pituitary tumor who is seen for review of interval PET scan.  HPI:  The patient was last seen in the medical oncology clinic on 08/02/2018.  At that time, she noted a new left neck lump.  Symptomatically, she felt "fair".  She was active.  She denied any upper respiratory tract symptoms.  Exam revealed a small mobile lymph node.  Given her known history of metastatic thyroid cancer, decision was made to pursue PET scan.  PET scan on 08/07/2018 revealed the left supraclavicular and left paratracheal lymph nodes were stable. There were no new progressive findings.  There was stable sized bilateral pulmonary nodules demonstrating slight increased SUV max when compared to the prior study. There were no new nodules.  There were no enlarged or hypermetabolic mediastinal or hilar lymph nodes.  There were no findings suspicious for abdominal/pelvic metastatic disease or osseous metastatic disease.  During the interim, patient is doing well today. She has acute concerns today. Patient denies any respiratory symptoms. Patient denies that she has experienced any B symptoms. She denies any interval infections.   Patient advises that she maintains an adequate appetite. She is eating well. Weight today is 118 lb 11.5 oz (53.8 kg), which compared to her last visit to the clinic, represents a 1 pound decrease.   Patient denies pain in the clinic today.   Past Medical History:  Diagnosis Date  . Arthritis   . Asthma   . Back pain   . Cancer of thyroid ([place]) 03/30/2015  . Cardiomyopathy ([place])    idiopathic  . Chest pain    non cardiac  . Chokes    easily   swallow test negative  . Constipation   . DDD (degenerative disc disease), lumbar   . Disc disorder   . GERD (gastroesophageal reflux disease)   . H/O wheezing    advair as needed  . Hypertension    . Hypothyroidism   . IBS (irritable bowel syndrome)   . Neuropathy   . Nocturia   . RAD (reactive airway disease)   . Rectocele   . Seasonal allergies   . Spinal stenosis   . Urge incontinence   . Vaginal atrophy   . Vaginal polyp     Past Surgical History:  Procedure Laterality Date  . BREAST BIOPSY Right 86 and 92   2 bx-neg  . CATARACT EXTRACTION W/PHACO Right 05/19/2016   Procedure: CATARACT EXTRACTION PHACO AND INTRAOCULAR LENS PLACEMENT (IOC);  Surgeon: Estill Cotta, MD;  Location: [facility] ORS;  Service: Ophthalmology;  Laterality: Right;  Lot# [id number] [place]:   1:25.3 AP%:  24.9% CDE:  40.11  . CATARACT EXTRACTION W/PHACO Left 08/11/2016   Procedure: CATARACT EXTRACTION PHACO AND INTRAOCULAR LENS PLACEMENT (IOC);  Surgeon: Estill Cotta, MD;  Location: [facility] ORS;  Service: Ophthalmology;  Laterality: Left;  [place] 1.24 AP% 26.1 CDE 37.79 FLUID PACK LOT # [id number]  . DILATION AND CURETTAGE OF UTERUS    . HEMORROIDECTOMY     x 2  . JOINT REPLACEMENT     left hip  . KNEE ARTHROPLASTY Right 01/05/2016   Procedure: COMPUTER ASSISTED TOTAL KNEE ARTHROPLASTY;  Surgeon: Dereck Leep, MD;  Location: [facility] ORS;  Service: Orthopedics;  Laterality: Right;  . KNEE ARTHROSCOPY Right 05/19/2015   Procedure: Right knee arthrosocpy medail and lateral menisectomy, chondroplasty ;  Surgeon: Jeneen Rinks  Vira Blanco, MD;  Location: [facility] ORS;  Service: Orthopedics;  Laterality: Right;  . Left total hip arthroplasty    . PAROTIDECTOMY    . PITUITARY SURGERY    . THYROID SURGERY     bx  . THYROIDECTOMY      Family History  Problem Relation Age of Onset  . Heart disease Father   . Diabetes Paternal Uncle   . Cancer Neg Hx   . Breast cancer Neg Hx     Social History:  reports that she has never smoked. She has never used smokeless tobacco. She reports that she does not drink alcohol or use drugs.  The patient lives alone in [place]. She has a son named Laura Walters, and a daughter named Laura Walters.  The  patient is accompanied by her son, Laura Walters, today.  Allergies:  Allergies  Allergen Reactions  . Acetaminophen-Codeine Other (See Comments)  . Aleve [Naproxen]   . Codeine Hives and Itching  . Penicillins Hives    Has patient had a PCN reaction causing immediate rash, facial/tongue/throat swelling, SOB or lightheadedness with hypotension: hives Has patient had a PCN reaction causing severe rash involving mucus membranes or skin necrosis: no Has patient had a PCN reaction that required hospitalization no Has patient had a PCN reaction occurring within the last 10 years: no If all of the above answers are "NO", then may proceed with Cephalosporin use.  . Sulfa Antibiotics Hives    Current Medications: Current Outpatient Medications  Medication Sig Dispense Refill  . docusate sodium (COLACE) 100 MG capsule Take by mouth.    . DULoxetine (CYMBALTA) 60 MG capsule Take 60 mg by mouth daily.     . ferrous sulfate 325 (65 FE) MG tablet Take 325 mg by mouth 2 (two) times daily with a meal.     . furosemide (LASIX) 20 MG tablet TAKE 1 TABLET BY MOUTH TWICE DAILY.    Marland Kitchen gabapentin (NEURONTIN) 400 MG capsule Take 800 mg by mouth 3 (three) times daily.     Marland Kitchen levothyroxine (SYNTHROID, LEVOTHROID) 88 MCG tablet     . MAGNESIUM PO Take by mouth.    . Multiple Vitamins-Minerals (PRESERVISION AREDS 2) CAPS Take 1 capsule by mouth 2 (two) times daily.    Marland Kitchen omeprazole (PRILOSEC) 40 MG capsule Take 40 mg by mouth daily.     . potassium chloride (K-DUR,KLOR-CON) 10 MEQ tablet Take 10 mEq by mouth daily.     . ranitidine (ZANTAC) 150 MG tablet Take 150 mg by mouth daily.    . diphenhydrAMINE (BENADRYL) 25 MG tablet Take 25 mg by mouth every 6 (six) hours as needed.    . Fluticasone-Salmeterol (ADVAIR DISKUS) 250-50 MCG/DOSE AEPB Inhale 1 puff into the lungs 2 (two) times daily.    . traMADol (ULTRAM) 50 MG tablet Take 50 mg by mouth every 6 (six) hours as needed.     No current facility-administered  medications for this visit.     Review of Systems  Constitutional: Positive for weight loss (1 pound). Negative for diaphoresis, fever and malaise/fatigue.  HENT: Negative.  Negative for congestion, ear discharge, ear pain, nosebleeds, sinus pain and sore throat.   Eyes: Negative.  Negative for double vision, photophobia, pain, discharge and redness.  Respiratory: Negative.  Negative for cough, hemoptysis, sputum production and shortness of breath.   Cardiovascular: Negative.  Negative for chest pain, palpitations, orthopnea, leg swelling and PND.  Gastrointestinal: Negative for abdominal pain, blood in stool, constipation, diarrhea, melena, nausea and vomiting.  Intermittent trouble swallowing.  Genitourinary: Negative.  Negative for dysuria, frequency, hematuria and urgency.  Musculoskeletal: Positive for back pain (chronic) and joint pain. Negative for falls and myalgias.  Skin: Negative.  Negative for itching and rash.  Neurological: Negative for dizziness, tremors, focal weakness, weakness and headaches.  Endo/Heme/Allergies: Does not bruise/bleed easily.       PSx (+) for thyroidectomy; on levothyroxine.   Psychiatric/Behavioral: Negative for depression and memory loss. The patient is not nervous/anxious and does not have insomnia.   All other systems reviewed and are negative.  Performance status (ECOG): 1  Vital Signs BP 118/70 (BP Location: Left Arm, Patient Position: Sitting)   Pulse 84   Temp (!) 94.5 F (34.7 C) (Tympanic)   Resp 18   Wt 118 lb 11.5 oz (53.8 kg)   BMI 23.19 kg/m   Physical Exam  Constitutional: She is oriented to person, place, and time and well-developed, well-nourished, and in no distress. Vital signs are normal. No distress.  HENT:  Head: Normocephalic and atraumatic.  Eyes: Conjunctivae are normal. No scleral icterus.  Pulmonary/Chest: No tachypnea.  Neurological: She is alert and oriented to person, place, and time. Gait normal.  Skin: She  is not diaphoretic.  Psychiatric: Mood, memory, affect and judgment normal.  Nursing note and vitals reviewed.   Hospital Outpatient Visit on 08/07/2018  Component Date Value Ref Range Status  . Glucose-Capillary 08/07/2018 90  70 - 99 mg/dL Final    Assessment:  KAYLA DESHAIES is a 83 y.o. female with recurrent thyroid cancer.  She was diagnosed with thyroid carcinoma in 08/2002.  She underwent thyroidectomy and lymph node dissection.  She received I-131 in 10/2002.    Her thyroid cancer recurred in 2012. CT scans in 01/2011 revealed lung nodules.  She received I-131 in 08/2011.  Follow-up scans revealed stable disease.  PET scan on 01/01/2016 revealed progressive disease, as evidenced by increased size and hypermetabolism of cervical nodes, thoracic nodes, and pulmonary nodules.  There was a foci of osseous hypermetabolism (indeterminate). Right posterior ninth rib and transverse process hypermetabolism could be posttraumatic, given suggestion of nondisplaced rib fracture.  I-131 scan on 02/16/2016 revealed no evidence of I-131 avid thyroid carcinoma.  Ultrasound guided biopsy of the left supraclavicular lymph node on 03/24/2016 revealed metastatic papillary thyroid carcinoma.  Dr Hanley Ben adjusts her Synthroid.  Chest CT on 07/02/2016 revealed stable metastatic disease to the lungs, with minimal enlargement of some pulmonary nodules, but no new pulmonary nodules. There was a new healing fracture of the anterior aspect of the left fifth rib.   PET scan on 12/31/2016 revealed generally similar appearance, with several abnormal hypermetabolic lymph nodes along the thoracic inlet level, and scattered hypermetabolic pulmonary nodules favoring the lung bases. The overall tumor burden has not significantly changed.  There was a 3.1 x 1.7 cm mass arising from the left side of the sella turcica intracranially which probably has some low-grade metabolic activity.   Chest CT on 07/04/2017 revealed a  mixed response to therapy, with similar number of numerous pulmonary nodules throughout the lungs bilaterally, but with some of these nodules demonstrating slight growth (2 mm) and yet other nodules demonstrating slight regression.  PET scan on 01/17/2018 revealed overall similar appearance of scattered hypermetabolic pulmonary nodules and left paratracheal lymph nodes. Left supraclavicular nodal mass was slightly increased in size (1.7 cm) in the interval.  The sellar and left cavernous sinus mass with suprasellar extension was similar to previous exam (2.6 cm; previously 3.1  cm) compatible with previously characterized recurrence of patient's pituitary tumor.   She received 5 cranial radiation treatments in 03/2017 in [place].  Head MRI during the interim revealed "no growth" per patient (no report available).  Chest CT on 07/04/2017 revealed a mixed response to therapy, with similar number of numerous pulmonary nodules throughout the lungs bilaterally, but with some of these nodules demonstrating slight growth (2 mm) and yet other nodules demonstrating slight regression.  PET scan on 08/07/2018 revealed the left supraclavicular and left paratracheal lymph nodes were stable. There were no new progressive findings.  There was stable sized bilateral pulmonary nodules demonstrating slight increased SUV max when compared to the prior study. There were no new nodules.  There were no enlarged or hypermetabolic mediastinal or hilar lymph nodes.  There were no findings suspicious for abdominal/pelvic metastatic disease or osseous metastatic disease.  Symptomatically, patient is doing well.  She denies any acute physical concerns today.  Patient has not experienced any B symptoms or recent infections.  Exam is grossly unremarkable.  Plan: 1.  Thyroid cancer:  Stable disease.  Review interval PET scan.  Images personally reviewed with patient.  Agree with radiology. 2.  Pituitary tumor:  Continue  follow-up imaging in [place]. 3.  RTC in 6 months for MD assessment and labs (CBC with diff, CMP, TSH, free T4).   Honor Loh, NP  08/09/2018, 9:38 AM  I saw and evaluated the patient, participating in the key portions of the service and reviewing pertinent diagnostic studies and records.  I reviewed the nurse practitioner's note and agree with the findings and the plan.  The assessment and plan were discussed with the patient.  A few questions were asked by the patient and answered.   Nolon Stalls, MD 08/09/2018,9:38 AM

## 2018-08-10 ENCOUNTER — Encounter: Payer: Medicare Other | Admitting: Obstetrics and Gynecology

## 2018-08-16 ENCOUNTER — Ambulatory Visit (INDEPENDENT_AMBULATORY_CARE_PROVIDER_SITE_OTHER): Payer: Medicare Other | Admitting: Obstetrics and Gynecology

## 2018-08-16 ENCOUNTER — Encounter: Payer: Self-pay | Admitting: Obstetrics and Gynecology

## 2018-08-16 VITALS — BP 99/56 | Ht 60.0 in | Wt 120.2 lb

## 2018-08-16 DIAGNOSIS — N816 Rectocele: Secondary | ICD-10-CM | POA: Diagnosis not present

## 2018-08-16 DIAGNOSIS — S30814S Abrasion of vagina and vulva, sequela: Secondary | ICD-10-CM | POA: Diagnosis not present

## 2018-08-16 DIAGNOSIS — N8111 Cystocele, midline: Secondary | ICD-10-CM | POA: Diagnosis not present

## 2018-08-16 DIAGNOSIS — N814 Uterovaginal prolapse, unspecified: Secondary | ICD-10-CM

## 2018-08-16 NOTE — Patient Instructions (Signed)
1.  Pessary is inserted today. 2.  Return in 8 weeks to see Dr. Amalia Hailey for pessary management 3.  Recommend 1/2 g Premarin cream intravaginal weekly

## 2018-08-17 NOTE — Progress Notes (Signed)
Chief complaint: 1.  Vaginal abrasion 2.  Pessary insertion  Laura Walters is seen in follow-up from last visit on 08/02/2018.  At that time her gel horn pessary was left out because of a persistent chronic vaginal abrasion that was healing.  She has since been using Premarin cream intravaginal approximately every 3 days for the past several weeks to help with healing of the posterior vaginal wall abrasion. She reports no ongoing vaginal discharge, vaginal bleeding, or vaginal odor.  She is having some increased incontinence since the pessary is out. She denies any fever chills or sweats; she denies flank pain.  Past medical history, past surgical history, problem list, medications, and allergies are reviewed  Review of systems: Complete review of systems is negative as noted with specifics in the HPI.  OBJECTIVE: BP (!) 99/56   Ht 5' (1.524 m)   Wt 120 lb 3.2 oz (54.5 kg)   BMI 23.47 kg/m  Pleasant frail elderly female in no acute distress.  She is alert and oriented. Abdomen: Soft, nontender Pelvic exam: External genitalia-normal BUS-normal Vagina-the previously noted proximal posterior vaginal wall abrasion and left lateral sidewall abrasion has healed; no vaginal discharge or vaginal bleeding is noted. Cervix-no lesions; nonbleeding bimanual-no palpable masses or tenderness Rectovaginal-normal external exam  ASSESSMENT: 1.  Vaginal abrasion secondary to gel horn pessary use, now healed 2.  Incomplete uterovaginal prolapse with cystocele and rectocele, symptomatic with increased urinary incontinence without use of pessary  PLAN: 1.  Gel 1 pessary is inserted today. 2.  Patient is to continue using Premarin cream intravaginal once a week (patient will not use Trimosan gel because of burning side effect) 3.  Return in 8 weeks for pessary maintenance with Dr. Amalia Hailey  A total of 15 minutes were spent face-to-face with the patient during this encounter and over half of that time dealt with  counseling and coordination of care.  Brayton Mars, MD  Note: This dictation was prepared with Dragon dictation along with smaller phrase technology. Any transcriptional errors that result from this process are unintentional.

## 2018-10-11 ENCOUNTER — Ambulatory Visit (INDEPENDENT_AMBULATORY_CARE_PROVIDER_SITE_OTHER): Payer: Medicare Other | Admitting: Obstetrics and Gynecology

## 2018-10-11 ENCOUNTER — Encounter: Payer: Self-pay | Admitting: Obstetrics and Gynecology

## 2018-10-11 VITALS — BP 104/63 | HR 85 | Ht 60.0 in | Wt 119.6 lb

## 2018-10-11 DIAGNOSIS — Z4689 Encounter for fitting and adjustment of other specified devices: Secondary | ICD-10-CM | POA: Diagnosis not present

## 2018-10-11 NOTE — Progress Notes (Signed)
Patient comes in today for her pessary maintenance. She has gel horn pessary. Patient states that she has a hard time having a BM and she is not sure the pessary is hurting her.

## 2018-10-11 NOTE — Progress Notes (Signed)
HPI:      [name] is a 85 y.o. No obstetric history on file. who LMP was No LMP recorded. Patient is postmenopausal.  Subjective:   She presents today for pessary maintenance.  Prior to her last visit she was having vaginal bleeding and was found to have a vaginal erosion.  She is now using vaginal estrogen cream occasionally to try to prevent this.  She reports no recent episodes of vaginal bleeding. She has considered leaving the pessary out but feels more comfortable with it in although she states it does not help with urinary incontinence at all.    Hx: The following portions of the patient's history were reviewed and updated as appropriate:             She  has a past medical history of Arthritis, Asthma, Back pain, Cancer of thyroid ([place]) (03/30/2015), Cardiomyopathy ([place]), Chest pain, Chokes, Constipation, DDD (degenerative disc disease), lumbar, Disc disorder, GERD (gastroesophageal reflux disease), H/O wheezing, Hypertension, Hypothyroidism, IBS (irritable bowel syndrome), Neuropathy, Nocturia, RAD (reactive airway disease), Rectocele, Seasonal allergies, Spinal stenosis, Urge incontinence, Vaginal atrophy, and Vaginal polyp. She does not have any pertinent problems on file. She  has a past surgical history that includes Pituitary surgery; Thyroidectomy; Hemorroidectomy; Parotidectomy; Dilation and curettage of uterus; Left total hip arthroplasty; Knee arthroscopy (Right, 05/19/2015); Knee Arthroplasty (Right, 01/05/2016); Thyroid surgery; Joint replacement; Cataract extraction w/PHACO (Right, 05/19/2016); Cataract extraction w/PHACO (Left, 08/11/2016); and Breast biopsy (Right, 86 and 92). Her family history includes Diabetes in her paternal uncle; Heart disease in her father. She  reports that she has never smoked. She has never used smokeless tobacco. She reports that she does not drink alcohol or use drugs. She has a current medication list which includes the following  prescription(s): diphenhydramine, docusate sodium, duloxetine, ferrous sulfate, fluticasone-salmeterol, furosemide, gabapentin, levothyroxine, magnesium, preservision areds 2, omeprazole, potassium chloride, ranitidine, and tramadol. She is allergic to acetaminophen-codeine; aleve [naproxen]; codeine; penicillins; and sulfa antibiotics.       Review of Systems:  Review of Systems  Constitutional: Denied constitutional symptoms, night sweats, recent illness, fatigue, fever, insomnia and weight loss.  Eyes: Denied eye symptoms, eye pain, photophobia, vision change and visual disturbance.  Ears/Nose/Throat/Neck: Denied ear, nose, throat or neck symptoms, hearing loss, nasal discharge, sinus congestion and sore throat.  Cardiovascular: Denied cardiovascular symptoms, arrhythmia, chest pain/pressure, edema, exercise intolerance, orthopnea and palpitations.  Respiratory: Denied pulmonary symptoms, asthma, pleuritic pain, productive sputum, cough, dyspnea and wheezing.  Gastrointestinal: Denied, gastro-esophageal reflux, melena, nausea and vomiting.  Genitourinary: Denied genitourinary symptoms including symptomatic vaginal discharge, pelvic relaxation issues, and urinary complaints.  Musculoskeletal: Denied musculoskeletal symptoms, stiffness, swelling, muscle weakness and myalgia.  Dermatologic: Denied dermatology symptoms, rash and scar.  Neurologic: Denied neurology symptoms, dizziness, headache, neck pain and syncope.  Psychiatric: Denied psychiatric symptoms, anxiety and depression.  Endocrine: Denied endocrine symptoms including hot flashes and night sweats.   Meds:   Current Outpatient Medications on File Prior to Visit  Medication Sig Dispense Refill  . diphenhydrAMINE (BENADRYL) 25 MG tablet Take 25 mg by mouth every 6 (six) hours as needed.    . docusate sodium (COLACE) 100 MG capsule Take by mouth.    . DULoxetine (CYMBALTA) 60 MG capsule Take 60 mg by mouth daily.     . ferrous  sulfate 325 (65 FE) MG tablet Take 325 mg by mouth 2 (two) times daily with a meal.     . Fluticasone-Salmeterol (ADVAIR DISKUS) 250-50 MCG/DOSE AEPB Inhale  1 puff into the lungs 2 (two) times daily.    . furosemide (LASIX) 20 MG tablet TAKE 1 TABLET BY MOUTH TWICE DAILY.    Marland Kitchen gabapentin (NEURONTIN) 400 MG capsule Take 800 mg by mouth 3 (three) times daily.     Marland Kitchen levothyroxine (SYNTHROID, LEVOTHROID) 88 MCG tablet     . MAGNESIUM PO Take by mouth.    . Multiple Vitamins-Minerals (PRESERVISION AREDS 2) CAPS Take 1 capsule by mouth 2 (two) times daily.    Marland Kitchen omeprazole (PRILOSEC) 40 MG capsule Take 40 mg by mouth daily.     . potassium chloride (K-DUR,KLOR-CON) 10 MEQ tablet Take 10 mEq by mouth daily.     . ranitidine (ZANTAC) 150 MG tablet Take 150 mg by mouth daily.    . traMADol (ULTRAM) 50 MG tablet Take 50 mg by mouth every 6 (six) hours as needed.     No current facility-administered medications on file prior to visit.     Objective:     Vitals:   10/11/18 1114  BP: 104/63  Pulse: 85                 Pessary removed and cleaned  Speculum examination reveals no vaginal erosions.  Assessment:    No obstetric history on file. Patient Active Problem List   Diagnosis Date Noted  . Vaginal bleeding 08/02/2018  . Abrasion of vagina 08/02/2018  . Goals of care, counseling/discussion 01/05/2018  . Uterine prolapse 02/03/2017  . Pulmonary metastases ([place]) 04/09/2016  . RAD (reactive airway disease) 02/27/2016  . Recurrent sinus infections 01/31/2016  . H/O total knee replacement 01/20/2016  . S/P total knee arthroplasty 01/05/2016  . 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 Arthropathy, traumatic, knee 12/16/2015  . Cystocele, midline 12/02/2015  . Rectocele 12/02/2015  . Frequent UTI 11/26/2015  . Acquired hypothyroidism 10/13/2015  . Pituitary tumor 10/13/2015  . Degeneration of intervertebral disc of lumbar region 08/01/2015  . Lumbar canal stenosis  08/01/2015  . Neuritis or radiculitis due to rupture of lumbar intervertebral disc 08/01/2015  . LBP (low back pain) 03/30/2015  . Neuropathy ([place]) 03/30/2015  . Chest pain, non-cardiac 03/30/2015  . Neoplasm of pituitary gland 03/30/2015  . Cancer of thyroid ([place]) 03/30/2015  . Combined fat and carbohydrate induced hyperlipemia 10/17/2014  . Primary cardiomyopathy ([place]) 10/14/2014     1. Pessary maintenance        Plan:            1.  Pessary replaced.  Patient to use estrogen cream as directed.  2.  Follow-up in 8 weeks for continued pessary maintenance. Orders No orders of the defined types were placed in this encounter.   No orders of the defined types were placed in this encounter.     F/U  Return in about 8 weeks (around 12/06/2018).  Finis Bud, M.D. 10/11/2018 12:01 PM

## 2018-11-02 ENCOUNTER — Encounter: Payer: Self-pay | Admitting: Obstetrics and Gynecology

## 2018-11-02 ENCOUNTER — Ambulatory Visit (INDEPENDENT_AMBULATORY_CARE_PROVIDER_SITE_OTHER): Payer: Medicare Other | Admitting: Obstetrics and Gynecology

## 2018-11-02 VITALS — BP 124/76 | HR 74 | Wt 119.2 lb

## 2018-11-02 DIAGNOSIS — N950 Postmenopausal bleeding: Secondary | ICD-10-CM

## 2018-11-02 DIAGNOSIS — T8389XA Other specified complication of genitourinary prosthetic devices, implants and grafts, initial encounter: Secondary | ICD-10-CM

## 2018-11-02 DIAGNOSIS — N898 Other specified noninflammatory disorders of vagina: Secondary | ICD-10-CM | POA: Diagnosis not present

## 2018-11-02 NOTE — Progress Notes (Signed)
HPI:      [name] is a 85 y.o. No obstetric history on file. who LMP was No LMP recorded. Patient is postmenopausal.  Subjective:   She presents today stating that she began having vaginal bleeding and believes is coming from the vagina.  She says it is similar to when she had a previous vaginal erosion.  She does state that she is using the Premarin vaginal cream as previously directed.  She is specifically requesting pessary removal and does not want it reinserted.    Hx: The following portions of the patient's history were reviewed and updated as appropriate:             She  has a past medical history of Arthritis, Asthma, Back pain, Cancer of thyroid ([place]) (03/30/2015), Cardiomyopathy ([place]), Chest pain, Chokes, Constipation, DDD (degenerative disc disease), lumbar, Disc disorder, GERD (gastroesophageal reflux disease), H/O wheezing, Hypertension, Hypothyroidism, IBS (irritable bowel syndrome), Neuropathy, Nocturia, RAD (reactive airway disease), Rectocele, Seasonal allergies, Spinal stenosis, Urge incontinence, Vaginal atrophy, and Vaginal polyp. She does not have any pertinent problems on file. She  has a past surgical history that includes Pituitary surgery; Thyroidectomy; Hemorroidectomy; Parotidectomy; Dilation and curettage of uterus; Left total hip arthroplasty; Knee arthroscopy (Right, 05/19/2015); Knee Arthroplasty (Right, 01/05/2016); Thyroid surgery; Joint replacement; Cataract extraction w/PHACO (Right, 05/19/2016); Cataract extraction w/PHACO (Left, 08/11/2016); and Breast biopsy (Right, 86 and 92). Her family history includes Diabetes in her paternal uncle; Heart disease in her father. She  reports that she has never smoked. She has never used smokeless tobacco. She reports that she does not drink alcohol or use drugs. She has a current medication list which includes the following prescription(s): ciprofloxacin, diphenhydramine, docusate sodium, duloxetine, ferrous sulfate,  fluticasone-salmeterol, furosemide, gabapentin, levothyroxine, magnesium, preservision areds 2, omeprazole, potassium chloride, ranitidine, and tramadol. She is allergic to acetaminophen-codeine; aleve [naproxen]; codeine; penicillins; and sulfa antibiotics.       Review of Systems:  Review of Systems  Constitutional: Denied constitutional symptoms, night sweats, recent illness, fatigue, fever, insomnia and weight loss.  Eyes: Denied eye symptoms, eye pain, photophobia, vision change and visual disturbance.  Ears/Nose/Throat/Neck: Denied ear, nose, throat or neck symptoms, hearing loss, nasal discharge, sinus congestion and sore throat.  Cardiovascular: Denied cardiovascular symptoms, arrhythmia, chest pain/pressure, edema, exercise intolerance, orthopnea and palpitations.  Respiratory: Denied pulmonary symptoms, asthma, pleuritic pain, productive sputum, cough, dyspnea and wheezing.  Gastrointestinal: Denied, gastro-esophageal reflux, melena, nausea and vomiting.  Genitourinary: See HPI for additional information.  Musculoskeletal: Denied musculoskeletal symptoms, stiffness, swelling, muscle weakness and myalgia.  Dermatologic: Denied dermatology symptoms, rash and scar.  Neurologic: Denied neurology symptoms, dizziness, headache, neck pain and syncope.  Psychiatric: Denied psychiatric symptoms, anxiety and depression.  Endocrine: Denied endocrine symptoms including hot flashes and night sweats.   Meds:   Current Outpatient Medications on File Prior to Visit  Medication Sig Dispense Refill  . ciprofloxacin (CIPRO) 500 MG tablet Take 500 mg by mouth 2 (two) times daily.    . diphenhydrAMINE (BENADRYL) 25 MG tablet Take 25 mg by mouth every 6 (six) hours as needed.    . docusate sodium (COLACE) 100 MG capsule Take by mouth.    . DULoxetine (CYMBALTA) 60 MG capsule Take 60 mg by mouth daily.     . ferrous sulfate 325 (65 FE) MG tablet Take 325 mg by mouth 2 (two) times daily with a meal.      . Fluticasone-Salmeterol (ADVAIR DISKUS) 250-50 MCG/DOSE AEPB Inhale 1 puff into the  lungs 2 (two) times daily.    . furosemide (LASIX) 20 MG tablet TAKE 1 TABLET BY MOUTH TWICE DAILY.    Marland Kitchen gabapentin (NEURONTIN) 400 MG capsule Take 800 mg by mouth 3 (three) times daily.     Marland Kitchen levothyroxine (SYNTHROID, LEVOTHROID) 88 MCG tablet     . MAGNESIUM PO Take by mouth.    . Multiple Vitamins-Minerals (PRESERVISION AREDS 2) CAPS Take 1 capsule by mouth 2 (two) times daily.    Marland Kitchen omeprazole (PRILOSEC) 40 MG capsule Take 40 mg by mouth daily.     . potassium chloride (K-DUR,KLOR-CON) 10 MEQ tablet Take 10 mEq by mouth daily.     . ranitidine (ZANTAC) 150 MG tablet Take 150 mg by mouth daily.    . traMADol (ULTRAM) 50 MG tablet Take 50 mg by mouth every 6 (six) hours as needed.     No current facility-administered medications on file prior to visit.     Objective:     Vitals:   11/02/18 1006  BP: 124/76  Pulse: 74              Pessary Care Pessary removed and cleaned.  Vagina checked by speculum exam-small anterior erosion noted - pessary not replaced.    Assessment:    No obstetric history on file. Patient Active Problem List   Diagnosis Date Noted  . Vaginal bleeding 08/02/2018  . Abrasion of vagina 08/02/2018  . Goals of care, counseling/discussion 01/05/2018  . Uterine prolapse 02/03/2017  . Pulmonary metastases ([place]) 04/09/2016  . RAD (reactive airway disease) 02/27/2016  . Recurrent sinus infections 01/31/2016  . H/O total knee replacement 01/20/2016  . S/P total knee arthroplasty 01/05/2016  . 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 Arthropathy, traumatic, knee 12/16/2015  . Cystocele, midline 12/02/2015  . Rectocele 12/02/2015  . Frequent UTI 11/26/2015  . Acquired hypothyroidism 10/13/2015  . Pituitary tumor 10/13/2015  . Degeneration of intervertebral disc of lumbar region 08/01/2015  . Lumbar canal stenosis 08/01/2015  . Neuritis or radiculitis  due to rupture of lumbar intervertebral disc 08/01/2015  . LBP (low back pain) 03/30/2015  . Neuropathy ([place]) 03/30/2015  . Chest pain, non-cardiac 03/30/2015  . Neoplasm of pituitary gland 03/30/2015  . Cancer of thyroid ([place]) 03/30/2015  . Combined fat and carbohydrate induced hyperlipemia 10/17/2014  . Primary cardiomyopathy ([place]) 10/14/2014     1. Postmenopausal bleeding   2. Vaginal erosion secondary to pessary use, initial encounter ([place])        Plan:            1.  Patient has requested that she schedule her own follow-up for pessary reinsertion.  She has been instructed in the use of Premarin vaginal cream. Orders No orders of the defined types were placed in this encounter.   No orders of the defined types were placed in this encounter.     F/U  No follow-ups on file. I spent 15 minutes involved in the care of this patient of which greater than 50% was spent discussing pessary care and maintenance, use of Premarin vaginal cream, vaginal erosions and resolution.  Timing of reinsertion.  All questions answered.  Finis Bud, M.D. 11/02/2018 10:22 AM

## 2018-11-03 ENCOUNTER — Inpatient Hospital Stay
Admission: EM | Admit: 2018-11-03 | Discharge: 2018-11-04 | DRG: 309 | Disposition: A | Discharge status: Home / Self Care | Payer: Medicare Other | Attending: Internal Medicine | Admitting: Internal Medicine

## 2018-11-03 ENCOUNTER — Emergency Department: Payer: Medicare Other

## 2018-11-03 ENCOUNTER — Other Ambulatory Visit: Payer: Self-pay

## 2018-11-03 DIAGNOSIS — R001 Bradycardia, unspecified: Secondary | ICD-10-CM | POA: Diagnosis not present

## 2018-11-03 DIAGNOSIS — Z96651 Presence of right artificial knee joint: Secondary | ICD-10-CM | POA: Diagnosis present

## 2018-11-03 DIAGNOSIS — R55 Syncope and collapse: Secondary | ICD-10-CM

## 2018-11-03 DIAGNOSIS — Z85118 Personal history of other malignant neoplasm of bronchus and lung: Secondary | ICD-10-CM

## 2018-11-03 DIAGNOSIS — K219 Gastro-esophageal reflux disease without esophagitis: Secondary | ICD-10-CM | POA: Diagnosis present

## 2018-11-03 DIAGNOSIS — Z79891 Long term (current) use of opiate analgesic: Secondary | ICD-10-CM

## 2018-11-03 DIAGNOSIS — Z885 Allergy status to narcotic agent status: Secondary | ICD-10-CM

## 2018-11-03 DIAGNOSIS — I428 Other cardiomyopathies: Secondary | ICD-10-CM | POA: Diagnosis present

## 2018-11-03 DIAGNOSIS — Z85850 Personal history of malignant neoplasm of thyroid: Secondary | ICD-10-CM

## 2018-11-03 DIAGNOSIS — D352 Benign neoplasm of pituitary gland: Secondary | ICD-10-CM | POA: Diagnosis present

## 2018-11-03 DIAGNOSIS — N390 Urinary tract infection, site not specified: Secondary | ICD-10-CM | POA: Diagnosis present

## 2018-11-03 DIAGNOSIS — I4581 Long QT syndrome: Secondary | ICD-10-CM | POA: Diagnosis not present

## 2018-11-03 DIAGNOSIS — Z96642 Presence of left artificial hip joint: Secondary | ICD-10-CM | POA: Diagnosis present

## 2018-11-03 DIAGNOSIS — Z833 Family history of diabetes mellitus: Secondary | ICD-10-CM

## 2018-11-03 DIAGNOSIS — Z9841 Cataract extraction status, right eye: Secondary | ICD-10-CM

## 2018-11-03 DIAGNOSIS — Z79890 Hormone replacement therapy (postmenopausal): Secondary | ICD-10-CM

## 2018-11-03 DIAGNOSIS — C73 Malignant neoplasm of thyroid gland: Secondary | ICD-10-CM | POA: Diagnosis present

## 2018-11-03 DIAGNOSIS — M5136 Other intervertebral disc degeneration, lumbar region: Secondary | ICD-10-CM | POA: Diagnosis present

## 2018-11-03 DIAGNOSIS — Z880 Allergy status to penicillin: Secondary | ICD-10-CM

## 2018-11-03 DIAGNOSIS — E785 Hyperlipidemia, unspecified: Secondary | ICD-10-CM | POA: Diagnosis present

## 2018-11-03 DIAGNOSIS — K589 Irritable bowel syndrome without diarrhea: Secondary | ICD-10-CM | POA: Diagnosis present

## 2018-11-03 DIAGNOSIS — Z9842 Cataract extraction status, left eye: Secondary | ICD-10-CM

## 2018-11-03 DIAGNOSIS — G629 Polyneuropathy, unspecified: Secondary | ICD-10-CM | POA: Diagnosis present

## 2018-11-03 DIAGNOSIS — J449 Chronic obstructive pulmonary disease, unspecified: Secondary | ICD-10-CM | POA: Diagnosis present

## 2018-11-03 DIAGNOSIS — Z79899 Other long term (current) drug therapy: Secondary | ICD-10-CM

## 2018-11-03 DIAGNOSIS — I10 Essential (primary) hypertension: Secondary | ICD-10-CM | POA: Diagnosis present

## 2018-11-03 DIAGNOSIS — Z8249 Family history of ischemic heart disease and other diseases of the circulatory system: Secondary | ICD-10-CM

## 2018-11-03 DIAGNOSIS — R9431 Abnormal electrocardiogram [ECG] [EKG]: Secondary | ICD-10-CM | POA: Diagnosis present

## 2018-11-03 DIAGNOSIS — Z961 Presence of intraocular lens: Secondary | ICD-10-CM | POA: Diagnosis present

## 2018-11-03 DIAGNOSIS — Z888 Allergy status to other drugs, medicaments and biological substances status: Secondary | ICD-10-CM

## 2018-11-03 DIAGNOSIS — E890 Postprocedural hypothyroidism: Secondary | ICD-10-CM | POA: Diagnosis present

## 2018-11-03 DIAGNOSIS — Z882 Allergy status to sulfonamides status: Secondary | ICD-10-CM

## 2018-11-03 DIAGNOSIS — M1990 Unspecified osteoarthritis, unspecified site: Secondary | ICD-10-CM | POA: Diagnosis present

## 2018-11-03 DIAGNOSIS — Z66 Do not resuscitate: Secondary | ICD-10-CM | POA: Diagnosis present

## 2018-11-03 DIAGNOSIS — Z7951 Long term (current) use of inhaled steroids: Secondary | ICD-10-CM

## 2018-11-03 LAB — HEPATIC FUNCTION PANEL
ALBUMIN: 3.3 g/dL — AB (ref 3.5–5.0)
ALT: 23 U/L (ref 0–44)
AST: 35 U/L (ref 15–41)
Alkaline Phosphatase: 90 U/L (ref 38–126)
Bilirubin, Direct: 0.1 mg/dL (ref 0.0–0.2)
Indirect Bilirubin: 0.4 mg/dL (ref 0.3–0.9)
Total Bilirubin: 0.5 mg/dL (ref 0.3–1.2)
Total Protein: 6.2 g/dL — ABNORMAL LOW (ref 6.5–8.1)

## 2018-11-03 LAB — CBC
HEMATOCRIT: 35.1 % — AB (ref 36.0–46.0)
HEMOGLOBIN: 11.1 g/dL — AB (ref 12.0–15.0)
MCH: 29.8 pg (ref 26.0–34.0)
MCHC: 31.6 g/dL (ref 30.0–36.0)
MCV: 94.1 fL (ref 80.0–100.0)
PLATELETS: 296 10*3/uL (ref 150–400)
RBC: 3.73 MIL/uL — ABNORMAL LOW (ref 3.87–5.11)
RDW: 13.2 % (ref 11.5–15.5)
WBC: 7.7 10*3/uL (ref 4.0–10.5)
nRBC: 0 % (ref 0.0–0.2)

## 2018-11-03 LAB — TSH: TSH: 0.199 u[IU]/mL — ABNORMAL LOW (ref 0.350–4.500)

## 2018-11-03 LAB — BASIC METABOLIC PANEL
Anion gap: 9 (ref 5–15)
BUN: 15 mg/dL (ref 8–23)
CO2: 24 mmol/L (ref 22–32)
Calcium: 7.9 mg/dL — ABNORMAL LOW (ref 8.9–10.3)
Chloride: 104 mmol/L (ref 98–111)
Creatinine, Ser: 0.9 mg/dL (ref 0.44–1.00)
GFR calc Af Amer: >60 mL/min (ref >60)
GFR calc non Af Amer: 59 mL/min — ABNORMAL LOW (ref >60)
Glucose, Bld: 103 mg/dL — ABNORMAL HIGH (ref 70–99)
POTASSIUM: 4.2 mmol/L (ref 3.5–5.1)
SODIUM: 137 mmol/L (ref 135–145)

## 2018-11-03 LAB — MAGNESIUM: MAGNESIUM: 2.3 mg/dL (ref 1.7–2.4)

## 2018-11-03 LAB — URINALYSIS, COMPLETE (UACMP) WITH MICROSCOPIC
Bacteria, UA: NONE SEEN
Bilirubin Urine: NEGATIVE
GLUCOSE, UA: NEGATIVE mg/dL
HGB URINE DIPSTICK: NEGATIVE
Ketones, ur: NEGATIVE mg/dL
NITRITE: NEGATIVE
PH: 7 (ref 5.0–8.0)
PROTEIN: NEGATIVE mg/dL
SPECIFIC GRAVITY, URINE: 1.012 (ref 1.005–1.030)

## 2018-11-03 LAB — INFLUENZA PANEL BY PCR (TYPE A & B)
Influenza A By PCR: NEGATIVE
Influenza B By PCR: NEGATIVE

## 2018-11-03 LAB — TROPONIN I

## 2018-11-03 MED ORDER — LEVOTHYROXINE SODIUM 88 MCG PO TABS
88.0000 ug | ORAL_TABLET | Freq: Every day | ORAL | Status: DC
  Administered 2018-11-04: 06:00:00 88 ug via ORAL
  Filled 2018-11-03: qty 1

## 2018-11-03 MED ORDER — FERROUS SULFATE 325 (65 FE) MG PO TABS
325.0000 mg | ORAL_TABLET | Freq: Every day | ORAL | Status: DC
  Administered 2018-11-04: 09:00:00 325 mg via ORAL
  Filled 2018-11-03: qty 1

## 2018-11-03 MED ORDER — MOMETASONE FURO-FORMOTEROL FUM 200-5 MCG/ACT IN AERO
2.0000 | INHALATION_SPRAY | Freq: Two times a day (BID) | RESPIRATORY_TRACT | Status: DC
  Administered 2018-11-04: 09:00:00 2 via RESPIRATORY_TRACT
  Filled 2018-11-03: qty 8.8

## 2018-11-03 MED ORDER — DOCUSATE SODIUM 100 MG PO CAPS
100.0000 mg | ORAL_CAPSULE | Freq: Two times a day (BID) | ORAL | Status: DC
  Administered 2018-11-03 – 2018-11-04 (×2): 100 mg via ORAL
  Filled 2018-11-03 (×2): qty 1

## 2018-11-03 MED ORDER — DOCUSATE SODIUM 100 MG PO CAPS: 100.0000 mg | ORAL_CAPSULE | Freq: Two times a day (BID) | ORAL | Status: DC | PRN

## 2018-11-03 MED ORDER — DULOXETINE HCL 30 MG PO CPEP
60.0000 mg | ORAL_CAPSULE | Freq: Every day | ORAL | Status: DC
  Administered 2018-11-04: 09:00:00 60 mg via ORAL
  Filled 2018-11-03: qty 2

## 2018-11-03 MED ORDER — FUROSEMIDE 20 MG PO TABS
20.0000 mg | ORAL_TABLET | Freq: Two times a day (BID) | ORAL | Status: DC
  Filled 2018-11-03: qty 1

## 2018-11-03 MED ORDER — TRAMADOL HCL 50 MG PO TABS: 50.0000 mg | ORAL_TABLET | Freq: Four times a day (QID) | ORAL | Status: DC | PRN

## 2018-11-03 MED ORDER — SODIUM CHLORIDE 0.9% FLUSH: 3.0000 mL | Freq: Once | INTRAVENOUS | Status: DC

## 2018-11-03 MED ORDER — IOHEXOL 350 MG/ML SOLN
75.0000 mL | Freq: Once | INTRAVENOUS | Status: AC | PRN
  Administered 2018-11-03: 75 mL via INTRAVENOUS

## 2018-11-03 MED ORDER — DIPHENHYDRAMINE HCL 25 MG PO CAPS
25.0000 mg | ORAL_CAPSULE | Freq: Four times a day (QID) | ORAL | Status: DC | PRN
  Administered 2018-11-03: 22:00:00 25 mg via ORAL
  Filled 2018-11-03: qty 1

## 2018-11-03 MED ORDER — PANTOPRAZOLE SODIUM 40 MG PO TBEC
40.0000 mg | DELAYED_RELEASE_TABLET | Freq: Every day | ORAL | Status: DC
  Administered 2018-11-04: 09:00:00 40 mg via ORAL
  Filled 2018-11-03: qty 1

## 2018-11-03 MED ORDER — OCUVITE-LUTEIN PO CAPS
1.0000 | ORAL_CAPSULE | Freq: Two times a day (BID) | ORAL | Status: DC
  Administered 2018-11-03 – 2018-11-04 (×2): 1 via ORAL
  Filled 2018-11-03 (×3): qty 1

## 2018-11-03 MED ORDER — GABAPENTIN 400 MG PO CAPS
800.0000 mg | ORAL_CAPSULE | Freq: Two times a day (BID) | ORAL | Status: DC
  Administered 2018-11-03 – 2018-11-04 (×2): 800 mg via ORAL
  Filled 2018-11-03: qty 8
  Filled 2018-11-03 (×3): qty 2

## 2018-11-03 MED ORDER — HEPARIN SODIUM (PORCINE) 5000 UNIT/ML IJ SOLN
5000.0000 [IU] | Freq: Three times a day (TID) | INTRAMUSCULAR | Status: DC
  Administered 2018-11-03 – 2018-11-04 (×2): 5000 [IU] via SUBCUTANEOUS
  Filled 2018-11-03 (×2): qty 1

## 2018-11-03 MED ORDER — POTASSIUM CHLORIDE CRYS ER 10 MEQ PO TBCR
10.0000 meq | EXTENDED_RELEASE_TABLET | Freq: Every day | ORAL | Status: DC
  Administered 2018-11-04: 10 meq via ORAL
  Filled 2018-11-03: qty 1

## 2018-11-03 MED ORDER — MAGNESIUM OXIDE 400 (241.3 MG) MG PO TABS
200.0000 mg | ORAL_TABLET | Freq: Every day | ORAL | Status: DC
  Administered 2018-11-04: 200 mg via ORAL
  Filled 2018-11-03: qty 1

## 2018-11-03 NOTE — ED Provider Notes (Addendum)
[facility] Emergency Department Provider Note       Time seen: ----------------------------------------- 12:37 PM on 11/03/2018 -----------------------------------------   I have reviewed the triage vital signs and the nursing notes.  HISTORY   Chief Complaint Loss of Consciousness    HPI Laura Walters is a 85 y.o. female with a history of arthritis, asthma, cardiomyopathy, GERD, hypothyroidism who presents to the ED for syncope.  Patient has syncope at the hair salon while under the hair dryer.  Patient states this is the third time in 2 weeks as she has had a syncopal episode.  On arrival by EMS she was found to be hypotensive.  She was given some fluids by EMS and currently feels better.  No obvious etiology was discovered by her doctor this week.  Past Medical History:  Diagnosis Date  . Arthritis   . Asthma   . Back pain   . Cancer of thyroid ([place]) 03/30/2015  . Cardiomyopathy ([place])    idiopathic  . Chest pain    non cardiac  . Chokes    easily   swallow test negative  . Constipation   . DDD (degenerative disc disease), lumbar   . Disc disorder   . GERD (gastroesophageal reflux disease)   . H/O wheezing    advair as needed  . Hypertension   . Hypothyroidism   . IBS (irritable bowel syndrome)   . Neuropathy   . Nocturia   . RAD (reactive airway disease)   . Rectocele   . Seasonal allergies   . Spinal stenosis   . Urge incontinence   . Vaginal atrophy   . Vaginal polyp     Patient Active Problem List   Diagnosis Date Noted  . Vaginal bleeding 08/02/2018  . Abrasion of vagina 08/02/2018  . Goals of care, counseling/discussion 01/05/2018  . Uterine prolapse 02/03/2017  . Pulmonary metastases ([place]) 04/09/2016  . RAD (reactive airway disease) 02/27/2016  . Recurrent sinus infections 01/31/2016  . H/O total knee replacement 01/20/2016  . S/P total knee arthroplasty 01/05/2016  . 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 Arthropathy, traumatic, knee 12/16/2015  . Cystocele, midline 12/02/2015  . Rectocele 12/02/2015  . Frequent UTI 11/26/2015  . Acquired hypothyroidism 10/13/2015  . Pituitary tumor 10/13/2015  . Degeneration of intervertebral disc of lumbar region 08/01/2015  . Lumbar canal stenosis 08/01/2015  . Neuritis or radiculitis due to rupture of lumbar intervertebral disc 08/01/2015  . LBP (low back pain) 03/30/2015  . Neuropathy ([place]) 03/30/2015  . Chest pain, non-cardiac 03/30/2015  . Neoplasm of pituitary gland 03/30/2015  . Cancer of thyroid ([place]) 03/30/2015  . Combined fat and carbohydrate induced hyperlipemia 10/17/2014  . Primary cardiomyopathy ([place]) 10/14/2014    Past Surgical History:  Procedure Laterality Date  . BREAST BIOPSY Right 86 and 92   2 bx-neg  . CATARACT EXTRACTION W/PHACO Right 05/19/2016   Procedure: CATARACT EXTRACTION PHACO AND INTRAOCULAR LENS PLACEMENT (IOC);  Surgeon: Estill Cotta, MD;  Location: [facility] ORS;  Service: Ophthalmology;  Laterality: Right;  Lot# [id number] [place]:   1:25.3 AP%:  24.9% CDE:  40.11  . CATARACT EXTRACTION W/PHACO Left 08/11/2016   Procedure: CATARACT EXTRACTION PHACO AND INTRAOCULAR LENS PLACEMENT (IOC);  Surgeon: Estill Cotta, MD;  Location: [facility] ORS;  Service: Ophthalmology;  Laterality: Left;  [place] 1.24 AP% 26.1 CDE 37.79 FLUID PACK LOT # [id number]  . DILATION AND CURETTAGE OF UTERUS    . HEMORROIDECTOMY  x 2  . JOINT REPLACEMENT     left hip  . KNEE ARTHROPLASTY Right 01/05/2016   Procedure: COMPUTER ASSISTED TOTAL KNEE ARTHROPLASTY;  Surgeon: Dereck Leep, MD;  Location: [facility] ORS;  Service: Orthopedics;  Laterality: Right;  . KNEE ARTHROSCOPY Right 05/19/2015   Procedure: Right knee arthrosocpy medail and lateral menisectomy, chondroplasty ;  Surgeon: Dereck Leep, MD;  Location: [facility] ORS;  Service: Orthopedics;  Laterality: Right;  . Left total hip arthroplasty    . PAROTIDECTOMY    . PITUITARY SURGERY     . THYROID SURGERY     bx  . THYROIDECTOMY      Allergies Acetaminophen-codeine; Aleve [naproxen]; Codeine; Penicillins; and Sulfa antibiotics  Social History Social History   Tobacco Use  . Smoking status: Never Smoker  . Smokeless tobacco: Never Used  Substance Use Topics  . Alcohol use: No  . Drug use: No   Review of Systems Constitutional: Negative for fever. Cardiovascular: Negative for chest pain. Respiratory: Negative for shortness of breath. Gastrointestinal: Negative for abdominal pain, vomiting and diarrhea. Musculoskeletal: Negative for back pain. Skin: Negative for rash. Neurological: Negative for headaches, focal weakness or numbness.  All systems negative/normal/unremarkable except as stated in the HPI  ____________________________________________   PHYSICAL EXAM:  VITAL SIGNS: ED Triage Vitals [11/03/18 1233]  Enc Vitals Group     BP      Pulse      Resp      Temp      Temp src      SpO2      Weight 119 lb 0.8 oz (54 kg)     Height 5\' 5"  (1.651 m)     Head Circumference      Peak Flow      Pain Score      Pain Loc      Pain Edu?      Excl. in [place]?    Constitutional: Alert and oriented. Well appearing and in no distress. Eyes: Conjunctivae are normal. Normal extraocular movements. ENT      Head: Normocephalic and atraumatic.      Nose: No congestion/rhinnorhea.      Mouth/Throat: Mucous membranes are moist.      Neck: No stridor. Cardiovascular: Normal rate, regular rhythm. No murmurs, rubs, or gallops. Respiratory: Normal respiratory effort without tachypnea nor retractions. Breath sounds are clear and equal bilaterally. No wheezes/rales/rhonchi. Gastrointestinal: Soft and nontender. Normal bowel sounds Musculoskeletal: Nontender with normal range of motion in extremities. No lower extremity tenderness nor edema. Neurologic:  Normal speech and language. No gross focal neurologic deficits are appreciated.  Skin:  Skin is warm, dry and  intact. No rash noted. Psychiatric: Mood and affect are normal. Speech and behavior are normal.  ____________________________________________  EKG: Interpreted by me.  Sinus bradycardia rate of 47 bpm, low voltage, long QT, normal axis.  ____________________________________________  ED COURSE:  As part of my medical decision making, I reviewed the following data within the [place] History obtained from family if available, nursing notes, old chart and ekg, as well as notes from prior ED visits. Patient presented for syncope, we will assess with labs and imaging as indicated at this time. Clinical Course as of Nov 04 1319  Fri Nov 03, 2018  1311 Patient had a witnessed episode of bradycardia which may explain her syncope.   [JW]    Clinical Course User Index [JW] Earleen Newport, MD   Procedures ____________________________________________   LABS (pertinent  positives/negatives)  Labs Reviewed  BASIC METABOLIC PANEL - Abnormal; Notable for the following components:      Result Value   Glucose, Bld 103 (*)    Calcium 7.9 (*)    GFR calc non Af Amer 59 (*)    All other components within normal limits  CBC - Abnormal; Notable for the following components:   RBC 3.73 (*)    Hemoglobin 11.1 (*)    HCT 35.1 (*)    All other components within normal limits  INFLUENZA PANEL BY PCR (TYPE A & B)  TROPONIN I  URINALYSIS, COMPLETE (UACMP) WITH MICROSCOPIC  HEPATIC FUNCTION PANEL  CBG MONITORING, ED    RADIOLOGY Images were viewed by me  Chest x-ray/CTA IMPRESSION: No pulmonary embolus.  Multiple pulmonary nodules in both lungs consistent with known metastasis. Majority of the nodules are not significantly changed compared to prior exam. However, the largest nodule in the left lower lobe is increased in size currently measuring 1.6 x 1.7 cm. ____________________________________________   DIFFERENTIAL DIAGNOSIS   Dehydration, electrolyte  abnormality, arrhythmia, MI, PE, orthostatic hypotension, sepsis  FINAL ASSESSMENT AND PLAN  Syncope, bradycardia   Plan: The patient had presented for recurrent episodes of syncope. Patient's labs did not reveal any acute process. Patient's imaging revealed no pulmonary nodules but no acute worsening in her metastasis.  She did have a slightly increased nodule in the left side.  She was given IV fluids here and is asymptomatic.  I have recommended admission to the hospital on multiple occasions but she has declined.  She was found to be bradycardic here during the course of her stay.     Laurence Aly, MD    Note: This note was generated in part or whole with voice recognition software. Voice recognition is usually quite accurate but there are transcription errors that can and very often do occur. I apologize for any typographical errors that were not detected and corrected.     Earleen Newport, MD 11/03/18 1510    Earleen Newport, MD 11/03/18 [phone]

## 2018-11-03 NOTE — H&P (Signed)
[facility] [place] NAME: Laura Walters    MR#:  [id number]  DATE OF BIRTH:  Aug 12, 1933  DATE OF ADMISSION:  11/03/2018  PRIMARY CARE PHYSICIAN: Idelle Crouch, MD   REQUESTING/REFERRING PHYSICIAN: Gwyndolyn Saxon  CHIEF COMPLAINT:   Chief Complaint  Patient presents with  . Loss of Consciousness    HISTORY OF PRESENT ILLNESS: Laura Walters  is a 85 y.o. female with a known history of Asthma, Thyroid cancer- s/p surgery, mets in lung, cardiomyopathy, Htn, IBS, had syncopal episode twice last week- went to [facility] - PMD. EKG had some QT prolongation. SHe had a positive UA 2 weeks ago, so PMD sent home with cipro oral, took for 1 days. Again had syncopal episode today- lost consciousness for a few seconds with complete recovery. Denies any pre- or post weakness/ numbness/ headache/ vertigo/ abnormal activities. No injuries during episodes. She was referred to Dr. Ubaldo Glassing- visit next week in clinic but came to ER today. Work up negative in ER for Flu, UTI, electrolytes normal.  PAST MEDICAL HISTORY:   Past Medical History:  Diagnosis Date  . Arthritis   . Asthma   . Back pain   . Cancer of thyroid ([place]) 03/30/2015  . Cardiomyopathy ([place])    idiopathic  . Chest pain    non cardiac  . Chokes    easily   swallow test negative  . Constipation   . DDD (degenerative disc disease), lumbar   . Disc disorder   . GERD (gastroesophageal reflux disease)   . H/O wheezing    advair as needed  . Hypertension   . Hypothyroidism   . IBS (irritable bowel syndrome)   . Neuropathy   . Nocturia   . RAD (reactive airway disease)   . Rectocele   . Seasonal allergies   . Spinal stenosis   . Urge incontinence   . Vaginal atrophy   . Vaginal polyp     PAST SURGICAL HISTORY:  Past Surgical History:  Procedure Laterality Date  . BREAST BIOPSY Right 86 and 92   2 bx-neg  . CATARACT EXTRACTION W/PHACO Right 05/19/2016   Procedure: CATARACT EXTRACTION PHACO AND  INTRAOCULAR LENS PLACEMENT (IOC);  Surgeon: Estill Cotta, MD;  Location: [facility] ORS;  Service: Ophthalmology;  Laterality: Right;  Lot# [id number] H [place]:   1:25.3 AP%:  24.9% CDE:  40.11  . CATARACT EXTRACTION W/PHACO Left 08/11/2016   Procedure: CATARACT EXTRACTION PHACO AND INTRAOCULAR LENS PLACEMENT (IOC);  Surgeon: Estill Cotta, MD;  Location: [facility] ORS;  Service: Ophthalmology;  Laterality: Left;  [place] 1.24 AP% 26.1 CDE 37.79 FLUID PACK LOT # [id number]  . DILATION AND CURETTAGE OF UTERUS    . HEMORROIDECTOMY     x 2  . JOINT REPLACEMENT     left hip  . KNEE ARTHROPLASTY Right 01/05/2016   Procedure: COMPUTER ASSISTED TOTAL KNEE ARTHROPLASTY;  Surgeon: Dereck Leep, MD;  Location: [facility] ORS;  Service: Orthopedics;  Laterality: Right;  . KNEE ARTHROSCOPY Right 05/19/2015   Procedure: Right knee arthrosocpy medail and lateral menisectomy, chondroplasty ;  Surgeon: Dereck Leep, MD;  Location: [facility] ORS;  Service: Orthopedics;  Laterality: Right;  . Left total hip arthroplasty    . PAROTIDECTOMY    . PITUITARY SURGERY    . THYROID SURGERY     bx  . THYROIDECTOMY      SOCIAL HISTORY:  Social History   Tobacco Use  . Smoking status: Never Smoker  . Smokeless  tobacco: Never Used  Substance Use Topics  . Alcohol use: No    FAMILY HISTORY:  Family History  Problem Relation Age of Onset  . Heart disease Father   . Diabetes Paternal Uncle   . Cancer Neg Hx   . Breast cancer Neg Hx     DRUG ALLERGIES:  Allergies  Allergen Reactions  . Acetaminophen-Codeine Other (See Comments)  . Aleve [Naproxen] Other (See Comments)    Causes dizziness  . Penicillins Hives    Has patient had a PCN reaction causing immediate rash, facial/tongue/throat swelling, SOB or lightheadedness with hypotension: hives Has patient had a PCN reaction causing severe rash involving mucus membranes or skin necrosis: no Has patient had a PCN reaction that required hospitalization no Has patient had a  PCN reaction occurring within the last 10 years: no If all of the above answers are "NO", then may proceed with Cephalosporin use.  . Sulfa Antibiotics Hives  . Codeine Hives and Itching    REVIEW OF SYSTEMS:   CONSTITUTIONAL: No fever, fatigue or weakness.  EYES: No blurred or double vision.  EARS, NOSE, AND THROAT: No tinnitus or ear pain.  RESPIRATORY: No cough, shortness of breath, wheezing or hemoptysis.  CARDIOVASCULAR: No chest pain, orthopnea, edema.  GASTROINTESTINAL: No nausea, vomiting, diarrhea or abdominal pain.  GENITOURINARY: No dysuria, hematuria.  ENDOCRINE: No polyuria, nocturia,  HEMATOLOGY: No anemia, easy bruising or bleeding SKIN: No rash or lesion. MUSCULOSKELETAL: No joint pain or arthritis.   NEUROLOGIC: No tingling, numbness, weakness.  PSYCHIATRY: No anxiety or depression.   MEDICATIONS AT HOME:  Prior to Admission medications   Medication Sig Start Date End Date Taking? Authorizing Provider  ciprofloxacin (CIPRO) 500 MG tablet Take 500 mg by mouth 2 (two) times daily.   Yes [provider]  docusate sodium (COLACE) 100 MG capsule Take 100-200 mg by mouth 2 (two) times daily.    Yes [provider]  DULoxetine (CYMBALTA) 60 MG capsule Take 60 mg by mouth daily.  02/02/16  Yes [provider]  ferrous sulfate 325 (65 FE) MG tablet Take 325 mg by mouth daily with breakfast.    Yes [provider]  Fluticasone-Salmeterol (ADVAIR DISKUS) 250-50 MCG/DOSE AEPB Inhale 1 puff into the lungs 2 (two) times daily.   Yes [provider]  gabapentin (NEURONTIN) 400 MG capsule Take 800 mg by mouth 2 (two) times daily.  02/09/16  Yes [provider]  levothyroxine (SYNTHROID, LEVOTHROID) 88 MCG tablet  10/04/17  Yes [provider]  MAGNESIUM PO Take 250 mg by mouth daily.    Yes [provider]  Multiple Vitamins-Minerals (PRESERVISION AREDS 2) CAPS Take 1 capsule by mouth 2 (two) times daily.   Yes  [provider]  omeprazole (PRILOSEC) 40 MG capsule Take 40 mg by mouth daily.  02/16/16  Yes [provider]  potassium chloride (K-DUR,KLOR-CON) 10 MEQ tablet Take 10 mEq by mouth daily.    Yes [provider]  diphenhydrAMINE (BENADRYL) 25 MG tablet Take 25 mg by mouth every 6 (six) hours as needed.    [provider]  furosemide (LASIX) 20 MG tablet TAKE 1 TABLET BY MOUTH TWICE DAILY. 11/03/15   [provider]  traMADol (ULTRAM) 50 MG tablet Take 50 mg by mouth every 6 (six) hours as needed.    [provider]      PHYSICAL EXAMINATION:   VITAL SIGNS: Blood pressure (!) 149/81, pulse (!) 48, temperature 97.9 F (36.6 C), temperature  source Oral, resp. rate 16, height 5\' 5"  (1.651 m), weight 54 kg, SpO2 99 %.  GENERAL:  85 y.o.-year-old patient lying in the bed with no acute distress.  EYES: Pupils equal, round, reactive to light and accommodation. No scleral icterus. Extraocular muscles intact.  HEENT: Head atraumatic, normocephalic. Oropharynx and nasopharynx clear.  NECK:  Supple, no jugular venous distention. No thyroid enlargement, no tenderness.  LUNGS: Normal breath sounds bilaterally, no wheezing, rales,rhonchi or crepitation. No use of accessory muscles of respiration.  CARDIOVASCULAR: S1, S2 normal. No murmurs, rubs, or gallops.  ABDOMEN: Soft, nontender, nondistended. Bowel sounds present. No organomegaly or mass.  EXTREMITIES: No pedal edema, cyanosis, or clubbing.  NEUROLOGIC: Cranial nerves II through XII are intact. Muscle strength 5/5 in all extremities. Sensation intact. Gait not checked.  PSYCHIATRIC: The patient is alert and oriented x 3.  SKIN: No obvious rash, lesion, or ulcer.   LABORATORY PANEL:   CBC Recent Labs  Lab 11/03/18 1257  WBC 7.7  HGB 11.1*  HCT 35.1*  PLT 296  MCV 94.1  MCH 29.8  MCHC 31.6  RDW 13.2    ------------------------------------------------------------------------------------------------------------------  Chemistries  Recent Labs  Lab 11/03/18 1257  NA 137  K 4.2  CL 104  CO2 24  GLUCOSE 103*  BUN 15  CREATININE 0.90  CALCIUM 7.9*  AST 35  ALT 23  ALKPHOS 90  BILITOT 0.5   ------------------------------------------------------------------------------------------------------------------ estimated creatinine clearance is 40.4 mL/min (by C-G formula based on SCr of 0.9 mg/dL). ------------------------------------------------------------------------------------------------------------------ No results for input(s): TSH, T4TOTAL, T3FREE, THYROIDAB in the last 72 hours.  Invalid input(s): FREET3   Coagulation profile No results for input(s): INR, PROTIME in the last 168 hours. ------------------------------------------------------------------------------------------------------------------- No results for input(s): DDIMER in the last 72 hours. -------------------------------------------------------------------------------------------------------------------  Cardiac Enzymes Recent Labs  Lab 11/03/18 1257  TROPONINI <0.03   ------------------------------------------------------------------------------------------------------------------ Invalid input(s): POCBNP  ---------------------------------------------------------------------------------------------------------------  Urinalysis    Component Value Date/Time   COLORURINE STRAW (A) 11/03/2018 1521   APPEARANCEUR CLEAR (A) 11/03/2018 1521   APPEARANCEUR Clear 01/08/2015 1357   LABSPEC 1.012 11/03/2018 1521   LABSPEC 1.008 01/08/2015 1357   PHURINE 7.0 11/03/2018 1521   GLUCOSEU NEGATIVE 11/03/2018 1521   GLUCOSEU Negative 01/08/2015 1357   HGBUR NEGATIVE 11/03/2018 1521   BILIRUBINUR NEGATIVE 11/03/2018 1521   BILIRUBINUR neg 04/06/2018 1040   BILIRUBINUR Negative 01/08/2015 1357   KETONESUR  NEGATIVE 11/03/2018 1521   PROTEINUR NEGATIVE 11/03/2018 1521   UROBILINOGEN 0.2 04/06/2018 1040   NITRITE NEGATIVE 11/03/2018 1521   LEUKOCYTESUR SMALL (A) 11/03/2018 1521   LEUKOCYTESUR Negative 01/08/2015 1357     RADIOLOGY: Dg Chest 2 View  Result Date: 11/03/2018 CLINICAL DATA:  Dyspnea.  Hypoxia.  Syncope this morning. EXAM: CHEST - 2 VIEW COMPARISON:  Chest x-rays dated 12/28/2017 and 01/03/2015, PET-CT dated 01/17/2018 and chest CT dated 07/04/2017 FINDINGS: There are numerous bilateral pulmonary nodules which appears essentially unchanged since the PET-CT scan dated 08/07/2018. The patient has numerous pulmonary nodules visible on the PET-CT there are not visible on chest x-ray. Heart size and vascularity are normal. Aortic atherosclerosis. No infiltrates or effusions. Fairly severe thoracolumbar scoliosis with convexity to the right. Small hiatal hernia. No acute bone abnormality. IMPRESSION: No acute abnormality. Numerous stable bilateral pulmonary nodules consistent with metastatic disease. Aortic Atherosclerosis (ICD10-I70.0). Electronically Signed   By: Lorriane Shire M.D.   On: 11/03/2018 14:01   Ct Angio Chest Pe W And/or Wo Contrast  Result Date: 11/03/2018 CLINICAL DATA:  Near syncope today. Feeling  lightheaded. History of thyroid cancer with lung metastasis. EXAM: CT ANGIOGRAPHY CHEST WITH CONTRAST TECHNIQUE: Multidetector CT imaging of the chest was performed using the standard protocol during bolus administration of intravenous contrast. Multiplanar CT image reconstructions and MIPs were obtained to evaluate the vascular anatomy. CONTRAST:  27mL OMNIPAQUE IOHEXOL 350 MG/ML SOLN COMPARISON:  July 04, 2017 FINDINGS: Cardiovascular: Satisfactory opacification of the pulmonary arteries to the segmental level. No evidence of pulmonary embolism. Normal heart size. No pericardial effusion. Mediastinum/Nodes: No enlarged mediastinal, hilar, or axillary lymph nodes. Thyroid gland,  trachea, and esophagus demonstrate no significant findings. Lungs/Pleura: There are multiple bilateral pulmonary nodules consistent with patient's known thyroid metastasis to the lungs. Which are viewed nodules are unchanged compared prior exam. However, the largest nodule in the left lower lobe is increased in size compared to prior exam of 2018 currently measuring 1.6 x 1.7 cm. There is no pleural effusion. Upper Abdomen: There is a cyst in the dome of the liver unchanged compared to prior exam. Moderate hiatal hernia is identified. Musculoskeletal: No definite focal discrete lytic or blastic lesions are identified. Degenerative joint changes of the spine. Review of the MIP images confirms the above findings. IMPRESSION: No pulmonary embolus. Multiple pulmonary nodules in both lungs consistent with known metastasis. Majority of the nodules are not significantly changed compared to prior exam. However, the largest nodule in the left lower lobe is increased in size currently measuring 1.6 x 1.7 cm. Electronically Signed   By: Abelardo Diesel M.D.   On: 11/03/2018 15:05    EKG: Orders placed or performed during the hospital encounter of 11/03/18  . ED EKG  . ED EKG    IMPRESSION AND PLAN:  * Syncopal episode   Monitor on tele.   Echo, TSH, Mg   Dr. Ubaldo Glassing for consult- called.  * Prolonged QT   Hold cipro- UIA negative here, Check Ur cx now.   Dr. Ubaldo Glassing to see.   Check TSH, Mg  * Hypothyroidism- s/p thyroid surgery due to cancer   COnt levothyroxine  * COPD    No exacerbation   Cont Inhalers  * Lung nodules    Due to Old cancers, known to her.   Per CT- one nodule is bigger.   Advised to follow with her cancer doctor ( Dr. Mike Gip)   All the records are reviewed and case discussed with ED provider. Management plans discussed with the patient, family and they are in agreement.  CODE STATUS: DNR Code Status History    Date Active Date Inactive Code Status Order ID Comments User Context    05/19/2015 1612 05/19/2015 2013 Full Code [id number]  Dereck Leep, MD Inpatient     Son in room during my visit.  TOTAL TIME TAKING CARE OF THIS PATIENT: 45 minutes.    [name] M.D on 11/03/2018   Between 7am to 6pm - Pager - [phone]  After 6pm go to [email or web address] - password [email or web address] [place]  Office  [phone]  CC: Primary care physician; Idelle Crouch, MD   Note: This dictation was prepared with Dragon dictation along with smaller phrase technology. Any transcriptional errors that result from this process are unintentional.

## 2018-11-03 NOTE — Consult Note (Signed)
Cardiology Consultation Note    Patient ID: Laura Walters, MRN: [id number], DOB/AGE: 83-07-1933 83 y.o. Admit date: 11/03/2018   Date of Consult: 11/03/2018 Primary Physician: Idelle Crouch, MD Primary Cardiologist: Dr. Ubaldo Glassing  Chief Complaint: syncope Reason for Consultation: syncopoe Requesting MD: Dr. Anselm Jungling  HPI: Laura Walters is a 85 y.o. female with history of idiopathic cardiomyopathy with an EF near normal however, hypertension, hyperlipidemia who was admitted after presenting to the emergency room with complaints of several episodes of syncope.  She was last seen in our office in October of last year.  She had an episode of syncope with a UTI back in April of last year.  She is being followed in [place] for metastatic papillary thyroid carcinoma and pituitary macroadenoma.  She has had a history of sinus rhythm sinus bradycardia with a prolonged QT interval.  She recently was placed on ciprofloxacin and had several syncopal episodes.  Etiology is unclear.  She is currently hemodynamically stable with relatively bradycardic with a QT interval of 502.  She has ruled out for myocardial infarction.  Past Medical History:  Diagnosis Date  . Arthritis   . Asthma   . Back pain   . Cancer of thyroid ([place]) 03/30/2015  . Cardiomyopathy ([place])    idiopathic  . Chest pain    non cardiac  . Chokes    easily   swallow test negative  . Constipation   . DDD (degenerative disc disease), lumbar   . Disc disorder   . GERD (gastroesophageal reflux disease)   . H/O wheezing    advair as needed  . Hypertension   . Hypothyroidism   . IBS (irritable bowel syndrome)   . Neuropathy   . Nocturia   . RAD (reactive airway disease)   . Rectocele   . Seasonal allergies   . Spinal stenosis   . Urge incontinence   . Vaginal atrophy   . Vaginal polyp       Surgical History:  Past Surgical History:  Procedure Laterality Date  . BREAST BIOPSY Right 86 and 92   2 bx-neg  . CATARACT EXTRACTION  W/PHACO Right 05/19/2016   Procedure: CATARACT EXTRACTION PHACO AND INTRAOCULAR LENS PLACEMENT (IOC);  Surgeon: Estill Cotta, MD;  Location: [facility] ORS;  Service: Ophthalmology;  Laterality: Right;  Lot# [id number] [place]:   1:25.3 AP%:  24.9% CDE:  40.11  . CATARACT EXTRACTION W/PHACO Left 08/11/2016   Procedure: CATARACT EXTRACTION PHACO AND INTRAOCULAR LENS PLACEMENT (IOC);  Surgeon: Estill Cotta, MD;  Location: [facility] ORS;  Service: Ophthalmology;  Laterality: Left;  [place] 1.24 AP% 26.1 CDE 37.79 FLUID PACK LOT # [id number]  . DILATION AND CURETTAGE OF UTERUS    . HEMORROIDECTOMY     x 2  . JOINT REPLACEMENT     left hip  . KNEE ARTHROPLASTY Right 01/05/2016   Procedure: COMPUTER ASSISTED TOTAL KNEE ARTHROPLASTY;  Surgeon: Dereck Leep, MD;  Location: [facility] ORS;  Service: Orthopedics;  Laterality: Right;  . KNEE ARTHROSCOPY Right 05/19/2015   Procedure: Right knee arthrosocpy medail and lateral menisectomy, chondroplasty ;  Surgeon: Dereck Leep, MD;  Location: [facility] ORS;  Service: Orthopedics;  Laterality: Right;  . Left total hip arthroplasty    . PAROTIDECTOMY    . PITUITARY SURGERY    . THYROID SURGERY     bx  . THYROIDECTOMY       Home Meds: Prior to Admission medications   Medication Sig Start Date End Date Taking?  Authorizing Provider  ciprofloxacin (CIPRO) 500 MG tablet Take 500 mg by mouth 2 (two) times daily.   Yes [provider]  docusate sodium (COLACE) 100 MG capsule Take 100-200 mg by mouth 2 (two) times daily.    Yes [provider]  DULoxetine (CYMBALTA) 60 MG capsule Take 60 mg by mouth daily.  02/02/16  Yes [provider]  ferrous sulfate 325 (65 FE) MG tablet Take 325 mg by mouth daily with breakfast.    Yes [provider]  Fluticasone-Salmeterol (ADVAIR DISKUS) 250-50 MCG/DOSE AEPB Inhale 1 puff into the lungs 2 (two) times daily.   Yes [provider]  gabapentin (NEURONTIN) 400 MG capsule Take 800 mg by mouth 2 (two)  times daily.  02/09/16  Yes [provider]  levothyroxine (SYNTHROID, LEVOTHROID) 88 MCG tablet  10/04/17  Yes [provider]  MAGNESIUM PO Take 250 mg by mouth daily.    Yes [provider]  Multiple Vitamins-Minerals (PRESERVISION AREDS 2) CAPS Take 1 capsule by mouth 2 (two) times daily.   Yes [provider]  omeprazole (PRILOSEC) 40 MG capsule Take 40 mg by mouth daily.  02/16/16  Yes [provider]  potassium chloride (K-DUR,KLOR-CON) 10 MEQ tablet Take 10 mEq by mouth daily.    Yes [provider]  diphenhydrAMINE (BENADRYL) 25 MG tablet Take 25 mg by mouth every 6 (six) hours as needed.    [provider]  furosemide (LASIX) 20 MG tablet TAKE 1 TABLET BY MOUTH TWICE DAILY. 11/03/15   [provider]  traMADol (ULTRAM) 50 MG tablet Take 50 mg by mouth every 6 (six) hours as needed.    [provider]    Inpatient Medications:  . docusate sodium  100-200 mg Oral BID  . DULoxetine  60 mg Oral Daily  . [START ON 11/04/2018] ferrous sulfate  325 mg Oral Q breakfast  . furosemide  20 mg Oral BID  . gabapentin  800 mg Oral BID  . heparin  5,000 Units Subcutaneous Q8H  . [START ON 11/04/2018] levothyroxine  88 mcg Oral Q0600  . [START ON 11/04/2018] magnesium oxide  200 mg Oral Daily  . mometasone-formoterol  2 puff Inhalation BID  . multivitamin-lutein  1 capsule Oral BID  . pantoprazole  40 mg Oral Daily  . [START ON 11/04/2018] potassium chloride  10 mEq Oral Daily  . sodium chloride flush  3 mL Intravenous Once     Allergies:  Allergies  Allergen Reactions  . Acetaminophen-Codeine Other (See Comments)  . Aleve [Naproxen] Other (See Comments)    Causes dizziness  . Penicillins Hives    Has patient had a PCN reaction causing immediate rash, facial/tongue/throat swelling, SOB or lightheadedness with hypotension: hives Has patient had a PCN reaction causing severe rash involving mucus membranes or skin necrosis:  no Has patient had a PCN reaction that required hospitalization no Has patient had a PCN reaction occurring within the last 10 years: no If all of the above answers are "NO", then may proceed with Cephalosporin use.  . Sulfa Antibiotics Hives  . Codeine Hives and Itching    Social History   Socioeconomic History  . Marital status: Widowed    Spouse name: Not on file  . Number of children: Not on file  . Years of education: Not on file  . Highest education level: Not on file  Occupational History  . Not on file  Social Needs  . Financial resource strain: Not on file  .  Food insecurity:    Worry: Not on file    Inability: Not on file  . Transportation needs:    Medical: Not on file    Non-medical: Not on file  Tobacco Use  . Smoking status: Never Smoker  . Smokeless tobacco: Never Used  Substance and Sexual Activity  . Alcohol use: No  . Drug use: No  . Sexual activity: Not Currently    Birth control/protection: Post-menopausal  Lifestyle  . Physical activity:    Days per week: Not on file    Minutes per session: Not on file  . Stress: Not on file  Relationships  . Social connections:    Talks on phone: Not on file    Gets together: Not on file    Attends religious service: Not on file    Active member of club or organization: Not on file    Attends meetings of clubs or organizations: Not on file    Relationship status: Not on file  . Intimate partner violence:    Fear of current or ex partner: Not on file    Emotionally abused: Not on file    Physically abused: Not on file    Forced sexual activity: Not on file  Other Topics Concern  . Not on file  Social History Narrative  . Not on file     Family History  Problem Relation Age of Onset  . Heart disease Father   . Diabetes Paternal Uncle   . Cancer Neg Hx   . Breast cancer Neg Hx      Review of Systems: A 12-system review of systems was performed and is negative except as noted in the  HPI.  Labs: Recent Labs    11/03/18 1257  TROPONINI <0.03   Lab Results  Component Value Date   WBC 7.7 11/03/2018   HGB 11.1 (L) 11/03/2018   HCT 35.1 (L) 11/03/2018   MCV 94.1 11/03/2018   PLT 296 11/03/2018    Recent Labs  Lab 11/03/18 1257  NA 137  K 4.2  CL 104  CO2 24  BUN 15  CREATININE 0.90  CALCIUM 7.9*  PROT 6.2*  BILITOT 0.5  ALKPHOS 90  ALT 23  AST 35  GLUCOSE 103*   No results found for: CHOL, HDL, LDLCALC, TRIG No results found for: DDIMER  Radiology/Studies:  Dg Chest 2 View  Result Date: 11/03/2018 CLINICAL DATA:  Dyspnea.  Hypoxia.  Syncope this morning. EXAM: CHEST - 2 VIEW COMPARISON:  Chest x-rays dated 12/28/2017 and 01/03/2015, PET-CT dated 01/17/2018 and chest CT dated 07/04/2017 FINDINGS: There are numerous bilateral pulmonary nodules which appears essentially unchanged since the PET-CT scan dated 08/07/2018. The patient has numerous pulmonary nodules visible on the PET-CT there are not visible on chest x-ray. Heart size and vascularity are normal. Aortic atherosclerosis. No infiltrates or effusions. Fairly severe thoracolumbar scoliosis with convexity to the right. Small hiatal hernia. No acute bone abnormality. IMPRESSION: No acute abnormality. Numerous stable bilateral pulmonary nodules consistent with metastatic disease. Aortic Atherosclerosis (ICD10-I70.0). Electronically Signed   By: Lorriane Shire M.D.   On: 11/03/2018 14:01   Ct Angio Chest Pe W And/or Wo Contrast  Result Date: 11/03/2018 CLINICAL DATA:  Near syncope today. Feeling lightheaded. History of thyroid cancer with lung metastasis. EXAM: CT ANGIOGRAPHY CHEST WITH CONTRAST TECHNIQUE: Multidetector CT imaging of the chest was performed using the standard protocol during bolus administration of intravenous contrast. Multiplanar CT image reconstructions and MIPs were obtained to evaluate the  vascular anatomy. CONTRAST:  26mL OMNIPAQUE IOHEXOL 350 MG/ML SOLN COMPARISON:  July 04, 2017  FINDINGS: Cardiovascular: Satisfactory opacification of the pulmonary arteries to the segmental level. No evidence of pulmonary embolism. Normal heart size. No pericardial effusion. Mediastinum/Nodes: No enlarged mediastinal, hilar, or axillary lymph nodes. Thyroid gland, trachea, and esophagus demonstrate no significant findings. Lungs/Pleura: There are multiple bilateral pulmonary nodules consistent with patient's known thyroid metastasis to the lungs. Which are viewed nodules are unchanged compared prior exam. However, the largest nodule in the left lower lobe is increased in size compared to prior exam of 2018 currently measuring 1.6 x 1.7 cm. There is no pleural effusion. Upper Abdomen: There is a cyst in the dome of the liver unchanged compared to prior exam. Moderate hiatal hernia is identified. Musculoskeletal: No definite focal discrete lytic or blastic lesions are identified. Degenerative joint changes of the spine. Review of the MIP images confirms the above findings. IMPRESSION: No pulmonary embolus. Multiple pulmonary nodules in both lungs consistent with known metastasis. Majority of the nodules are not significantly changed compared to prior exam. However, the largest nodule in the left lower lobe is increased in size currently measuring 1.6 x 1.7 cm. Electronically Signed   By: Abelardo Diesel M.D.   On: 11/03/2018 15:05    Wt Readings from Last 3 Encounters:  11/03/18 54 kg  11/02/18 54.1 kg  10/11/18 54.3 kg    EKG: Sinus bradycardia  Physical Exam:  Blood pressure 139/69, pulse 68, temperature (!) 97.4 F (36.3 C), temperature source Oral, resp. rate 12, height 5\' 5"  (1.651 m), weight 54 kg, SpO2 100 %. Body mass index is 19.81 kg/m. General: Well developed, well nourished, in no acute distress. Head: Normocephalic, atraumatic, sclera non-icteric, no xanthomas, nares are without discharge.  Neck: Negative for carotid bruits. JVD not elevated. Lungs: Clear bilaterally to  auscultation without wheezes, rales, or rhonchi. Breathing is unlabored. Heart: RRR with S1 S2. No murmurs, rubs, or gallops appreciated. Abdomen: Soft, non-tender, non-distended with normoactive bowel sounds. No hepatomegaly. No rebound/guarding. No obvious abdominal masses. Msk:  Strength and tone appear normal for age. Extremities: No clubbing or cyanosis. No edema.  Distal pedal pulses are 2+ and equal bilaterally. Neuro: Alert and oriented X 3. No facial asymmetry. No focal deficit. Moves all extremities spontaneously. Psych:  Responds to questions appropriately with a normal affect.     Assessment and Plan  85 year old female with history of relative bradycardia who was admitted after several episodes of syncope.  She does have a prolonged QT interval of 502 ms.  There have been no episodes of tacky or prolonged pauses.  Etiology of syncope is unclear.  Will need to follow-up for relative volume depletion versus arrhythmia.  Would agree with admission following on telemetry with further recommendations based on hospital course.  We agree with stopping ciprofloxacin for now given prolonged QT.  Signed, Teodoro Spray MD 11/03/2018, 7:57 PM Pager: [phone]

## 2018-11-03 NOTE — ED Notes (Signed)
Patient transported to CT 

## 2018-11-03 NOTE — ED Triage Notes (Signed)
Pt to ED via EMS. Pt had syncopal episode at hair salon while under hair dryer. Pt states this is the 3rd time in 2 weeks that she has had syncopal episode. Upon ems arrival pt hypotensive at 80/40. Pt BP 128/65 on arrival to ED, pt was given 150 ml iv fluids by ems, 18g established in left forearm. Pt denies any pain or feeling lightheaded. NAD.

## 2018-11-03 NOTE — Progress Notes (Signed)
Family Meeting Note  Advance Directive:yes  Today a meeting took place with the Patient and son.  The following clinical team members were present during this meeting:MD  The following were discussed:Patient's diagnosis: Syncope, Hypothyroidism, Lung nodules, THyroid cancer, Patient's progosis: Unable to determine and Goals for treatment: DNR  Additional follow-up to be provided: Cardiology  Time spent during discussion:20 minutes  [name], MD

## 2018-11-04 ENCOUNTER — Observation Stay
Admit: 2018-11-04 | Discharge: 2018-11-04 | Disposition: A | Discharge status: Home / Self Care | Payer: Medicare Other | Attending: Internal Medicine | Admitting: Internal Medicine

## 2018-11-04 ENCOUNTER — Inpatient Hospital Stay: Payer: Medicare Other

## 2018-11-04 DIAGNOSIS — M5136 Other intervertebral disc degeneration, lumbar region: Secondary | ICD-10-CM | POA: Diagnosis present

## 2018-11-04 DIAGNOSIS — R9431 Abnormal electrocardiogram [ECG] [EKG]: Secondary | ICD-10-CM | POA: Diagnosis present

## 2018-11-04 DIAGNOSIS — G629 Polyneuropathy, unspecified: Secondary | ICD-10-CM | POA: Diagnosis present

## 2018-11-04 DIAGNOSIS — Z9841 Cataract extraction status, right eye: Secondary | ICD-10-CM | POA: Diagnosis not present

## 2018-11-04 DIAGNOSIS — Z880 Allergy status to penicillin: Secondary | ICD-10-CM | POA: Diagnosis not present

## 2018-11-04 DIAGNOSIS — K219 Gastro-esophageal reflux disease without esophagitis: Secondary | ICD-10-CM | POA: Diagnosis present

## 2018-11-04 DIAGNOSIS — Z85118 Personal history of other malignant neoplasm of bronchus and lung: Secondary | ICD-10-CM | POA: Diagnosis not present

## 2018-11-04 DIAGNOSIS — J449 Chronic obstructive pulmonary disease, unspecified: Secondary | ICD-10-CM | POA: Diagnosis present

## 2018-11-04 DIAGNOSIS — Z96651 Presence of right artificial knee joint: Secondary | ICD-10-CM | POA: Diagnosis present

## 2018-11-04 DIAGNOSIS — Z96642 Presence of left artificial hip joint: Secondary | ICD-10-CM | POA: Diagnosis present

## 2018-11-04 DIAGNOSIS — R001 Bradycardia, unspecified: Secondary | ICD-10-CM | POA: Diagnosis present

## 2018-11-04 DIAGNOSIS — Z9842 Cataract extraction status, left eye: Secondary | ICD-10-CM | POA: Diagnosis not present

## 2018-11-04 DIAGNOSIS — I10 Essential (primary) hypertension: Secondary | ICD-10-CM | POA: Diagnosis present

## 2018-11-04 DIAGNOSIS — I4581 Long QT syndrome: Secondary | ICD-10-CM | POA: Diagnosis present

## 2018-11-04 DIAGNOSIS — Z961 Presence of intraocular lens: Secondary | ICD-10-CM | POA: Diagnosis present

## 2018-11-04 DIAGNOSIS — E890 Postprocedural hypothyroidism: Secondary | ICD-10-CM | POA: Diagnosis present

## 2018-11-04 DIAGNOSIS — K589 Irritable bowel syndrome without diarrhea: Secondary | ICD-10-CM | POA: Diagnosis present

## 2018-11-04 DIAGNOSIS — M1990 Unspecified osteoarthritis, unspecified site: Secondary | ICD-10-CM | POA: Diagnosis present

## 2018-11-04 DIAGNOSIS — I428 Other cardiomyopathies: Secondary | ICD-10-CM | POA: Diagnosis present

## 2018-11-04 DIAGNOSIS — R55 Syncope and collapse: Secondary | ICD-10-CM | POA: Diagnosis present

## 2018-11-04 DIAGNOSIS — Z85850 Personal history of malignant neoplasm of thyroid: Secondary | ICD-10-CM | POA: Diagnosis not present

## 2018-11-04 DIAGNOSIS — Z888 Allergy status to other drugs, medicaments and biological substances status: Secondary | ICD-10-CM | POA: Diagnosis not present

## 2018-11-04 DIAGNOSIS — N390 Urinary tract infection, site not specified: Secondary | ICD-10-CM | POA: Diagnosis present

## 2018-11-04 DIAGNOSIS — Z885 Allergy status to narcotic agent status: Secondary | ICD-10-CM | POA: Diagnosis not present

## 2018-11-04 DIAGNOSIS — Z882 Allergy status to sulfonamides status: Secondary | ICD-10-CM | POA: Diagnosis not present

## 2018-11-04 LAB — BASIC METABOLIC PANEL
Anion gap: 7 (ref 5–15)
BUN: 13 mg/dL (ref 8–23)
CO2: 24 mmol/L (ref 22–32)
Calcium: 7.9 mg/dL — ABNORMAL LOW (ref 8.9–10.3)
Chloride: 108 mmol/L (ref 98–111)
Creatinine, Ser: 0.8 mg/dL (ref 0.44–1.00)
GFR calc Af Amer: >60 mL/min (ref >60)
GFR calc non Af Amer: >60 mL/min (ref >60)
Glucose, Bld: 86 mg/dL (ref 70–99)
Potassium: 4.1 mmol/L (ref 3.5–5.1)
Sodium: 139 mmol/L (ref 135–145)

## 2018-11-04 LAB — ECHOCARDIOGRAM COMPLETE
Height: 65 in
WEIGHTICAEL: 1904.77 [oz_av]

## 2018-11-04 LAB — CBC
HEMATOCRIT: 34.7 % — AB (ref 36.0–46.0)
Hemoglobin: 11.3 g/dL — ABNORMAL LOW (ref 12.0–15.0)
MCH: 30.6 pg (ref 26.0–34.0)
MCHC: 32.6 g/dL (ref 30.0–36.0)
MCV: 94 fL (ref 80.0–100.0)
Platelets: 267 10*3/uL (ref 150–400)
RBC: 3.69 MIL/uL — ABNORMAL LOW (ref 3.87–5.11)
RDW: 13.1 % (ref 11.5–15.5)
WBC: 6.2 10*3/uL (ref 4.0–10.5)
nRBC: 0 % (ref 0.0–0.2)

## 2018-11-04 LAB — URINE CULTURE: Culture: <10000 — AB

## 2018-11-04 NOTE — Progress Notes (Signed)
*  PRELIMINARY RESULTS* Echocardiogram 2D Echocardiogram has been performed.  Laura Walters 11/04/2018, 8:20 AM

## 2018-11-04 NOTE — Discharge Instructions (Signed)
Follow-up with primary care physician in 2 to 3 days Follow-up with Dr. Ubaldo Glassing cardiology in a week Follow-up with Dr. Mike Gip as recommended or in 1 to 2 weeks

## 2018-11-04 NOTE — Progress Notes (Signed)
Reviewed AVS with pt and daughter, both verbalized understanding

## 2018-11-04 NOTE — Care Management Obs Status (Signed)
[facility] NOTIFICATION   Patient Details  Name: Laura Walters MRN: [id number] Date of Birth: [birth year]   Medicare Observation Status Notification Given:  Yes    Gordon Carlson A [name], RN 11/04/2018, 10:27 AM
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
## 2018-11-04 NOTE — Progress Notes (Signed)
Patient Name: Laura Walters Date of Encounter: 11/04/2018  Hospital Problem List     Principal Problem:   Syncope    Patient Profile     Feels well.  No further lightheadedness.  No bradycardia.  No pauses.  Patient admitted after several episodes of syncope.  Subjective   Feels well with no further symptoms desires going home.  Inpatient Medications    . docusate sodium  100-200 mg Oral BID  . DULoxetine  60 mg Oral Daily  . ferrous sulfate  325 mg Oral Q breakfast  . furosemide  20 mg Oral BID  . gabapentin  800 mg Oral BID  . heparin  5,000 Units Subcutaneous Q8H  . levothyroxine  88 mcg Oral Q0600  . magnesium oxide  200 mg Oral Daily  . mometasone-formoterol  2 puff Inhalation BID  . multivitamin-lutein  1 capsule Oral BID  . pantoprazole  40 mg Oral Daily  . potassium chloride  10 mEq Oral Daily  . sodium chloride flush  3 mL Intravenous Once    Vital Signs    Vitals:   11/03/18 1853 11/03/18 2012 11/04/18 0500 11/04/18 0849  BP: 139/69 (!) 120/58 129/87 (!) 147/70  Pulse: 68 64 (!) 56 63  Resp:  16 14   Temp: (!) 97.4 F (36.3 C) 98.3 F (36.8 C) 98.2 F (36.8 C)   TempSrc: Oral Oral Oral   SpO2: 100% 98% 95% 95%  Weight:      Height:        Intake/Output Summary (Last 24 hours) at 11/04/2018 1407 Last data filed at 11/04/2018 1400 Gross per 24 hour  Intake 840 ml  Output -  Net 840 ml   Filed Weights   11/03/18 1233  Weight: 54 kg    Physical Exam    GEN: Well nourished, well developed, in no acute distress.  HEENT: normal.  Neck: Supple, no JVD, carotid bruits, or masses. Cardiac: RRR, no murmurs, rubs, or gallops. No clubbing, cyanosis, edema.  Radials/DP/PT 2+ and equal bilaterally.  Respiratory:  Respirations regular and unlabored, clear to auscultation bilaterally. GI: Soft, nontender, nondistended, BS + x 4. MS: no deformity or atrophy. Skin: warm and dry, no rash. Neuro:  Strength and sensation are intact. Psych: Normal  affect.  Labs    CBC Recent Labs    11/03/18 1257 11/04/18 0552  WBC 7.7 6.2  HGB 11.1* 11.3*  HCT 35.1* 34.7*  MCV 94.1 94.0  PLT 296 109   Basic Metabolic Panel Recent Labs    11/03/18 1257 11/04/18 0552  NA 137 139  K 4.2 4.1  CL 104 108  CO2 24 24  GLUCOSE 103* 86  BUN 15 13  CREATININE 0.90 0.80  CALCIUM 7.9* 7.9*  MG 2.3  --    Liver Function Tests Recent Labs    11/03/18 1257  AST 35  ALT 23  ALKPHOS 90  BILITOT 0.5  PROT 6.2*  ALBUMIN 3.3*   No results for input(s): LIPASE, AMYLASE in the last 72 hours. Cardiac Enzymes Recent Labs    11/03/18 1257  TROPONINI <0.03   BNP No results for input(s): BNP in the last 72 hours. D-Dimer No results for input(s): DDIMER in the last 72 hours. Hemoglobin A1C No results for input(s): HGBA1C in the last 72 hours. Fasting Lipid Panel No results for input(s): CHOL, HDL, LDLCALC, TRIG, CHOLHDL, LDLDIRECT in the last 72 hours. Thyroid Function Tests Recent Labs    11/03/18 1257  TSH  0.199*    Telemetry    Sinus bradycardia with no pauses  ECG    Sinus bradycardia  Radiology    Dg Chest 2 View  Result Date: 11/03/2018 CLINICAL DATA:  Dyspnea.  Hypoxia.  Syncope this morning. EXAM: CHEST - 2 VIEW COMPARISON:  Chest x-rays dated 12/28/2017 and 01/03/2015, PET-CT dated 01/17/2018 and chest CT dated 07/04/2017 FINDINGS: There are numerous bilateral pulmonary nodules which appears essentially unchanged since the PET-CT scan dated 08/07/2018. The patient has numerous pulmonary nodules visible on the PET-CT there are not visible on chest x-ray. Heart size and vascularity are normal. Aortic atherosclerosis. No infiltrates or effusions. Fairly severe thoracolumbar scoliosis with convexity to the right. Small hiatal hernia. No acute bone abnormality. IMPRESSION: No acute abnormality. Numerous stable bilateral pulmonary nodules consistent with metastatic disease. Aortic Atherosclerosis (ICD10-I70.0). Electronically  Signed   By: Lorriane Shire M.D.   On: 11/03/2018 14:01   Ct Head Wo Contrast  Result Date: 11/04/2018 CLINICAL DATA:  Syncope, simple, with normal neuro exam EXAM: CT HEAD WITHOUT CONTRAST TECHNIQUE: Contiguous axial images were obtained from the base of the skull through the vertex without intravenous contrast. COMPARISON:  Head CT 08/07/2018 FINDINGS: Brain: No acute infarct, hemorrhage, hydrocephalus, or collection. Age normal brain volume. Isodense mass in the left sella, left cavernous sinus region, left suprasellar cistern, and left Meckel's cave region. This causes mass effect on the inferior left cerebrum. Patient has history of pituitary tumor. Mild chronic small vessel ischemia in the cerebral white matter. Vascular: No hyperdense vessel or unexpected calcification. Skull: No acute or aggressive finding. Sinuses/Orbits: Changes of trans-sphenoidal surgery. Partial coverage of the sinuses shows extensive ethmoid and maxillary opacification with sclerotic wall thickening from chronic inflammation. IMPRESSION: 1. No acute finding. 2. Grossly similar appearance of sellar, suprasellar, and left cavernous sinus region mass when compared to a 2018 brain MRI. Patient has history of pituitary tumor. Electronically Signed   By: Monte Fantasia M.D.   On: 11/04/2018 11:15   Ct Angio Chest Pe W And/or Wo Contrast  Result Date: 11/03/2018 CLINICAL DATA:  Near syncope today. Feeling lightheaded. History of thyroid cancer with lung metastasis. EXAM: CT ANGIOGRAPHY CHEST WITH CONTRAST TECHNIQUE: Multidetector CT imaging of the chest was performed using the standard protocol during bolus administration of intravenous contrast. Multiplanar CT image reconstructions and MIPs were obtained to evaluate the vascular anatomy. CONTRAST:  20mL OMNIPAQUE IOHEXOL 350 MG/ML SOLN COMPARISON:  July 04, 2017 FINDINGS: Cardiovascular: Satisfactory opacification of the pulmonary arteries to the segmental level. No evidence of  pulmonary embolism. Normal heart size. No pericardial effusion. Mediastinum/Nodes: No enlarged mediastinal, hilar, or axillary lymph nodes. Thyroid gland, trachea, and esophagus demonstrate no significant findings. Lungs/Pleura: There are multiple bilateral pulmonary nodules consistent with patient's known thyroid metastasis to the lungs. Which are viewed nodules are unchanged compared prior exam. However, the largest nodule in the left lower lobe is increased in size compared to prior exam of 2018 currently measuring 1.6 x 1.7 cm. There is no pleural effusion. Upper Abdomen: There is a cyst in the dome of the liver unchanged compared to prior exam. Moderate hiatal hernia is identified. Musculoskeletal: No definite focal discrete lytic or blastic lesions are identified. Degenerative joint changes of the spine. Review of the MIP images confirms the above findings. IMPRESSION: No pulmonary embolus. Multiple pulmonary nodules in both lungs consistent with known metastasis. Majority of the nodules are not significantly changed compared to prior exam. However, the largest nodule in the left lower  lobe is increased in size currently measuring 1.6 x 1.7 cm. Electronically Signed   By: Abelardo Diesel M.D.   On: 11/03/2018 15:05    Assessment & Plan    Patient with syncopal episodes.  May have been secondary to hypovolemia versus bradycardia.  No profound bradycardia noted.  No pauses.  Would ambulate patient if stable consider discharge.  Will need outpatient follow-up fairly acutely for Holter monitor placement to guide further therapy.  Signed, Javier Docker Michaella Imai MD 11/04/2018, 2:07 PM  Pager: [phone]

## 2018-11-06 ENCOUNTER — Other Ambulatory Visit: Payer: Self-pay | Admitting: Internal Medicine

## 2018-11-06 DIAGNOSIS — Z1231 Encounter for screening mammogram for malignant neoplasm of breast: Secondary | ICD-10-CM

## 2018-11-18 ENCOUNTER — Inpatient Hospital Stay
Admission: EM | Admit: 2018-11-18 | Discharge: 2018-11-19 | DRG: 281 | Disposition: A | Discharge status: Home / Self Care | Payer: Medicare Other | Attending: Internal Medicine | Admitting: Internal Medicine

## 2018-11-18 ENCOUNTER — Other Ambulatory Visit: Payer: Self-pay

## 2018-11-18 ENCOUNTER — Encounter: Payer: Self-pay | Admitting: Emergency Medicine

## 2018-11-18 DIAGNOSIS — I951 Orthostatic hypotension: Secondary | ICD-10-CM | POA: Diagnosis present

## 2018-11-18 DIAGNOSIS — Z66 Do not resuscitate: Secondary | ICD-10-CM | POA: Diagnosis present

## 2018-11-18 DIAGNOSIS — J45909 Unspecified asthma, uncomplicated: Secondary | ICD-10-CM | POA: Diagnosis present

## 2018-11-18 DIAGNOSIS — E890 Postprocedural hypothyroidism: Secondary | ICD-10-CM | POA: Diagnosis present

## 2018-11-18 DIAGNOSIS — G629 Polyneuropathy, unspecified: Secondary | ICD-10-CM | POA: Diagnosis present

## 2018-11-18 DIAGNOSIS — K581 Irritable bowel syndrome with constipation: Secondary | ICD-10-CM | POA: Diagnosis present

## 2018-11-18 DIAGNOSIS — K219 Gastro-esophageal reflux disease without esophagitis: Secondary | ICD-10-CM | POA: Diagnosis present

## 2018-11-18 DIAGNOSIS — Z886 Allergy status to analgesic agent status: Secondary | ICD-10-CM

## 2018-11-18 DIAGNOSIS — D649 Anemia, unspecified: Secondary | ICD-10-CM | POA: Diagnosis present

## 2018-11-18 DIAGNOSIS — R55 Syncope and collapse: Secondary | ICD-10-CM

## 2018-11-18 DIAGNOSIS — F329 Major depressive disorder, single episode, unspecified: Secondary | ICD-10-CM | POA: Diagnosis present

## 2018-11-18 DIAGNOSIS — Z882 Allergy status to sulfonamides status: Secondary | ICD-10-CM

## 2018-11-18 DIAGNOSIS — Z9089 Acquired absence of other organs: Secondary | ICD-10-CM | POA: Diagnosis not present

## 2018-11-18 DIAGNOSIS — Z885 Allergy status to narcotic agent status: Secondary | ICD-10-CM

## 2018-11-18 DIAGNOSIS — Z79891 Long term (current) use of opiate analgesic: Secondary | ICD-10-CM

## 2018-11-18 DIAGNOSIS — Z79890 Hormone replacement therapy (postmenopausal): Secondary | ICD-10-CM | POA: Diagnosis not present

## 2018-11-18 DIAGNOSIS — Z7951 Long term (current) use of inhaled steroids: Secondary | ICD-10-CM

## 2018-11-18 DIAGNOSIS — Z880 Allergy status to penicillin: Secondary | ICD-10-CM

## 2018-11-18 DIAGNOSIS — I4581 Long QT syndrome: Secondary | ICD-10-CM | POA: Diagnosis present

## 2018-11-18 DIAGNOSIS — I10 Essential (primary) hypertension: Secondary | ICD-10-CM | POA: Diagnosis present

## 2018-11-18 DIAGNOSIS — Z8249 Family history of ischemic heart disease and other diseases of the circulatory system: Secondary | ICD-10-CM

## 2018-11-18 DIAGNOSIS — Z79899 Other long term (current) drug therapy: Secondary | ICD-10-CM | POA: Diagnosis not present

## 2018-11-18 DIAGNOSIS — I214 Non-ST elevation (NSTEMI) myocardial infarction: Secondary | ICD-10-CM | POA: Diagnosis present

## 2018-11-18 DIAGNOSIS — Z85850 Personal history of malignant neoplasm of thyroid: Secondary | ICD-10-CM

## 2018-11-18 DIAGNOSIS — Z833 Family history of diabetes mellitus: Secondary | ICD-10-CM

## 2018-11-18 DIAGNOSIS — I429 Cardiomyopathy, unspecified: Secondary | ICD-10-CM | POA: Diagnosis present

## 2018-11-18 DIAGNOSIS — Z8589 Personal history of malignant neoplasm of other organs and systems: Secondary | ICD-10-CM | POA: Diagnosis not present

## 2018-11-18 LAB — TROPONIN I: Troponin I: 3.36 ng/mL (ref <0.03)

## 2018-11-18 LAB — CBC
HEMATOCRIT: 37 % (ref 36.0–46.0)
Hemoglobin: 11.7 g/dL — ABNORMAL LOW (ref 12.0–15.0)
MCH: 29.6 pg (ref 26.0–34.0)
MCHC: 31.6 g/dL (ref 30.0–36.0)
MCV: 93.7 fL (ref 80.0–100.0)
Platelets: 227 10*3/uL (ref 150–400)
RBC: 3.95 MIL/uL (ref 3.87–5.11)
RDW: 13.3 % (ref 11.5–15.5)
WBC: 8.1 10*3/uL (ref 4.0–10.5)
nRBC: 0 % (ref 0.0–0.2)

## 2018-11-18 LAB — URINALYSIS, COMPLETE (UACMP) WITH MICROSCOPIC
BILIRUBIN URINE: NEGATIVE
Glucose, UA: NEGATIVE mg/dL
Hgb urine dipstick: NEGATIVE
Ketones, ur: NEGATIVE mg/dL
Nitrite: NEGATIVE
PROTEIN: NEGATIVE mg/dL
Specific Gravity, Urine: 1.011 (ref 1.005–1.030)
pH: 7 (ref 5.0–8.0)

## 2018-11-18 LAB — PROTIME-INR
INR: 0.97
Prothrombin Time: 12.8 seconds (ref 11.4–15.2)

## 2018-11-18 LAB — BASIC METABOLIC PANEL
ANION GAP: 7 (ref 5–15)
BUN: 17 mg/dL (ref 8–23)
CO2: 23 mmol/L (ref 22–32)
Calcium: 7.8 mg/dL — ABNORMAL LOW (ref 8.9–10.3)
Chloride: 104 mmol/L (ref 98–111)
Creatinine, Ser: 0.85 mg/dL (ref 0.44–1.00)
GFR calc Af Amer: >60 mL/min (ref >60)
GFR calc non Af Amer: >60 mL/min (ref >60)
Glucose, Bld: 106 mg/dL — ABNORMAL HIGH (ref 70–99)
Potassium: 4.8 mmol/L (ref 3.5–5.1)
Sodium: 134 mmol/L — ABNORMAL LOW (ref 135–145)

## 2018-11-18 LAB — APTT: APTT: 32 s (ref 24–36)

## 2018-11-18 LAB — T4, FREE: Free T4: 1.34 ng/dL (ref 0.82–1.77)

## 2018-11-18 LAB — MAGNESIUM: Magnesium: 2.5 mg/dL — ABNORMAL HIGH (ref 1.7–2.4)

## 2018-11-18 LAB — TSH: TSH: 0.089 u[IU]/mL — ABNORMAL LOW (ref 0.350–4.500)

## 2018-11-18 MED ORDER — HEPARIN BOLUS VIA INFUSION
3200.0000 [IU] | Freq: Once | INTRAVENOUS | Status: AC
  Administered 2018-11-18: 3200 [IU] via INTRAVENOUS
  Filled 2018-11-18: qty 3200

## 2018-11-18 MED ORDER — LORATADINE 10 MG PO TABS
10.0000 mg | ORAL_TABLET | Freq: Every day | ORAL | Status: DC
  Administered 2018-11-19 (×2): 10 mg via ORAL
  Filled 2018-11-18 (×2): qty 1

## 2018-11-18 MED ORDER — HEPARIN (PORCINE) 25000 UT/250ML-% IV SOLN
600.0000 [IU]/h | INTRAVENOUS | Status: DC
  Administered 2018-11-18: 600 [IU]/h via INTRAVENOUS
  Filled 2018-11-18: qty 250

## 2018-11-18 MED ORDER — SODIUM CHLORIDE 0.9% FLUSH
3.0000 mL | Freq: Once | INTRAVENOUS | Status: AC
  Administered 2018-11-18: 3 mL via INTRAVENOUS

## 2018-11-18 NOTE — ED Notes (Signed)
Second set of tubes sent at this time. RN will monitor for results.

## 2018-11-18 NOTE — ED Notes (Signed)
.   Pt is resting, Respirations even and unlabored, NAD. Stretcher lowest postion and locked. Call bell within reach. Denies any needs at this time RN will continue to monitor.  Caregiver at bedside.

## 2018-11-18 NOTE — ED Notes (Signed)
Provider made aware of pts critical trop of 3.36

## 2018-11-18 NOTE — H&P (Signed)
[facility] [place] NAME: Laura Walters    MR#:  [id number]  DATE OF BIRTH:  March 07, 1933  DATE OF ADMISSION:  11/18/2018  PRIMARY CARE PHYSICIAN: Idelle Crouch, MD   REQUESTING/REFERRING PHYSICIAN: Dr. Rudene Re  CHIEF COMPLAINT:   Chief Complaint  Patient presents with  . Loss of Consciousness    HISTORY OF PRESENT ILLNESS:  Laura Walters  is a 85 y.o. female with a known history of hypertension, hypothyroidism, GERD, history of thyroid cancer who was recently in the hospital secondary to syncope thought to be secondary to prolonged QT from patient being on ciprofloxacin, who returns to the hospital after a recurrent syncopal episode and noted to have an elevated troponin of 3.3.  Patient on her previous admission had a Holter monitor which was essentially normal.  Her antibiotics were switched and she was asymptomatic and discharged home.  Today she was baking a cake and became somewhat dizzy and lightheaded and walked herself to her living room and sat down on the couch.  The next thing she remembers was on being on the floor and may be 45 minutes to an hour had expired.  Patient does states she had some left arm pain while she was on the floor and had some diaphoresis and some nausea but no vomiting.  She had the med alert button and EMS was called and she was brought to the ER for further evaluation.  Patient has had no syncopal episodes while here in the ER.  On routine blood work she was noted to have elevated troponin of 3.3.  She presently denies any chest pain, dizziness, nausea vomiting, shortness of breath.  She does say that she has been having worsening exertional dyspnea over the past week to 2 weeks.  PAST MEDICAL HISTORY:   Past Medical History:  Diagnosis Date  . Arthritis   . Asthma   . Back pain   . Cancer of thyroid ([place]) 03/30/2015  . Cardiomyopathy ([place])    idiopathic  . Chest pain    non cardiac  . Chokes    easily   swallow test negative  . Constipation   . DDD (degenerative disc disease), lumbar   . Disc disorder   . GERD (gastroesophageal reflux disease)   . H/O wheezing    advair as needed  . Hypertension   . Hypothyroidism   . IBS (irritable bowel syndrome)   . Neuropathy   . Nocturia   . RAD (reactive airway disease)   . Rectocele   . Seasonal allergies   . Spinal stenosis   . Urge incontinence   . Vaginal atrophy   . Vaginal polyp     PAST SURGICAL HISTORY:   Past Surgical History:  Procedure Laterality Date  . BREAST BIOPSY Right 86 and 92   2 bx-neg  . CATARACT EXTRACTION W/PHACO Right 05/19/2016   Procedure: CATARACT EXTRACTION PHACO AND INTRAOCULAR LENS PLACEMENT (IOC);  Surgeon: Estill Cotta, MD;  Location: [facility] ORS;  Service: Ophthalmology;  Laterality: Right;  Lot# [id number] [place]:   1:25.3 AP%:  24.9% CDE:  40.11  . CATARACT EXTRACTION W/PHACO Left 08/11/2016   Procedure: CATARACT EXTRACTION PHACO AND INTRAOCULAR LENS PLACEMENT (IOC);  Surgeon: Estill Cotta, MD;  Location: [facility] ORS;  Service: Ophthalmology;  Laterality: Left;  [place] 1.24 AP% 26.1 CDE 37.79 FLUID PACK LOT # [id number]  . DILATION AND CURETTAGE OF UTERUS    . HEMORROIDECTOMY     x  2  . JOINT REPLACEMENT     left hip  . KNEE ARTHROPLASTY Right 01/05/2016   Procedure: COMPUTER ASSISTED TOTAL KNEE ARTHROPLASTY;  Surgeon: Dereck Leep, MD;  Location: [facility] ORS;  Service: Orthopedics;  Laterality: Right;  . KNEE ARTHROSCOPY Right 05/19/2015   Procedure: Right knee arthrosocpy medail and lateral menisectomy, chondroplasty ;  Surgeon: Dereck Leep, MD;  Location: [facility] ORS;  Service: Orthopedics;  Laterality: Right;  . Left total hip arthroplasty    . PAROTIDECTOMY    . PITUITARY SURGERY    . THYROID SURGERY     bx  . THYROIDECTOMY      SOCIAL HISTORY:   Social History   Tobacco Use  . Smoking status: Never Smoker  . Smokeless tobacco: Never Used  Substance Use Topics  . Alcohol use: No     FAMILY HISTORY:   Family History  Problem Relation Age of Onset  . Heart disease Father   . Heart attack Father   . Diabetes Paternal Uncle   . Cancer Neg Hx   . Breast cancer Neg Hx     DRUG ALLERGIES:   Allergies  Allergen Reactions  . Acetaminophen-Codeine Other (See Comments)  . Aleve [Naproxen] Hives    Causes dizziness  . Penicillins Hives    Has patient had a PCN reaction causing immediate rash, facial/tongue/throat swelling, SOB or lightheadedness with hypotension: hives Has patient had a PCN reaction causing severe rash involving mucus membranes or skin necrosis: no Has patient had a PCN reaction that required hospitalization no Has patient had a PCN reaction occurring within the last 10 years: no If all of the above answers are "NO", then may proceed with Cephalosporin use.  . Sulfa Antibiotics Hives  . Codeine Hives, Itching and Other (See Comments)    dizziness    REVIEW OF SYSTEMS:   Review of Systems  Constitutional: Negative for fever and weight loss.  HENT: Negative for congestion, nosebleeds and tinnitus.   Eyes: Negative for blurred vision, double vision and redness.  Respiratory: Negative for cough, hemoptysis and shortness of breath.   Cardiovascular: Negative for chest pain, orthopnea, leg swelling and PND.       Syncope  Gastrointestinal: Negative for abdominal pain, diarrhea, melena, nausea and vomiting.  Genitourinary: Negative for dysuria, hematuria and urgency.  Musculoskeletal: Negative for falls and joint pain.  Neurological: Negative for dizziness, tingling, sensory change, focal weakness, seizures, weakness and headaches.  Endo/Heme/Allergies: Negative for polydipsia. Does not bruise/bleed easily.  Psychiatric/Behavioral: Negative for depression and memory loss. The patient is not nervous/anxious.     MEDICATIONS AT HOME:   Prior to Admission medications   Medication Sig Start Date End Date Taking? Authorizing Provider    diphenhydrAMINE (BENADRYL) 25 MG tablet Take 25 mg by mouth every 6 (six) hours as needed.    [provider]  docusate sodium (COLACE) 100 MG capsule Take 100-200 mg by mouth 2 (two) times daily.     [provider]  DULoxetine (CYMBALTA) 60 MG capsule Take 60 mg by mouth daily.  02/02/16   [provider]  ferrous sulfate 325 (65 FE) MG tablet Take 325 mg by mouth daily with breakfast.     [provider]  Fluticasone-Salmeterol (ADVAIR DISKUS) 250-50 MCG/DOSE AEPB Inhale 1 puff into the lungs 2 (two) times daily.    [provider]  furosemide (LASIX) 20 MG tablet TAKE 1 TABLET BY MOUTH TWICE DAILY. 11/03/15   [provider]  gabapentin (NEURONTIN)  400 MG capsule Take 800 mg by mouth 2 (two) times daily.  02/09/16   [provider]  levothyroxine (SYNTHROID, LEVOTHROID) 88 MCG tablet  10/04/17   [provider]  MAGNESIUM PO Take 250 mg by mouth daily.     [provider]  Multiple Vitamins-Minerals (PRESERVISION AREDS 2) CAPS Take 1 capsule by mouth 2 (two) times daily.    [provider]  omeprazole (PRILOSEC) 40 MG capsule Take 40 mg by mouth daily.  02/16/16   [provider]  potassium chloride (K-DUR,KLOR-CON) 10 MEQ tablet Take 10 mEq by mouth daily.     [provider]  traMADol (ULTRAM) 50 MG tablet Take 50 mg by mouth every 6 (six) hours as needed.    [provider]      VITAL SIGNS:  Blood pressure (!) 170/84, pulse 64, resp. rate (!) 9, height 5' (1.524 m), weight 53.1 kg, SpO2 98 %.  PHYSICAL EXAMINATION:  Physical Exam  GENERAL:  85 y.o.-year-old patient lying in the bed with no acute distress.  EYES: Pupils equal, round, reactive to light and accommodation. No scleral icterus. Extraocular muscles intact.  HEENT: Head atraumatic, normocephalic. Oropharynx and nasopharynx clear. No oropharyngeal erythema, moist oral mucosa  NECK:  Supple, no jugular venous  distention. No thyroid enlargement, no tenderness.  LUNGS: Normal breath sounds bilaterally, no wheezing, rales, rhonchi. No use of accessory muscles of respiration.  CARDIOVASCULAR: S1, S2 RRR. No murmurs, rubs, gallops, clicks.  ABDOMEN: Soft, nontender, nondistended. Bowel sounds present. No organomegaly or mass.  EXTREMITIES: No pedal edema, cyanosis, or clubbing. + 2 pedal & radial pulses b/l.   NEUROLOGIC: Cranial nerves II through XII are intact. No focal Motor or sensory deficits appreciated b/l PSYCHIATRIC: The patient is alert and oriented x 3.   SKIN: No obvious rash, lesion, or ulcer.   LABORATORY PANEL:   CBC Recent Labs  Lab 11/18/18 1845  WBC 8.1  HGB 11.7*  HCT 37.0  PLT 227   ------------------------------------------------------------------------------------------------------------------  Chemistries  Recent Labs  Lab 11/18/18 1744  NA 134*  K 4.8  CL 104  CO2 23  GLUCOSE 106*  BUN 17  CREATININE 0.85  CALCIUM 7.8*  MG 2.5*   ------------------------------------------------------------------------------------------------------------------  Cardiac Enzymes Recent Labs  Lab 11/18/18 1744  TROPONINI 3.36*   ------------------------------------------------------------------------------------------------------------------  RADIOLOGY:  No results found.   IMPRESSION AND PLAN:   Laura Walters  is a 85 y.o. female with a known history of hypertension, hypothyroidism, GERD, history of thyroid cancer who was recently in the hospital secondary to syncope thought to be secondary to prolonged QT from patient being on ciprofloxacin, who returns to the hospital after a recurrent syncopal episode and noted to have an elevated troponin of 3.3.  1.  Non-ST elevation MI- patient rules in by her elevated troponin at 3.3.  She has no acute chest pain, her EKG shows no acute ST or T wave changes. - Unclear if her troponin is more demand ischemia versus non-escalation  MI. - Placed on a heparin drip, continue aspirin, add high-dose intensity statin. - We will get a cardiology consult, will also check echocardiogram.  2.  Syncope-etiology unclear.  Patient has had a Holter monitor as an outpatient which was negative. - Patient likely would benefit from a event/Loop monitor.  For now observe on telemetry watch for arrhythmias, cycle cardiac markers.  Repeat echocardiogram, will also get a cardiology consult as mentioned.  3.  Hypothyroidism-continue Synthroid.    4.  GERD-continue  Protonix.  5.  Neuropathy-continue gabapentin.  6.  Depression-continue Cymbalta.   All the records are reviewed and case discussed with ED provider. Management plans discussed with the patient, family and they are in agreement.  CODE STATUS: Full code  TOTAL TIME TAKING CARE OF THIS PATIENT: 45 minutes.    Henreitta Leber M.D on 11/18/2018 at 9:31 PM  Between 7am to 6pm - Pager - [phone]  After 6pm go to [email or web address] - password [email or web address]  Office  [phone]  CC: Primary care physician; Idelle Crouch, MD

## 2018-11-18 NOTE — ED Notes (Signed)
Lorriane Shire, RN in to verify heparin bolus and infusion.

## 2018-11-18 NOTE — ED Triage Notes (Signed)
Pt to ED via [facility] from home for syncopal episode. Per EMS pt has had 4 syncopal episodes in the last 4, pt seeing her PCP and cardiology to determine why. Pt states that she was sitting on the couch today and next thing she knew she woke up on the floor. Pt states that she was in the floor for approximately 1 hour before being able to call for help. Pt denies pain currently. Pt was given Zofran 4 mg IV by EMS in route and 500 bolus if NS was started.

## 2018-11-18 NOTE — ED Provider Notes (Signed)
[facility] Emergency Department Provider Note  ____________________________________________  Time seen: Approximately 7:06 PM  I have reviewed the triage vital signs and the nursing notes.   HISTORY  Chief Complaint Loss of Consciousness   HPI Laura Walters is a 85 y.o. female with a history of thyroid cancer in 2016, idiopathic cardiomyopathy, hypertension, asthma who presents for evaluation of syncope.  Patient recently admitted to the hospital 2 weeks ago overnight for syncope.  Patient has had a total of 4 syncopes with one today which prompted her visit back to the emergency room.  She reports that she was in the kitchen baking a cake when she started feeling dizzy.  She try to get to the couch but lost consciousness and fell on the ground.  She did not sustain any injuries.  She is not on blood thinners.  During admission patient syncopal events were thought to be due to prolonged QTC caused by Cipro which patient was taking for a UTI.  Cipro was discontinued during that admission.  She denies any signs or symptoms of urinary tract infection.  She denies chest pain, headache, shortness of breath preceding or after the syncopal event.  She endorses normal oral intake.  Patient reports that she wore a Holter monitor for 72 hours and sent that and for evaluation last week.  She denies any episodes of syncope or near syncope while wearing the monitor.  Past Medical History:  Diagnosis Date  . Arthritis   . Asthma   . Back pain   . Cancer of thyroid ([place]) 03/30/2015  . Cardiomyopathy ([place])    idiopathic  . Chest pain    non cardiac  . Chokes    easily   swallow test negative  . Constipation   . DDD (degenerative disc disease), lumbar   . Disc disorder   . GERD (gastroesophageal reflux disease)   . H/O wheezing    advair as needed  . Hypertension   . Hypothyroidism   . IBS (irritable bowel syndrome)   . Neuropathy   . Nocturia   . RAD (reactive airway  disease)   . Rectocele   . Seasonal allergies   . Spinal stenosis   . Urge incontinence   . Vaginal atrophy   . Vaginal polyp     Patient Active Problem List   Diagnosis Date Noted  . Syncope 11/03/2018  . Vaginal bleeding 08/02/2018  . Abrasion of vagina 08/02/2018  . Goals of care, counseling/discussion 01/05/2018  . Uterine prolapse 02/03/2017  . Pulmonary metastases ([place]) 04/09/2016  . RAD (reactive airway disease) 02/27/2016  . Recurrent sinus infections 01/31/2016  . H/O total knee replacement 01/20/2016  . S/P total knee arthroplasty 01/05/2016  . 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 Arthropathy, traumatic, knee 12/16/2015  . Cystocele, midline 12/02/2015  . Rectocele 12/02/2015  . Frequent UTI 11/26/2015  . Acquired hypothyroidism 10/13/2015  . Pituitary tumor 10/13/2015  . Degeneration of intervertebral disc of lumbar region 08/01/2015  . Lumbar canal stenosis 08/01/2015  . Neuritis or radiculitis due to rupture of lumbar intervertebral disc 08/01/2015  . LBP (low back pain) 03/30/2015  . Neuropathy ([place]) 03/30/2015  . Chest pain, non-cardiac 03/30/2015  . Neoplasm of pituitary gland 03/30/2015  . Cancer of thyroid ([place]) 03/30/2015  . Combined fat and carbohydrate induced hyperlipemia 10/17/2014  . Primary cardiomyopathy ([place]) 10/14/2014    Past Surgical History:  Procedure Laterality Date  . BREAST BIOPSY Right 88 and  89   2 bx-neg  . CATARACT EXTRACTION W/PHACO Right 05/19/2016   Procedure: CATARACT EXTRACTION PHACO AND INTRAOCULAR LENS PLACEMENT (IOC);  Surgeon: Estill Cotta, MD;  Location: [facility] ORS;  Service: Ophthalmology;  Laterality: Right;  Lot# [id number] [place]:   1:25.3 AP%:  24.9% CDE:  40.11  . CATARACT EXTRACTION W/PHACO Left 08/11/2016   Procedure: CATARACT EXTRACTION PHACO AND INTRAOCULAR LENS PLACEMENT (IOC);  Surgeon: Estill Cotta, MD;  Location: [facility] ORS;  Service: Ophthalmology;  Laterality: Left;  [place] 1.24 AP%  26.1 CDE 37.79 FLUID PACK LOT # [id number]  . DILATION AND CURETTAGE OF UTERUS    . HEMORROIDECTOMY     x 2  . JOINT REPLACEMENT     left hip  . KNEE ARTHROPLASTY Right 01/05/2016   Procedure: COMPUTER ASSISTED TOTAL KNEE ARTHROPLASTY;  Surgeon: Dereck Leep, MD;  Location: [facility] ORS;  Service: Orthopedics;  Laterality: Right;  . KNEE ARTHROSCOPY Right 05/19/2015   Procedure: Right knee arthrosocpy medail and lateral menisectomy, chondroplasty ;  Surgeon: Dereck Leep, MD;  Location: [facility] ORS;  Service: Orthopedics;  Laterality: Right;  . Left total hip arthroplasty    . PAROTIDECTOMY    . PITUITARY SURGERY    . THYROID SURGERY     bx  . THYROIDECTOMY      Prior to Admission medications   Medication Sig Start Date End Date Taking? Authorizing Provider  diphenhydrAMINE (BENADRYL) 25 MG tablet Take 25 mg by mouth every 6 (six) hours as needed.    [provider]  docusate sodium (COLACE) 100 MG capsule Take 100-200 mg by mouth 2 (two) times daily.     [provider]  DULoxetine (CYMBALTA) 60 MG capsule Take 60 mg by mouth daily.  02/02/16   [provider]  ferrous sulfate 325 (65 FE) MG tablet Take 325 mg by mouth daily with breakfast.     [provider]  Fluticasone-Salmeterol (ADVAIR DISKUS) 250-50 MCG/DOSE AEPB Inhale 1 puff into the lungs 2 (two) times daily.    [provider]  furosemide (LASIX) 20 MG tablet TAKE 1 TABLET BY MOUTH TWICE DAILY. 11/03/15   [provider]  gabapentin (NEURONTIN) 400 MG capsule Take 800 mg by mouth 2 (two) times daily.  02/09/16   [provider]  levothyroxine (SYNTHROID, LEVOTHROID) 88 MCG tablet  10/04/17   [provider]  MAGNESIUM PO Take 250 mg by mouth daily.     [provider]  Multiple Vitamins-Minerals (PRESERVISION AREDS 2) CAPS Take 1 capsule by mouth 2 (two) times daily.    [provider]  omeprazole (PRILOSEC) 40 MG capsule Take 40 mg by mouth daily.   02/16/16   [provider]  potassium chloride (K-DUR,KLOR-CON) 10 MEQ tablet Take 10 mEq by mouth daily.     [provider]  traMADol (ULTRAM) 50 MG tablet Take 50 mg by mouth every 6 (six) hours as needed.    [provider]    Allergies Acetaminophen-codeine; Aleve [naproxen]; Penicillins; Sulfa antibiotics; and Codeine  Family History  Problem Relation Age of Onset  . Heart disease Father   . Diabetes Paternal Uncle   . Cancer Neg Hx   . Breast cancer Neg Hx     Social History Social History   Tobacco Use  . Smoking status: Never Smoker  . Smokeless tobacco: Never Used  Substance Use Topics  . Alcohol use: No  . Drug use: No    Review of Systems  Constitutional: Negative for  fever. + syncope Eyes: Negative for visual changes. ENT: Negative for sore throat. Neck: No neck pain  Cardiovascular: Negative for chest pain. Respiratory: Negative for shortness of breath. Gastrointestinal: Negative for abdominal pain, vomiting or diarrhea. Genitourinary: Negative for dysuria. Musculoskeletal: Negative for back pain. Skin: Negative for rash. Neurological: Negative for headaches, weakness or numbness. Psych: No SI or HI  ____________________________________________   PHYSICAL EXAM:  VITAL SIGNS: ED Triage Vitals  Enc Vitals Group     BP 11/18/18 1548 (!) 122/58     Pulse Rate 11/18/18 1548 (!) 42     Resp 11/18/18 1545 16     Temp --      Temp src --      SpO2 11/18/18 1548 100 %     Weight 11/18/18 1549 117 lb (53.1 kg)     Height 11/18/18 1549 5' (1.524 m)     Head Circumference --      Peak Flow --      Pain Score 11/18/18 1549 0     Pain Loc --      Pain Edu? --      Excl. in [place]? --     Constitutional: Alert and oriented. Well appearing and in no apparent distress. HEENT:      Head: Normocephalic and atraumatic.         Eyes: Conjunctivae are normal. Sclera is non-icteric.       Mouth/Throat: Mucous membranes are moist.        Neck: Supple with no signs of meningismus. Cardiovascular: Regular rate and rhythm. No murmurs, gallops, or rubs. 2+ symmetrical distal pulses are present in all extremities. No JVD. Respiratory: Normal respiratory effort. Lungs are clear to auscultation bilaterally. No wheezes, crackles, or rhonchi.  Gastrointestinal: Soft, non tender, and non distended with positive bowel sounds. No rebound or guarding. Musculoskeletal: Nontender with normal range of motion in all extremities. No edema, cyanosis, or erythema of extremities. Neurologic: Normal speech and language. Face is symmetric. Moving all extremities. No gross focal neurologic deficits are appreciated. Skin: Skin is warm, dry and intact. No rash noted. Psychiatric: Mood and affect are normal. Speech and behavior are normal.  ____________________________________________   LABS (all labs ordered are listed, but only abnormal results are displayed)  Labs Reviewed  BASIC METABOLIC PANEL - Abnormal; Notable for the following components:      Result Value   Sodium 134 (*)    Glucose, Bld 106 (*)    Calcium 7.8 (*)    All other components within normal limits  URINALYSIS, COMPLETE (UACMP) WITH MICROSCOPIC - Abnormal; Notable for the following components:   Color, Urine YELLOW (*)    APPearance CLEAR (*)    Leukocytes,Ua TRACE (*)    Bacteria, UA RARE (*)    All other components within normal limits  MAGNESIUM - Abnormal; Notable for the following components:   Magnesium 2.5 (*)    All other components within normal limits  TSH - Abnormal; Notable for the following components:   TSH 0.089 (*)    All other components within normal limits  CBC - Abnormal; Notable for the following components:   Hemoglobin 11.7 (*)    All other components within normal limits  TROPONIN I - Abnormal; Notable for the following components:   Troponin I 3.36 (*)    All other components within normal limits  URINE CULTURE  T4, FREE  CBG MONITORING,  ED   ____________________________________________  EKG  ED ECG REPORT I, Rudene Re,  the attending physician, personally viewed and interpreted this ECG.  Sinus bradycardia, rate of 43, prolonged QTC of 497, normal axis, no ST elevations or depressions.  Unchanged from prior. ____________________________________________  RADIOLOGY  none  ____________________________________________   PROCEDURES  Procedure(s) performed: None Procedures Critical Care performed:  Yes  CRITICAL CARE Performed by: Rudene Re  ?  Total critical care time: 35 min  Critical care time was exclusive of separately billable procedures and treating other patients.  Critical care was necessary to treat or prevent imminent or life-threatening deterioration.  Critical care was time spent personally by me on the following activities: development of treatment plan with patient and/or surrogate as well as nursing, discussions with consultants, evaluation of patient's response to treatment, examination of patient, obtaining history from patient or surrogate, ordering and performing treatments and interventions, ordering and review of laboratory studies, ordering and review of radiographic studies, pulse oximetry and re-evaluation of patient's condition.  ____________________________________________   INITIAL IMPRESSION / ASSESSMENT AND PLAN / ED COURSE   85 y.o. female with a history of thyroid cancer in 2016, idiopathic cardiomyopathy, hypertension, asthma who presents for evaluation of syncope.  This is patient's fourth episode over the last 2 weeks.  No prior history of syncope.  Patient lives alone. Initially thought to be due to prolonged QTC due to Cipro.  Patient has been off of Cipro for 2 weeks.  Her QTC is unchanged, sinus bradycardia also unchanged.  Had another syncopal episode today.  Upon arrival to the emergency room patient is orthostatic and fluids were given. Results of Holter  monitoring not available for evaluation. Patient denies any symptoms of syncope or near syncope while on the monitor.  Labs showing stable mild anemia with hemoglobin of 11.7, stable creatinine, normal potassium magnesium.  Thyroid studies were done due to history of thyroid cancer hypothyroidism however free T4 is normal. Troponin elevated at 3.36 concerning for NSTEMI. Patient now reports that prior to her syncopal event today she had an aching pain on her left arm which she attributed to muscular pain. She has been having several episodes of L arm aching pain over the last 2 weeks. I am concerned for NSTEMI. No pain at this time. Will start patient on heparin gtt.      As part of my medical decision making, I reviewed the following data within the [place] notes reviewed and incorporated, Labs reviewed , EKG interpreted , Old EKG reviewed, Old chart reviewed, A consult was requested and obtained from this/these consultant(s) Cardiology , Notes from prior ED visits and [place] Controlled Substance Database    Pertinent labs & imaging results that were available during my care of the patient were reviewed by me and considered in my medical decision making (see chart for details).    ____________________________________________   FINAL CLINICAL IMPRESSION(S) / ED DIAGNOSES  Final diagnoses:  Syncope, unspecified syncope type  NSTEMI (non-ST elevated myocardial infarction) ([place])      NEW MEDICATIONS STARTED DURING THIS VISIT:  ED Discharge Orders    None       Note:  This document was prepared using Dragon voice recognition software and may include unintentional dictation errors.    Rudene Re, MD 11/18/18 2045

## 2018-11-18 NOTE — Progress Notes (Signed)
ANTICOAGULATION CONSULT NOTE - Initial Consult  Pharmacy Consult for Heparin  Indication: chest pain/ACS  Allergies  Allergen Reactions  . Acetaminophen-Codeine Other (See Comments)  . Aleve [Naproxen] Hives    Causes dizziness  . Penicillins Hives    Has patient had a PCN reaction causing immediate rash, facial/tongue/throat swelling, SOB or lightheadedness with hypotension: hives Has patient had a PCN reaction causing severe rash involving mucus membranes or skin necrosis: no Has patient had a PCN reaction that required hospitalization no Has patient had a PCN reaction occurring within the last 10 years: no If all of the above answers are "NO", then may proceed with Cephalosporin use.  . Sulfa Antibiotics Hives  . Codeine Hives, Itching and Other (See Comments)    dizziness    Patient Measurements: Height: 5' (152.4 cm) Weight: 117 lb (53.1 kg) IBW/kg (Calculated) : 45.5 Heparin Dosing Weight:  53.1 kg   Vital Signs: BP: 169/87 (02/22 2100) Pulse Rate: 63 (02/22 2100)  Labs: Recent Labs    11/18/18 1744 11/18/18 1845  HGB  --  11.7*  HCT  --  37.0  PLT  --  227  CREATININE 0.85  --   TROPONINI 3.36*  --     Estimated Creatinine Clearance: 36 mL/min (by C-G formula based on SCr of 0.85 mg/dL).   Medical History: Past Medical History:  Diagnosis Date  . Arthritis   . Asthma   . Back pain   . Cancer of thyroid ([place]) 03/30/2015  . Cardiomyopathy ([place])    idiopathic  . Chest pain    non cardiac  . Chokes    easily   swallow test negative  . Constipation   . DDD (degenerative disc disease), lumbar   . Disc disorder   . GERD (gastroesophageal reflux disease)   . H/O wheezing    advair as needed  . Hypertension   . Hypothyroidism   . IBS (irritable bowel syndrome)   . Neuropathy   . Nocturia   . RAD (reactive airway disease)   . Rectocele   . Seasonal allergies   . Spinal stenosis   . Urge incontinence   . Vaginal atrophy   . Vaginal polyp      Medications:  (Not in a hospital admission)   Assessment: Pharmacy consulted to dose heparin in this 85 year old female with ACS/NSTEMI.  CrCl= 36 ml/min No prior anticoag noted.   Goal of Therapy:  Heparin level 0.3-0.7 units/ml Monitor platelets by anticoagulation protocol: Yes   Plan:  Give 3200  units bolus x 1 Start heparin infusion at 600 units/hr Check anti-Xa level in 8 hours and daily while on heparin Continue to monitor H&H and platelets  Fronie Holstein D 11/18/2018,9:51 PM

## 2018-11-18 NOTE — ED Notes (Signed)
ED TO INPATIENT HANDOFF REPORT  ED Nurse Name and Phone #:  Marye Round, Rn [phone]  S Name/Age/Gender Laura Walters 85 y.o. female Room/Bed: ED18A/ED18A  Code Status   Code Status: Prior  Home/SNF/Other Home Patient oriented to: x4 Is this baseline? Yes   Triage Complete: Triage complete  Chief Complaint Weakness  Triage Note Pt to ED via [facility] from home for syncopal episode. Per EMS pt has had 4 syncopal episodes in the last 4, pt seeing her PCP and cardiology to determine why. Pt states that she was sitting on the couch today and next thing she knew she woke up on the floor. Pt states that she was in the floor for approximately 1 hour before being able to call for help. Pt denies pain currently. Pt was given Zofran 4 mg IV by EMS in route and 500 bolus if NS was started.    Allergies Allergies  Allergen Reactions  . Acetaminophen-Codeine Other (See Comments)  . Aleve [Naproxen] Hives    Causes dizziness  . Penicillins Hives    Has patient had a PCN reaction causing immediate rash, facial/tongue/throat swelling, SOB or lightheadedness with hypotension: hives Has patient had a PCN reaction causing severe rash involving mucus membranes or skin necrosis: no Has patient had a PCN reaction that required hospitalization no Has patient had a PCN reaction occurring within the last 10 years: no If all of the above answers are "NO", then may proceed with Cephalosporin use.  . Sulfa Antibiotics Hives  . Codeine Hives, Itching and Other (See Comments)    dizziness    Level of Care/Admitting Diagnosis ED Disposition    ED Disposition Condition Comment   Admit  Hospital Area: [place] [[id number]]  Level of Care: Telemetry [5]  Diagnosis: NSTEMI (non-ST elevated myocardial infarction) [facility]) [[id number]]  Admitting Physician: Henreitta Leber [[id number]]  Attending Physician: Henreitta Leber [[id number]]  Estimated length of stay: past midnight tomorrow  Certification:: I  certify this patient will need inpatient services for at least 2 midnights  Bed request comments: 2a  PT Class (Do Not Modify): Inpatient [101]  PT Acc Code (Do Not Modify): Private [1]       B Medical/Surgery History Past Medical History:  Diagnosis Date  . Arthritis   . Asthma   . Back pain   . Cancer of thyroid ([place]) 03/30/2015  . Cardiomyopathy ([place])    idiopathic  . Chest pain    non cardiac  . Chokes    easily   swallow test negative  . Constipation   . DDD (degenerative disc disease), lumbar   . Disc disorder   . GERD (gastroesophageal reflux disease)   . H/O wheezing    advair as needed  . Hypertension   . Hypothyroidism   . IBS (irritable bowel syndrome)   . Neuropathy   . Nocturia   . RAD (reactive airway disease)   . Rectocele   . Seasonal allergies   . Spinal stenosis   . Urge incontinence   . Vaginal atrophy   . Vaginal polyp    Past Surgical History:  Procedure Laterality Date  . BREAST BIOPSY Right 86 and 92   2 bx-neg  . CATARACT EXTRACTION W/PHACO Right 05/19/2016   Procedure: CATARACT EXTRACTION PHACO AND INTRAOCULAR LENS PLACEMENT (IOC);  Surgeon: Estill Cotta, MD;  Location: [facility] ORS;  Service: Ophthalmology;  Laterality: Right;  Lot# [id number] [place]:   1:25.3 AP%:  24.9% CDE:  40.11  . CATARACT  EXTRACTION W/PHACO Left 08/11/2016   Procedure: CATARACT EXTRACTION PHACO AND INTRAOCULAR LENS PLACEMENT (IOC);  Surgeon: Estill Cotta, MD;  Location: [facility] ORS;  Service: Ophthalmology;  Laterality: Left;  [place] 1.24 AP% 26.1 CDE 37.79 FLUID PACK LOT # [id number]  . DILATION AND CURETTAGE OF UTERUS    . HEMORROIDECTOMY     x 2  . JOINT REPLACEMENT     left hip  . KNEE ARTHROPLASTY Right 01/05/2016   Procedure: COMPUTER ASSISTED TOTAL KNEE ARTHROPLASTY;  Surgeon: Dereck Leep, MD;  Location: [facility] ORS;  Service: Orthopedics;  Laterality: Right;  . KNEE ARTHROSCOPY Right 05/19/2015   Procedure: Right knee arthrosocpy medail and lateral menisectomy,  chondroplasty ;  Surgeon: Dereck Leep, MD;  Location: [facility] ORS;  Service: Orthopedics;  Laterality: Right;  . Left total hip arthroplasty    . PAROTIDECTOMY    . PITUITARY SURGERY    . THYROID SURGERY     bx  . THYROIDECTOMY       A IV Location/Drains/Wounds Patient Lines/Drains/Airways Status   Active Line/Drains/Airways    Name:   Placement date:   Placement time:   Site:   Days:   Peripheral IV 11/18/18 Left Forearm   11/18/18    1546    Forearm   less than 1   Peripheral IV 11/18/18 Right Forearm   11/18/18    2056    Forearm   less than 1          Intake/Output Last 24 hours No intake or output data in the 24 hours ending 11/18/18 2353  Labs/Imaging Results for orders placed or performed during the hospital encounter of 11/18/18 (from the past 48 hour(s))  Basic metabolic panel     Status: Abnormal   Collection Time: 11/18/18  5:44 PM  Result Value Ref Range   Sodium 134 (L) 135 - 145 mmol/L   Potassium 4.8 3.5 - 5.1 mmol/L    Comment: HEMOLYSIS AT THIS LEVEL MAY AFFECT RESULT   Chloride 104 98 - 111 mmol/L   CO2 23 22 - 32 mmol/L   Glucose, Bld 106 (H) 70 - 99 mg/dL   BUN 17 8 - 23 mg/dL   Creatinine, Ser 0.85 0.44 - 1.00 mg/dL   Calcium 7.8 (L) 8.9 - 10.3 mg/dL   GFR calc non Af Amer >60 >60 mL/min   GFR calc Af Amer >60 >60 mL/min   Anion gap 7 5 - 15    Comment: Performed at [facility], [address] [place], [place] [address]  Urinalysis, Complete w Microscopic     Status: Abnormal   Collection Time: 11/18/18  5:44 PM  Result Value Ref Range   Color, Urine YELLOW (A) YELLOW   APPearance CLEAR (A) CLEAR   Specific Gravity, Urine 1.011 1.005 - 1.030   pH 7.0 5.0 - 8.0   Glucose, UA NEGATIVE NEGATIVE mg/dL   Hgb urine dipstick NEGATIVE NEGATIVE   Bilirubin Urine NEGATIVE NEGATIVE   Ketones, ur NEGATIVE NEGATIVE mg/dL   Protein, ur NEGATIVE NEGATIVE mg/dL   Nitrite NEGATIVE NEGATIVE   Leukocytes,Ua TRACE (A) NEGATIVE   RBC / HPF 0-5 0 - 5  RBC/hpf   WBC, UA 6-10 0 - 5 WBC/hpf   Bacteria, UA RARE (A) NONE SEEN   Squamous Epithelial / LPF 6-10 0 - 5   Mucus PRESENT     Comment: Performed at [facility], [address] [place], [place] [address]  Magnesium     Status:  Abnormal   Collection Time: 11/18/18  5:44 PM  Result Value Ref Range   Magnesium 2.5 (H) 1.7 - 2.4 mg/dL    Comment: Performed at [facility], [address] [place], [place] [address]  T4, free     Status: None   Collection Time: 11/18/18  5:44 PM  Result Value Ref Range   Free T4 1.34 0.82 - 1.77 ng/dL    Comment: (NOTE) Biotin ingestion may interfere with free T4 tests. If the results are inconsistent with the TSH level, previous test results, or the clinical presentation, then consider biotin interference. If needed, order repeat testing after stopping biotin. Performed at [facility], [address] [place], [place] [address]   TSH     Status: Abnormal   Collection Time: 11/18/18  5:44 PM  Result Value Ref Range   TSH 0.089 (L) 0.350 - 4.500 uIU/mL    Comment: Performed by a 3rd Generation assay with a functional sensitivity of <=0.01 uIU/mL. Performed at [facility], [address] [place], [place] [address]   Troponin I - Add-On to previous collection     Status: Abnormal   Collection Time: 11/18/18  5:44 PM  Result Value Ref Range   Troponin I 3.36 (HH) <0.03 ng/mL    Comment: CRITICAL RESULT CALLED TO, READ BACK BY AND VERIFIED WITH BRITTNEY SIMPSON @2030  11/18/2018 TTG Performed at [facility], [address] [place], [place] [address]   CBC     Status: Abnormal   Collection Time: 11/18/18  6:45 PM  Result Value Ref Range   WBC 8.1 4.0 - 10.5 K/uL   RBC 3.95 3.87 - 5.11 MIL/uL   Hemoglobin 11.7 (L) 12.0 - 15.0 g/dL   HCT 37.0 36.0 - 46.0 %   MCV 93.7 80.0 - 100.0 fL   MCH 29.6 26.0 - 34.0 pg   MCHC 31.6 30.0 - 36.0 g/dL   RDW 13.3 11.5 - 15.5 %   Platelets 227 150 - 400  K/uL   nRBC 0.0 0.0 - 0.2 %    Comment: Performed at [facility], [address] [place], [place] [address]  APTT     Status: None   Collection Time: 11/18/18  9:47 PM  Result Value Ref Range   aPTT 32 24 - 36 seconds    Comment: Performed at [facility], [address], [place],  [address]  Protime-INR     Status: None   Collection Time: 11/18/18  9:47 PM  Result Value Ref Range   Prothrombin Time 12.8 11.4 - 15.2 seconds   INR 0.97     Comment: Performed at [facility], [address], [place],  [address]   No results found.  Pending Labs Unresulted Labs (From admission, onward)    Start     Ordered   11/19/18 0600  Heparin level (unfractionated)  Once-Timed,   STAT     11/18/18 2222   11/18/18 2349  Troponin I - ONCE - STAT  ONCE - STAT,   STAT     11/18/18 2348   11/18/18 1905  Urine Culture  Add-on,   AD     11/18/18 1904          Vitals/Pain Today's Vitals   11/18/18 1900 11/18/18 2030 11/18/18 2100 11/18/18 2230  BP: (!) 150/72 (!) 170/84 (!) 169/87 (!) 165/92  Pulse: (!) 53 64 63 63  Resp: 10 (!) 9 (!) 9 (!) 8  SpO2: 95%  98% 97% 100%  Weight:      Height:      PainSc:        Isolation Precautions No active isolations  Medications Medications  heparin ADULT infusion 100 units/mL (25000 units/250mL sodium chloride 0.45%) (600 Units/hr Intravenous New Bag/Given 11/18/18 2221)  loratadine (CLARITIN) tablet 10 mg (has no administration in time range)  sodium chloride flush (NS) 0.9 % injection 3 mL (3 mLs Intravenous Given 11/18/18 1820)  heparin bolus via infusion 3,200 Units (3,200 Units Intravenous Bolus from Bag 11/18/18 2220)    Mobility walks Moderate fall risk   Focused Assessments Cardiac Assessment Handoff:    Lab Results  Component Value Date   TROPONINI 3.36 ([place]) 11/18/2018   No results found for: DDIMER Does the Patient currently have chest pain? No     R Recommendations: See Admitting  Provider Note  Report given to:   Additional Notes:

## 2018-11-19 ENCOUNTER — Inpatient Hospital Stay
Admit: 2018-11-19 | Discharge: 2018-11-19 | Disposition: A | Discharge status: Home / Self Care | Payer: Medicare Other | Attending: Specialist | Admitting: Specialist

## 2018-11-19 LAB — TROPONIN I
Troponin I: <0.03 ng/mL (ref <0.03)
Troponin I: <0.03 ng/mL (ref <0.03)

## 2018-11-19 LAB — ECHOCARDIOGRAM COMPLETE
Height: 60 in
Weight: 1867.2 oz

## 2018-11-19 LAB — HEPARIN LEVEL (UNFRACTIONATED): Heparin Unfractionated: 0.68 IU/mL (ref 0.30–0.70)

## 2018-11-19 MED ORDER — GABAPENTIN 400 MG PO CAPS
800.0000 mg | ORAL_CAPSULE | Freq: Two times a day (BID) | ORAL | Status: DC
  Administered 2018-11-19 (×2): 800 mg via ORAL
  Filled 2018-11-19 (×2): qty 2

## 2018-11-19 MED ORDER — ATORVASTATIN CALCIUM 20 MG PO TABS: 40.0000 mg | ORAL_TABLET | Freq: Every day | ORAL | Status: DC

## 2018-11-19 MED ORDER — ASPIRIN EC 81 MG PO TBEC
81.0000 mg | DELAYED_RELEASE_TABLET | Freq: Every day | ORAL | Status: DC
  Filled 2018-11-19: qty 1

## 2018-11-19 MED ORDER — FUROSEMIDE 20 MG PO TABS
20.0000 mg | ORAL_TABLET | Freq: Two times a day (BID) | ORAL | Status: DC
  Filled 2018-11-19: qty 1

## 2018-11-19 MED ORDER — TRAMADOL HCL 50 MG PO TABS: 50.0000 mg | ORAL_TABLET | Freq: Four times a day (QID) | ORAL | Status: DC | PRN

## 2018-11-19 MED ORDER — LEVOTHYROXINE SODIUM 88 MCG PO TABS
88.0000 ug | ORAL_TABLET | Freq: Every day | ORAL | Status: DC
  Administered 2018-11-19: 88 ug via ORAL
  Filled 2018-11-19: qty 1

## 2018-11-19 MED ORDER — MOMETASONE FURO-FORMOTEROL FUM 200-5 MCG/ACT IN AERO
2.0000 | INHALATION_SPRAY | Freq: Two times a day (BID) | RESPIRATORY_TRACT | Status: DC
  Filled 2018-11-19: qty 8.8

## 2018-11-19 MED ORDER — PANTOPRAZOLE SODIUM 40 MG PO TBEC
40.0000 mg | DELAYED_RELEASE_TABLET | Freq: Every day | ORAL | Status: DC
  Administered 2018-11-19: 40 mg via ORAL
  Filled 2018-11-19: qty 1

## 2018-11-19 MED ORDER — POTASSIUM CHLORIDE CRYS ER 10 MEQ PO TBCR
10.0000 meq | EXTENDED_RELEASE_TABLET | Freq: Every day | ORAL | Status: DC
  Administered 2018-11-19: 10 meq via ORAL
  Filled 2018-11-19: qty 1

## 2018-11-19 MED ORDER — SODIUM CHLORIDE 0.9% FLUSH
3.0000 mL | Freq: Two times a day (BID) | INTRAVENOUS | Status: DC
  Administered 2018-11-19 (×2): 3 mL via INTRAVENOUS

## 2018-11-19 MED ORDER — FERROUS SULFATE 325 (65 FE) MG PO TABS
325.0000 mg | ORAL_TABLET | Freq: Every day | ORAL | Status: DC
  Filled 2018-11-19 (×2): qty 1

## 2018-11-19 MED ORDER — DULOXETINE HCL 30 MG PO CPEP
60.0000 mg | ORAL_CAPSULE | Freq: Every day | ORAL | Status: DC
  Administered 2018-11-19: 60 mg via ORAL
  Filled 2018-11-19: qty 2

## 2018-11-19 NOTE — Progress Notes (Signed)
ANTICOAGULATION CONSULT NOTE - Initial Consult  Pharmacy Consult for Heparin  Indication: chest pain/ACS  Allergies  Allergen Reactions  . Acetaminophen-Codeine Other (See Comments)  . Aleve [Naproxen] Hives    Causes dizziness  . Penicillins Hives    Has patient had a PCN reaction causing immediate rash, facial/tongue/throat swelling, SOB or lightheadedness with hypotension: hives Has patient had a PCN reaction causing severe rash involving mucus membranes or skin necrosis: no Has patient had a PCN reaction that required hospitalization no Has patient had a PCN reaction occurring within the last 10 years: no If all of the above answers are "NO", then may proceed with Cephalosporin use.  . Sulfa Antibiotics Hives  . Codeine Hives, Itching and Other (See Comments)    dizziness    Patient Measurements: Height: 5' (152.4 cm) Weight: 116 lb 11.2 oz (52.9 kg) IBW/kg (Calculated) : 45.5 Heparin Dosing Weight:  53.1 kg   Vital Signs: Temp: 98.6 F (37 C) (02/23 0530) Temp Source: Oral (02/23 0530) BP: 145/74 (02/23 0530) Pulse Rate: 69 (02/23 0530)  Labs: Recent Labs    11/18/18 1744 11/18/18 1845 11/18/18 2147 11/18/18 2323 11/19/18 0605  HGB  --  11.7*  --   --   --   HCT  --  37.0  --   --   --   PLT  --  227  --   --   --   APTT  --   --  32  --   --   LABPROT  --   --  12.8  --   --   INR  --   --  0.97  --   --   HEPARINUNFRC  --   --   --   --  0.68  CREATININE 0.85  --   --   --   --   TROPONINI 3.36*  --   --  <0.03  --     Estimated Creatinine Clearance: 36 mL/min (by C-G formula based on SCr of 0.85 mg/dL).   Medical History: Past Medical History:  Diagnosis Date  . Arthritis   . Asthma   . Back pain   . Cancer of thyroid ([place]) 03/30/2015  . Cardiomyopathy ([place])    idiopathic  . Chest pain    non cardiac  . Chokes    easily   swallow test negative  . Constipation   . DDD (degenerative disc disease), lumbar   . Disc disorder   . GERD  (gastroesophageal reflux disease)   . H/O wheezing    advair as needed  . Hypertension   . Hypothyroidism   . IBS (irritable bowel syndrome)   . Neuropathy   . Nocturia   . RAD (reactive airway disease)   . Rectocele   . Seasonal allergies   . Spinal stenosis   . Urge incontinence   . Vaginal atrophy   . Vaginal polyp     Medications:  Medications Prior to Admission  Medication Sig Dispense Refill Last Dose  . acetaminophen (TYLENOL) 325 MG tablet Take 650 mg by mouth daily.   11/18/2018 at 1030  . diphenhydrAMINE (BENADRYL) 25 MG tablet Take 25 mg by mouth every 6 (six) hours as needed.   11/17/2018 at 2000  . docusate sodium (COLACE) 100 MG capsule Take 100-200 mg by mouth 2 (two) times daily.    11/17/2018 at 2000  . DULoxetine (CYMBALTA) 60 MG capsule Take 60 mg by mouth daily.    11/18/2018 at  1030  . ferrous sulfate 325 (65 FE) MG tablet Take 325 mg by mouth daily with breakfast.    11/17/2018 at 2000  . Fluticasone-Salmeterol (ADVAIR DISKUS) 250-50 MCG/DOSE AEPB Inhale 1 puff into the lungs 2 (two) times daily.   prn at prn  . gabapentin (NEURONTIN) 400 MG capsule Take 800 mg by mouth 2 (two) times daily.    11/18/2018 at 1030   . levothyroxine (SYNTHROID, LEVOTHROID) 100 MCG tablet Take 100 mcg by mouth. Take 100 mcg every morning for 6 days and 50 mcg in the morning on day 7.   11/18/2018 at 0900  . levothyroxine (SYNTHROID, LEVOTHROID) 50 MCG tablet Take 50 mcg by mouth once a week. Take in the morning of (Wednesday) day 7.   11/15/2018 at 0900  . MAGNESIUM PO Take 250 mg by mouth daily.    11/18/2018 at 1030  . omeprazole (PRILOSEC) 40 MG capsule Take 40 mg by mouth daily.    11/18/2018 at 0830  . potassium chloride (K-DUR,KLOR-CON) 10 MEQ tablet Take 10 mEq by mouth daily.    11/18/2018 at 1030  . Multiple Vitamins-Minerals (PRESERVISION AREDS 2) CAPS Take 1 capsule by mouth 2 (two) times daily.   Not Taking at Unknown time  . traMADol (ULTRAM) 50 MG tablet Take 50 mg by mouth every  6 (six) hours as needed.   prn at prn    Assessment: Pharmacy consulted to dose heparin in this 85 year old female with ACS/NSTEMI.  CrCl= 36 ml/min No prior anticoag noted.   Goal of Therapy:  Heparin level 0.3-0.7 units/ml Monitor platelets by anticoagulation protocol: Yes   Plan:  Give 3200  units bolus x 1 Start heparin infusion at 600 units/hr Check anti-Xa level in 8 hours and daily while on heparin Continue to monitor H&H and platelets  2/23 AM heparin level 0.68. Continue current regimen. Recheck in 8 hours to confirm.  Zain Bingman S 11/19/2018,7:09 AM

## 2018-11-19 NOTE — Progress Notes (Signed)
Pt is being discharged to home with her son. Aox4, VSS, pt does not c/o any pain, dizziness or lightheadedness at this time. AVS/RX was given and explained to the pt and they verbalized understanding of all information as well as the follow up information. Pt was told to Call [facility] clinic on tomorrow to verify scheduled appointment for Tuesday.

## 2018-11-19 NOTE — Progress Notes (Signed)
*  PRELIMINARY RESULTS* Echocardiogram 2D Echocardiogram has been performed.  Lavell Luster Samarah Hogle 11/19/2018, 11:07 AM

## 2018-11-19 NOTE — Consult Note (Signed)
Reason for Consult: Syncope borderline troponins Referring Physician: Dr. Verdell Carmine hospitalist Laura Walters primary Cardiologist Dr. Gaylyn Cheers A Laura Walters is an 85 y.o. female.  HPI: Patient is a 85 year old white female history of recurrent syncopal episodes over the last several weeks patient describes left arm discomfort occasional diaphoresis with syncope and only lasts a few seconds then she is up again her last episode she had prolonged QT after taking Cipro in the study management related to ciprofloxacin.  Patient now complains of bradycardia when she was seen in emergency room in the 40s troponins were slightly elevated so she was admitted for further assessment.  Patient complains of left arm pain during these episodes of syncope patient had vertigo lightheadedness weakness with some diaphoresis and nausea but no vomiting no incontinence no tongue biting no jerking sensation patient scheduled to have a carotid Doppler this Tuesday in the office feels fine now still having unexplained syncopal episodes  Past Medical History:  Diagnosis Date  . Arthritis   . Asthma   . Back pain   . Cancer of thyroid ([place]) 03/30/2015  . Cardiomyopathy ([place])    idiopathic  . Chest pain    non cardiac  . Chokes    easily   swallow test negative  . Constipation   . DDD (degenerative disc disease), lumbar   . Disc disorder   . GERD (gastroesophageal reflux disease)   . H/O wheezing    advair as needed  . Hypertension   . Hypothyroidism   . IBS (irritable bowel syndrome)   . Neuropathy   . Nocturia   . RAD (reactive airway disease)   . Rectocele   . Seasonal allergies   . Spinal stenosis   . Urge incontinence   . Vaginal atrophy   . Vaginal polyp     Past Surgical History:  Procedure Laterality Date  . BREAST BIOPSY Right 86 and 92   2 bx-neg  . CATARACT EXTRACTION W/PHACO Right 05/19/2016   Procedure: CATARACT EXTRACTION PHACO AND INTRAOCULAR LENS PLACEMENT (IOC);  Surgeon: Estill Cotta,  MD;  Location: [facility] ORS;  Service: Ophthalmology;  Laterality: Right;  Lot# [id number] [place]:   1:25.3 AP%:  24.9% CDE:  40.11  . CATARACT EXTRACTION W/PHACO Left 08/11/2016   Procedure: CATARACT EXTRACTION PHACO AND INTRAOCULAR LENS PLACEMENT (IOC);  Surgeon: Estill Cotta, MD;  Location: [facility] ORS;  Service: Ophthalmology;  Laterality: Left;  [place] 1.24 AP% 26.1 CDE 37.79 FLUID PACK LOT # [id number]  . DILATION AND CURETTAGE OF UTERUS    . HEMORROIDECTOMY     x 2  . JOINT REPLACEMENT     left hip  . KNEE ARTHROPLASTY Right 01/05/2016   Procedure: COMPUTER ASSISTED TOTAL KNEE ARTHROPLASTY;  Surgeon: Dereck Leep, MD;  Location: [facility] ORS;  Service: Orthopedics;  Laterality: Right;  . KNEE ARTHROSCOPY Right 05/19/2015   Procedure: Right knee arthrosocpy medail and lateral menisectomy, chondroplasty ;  Surgeon: Dereck Leep, MD;  Location: [facility] ORS;  Service: Orthopedics;  Laterality: Right;  . Left total hip arthroplasty    . PAROTIDECTOMY    . PITUITARY SURGERY    . THYROID SURGERY     bx  . THYROIDECTOMY      Family History  Problem Relation Age of Onset  . Heart disease Father   . Heart attack Father   . Diabetes Paternal Uncle   . Cancer Neg Hx   . Breast cancer Neg Hx     Social History:  reports that she  has never smoked. She has never used smokeless tobacco. She reports that she does not drink alcohol or use drugs.  Allergies:  Allergies  Allergen Reactions  . Acetaminophen-Codeine Other (See Comments)  . Aleve [Naproxen] Hives    Causes dizziness  . Penicillins Hives    Has patient had a PCN reaction causing immediate rash, facial/tongue/throat swelling, SOB or lightheadedness with hypotension: hives Has patient had a PCN reaction causing severe rash involving mucus membranes or skin necrosis: no Has patient had a PCN reaction that required hospitalization no Has patient had a PCN reaction occurring within the last 10 years: no If all of the above answers are "NO",  then may proceed with Cephalosporin use.  . Sulfa Antibiotics Hives  . Codeine Hives, Itching and Other (See Comments)    dizziness    Medications: I have reviewed the patient's current medications.  Results for orders placed or performed during the hospital encounter of 11/18/18 (from the past 48 hour(s))  Basic metabolic panel     Status: Abnormal   Collection Time: 11/18/18  5:44 PM  Result Value Ref Range   Sodium 134 (L) 135 - 145 mmol/L   Potassium 4.8 3.5 - 5.1 mmol/L    Comment: HEMOLYSIS AT THIS LEVEL MAY AFFECT RESULT   Chloride 104 98 - 111 mmol/L   CO2 23 22 - 32 mmol/L   Glucose, Bld 106 (H) 70 - 99 mg/dL   BUN 17 8 - 23 mg/dL   Creatinine, Ser 0.85 0.44 - 1.00 mg/dL   Calcium 7.8 (L) 8.9 - 10.3 mg/dL   GFR calc non Af Amer >60 >60 mL/min   GFR calc Af Amer >60 >60 mL/min   Anion gap 7 5 - 15    Comment: Performed at [facility], [address] [place], [place] [address]  Urinalysis, Complete w Microscopic     Status: Abnormal   Collection Time: 11/18/18  5:44 PM  Result Value Ref Range   Color, Urine YELLOW (A) YELLOW   APPearance CLEAR (A) CLEAR   Specific Gravity, Urine 1.011 1.005 - 1.030   pH 7.0 5.0 - 8.0   Glucose, UA NEGATIVE NEGATIVE mg/dL   Hgb urine dipstick NEGATIVE NEGATIVE   Bilirubin Urine NEGATIVE NEGATIVE   Ketones, ur NEGATIVE NEGATIVE mg/dL   Protein, ur NEGATIVE NEGATIVE mg/dL   Nitrite NEGATIVE NEGATIVE   Leukocytes,Ua TRACE (A) NEGATIVE   RBC / HPF 0-5 0 - 5 RBC/hpf   WBC, UA 6-10 0 - 5 WBC/hpf   Bacteria, UA RARE (A) NONE SEEN   Squamous Epithelial / LPF 6-10 0 - 5   Mucus PRESENT     Comment: Performed at [facility], [address] [place], [place] [address]  Magnesium     Status: Abnormal   Collection Time: 11/18/18  5:44 PM  Result Value Ref Range   Magnesium 2.5 (H) 1.7 - 2.4 mg/dL    Comment: Performed at [facility], [address] [place], [place] [address]  T4, free     Status: None    Collection Time: 11/18/18  5:44 PM  Result Value Ref Range   Free T4 1.34 0.82 - 1.77 ng/dL    Comment: (NOTE) Biotin ingestion may interfere with free T4 tests. If the results are inconsistent with the TSH level, previous test results, or the clinical presentation, then consider biotin interference. If needed, order repeat testing after stopping biotin. Performed at [facility], [place], [name]  [address]   TSH     Status: Abnormal   Collection Time: 11/18/18  5:44 PM  Result Value Ref Range   TSH 0.089 (L) 0.350 - 4.500 uIU/mL    Comment: Performed by a 3rd Generation assay with a functional sensitivity of <=0.01 uIU/mL. Performed at [facility], [address] [place], [place] [address]   Troponin I - Add-On to previous collection     Status: Abnormal   Collection Time: 11/18/18  5:44 PM  Result Value Ref Range   Troponin I 3.36 (HH) <0.03 ng/mL    Comment: CRITICAL RESULT CALLED TO, READ BACK BY AND VERIFIED WITH BRITTNEY SIMPSON @2030  11/18/2018 TTG Performed at [facility], [address] [place], [place] [address]   CBC     Status: Abnormal   Collection Time: 11/18/18  6:45 PM  Result Value Ref Range   WBC 8.1 4.0 - 10.5 K/uL   RBC 3.95 3.87 - 5.11 MIL/uL   Hemoglobin 11.7 (L) 12.0 - 15.0 g/dL   HCT 37.0 36.0 - 46.0 %   MCV 93.7 80.0 - 100.0 fL   MCH 29.6 26.0 - 34.0 pg   MCHC 31.6 30.0 - 36.0 g/dL   RDW 13.3 11.5 - 15.5 %   Platelets 227 150 - 400 K/uL   nRBC 0.0 0.0 - 0.2 %    Comment: Performed at [facility], [address] [place], [place] [address]  APTT     Status: None   Collection Time: 11/18/18  9:47 PM  Result Value Ref Range   aPTT 32 24 - 36 seconds    Comment: Performed at [facility], [address], [place], [place] [address]  Protime-INR     Status: None   Collection Time: 11/18/18  9:47 PM  Result Value Ref Range   Prothrombin Time 12.8 11.4 - 15.2 seconds   INR 0.97      Comment: Performed at [facility], [address] [place], [place] [address]  Troponin I - ONCE - STAT     Status: None   Collection Time: 11/18/18 11:23 PM  Result Value Ref Range   Troponin I <0.03 <0.03 ng/mL    Comment: Performed at [facility], [place], [name] [address]  Heparin level (unfractionated)     Status: None   Collection Time: 11/19/18  6:05 AM  Result Value Ref Range   Heparin Unfractionated 0.68 0.30 - 0.70 IU/mL    Comment: (NOTE) If heparin results are below expected values, and patient dosage has  been confirmed, suggest follow up testing of antithrombin III levels. Performed at [facility], [address] [place], [place] [address]   Troponin I - ONCE - STAT     Status: None   Collection Time: 11/19/18 10:49 AM  Result Value Ref Range   Troponin I <0.03 <0.03 ng/mL    Comment: Performed at [facility], [address] [place], [place] [address]    No results found.  Review of Systems  Constitutional: Positive for diaphoresis and malaise/fatigue.  HENT: Negative.   Eyes: Negative.   Respiratory: Negative.   Cardiovascular: Positive for chest pain.  Gastrointestinal: Negative.   Genitourinary: Negative.   Musculoskeletal: Positive for falls and myalgias.  Skin: Negative.   Neurological: Positive for dizziness, loss of consciousness and weakness.  Endo/Heme/Allergies: Negative.   Psychiatric/Behavioral: Negative.    Blood pressure (!) 120/97, pulse 64, temperature 98.7 F (37.1 C), temperature source Oral,  resp. rate 18, height 5' (1.524 m), weight 52.9 kg, SpO2 96 %. Physical Exam  Nursing note and vitals reviewed. Constitutional: She is oriented to person, place, and time. She appears well-developed and well-nourished.  HENT:  Head: Normocephalic and atraumatic.  Eyes: Pupils are equal, round, and reactive to light. Conjunctivae and EOM are normal.  Neck: Normal range of motion. Neck  supple.  Cardiovascular: Normal rate, regular rhythm and normal heart sounds.  Respiratory: Effort normal and breath sounds normal.  GI: Soft. Bowel sounds are normal.  Musculoskeletal: Normal range of motion.  Neurological: She is alert and oriented to person, place, and time. She has normal reflexes.  Skin: Skin is warm and dry.  Psychiatric: She has a normal mood and affect.    Assessment/Plan: Syncope Left arm pain Hypertension Borderline troponin GERD Hypothyroidism History of thyroid cancer Prolonged QT Transient hypotension . Plan Agree with admit to telemetry Follow-up EKGs troponins Confusing troponins with a higher now 2 negatives suspect it may be an error Agree with carotid Dopplers Consider neurology evaluation Would recommend an event monitor Have patient follow-up with cardiology this week in the office with Dr. Ubaldo Glassing on Tuesday  Rajiv Parlato D [place] 11/19/2018, 12:26 PM

## 2018-11-19 NOTE — Discharge Summary (Signed)
[facility] NAME: Laura Walters    MR#:  [id number]  DATE OF BIRTH:  03/04/1933  DATE OF ADMISSION:  11/18/2018 ADMITTING PHYSICIAN: Henreitta Leber, MD  DATE OF DISCHARGE: No discharge date for patient encounter.  PRIMARY CARE PHYSICIAN: Idelle Crouch, MD   ADMISSION DIAGNOSIS:  NSTEMI (non-ST elevated myocardial infarction) ([place]) [I21.4] Syncope, unspecified syncope type [R55]  DISCHARGE DIAGNOSIS:  Syncope Left arm pain Hypertension Borderline troponin  SECONDARY DIAGNOSIS:   Past Medical History:  Diagnosis Date  . Arthritis   . Asthma   . Back pain   . Cancer of thyroid ([place]) 03/30/2015  . Cardiomyopathy ([place])    idiopathic  . Chest pain    non cardiac  . Chokes    easily   swallow test negative  . Constipation   . DDD (degenerative disc disease), lumbar   . Disc disorder   . GERD (gastroesophageal reflux disease)   . H/O wheezing    advair as needed  . Hypertension   . Hypothyroidism   . IBS (irritable bowel syndrome)   . Neuropathy   . Nocturia   . RAD (reactive airway disease)   . Rectocele   . Seasonal allergies   . Spinal stenosis   . Urge incontinence   . Vaginal atrophy   . Vaginal polyp      ADMITTING HISTORY Laura Walters  is a 85 y.o. female with a known history of hypertension, hypothyroidism, GERD, history of thyroid cancer who was recently in the hospital secondary to syncope thought to be secondary to prolonged QT from patient being on ciprofloxacin, who returns to the hospital after a recurrent syncopal episode and noted to have an elevated troponin of 3.3.  Patient on her previous admission had a Holter monitor which was essentially normal.  Her antibiotics were switched and she was asymptomatic and discharged home.  Today she was baking a cake and became somewhat dizzy and lightheaded and walked herself to her living room and sat down on the couch.  The next thing she remembers was on being  on the floor and may be 45 minutes to an hour had expired.  Patient does states she had some left arm pain while she was on the floor and had some diaphoresis and some nausea but no vomiting.  She had the med alert button and EMS was called and she was brought to the ER for further evaluation.  Patient has had no syncopal episodes while here in the ER.  On routine blood work she was noted to have elevated troponin of 3.3.  She presently denies any chest pain, dizziness, nausea vomiting, shortness of breath.  She does say that she has been having worsening exertional dyspnea over the past week to 2 weeks.  HOSPITAL COURSE:  Patient admitted to telemetry.  First set of troponin was negative.  Second set of troponin was elevated to 3.36.  Next 2 sets of troponin were all normal.  Cardiology evaluation was done.  Patient was anticoagulated with IV heparin drip during hospitalization. according to Dr. Clayborn Bigness he was more concerned with syncope.  Patient has an outpatient carotid ultrasound on Tuesday.  Patient will follow-up with his cardiologist Dr. Ubaldo Glassing in the clinic on Tuesday.  No further cardiac work-up and intervention recommended within hospitalization.  Patient will be discharged home.  CONSULTS OBTAINED:  Treatment Team:  Yolonda Kida, MD  DRUG ALLERGIES:   Allergies  Allergen Reactions  .  Acetaminophen-Codeine Other (See Comments)  . Aleve [Naproxen] Hives    Causes dizziness  . Penicillins Hives    Has patient had a PCN reaction causing immediate rash, facial/tongue/throat swelling, SOB or lightheadedness with hypotension: hives Has patient had a PCN reaction causing severe rash involving mucus membranes or skin necrosis: no Has patient had a PCN reaction that required hospitalization no Has patient had a PCN reaction occurring within the last 10 years: no If all of the above answers are "NO", then may proceed with Cephalosporin use.  . Sulfa Antibiotics Hives  . Codeine Hives,  Itching and Other (See Comments)    dizziness    DISCHARGE MEDICATIONS:   Allergies as of 11/19/2018      Reactions   Acetaminophen-codeine Other (See Comments)   Aleve [naproxen] Hives   Causes dizziness   Penicillins Hives   Has patient had a PCN reaction causing immediate rash, facial/tongue/throat swelling, SOB or lightheadedness with hypotension: hives Has patient had a PCN reaction causing severe rash involving mucus membranes or skin necrosis: no Has patient had a PCN reaction that required hospitalization no Has patient had a PCN reaction occurring within the last 10 years: no If all of the above answers are "NO", then may proceed with Cephalosporin use.   Sulfa Antibiotics Hives   Codeine Hives, Itching, Other (See Comments)   dizziness      Medication List    TAKE these medications   acetaminophen 325 MG tablet Commonly known as:  TYLENOL Take 650 mg by mouth daily.   ADVAIR DISKUS 250-50 MCG/DOSE Aepb Generic drug:  Fluticasone-Salmeterol Inhale 1 puff into the lungs 2 (two) times daily.   diphenhydrAMINE 25 MG tablet Commonly known as:  BENADRYL Take 25 mg by mouth every 6 (six) hours as needed.   docusate sodium 100 MG capsule Commonly known as:  COLACE Take 100-200 mg by mouth 2 (two) times daily.   DULoxetine 60 MG capsule Commonly known as:  CYMBALTA Take 60 mg by mouth daily.   ferrous sulfate 325 (65 FE) MG tablet Take 325 mg by mouth daily with breakfast.   gabapentin 400 MG capsule Commonly known as:  NEURONTIN Take 800 mg by mouth 2 (two) times daily.   levothyroxine 50 MCG tablet Commonly known as:  SYNTHROID, LEVOTHROID Take 50 mcg by mouth once a week. Take in the morning of (Wednesday) day 7.   levothyroxine 100 MCG tablet Commonly known as:  SYNTHROID, LEVOTHROID Take 100 mcg by mouth. Take 100 mcg every morning for 6 days and 50 mcg in the morning on day 7.   MAGNESIUM PO Take 250 mg by mouth daily.   omeprazole 40 MG  capsule Commonly known as:  PRILOSEC Take 40 mg by mouth daily.   potassium chloride 10 MEQ tablet Commonly known as:  K-DUR,KLOR-CON Take 10 mEq by mouth daily.   PRESERVISION AREDS 2 Caps Take 1 capsule by mouth 2 (two) times daily.   traMADol 50 MG tablet Commonly known as:  ULTRAM Take 50 mg by mouth every 6 (six) hours as needed.       Today  Patient seen today No chest pain No shortness of breath Hemodynamically stable  VITAL SIGNS:  Blood pressure (!) 120/97, pulse 64, temperature 98.7 F (37.1 C), temperature source Oral, resp. rate 18, height 5' (1.524 m), weight 52.9 kg, SpO2 96 %.  I/O:    Intake/Output Summary (Last 24 hours) at 11/19/2018 1247 Last data filed at 11/19/2018 0851 Gross per 24  hour  Intake 27.9 ml  Output 800 ml  Net -772.1 ml    PHYSICAL EXAMINATION:  Physical Exam  GENERAL:  85 y.o.-year-old patient lying in the bed with no acute distress.  LUNGS: Normal breath sounds bilaterally, no wheezing, rales,rhonchi or crepitation. No use of accessory muscles of respiration.  CARDIOVASCULAR: S1, S2 normal. No murmurs, rubs, or gallops.  ABDOMEN: Soft, non-tender, non-distended. Bowel sounds present. No organomegaly or mass.  NEUROLOGIC: Moves all 4 extremities. PSYCHIATRIC: The patient is alert and oriented x 3.  SKIN: No obvious rash, lesion, or ulcer.   DATA REVIEW:   CBC Recent Labs  Lab 11/18/18 1845  WBC 8.1  HGB 11.7*  HCT 37.0  PLT 227    Chemistries  Recent Labs  Lab 11/18/18 1744  NA 134*  K 4.8  CL 104  CO2 23  GLUCOSE 106*  BUN 17  CREATININE 0.85  CALCIUM 7.8*  MG 2.5*    Cardiac Enzymes Recent Labs  Lab 11/19/18 1049  TROPONINI <0.03    Microbiology Results  Results for orders placed or performed during the hospital encounter of 11/03/18  Urine Culture     Status: Abnormal   Collection Time: 11/03/18  3:21 PM  Result Value Ref Range Status   Specimen Description URINE, RANDOM  Final   Special  Requests   Final    NONE Performed at [facility], [address] [place], [place] [address]    Culture <10,000 COLONIES/mL INSIGNIFICANT GROWTH (A)  Final   Report Status 11/04/2018 FINAL  Final    RADIOLOGY:  No results found.  Follow up with PCP in 1 week.  Management plans discussed with the patient, family and they are in agreement.  CODE STATUS: DNR Code Status History    Date Active Date Inactive Code Status Order ID Comments User Context   11/03/2018 1849 11/04/2018 1804 DNR [id number]  [name], MD Inpatient   05/19/2015 1612 05/19/2015 2013 Full Code [id number]  Dereck Leep, MD Inpatient    Questions for Most Recent Historical Code Status (Order [id number])    Question Answer Comment   In the event of cardiac or respiratory ARREST Do not call a "code blue"    In the event of cardiac or respiratory ARREST Do not perform Intubation, CPR, defibrillation or ACLS    In the event of cardiac or respiratory ARREST Use medication by any route, position, wound care, and other measures to relive pain and suffering. May use oxygen, suction and manual treatment of airway obstruction as needed for comfort.         Advance Directive Documentation     Most Recent Value  Type of Advance Directive  Living will  Pre-existing out of facility DNR order (yellow form or pink MOST form)  -  "MOST" Form in Place?  -      TOTAL TIME TAKING CARE OF THIS PATIENT ON DAY OF DISCHARGE: more than 35 minutes.   Saundra Shelling M.D on 11/19/2018 at 12:47 PM  Between 7am to 6pm - Pager - [phone]  After 6pm go to [email or web address] - password [email or web address] [facility]  Office  [phone]  CC: Primary care physician; Idelle Crouch, MD  Note: This dictation was prepared with Dragon dictation along with smaller phrase technology. Any transcriptional errors that result from this process are unintentional.

## 2018-11-20 ENCOUNTER — Ambulatory Visit: Payer: Medicare Other

## 2018-11-20 LAB — URINE CULTURE: Culture: >=100000 — AB

## 2018-11-21 ENCOUNTER — Emergency Department: Payer: Medicare Other

## 2018-11-21 ENCOUNTER — Observation Stay: Payer: Medicare Other

## 2018-11-21 ENCOUNTER — Other Ambulatory Visit: Payer: Self-pay

## 2018-11-21 ENCOUNTER — Observation Stay
Admission: EM | Admit: 2018-11-21 | Discharge: 2018-11-22 | Disposition: A | Discharge status: Home / Self Care | Payer: Medicare Other | Attending: Internal Medicine | Admitting: Internal Medicine

## 2018-11-21 ENCOUNTER — Encounter: Payer: Self-pay | Admitting: Emergency Medicine

## 2018-11-21 DIAGNOSIS — K589 Irritable bowel syndrome without diarrhea: Secondary | ICD-10-CM | POA: Insufficient documentation

## 2018-11-21 DIAGNOSIS — E782 Mixed hyperlipidemia: Secondary | ICD-10-CM | POA: Insufficient documentation

## 2018-11-21 DIAGNOSIS — Z85850 Personal history of malignant neoplasm of thyroid: Secondary | ICD-10-CM | POA: Diagnosis not present

## 2018-11-21 DIAGNOSIS — M48061 Spinal stenosis, lumbar region without neurogenic claudication: Secondary | ICD-10-CM | POA: Insufficient documentation

## 2018-11-21 DIAGNOSIS — C7800 Secondary malignant neoplasm of unspecified lung: Secondary | ICD-10-CM | POA: Insufficient documentation

## 2018-11-21 DIAGNOSIS — I700 Atherosclerosis of aorta: Secondary | ICD-10-CM | POA: Insufficient documentation

## 2018-11-21 DIAGNOSIS — M5136 Other intervertebral disc degeneration, lumbar region: Secondary | ICD-10-CM | POA: Insufficient documentation

## 2018-11-21 DIAGNOSIS — E039 Hypothyroidism, unspecified: Secondary | ICD-10-CM | POA: Diagnosis not present

## 2018-11-21 DIAGNOSIS — G629 Polyneuropathy, unspecified: Secondary | ICD-10-CM | POA: Diagnosis not present

## 2018-11-21 DIAGNOSIS — M8580 Other specified disorders of bone density and structure, unspecified site: Secondary | ICD-10-CM | POA: Insufficient documentation

## 2018-11-21 DIAGNOSIS — K219 Gastro-esophageal reflux disease without esophagitis: Secondary | ICD-10-CM | POA: Diagnosis not present

## 2018-11-21 DIAGNOSIS — I429 Cardiomyopathy, unspecified: Secondary | ICD-10-CM | POA: Diagnosis not present

## 2018-11-21 DIAGNOSIS — I959 Hypotension, unspecified: Principal | ICD-10-CM | POA: Insufficient documentation

## 2018-11-21 DIAGNOSIS — K449 Diaphragmatic hernia without obstruction or gangrene: Secondary | ICD-10-CM | POA: Insufficient documentation

## 2018-11-21 DIAGNOSIS — J45909 Unspecified asthma, uncomplicated: Secondary | ICD-10-CM | POA: Insufficient documentation

## 2018-11-21 DIAGNOSIS — R55 Syncope and collapse: Secondary | ICD-10-CM

## 2018-11-21 DIAGNOSIS — R001 Bradycardia, unspecified: Secondary | ICD-10-CM | POA: Diagnosis present

## 2018-11-21 DIAGNOSIS — Z8249 Family history of ischemic heart disease and other diseases of the circulatory system: Secondary | ICD-10-CM | POA: Diagnosis not present

## 2018-11-21 DIAGNOSIS — Z79899 Other long term (current) drug therapy: Secondary | ICD-10-CM | POA: Insufficient documentation

## 2018-11-21 DIAGNOSIS — K7689 Other specified diseases of liver: Secondary | ICD-10-CM | POA: Insufficient documentation

## 2018-11-21 DIAGNOSIS — Z79890 Hormone replacement therapy (postmenopausal): Secondary | ICD-10-CM | POA: Diagnosis not present

## 2018-11-21 DIAGNOSIS — I129 Hypertensive chronic kidney disease with stage 1 through stage 4 chronic kidney disease, or unspecified chronic kidney disease: Secondary | ICD-10-CM | POA: Diagnosis not present

## 2018-11-21 DIAGNOSIS — N182 Chronic kidney disease, stage 2 (mild): Secondary | ICD-10-CM | POA: Insufficient documentation

## 2018-11-21 LAB — CBC
HCT: 37 % (ref 36.0–46.0)
Hemoglobin: 11.6 g/dL — ABNORMAL LOW (ref 12.0–15.0)
MCH: 30.1 pg (ref 26.0–34.0)
MCHC: 31.4 g/dL (ref 30.0–36.0)
MCV: 96.1 fL (ref 80.0–100.0)
Platelets: 269 10*3/uL (ref 150–400)
RBC: 3.85 MIL/uL — ABNORMAL LOW (ref 3.87–5.11)
RDW: 13.6 % (ref 11.5–15.5)
WBC: 7.9 10*3/uL (ref 4.0–10.5)
nRBC: 0 % (ref 0.0–0.2)

## 2018-11-21 LAB — BASIC METABOLIC PANEL
Anion gap: 11 (ref 5–15)
BUN: 16 mg/dL (ref 8–23)
CO2: 21 mmol/L — ABNORMAL LOW (ref 22–32)
Calcium: 8.2 mg/dL — ABNORMAL LOW (ref 8.9–10.3)
Chloride: 104 mmol/L (ref 98–111)
Creatinine, Ser: 1.05 mg/dL — ABNORMAL HIGH (ref 0.44–1.00)
GFR calc Af Amer: 57 mL/min — ABNORMAL LOW (ref >60)
GFR calc non Af Amer: 49 mL/min — ABNORMAL LOW (ref >60)
Glucose, Bld: 105 mg/dL — ABNORMAL HIGH (ref 70–99)
Potassium: 4.8 mmol/L (ref 3.5–5.1)
SODIUM: 136 mmol/L (ref 135–145)

## 2018-11-21 LAB — TROPONIN I
Troponin I: <0.03 ng/mL (ref <0.03)
Troponin I: <0.03 ng/mL (ref <0.03)
Troponin I: <0.03 ng/mL (ref <0.03)

## 2018-11-21 MED ORDER — HEPARIN SODIUM (PORCINE) 5000 UNIT/ML IJ SOLN
5000.0000 [IU] | Freq: Three times a day (TID) | INTRAMUSCULAR | Status: DC
  Administered 2018-11-21 – 2018-11-22 (×3): 5000 [IU] via SUBCUTANEOUS
  Filled 2018-11-21 (×3): qty 1

## 2018-11-21 MED ORDER — GABAPENTIN 400 MG PO CAPS
800.0000 mg | ORAL_CAPSULE | Freq: Two times a day (BID) | ORAL | Status: DC
  Administered 2018-11-21 – 2018-11-22 (×2): 800 mg via ORAL
  Filled 2018-11-21 (×2): qty 2

## 2018-11-21 MED ORDER — TRAMADOL HCL 50 MG PO TABS: 50.0000 mg | ORAL_TABLET | Freq: Four times a day (QID) | ORAL | Status: DC | PRN

## 2018-11-21 MED ORDER — DIPHENHYDRAMINE HCL 25 MG PO CAPS: 25.0000 mg | ORAL_CAPSULE | Freq: Four times a day (QID) | ORAL | Status: DC | PRN

## 2018-11-21 MED ORDER — DOCUSATE SODIUM 100 MG PO CAPS: 100.0000 mg | ORAL_CAPSULE | Freq: Two times a day (BID) | ORAL | Status: DC | PRN

## 2018-11-21 MED ORDER — FERROUS SULFATE 325 (65 FE) MG PO TABS
325.0000 mg | ORAL_TABLET | Freq: Every day | ORAL | Status: DC
  Filled 2018-11-21: qty 1

## 2018-11-21 MED ORDER — ACETAMINOPHEN 325 MG PO TABS
650.0000 mg | ORAL_TABLET | Freq: Every day | ORAL | Status: DC
  Administered 2018-11-22: 650 mg via ORAL
  Filled 2018-11-21: qty 2

## 2018-11-21 MED ORDER — DULOXETINE HCL 30 MG PO CPEP
60.0000 mg | ORAL_CAPSULE | Freq: Every day | ORAL | Status: DC
  Administered 2018-11-22: 60 mg via ORAL
  Filled 2018-11-21: qty 2

## 2018-11-21 MED ORDER — POTASSIUM CHLORIDE CRYS ER 10 MEQ PO TBCR
10.0000 meq | EXTENDED_RELEASE_TABLET | Freq: Every day | ORAL | Status: DC
  Administered 2018-11-22: 10 meq via ORAL
  Filled 2018-11-21: qty 1

## 2018-11-21 MED ORDER — IOHEXOL 350 MG/ML SOLN
75.0000 mL | Freq: Once | INTRAVENOUS | Status: AC | PRN
  Administered 2018-11-21: 75 mL via INTRAVENOUS

## 2018-11-21 MED ORDER — DOCUSATE SODIUM 100 MG PO CAPS
100.0000 mg | ORAL_CAPSULE | Freq: Two times a day (BID) | ORAL | Status: DC
  Administered 2018-11-21 – 2018-11-22 (×2): 100 mg via ORAL
  Filled 2018-11-21 (×2): qty 1

## 2018-11-21 MED ORDER — PANTOPRAZOLE SODIUM 40 MG PO TBEC
40.0000 mg | DELAYED_RELEASE_TABLET | Freq: Every day | ORAL | Status: DC
  Administered 2018-11-22: 40 mg via ORAL
  Filled 2018-11-21: qty 1

## 2018-11-21 MED ORDER — LEVOTHYROXINE SODIUM 100 MCG PO TABS
100.0000 ug | ORAL_TABLET | Freq: Every day | ORAL | Status: DC
  Administered 2018-11-22: 50 ug via ORAL
  Filled 2018-11-21: qty 1

## 2018-11-21 MED ORDER — LORATADINE 10 MG PO TABS
10.0000 mg | ORAL_TABLET | Freq: Every day | ORAL | Status: DC
  Administered 2018-11-21 – 2018-11-22 (×2): 10 mg via ORAL
  Filled 2018-11-21 (×2): qty 1

## 2018-11-21 MED ORDER — OCUVITE-LUTEIN PO CAPS
1.0000 | ORAL_CAPSULE | Freq: Two times a day (BID) | ORAL | Status: DC
  Administered 2018-11-21 – 2018-11-22 (×2): 1 via ORAL
  Filled 2018-11-21 (×3): qty 1

## 2018-11-21 NOTE — ED Provider Notes (Signed)
[facility] Emergency Department Provider Note       Time seen: ----------------------------------------- 1:03 PM on 11/21/2018 -----------------------------------------   I have reviewed the triage vital signs and the nursing notes.  HISTORY   Chief Complaint Hypotension    HPI Laura Walters is a 85 y.o. female with a history of asthma, cardiomyopathy, chest pain, constipation, GERD, hypertension, hypothyroidism, IBS, reactive airway disease who presents to the ED for hypotension that occurred during a stress test.  Patient had been having some left arm pain for which she was undergoing a stress test.  Blood pressure was markedly low on arrival to the ER with a systolic in the 15V.  Past Medical History:  Diagnosis Date  . Arthritis   . Asthma   . Back pain   . Cancer of thyroid ([place]) 03/30/2015  . Cardiomyopathy ([place])    idiopathic  . Chest pain    non cardiac  . Chokes    easily   swallow test negative  . Constipation   . DDD (degenerative disc disease), lumbar   . Disc disorder   . GERD (gastroesophageal reflux disease)   . H/O wheezing    advair as needed  . Hypertension   . Hypothyroidism   . IBS (irritable bowel syndrome)   . Neuropathy   . Nocturia   . RAD (reactive airway disease)   . Rectocele   . Seasonal allergies   . Spinal stenosis   . Urge incontinence   . Vaginal atrophy   . Vaginal polyp     Patient Active Problem List   Diagnosis Date Noted  . NSTEMI (non-ST elevated myocardial infarction) ([place]) 11/18/2018  . Syncope 11/03/2018  . Vaginal bleeding 08/02/2018  . Abrasion of vagina 08/02/2018  . Goals of care, counseling/discussion 01/05/2018  . Uterine prolapse 02/03/2017  . Pulmonary metastases ([place]) 04/09/2016  . RAD (reactive airway disease) 02/27/2016  . Recurrent sinus infections 01/31/2016  . H/O total knee replacement 01/20/2016  . S/P total knee arthroplasty 01/05/2016  . Metastatic cancer to cervical  lymph nodes ([place]) 01/01/2016  . Arthropathy, traumatic, knee 12/16/2015  . Cystocele, midline 12/02/2015  . Rectocele 12/02/2015  . Frequent UTI 11/26/2015  . Acquired hypothyroidism 10/13/2015  . Pituitary tumor 10/13/2015  . Degeneration of intervertebral disc of lumbar region 08/01/2015  . Lumbar canal stenosis 08/01/2015  . Neuritis or radiculitis due to rupture of lumbar intervertebral disc 08/01/2015  . LBP (low back pain) 03/30/2015  . Neuropathy ([place]) 03/30/2015  . Chest pain, non-cardiac 03/30/2015  . Neoplasm of pituitary gland 03/30/2015  . Cancer of thyroid ([place]) 03/30/2015  . Combined fat and carbohydrate induced hyperlipemia 10/17/2014  . Primary cardiomyopathy ([place]) 10/14/2014    Past Surgical History:  Procedure Laterality Date  . BREAST BIOPSY Right 86 and 92   2 bx-neg  . CATARACT EXTRACTION W/PHACO Right 05/19/2016   Procedure: CATARACT EXTRACTION PHACO AND INTRAOCULAR LENS PLACEMENT (IOC);  Surgeon: Estill Cotta, MD;  Location: [facility] ORS;  Service: Ophthalmology;  Laterality: Right;  Lot# [id number] [place]:   1:25.3 AP%:  24.9% CDE:  40.11  . CATARACT EXTRACTION W/PHACO Left 08/11/2016   Procedure: CATARACT EXTRACTION PHACO AND INTRAOCULAR LENS PLACEMENT (IOC);  Surgeon: Estill Cotta, MD;  Location: [facility] ORS;  Service: Ophthalmology;  Laterality: Left;  [place] 1.24 AP% 26.1 CDE 37.79 FLUID PACK LOT # [id number]  . DILATION AND CURETTAGE OF UTERUS    . HEMORROIDECTOMY     x 2  . JOINT REPLACEMENT  left hip  . KNEE ARTHROPLASTY Right 01/05/2016   Procedure: COMPUTER ASSISTED TOTAL KNEE ARTHROPLASTY;  Surgeon: Dereck Leep, MD;  Location: [facility] ORS;  Service: Orthopedics;  Laterality: Right;  . KNEE ARTHROSCOPY Right 05/19/2015   Procedure: Right knee arthrosocpy medail and lateral menisectomy, chondroplasty ;  Surgeon: Dereck Leep, MD;  Location: [facility] ORS;  Service: Orthopedics;  Laterality: Right;  . Left total hip arthroplasty    . PAROTIDECTOMY    .  PITUITARY SURGERY    . THYROID SURGERY     bx  . THYROIDECTOMY      Allergies Acetaminophen-codeine; Aleve [naproxen]; Penicillins; Sulfa antibiotics; and Codeine  Social History Social History   Tobacco Use  . Smoking status: Never Smoker  . Smokeless tobacco: Never Used  Substance Use Topics  . Alcohol use: No  . Drug use: No   Review of Systems Constitutional: Negative for fever. Cardiovascular: Negative for chest pain. Respiratory: Negative for shortness of breath. Gastrointestinal: Negative for abdominal pain, vomiting and diarrhea. Musculoskeletal: Positive for left arm pain Skin: Positive for diaphoresis Neurological: Negative for headaches, focal weakness or numbness.  All systems negative/normal/unremarkable except as stated in the HPI  ____________________________________________   PHYSICAL EXAM:  VITAL SIGNS: ED Triage Vitals  Enc Vitals Group     BP 11/21/18 1241 (!) 54/30     Pulse Rate 11/21/18 1241 (!) 55     Resp 11/21/18 1241 (!) 22     Temp --      Temp src --      SpO2 11/21/18 1241 97 %     Weight 11/21/18 1242 116 lb 11.2 oz (52.9 kg)     Height 11/21/18 1242 5' (1.524 m)     Head Circumference --      Peak Flow --      Pain Score 11/21/18 1242 0     Pain Loc --      Pain Edu? --      Excl. in [place]? --    Constitutional: Alert and oriented. Well appearing and in no distress. Eyes: Conjunctivae are normal. Normal extraocular movements. ENT      Head: Normocephalic and atraumatic.      Nose: No congestion/rhinnorhea.      Mouth/Throat: Mucous membranes are moist.      Neck: No stridor. Cardiovascular: Normal rate, regular rhythm. No murmurs, rubs, or gallops. Respiratory: Normal respiratory effort without tachypnea nor retractions. Breath sounds are clear and equal bilaterally. No wheezes/rales/rhonchi. Gastrointestinal: Soft and nontender. Normal bowel sounds Musculoskeletal: Nontender with normal range of motion in extremities. No  lower extremity tenderness nor edema. Neurologic:  Normal speech and language. No gross focal neurologic deficits are appreciated.  Skin:  Skin is warm, dry and intact. No rash noted. Psychiatric: Mood and affect are normal. Speech and behavior are normal.  ____________________________________________  EKG: Interpreted by me.  Sinus rhythm rate of 54 bpm, short PR interval, normal axis, borderline long QT  ____________________________________________  ED COURSE:  As part of my medical decision making, I reviewed the following data within the [place] History obtained from family if available, nursing notes, old chart and ekg, as well as notes from prior ED visits. Patient presented for hypotension during a stress test, we will assess with labs and imaging as indicated at this time.   Procedures ____________________________________________   LABS (pertinent positives/negatives)  Labs Reviewed  BASIC METABOLIC PANEL - Abnormal; Notable for the following components:      Result  Value   CO2 21 (*)    Glucose, Bld 105 (*)    Creatinine, Ser 1.05 (*)    Calcium 8.2 (*)    GFR calc non Af Amer 49 (*)    GFR calc Af Amer 57 (*)    All other components within normal limits  CBC - Abnormal; Notable for the following components:   RBC 3.85 (*)    Hemoglobin 11.6 (*)    All other components within normal limits  TROPONIN I    RADIOLOGY Images were viewed by me  Chest x-ray IMPRESSION: 1. Minimal left basilar atelectasis. 2. Previously demonstrated right basilar nodule. ____________________________________________   DIFFERENTIAL DIAGNOSIS   Vasovagal syncope, arrhythmia, MI, unstable angina, PE  FINAL ASSESSMENT AND PLAN  Hypotension, near syncope   Plan: The patient had presented for near syncope from a stress test with hypotension. Patient's labs did not reveal any acute process. Patient's imaging was reassuring.  She was bradycardic throughout her ER  stay here.  Initially she was with markedly hypotensive but this appears to have resolved.  No obvious etiology for her syncope at this point.  She was receiving a carotid Doppler when her blood pressure dropped today.  It is possible that carotid massage during ultrasound causes syncope.  I will discuss with the hospitalist for admission.   Laurence Aly, MD    Note: This note was generated in part or whole with voice recognition software. Voice recognition is usually quite accurate but there are transcription errors that can and very often do occur. I apologize for any typographical errors that were not detected and corrected.     Earleen Newport, MD 11/21/18 1340

## 2018-11-21 NOTE — ED Triage Notes (Signed)
Pt arrives from [facility] due to hypotension during stress test.

## 2018-11-21 NOTE — ED Notes (Signed)
Sent rainbow to lab. 

## 2018-11-21 NOTE — Progress Notes (Signed)
Family Meeting Note  Advance Directive:yes  Today a meeting took place with the Patient.  The following clinical team members were present during this meeting:MD  The following were discussed:Patient's diagnosis: Recurrent Braycardia, dizziness, Lung nodules. , Patient's progosis: Unable to determine and Goals for treatment: Full Code  Additional follow-up to be provided: Cardiology  Time spent during discussion:20 minutes  [name], MD

## 2018-11-21 NOTE — Consult Note (Signed)
[facility] Consultation Note  Patient ID: Laura Walters, MRN: [id number], DOB/AGE: 1932/11/28 85 y.o. Admit date: 11/21/2018   Date of Consult: 11/21/2018 Primary Physician: Idelle Crouch, MD Primary Cardiologist: Ubaldo Glassing  Chief Complaint:  Chief Complaint  Patient presents with  . Hypotension   Reason for Consult: Syncope  HPI: 85 y.o. female with known minimal vascular disease with chronic kidney disease stage II and no evidence of significant hypertension hyperlipidemia requiring further intervention having significant episodes of bradycardia and syncope.  Her typical episodes have occurred when she was at the beauty shop sitting with her hair dryer on as well as when getting a carotid Doppler assessment.  The patient also has had other syncopal episodes where she has been using her left arm and doing other activities.  The patient always end up is having heart rate into the 40 bpm range with the syncopal episodes.  She has had an echocardiogram showing normal LV systolic function with ejection fraction of 55%.  She has had EKG showing normal sinus rhythm.  She has had Holter monitors for which did not catch a syncopal episode which showed no evidence of rhythm disturbances.  She additionally has had a CT scan of her brain with no evidence of changes and minimal atherosclerosis by carotid angiogram but no evidence of significant stenosis.  Currently the patient does feel well with no evidence of myocardial infarction  Past Medical History:  Diagnosis Date  . Arthritis   . Asthma   . Back pain   . Cancer of thyroid ([place]) 03/30/2015  . Cardiomyopathy ([place])    idiopathic  . Chest pain    non cardiac  . Chokes    easily   swallow test negative  . Constipation   . DDD (degenerative disc disease), lumbar   . Disc disorder   . GERD (gastroesophageal reflux disease)   . H/O wheezing    advair as needed  . Hypertension   . Hypothyroidism   . IBS (irritable bowel syndrome)    . Neuropathy   . Nocturia   . RAD (reactive airway disease)   . Rectocele   . Seasonal allergies   . Spinal stenosis   . Urge incontinence   . Vaginal atrophy   . Vaginal polyp       Surgical History:  Past Surgical History:  Procedure Laterality Date  . BREAST BIOPSY Right 86 and 92   2 bx-neg  . CATARACT EXTRACTION W/PHACO Right 05/19/2016   Procedure: CATARACT EXTRACTION PHACO AND INTRAOCULAR LENS PLACEMENT (IOC);  Surgeon: Estill Cotta, MD;  Location: [facility] ORS;  Service: Ophthalmology;  Laterality: Right;  Lot# [id number] [place]:   1:25.3 AP%:  24.9% CDE:  40.11  . CATARACT EXTRACTION W/PHACO Left 08/11/2016   Procedure: CATARACT EXTRACTION PHACO AND INTRAOCULAR LENS PLACEMENT (IOC);  Surgeon: Estill Cotta, MD;  Location: [facility] ORS;  Service: Ophthalmology;  Laterality: Left;  [place] 1.24 AP% 26.1 CDE 37.79 FLUID PACK LOT # [id number]  . DILATION AND CURETTAGE OF UTERUS    . HEMORROIDECTOMY     x 2  . JOINT REPLACEMENT     left hip  . KNEE ARTHROPLASTY Right 01/05/2016   Procedure: COMPUTER ASSISTED TOTAL KNEE ARTHROPLASTY;  Surgeon: Dereck Leep, MD;  Location: [facility] ORS;  Service: Orthopedics;  Laterality: Right;  . KNEE ARTHROSCOPY Right 05/19/2015   Procedure: Right knee arthrosocpy medail and lateral menisectomy, chondroplasty ;  Surgeon: Dereck Leep, MD;  Location: [facility] ORS;  Service: Orthopedics;  Laterality: Right;  . Left total hip arthroplasty    . PAROTIDECTOMY    . PITUITARY SURGERY    . THYROID SURGERY     bx  . THYROIDECTOMY       Home Meds: Prior to Admission medications   Medication Sig Start Date End Date Taking? Authorizing Provider  acetaminophen (TYLENOL) 325 MG tablet Take 650 mg by mouth daily.   Yes [provider]  diphenhydrAMINE (BENADRYL) 25 MG tablet Take 25 mg by mouth every 6 (six) hours as needed.   Yes [provider]  docusate sodium (COLACE) 100 MG capsule Take 100-200 mg by mouth 2 (two) times daily.    Yes  [provider]  DULoxetine (CYMBALTA) 60 MG capsule Take 60 mg by mouth daily.  02/02/16  Yes [provider]  ferrous sulfate 325 (65 FE) MG tablet Take 325 mg by mouth daily with breakfast.    Yes [provider]  gabapentin (NEURONTIN) 400 MG capsule Take 800 mg by mouth 2 (two) times daily.  02/09/16  Yes [provider]  levothyroxine (SYNTHROID, LEVOTHROID) 100 MCG tablet Take 100 mcg by mouth daily.  10/04/17  Yes [provider]  levothyroxine (SYNTHROID, LEVOTHROID) 50 MCG tablet Take 50 mcg by mouth once a week. Take in the morning of (Wednesday) day 7.   Yes [provider]  MAGNESIUM PO Take 250 mg by mouth daily.    Yes [provider]  Multiple Vitamins-Minerals (PRESERVISION AREDS 2) CAPS Take 1 capsule by mouth 2 (two) times daily.   Yes [provider]  omeprazole (PRILOSEC) 40 MG capsule Take 40 mg by mouth daily.  02/16/16  Yes [provider]  potassium chloride (K-DUR,KLOR-CON) 10 MEQ tablet Take 10 mEq by mouth daily.    Yes [provider]  traMADol (ULTRAM) 50 MG tablet Take 50 mg by mouth every 6 (six) hours as needed.   Yes [provider]    Inpatient Medications:  . [START ON 11/22/2018] acetaminophen  650 mg Oral Daily  . docusate sodium  100 mg Oral BID  . [START ON 11/22/2018] DULoxetine  60 mg Oral Daily  . [START ON 11/22/2018] ferrous sulfate  325 mg Oral Q breakfast  . gabapentin  800 mg Oral BID  . heparin  5,000 Units Subcutaneous Q8H  . [START ON 11/22/2018] levothyroxine  100 mcg Oral Q0600  . multivitamin-lutein  1 capsule Oral BID  . [START ON 11/22/2018] pantoprazole  40 mg Oral Daily  . [START ON 11/22/2018] potassium chloride  10 mEq Oral Daily     Allergies:  Allergies  Allergen Reactions  . Acetaminophen-Codeine Other (See Comments)  . Aleve [Naproxen] Hives    Causes dizziness  . Penicillins Hives    Has patient had a PCN reaction causing immediate  rash, facial/tongue/throat swelling, SOB or lightheadedness with hypotension: hives Has patient had a PCN reaction causing severe rash involving mucus membranes or skin necrosis: no Has patient had a PCN reaction that required hospitalization no Has patient had a PCN reaction occurring within the last 10 years: no If all of the above answers are "NO", then may proceed with Cephalosporin use.  . Sulfa Antibiotics Hives  . Codeine Hives, Itching and Other (See Comments)    dizziness    Social History   Socioeconomic History  . Marital status: Widowed    Spouse name: Not on file  . Number of children: Not on file  . Years of education: Not on  file  . Highest education level: Not on file  Occupational History  . Not on file  Social Needs  . Financial resource strain: Not on file  . Food insecurity:    Worry: Not on file    Inability: Not on file  . Transportation needs:    Medical: Not on file    Non-medical: Not on file  Tobacco Use  . Smoking status: Never Smoker  . Smokeless tobacco: Never Used  Substance and Sexual Activity  . Alcohol use: No  . Drug use: No  . Sexual activity: Not Currently    Birth control/protection: Post-menopausal  Lifestyle  . Physical activity:    Days per week: Not on file    Minutes per session: Not on file  . Stress: Not on file  Relationships  . Social connections:    Talks on phone: Not on file    Gets together: Not on file    Attends religious service: Not on file    Active member of club or organization: Not on file    Attends meetings of clubs or organizations: Not on file    Relationship status: Not on file  . Intimate partner violence:    Fear of current or ex partner: Not on file    Emotionally abused: Not on file    Physically abused: Not on file    Forced sexual activity: Not on file  Other Topics Concern  . Not on file  Social History Narrative  . Not on file     Family History  Problem Relation Age of Onset  . Heart  disease Father   . Heart attack Father   . Diabetes Paternal Uncle   . Cancer Neg Hx   . Breast cancer Neg Hx      Review of Systems Positive for syncope Negative for: General:  chills, fever, night sweats or weight changes.  Cardiovascular: PND orthopnea positive for syncope dizziness  Dermatological skin lesions rashes Respiratory: Cough congestion Urologic: Frequent urination urination at night and hematuria Abdominal: negative for nausea, vomiting, diarrhea, bright red blood per rectum, melena, or hematemesis Neurologic: negative for visual changes, and/or hearing changes  All other systems reviewed and are otherwise negative except as noted above.  Labs: Recent Labs    11/18/18 2323 11/19/18 1049 11/21/18 1240 11/21/18 1600  TROPONINI <0.03 <0.03 <0.03 <0.03   Lab Results  Component Value Date   WBC 7.9 11/21/2018   HGB 11.6 (L) 11/21/2018   HCT 37.0 11/21/2018   MCV 96.1 11/21/2018   PLT 269 11/21/2018    Recent Labs  Lab 11/21/18 1240  NA 136  K 4.8  CL 104  CO2 21*  BUN 16  CREATININE 1.05*  CALCIUM 8.2*  GLUCOSE 105*   No results found for: CHOL, HDL, LDLCALC, TRIG No results found for: DDIMER  Radiology/Studies:  Dg Chest 2 View  Result Date: 11/03/2018 CLINICAL DATA:  Dyspnea.  Hypoxia.  Syncope this morning. EXAM: CHEST - 2 VIEW COMPARISON:  Chest x-rays dated 12/28/2017 and 01/03/2015, PET-CT dated 01/17/2018 and chest CT dated 07/04/2017 FINDINGS: There are numerous bilateral pulmonary nodules which appears essentially unchanged since the PET-CT scan dated 08/07/2018. The patient has numerous pulmonary nodules visible on the PET-CT there are not visible on chest x-ray. Heart size and vascularity are normal. Aortic atherosclerosis. No infiltrates or effusions. Fairly severe thoracolumbar scoliosis with convexity to the right. Small hiatal hernia. No acute bone abnormality. IMPRESSION: No acute abnormality. Numerous stable bilateral pulmonary nodules  consistent with metastatic disease. Aortic Atherosclerosis (ICD10-I70.0). Electronically Signed   By: Lorriane Shire M.D.   On: 11/03/2018 14:01   Ct Head Wo Contrast  Result Date: 11/04/2018 CLINICAL DATA:  Syncope, simple, with normal neuro exam EXAM: CT HEAD WITHOUT CONTRAST TECHNIQUE: Contiguous axial images were obtained from the base of the skull through the vertex without intravenous contrast. COMPARISON:  Head CT 08/07/2018 FINDINGS: Brain: No acute infarct, hemorrhage, hydrocephalus, or collection. Age normal brain volume. Isodense mass in the left sella, left cavernous sinus region, left suprasellar cistern, and left Meckel's cave region. This causes mass effect on the inferior left cerebrum. Patient has history of pituitary tumor. Mild chronic small vessel ischemia in the cerebral white matter. Vascular: No hyperdense vessel or unexpected calcification. Skull: No acute or aggressive finding. Sinuses/Orbits: Changes of trans-sphenoidal surgery. Partial coverage of the sinuses shows extensive ethmoid and maxillary opacification with sclerotic wall thickening from chronic inflammation. IMPRESSION: 1. No acute finding. 2. Grossly similar appearance of sellar, suprasellar, and left cavernous sinus region mass when compared to a 2018 brain MRI. Patient has history of pituitary tumor. Electronically Signed   By: Monte Fantasia M.D.   On: 11/04/2018 11:15   Ct Angio Neck W Or Wo Contrast  Result Date: 11/21/2018 CLINICAL DATA:  Hypotension during a stress test. Near syncope. History of thyroid cancer. EXAM: CT ANGIOGRAPHY NECK TECHNIQUE: Multidetector CT imaging of the neck was performed using the standard protocol during bolus administration of intravenous contrast. Multiplanar CT image reconstructions and MIPs were obtained to evaluate the vascular anatomy. Carotid stenosis measurements (when applicable) are obtained utilizing NASCET criteria, using the distal internal carotid diameter as the  denominator. CONTRAST:  34mL OMNIPAQUE IOHEXOL 350 MG/ML SOLN COMPARISON:  PET-CT 08/07/2018. Soft tissue neck CT 12/05/2013. FINDINGS: Aortic arch: Standard 3 vessel aortic arch with mild atherosclerotic plaque. Widely patent arch vessel origins. Soft plaque throughout the proximal left subclavian artery resulting in less than 50% stenosis. Right carotid system: Patent with mild plaque at the carotid bifurcation. No evidence of stenosis or dissection. Left carotid system: Patent with a focal, less than 50% stenosis of the mid common carotid artery. Predominantly calcified plaque at the carotid bifurcation not resulting in stenosis. No evidence of dissection. Vertebral arteries: Patent and codominant without evidence of flow limiting stenosis or dissection. Tortuous proximal left V1 segments with up to mild narrowing. Skeleton: Advanced disc and facet degeneration in the cervical and upper thoracic spine. Moderately advanced median C1-2 arthropathy. Other neck: Fatty atrophy of the submandibular and parotid glands. Status post thyroidectomy similar appearance of left lower neck lymph nodes compared to the prior PET-CT. 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 Upper chest: Mild biapical lung scarring. IMPRESSION: 1. Mild atherosclerosis without significant cervical carotid or vertebral artery stenosis or dissection. 2.  Aortic Atherosclerosis (ICD10-I70.0). Electronically Signed   By: Logan Bores M.D.   On: 11/21/2018 15:52   Ct Angio Chest Pe W And/or Wo Contrast  Result Date: 11/03/2018 CLINICAL DATA:  Near syncope today. Feeling lightheaded. History of thyroid cancer with lung metastasis. EXAM: CT ANGIOGRAPHY CHEST WITH CONTRAST TECHNIQUE: Multidetector CT imaging of the chest was performed using the standard protocol during bolus administration of intravenous contrast. Multiplanar CT image reconstructions and MIPs were obtained to evaluate the vascular anatomy. CONTRAST:   73mL OMNIPAQUE IOHEXOL 350 MG/ML SOLN COMPARISON:  July 04, 2017 FINDINGS: Cardiovascular: Satisfactory opacification of the pulmonary arteries to the segmental level. No evidence of pulmonary  embolism. Normal heart size. No pericardial effusion. Mediastinum/Nodes: No enlarged mediastinal, hilar, or axillary lymph nodes. Thyroid gland, trachea, and esophagus demonstrate no significant findings. Lungs/Pleura: There are multiple bilateral pulmonary nodules consistent with patient's known thyroid metastasis to the lungs. Which are viewed nodules are unchanged compared prior exam. However, the largest nodule in the left lower lobe is increased in size compared to prior exam of 2018 currently measuring 1.6 x 1.7 cm. There is no pleural effusion. Upper Abdomen: There is a cyst in the dome of the liver unchanged compared to prior exam. Moderate hiatal hernia is identified. Musculoskeletal: No definite focal discrete lytic or blastic lesions are identified. Degenerative joint changes of the spine. Review of the MIP images confirms the above findings. IMPRESSION: No pulmonary embolus. Multiple pulmonary nodules in both lungs consistent with known metastasis. Majority of the nodules are not significantly changed compared to prior exam. However, the largest nodule in the left lower lobe is increased in size currently measuring 1.6 x 1.7 cm. Electronically Signed   By: Abelardo Diesel M.D.   On: 11/03/2018 15:05   Dg Chest Port 1 View  Result Date: 11/21/2018 CLINICAL DATA:  Chest pain. EXAM: PORTABLE CHEST 1 VIEW COMPARISON:  11/03/2018. Chest CTA dated 11/03/2018 FINDINGS: Previously demonstrated nodule at the right lung base. Other previously demonstrated nodules are not visible. Minimal linear atelectasis at the left lung base. Clear right lung. Normal sized heart. Diffuse osteopenia IMPRESSION: 1. Minimal left basilar atelectasis. 2. Previously demonstrated right basilar nodule. Electronically Signed   By: Claudie Revering M.D.   On: 11/21/2018 13:25    EKG: Normal sinus rhythm  Weights: Filed Weights   11/21/18 1242  Weight: 52.9 kg     Physical Exam: Blood pressure (!) 157/70, pulse 68, temperature 97.6 F (36.4 C), temperature source Oral, resp. rate 18, height 5' (1.524 m), weight 52.9 kg, SpO2 100 %. Body mass index is 22.79 kg/m. General: Well developed, well nourished, in no acute distress. Head eyes ears nose throat: Normocephalic, atraumatic, sclera non-icteric, no xanthomas, nares are without discharge. No apparent thyromegaly and/or mass  Lungs: Normal respiratory effort.  no wheezes, no rales, no rhonchi.  Heart: RRR with normal S1 S2. no murmur gallop, no rub, PMI is normal size and placement, carotid upstroke normal without bruit, jugular venous pressure is normal Abdomen: Soft, non-tender, non-distended with normoactive bowel sounds. No hepatomegaly. No rebound/guarding. No obvious abdominal masses. Abdominal aorta is normal size without bruit Extremities: No edema. no cyanosis, no clubbing, no ulcers  Peripheral : 2+ bilateral upper extremity pulses, 2+ bilateral femoral pulses, 2+ bilateral dorsal pedal pulse Neuro: Alert and oriented. No facial asymmetry. No focal deficit. Moves all extremities spontaneously. Musculoskeletal: Normal muscle tone without kyphosis Psych:  Responds to questions appropriately with a normal affect.    Assessment: 85 year old female with chronic kidney disease and recurrent bradycardia dizziness and syncope without evidence of significant stenosis of carotid arteries vertebral arteries or subclavian arteries and a normal CT scan of the brain and normal echocardiogram  Plan: 1.  Further consideration of carotid sinus syndrome and the possibility of manipulation causing bradycardia and syncope although manipulation at the bedside did not cause this issue 2.  Continue monitoring for rhythm disturbances which have not been seen 3.  Ambulation and follow  for improvements of symptoms and possible further intervention depending on results of above  Signed, Corey Skains M.D. [facility] 11/21/2018, 7:24 PM

## 2018-11-21 NOTE — ED Notes (Signed)
Per the daughter, pt has been having headaches on the left side with vision changes in the left eye for a week. When pt is asked about it she denies but has been c/o this at home with caregiver.

## 2018-11-21 NOTE — ED Notes (Signed)
ED TO INPATIENT HANDOFF REPORT  ED Nurse Name and Phone #: Stanford Scotland [phone]  S Name/Age/Gender Laura Walters 85 y.o. female Room/Bed: ED10A/ED10A  Code Status   Code Status: Prior  Home/SNF/Other Home Patient oriented to: self, place, time and situation Is this baseline? Yes   Triage Complete: Triage complete  Chief Complaint hypotension  Triage Note Pt arrives from [facility] due to hypotension during stress test.    Allergies Allergies  Allergen Reactions  . Acetaminophen-Codeine Other (See Comments)  . Aleve [Naproxen] Hives    Causes dizziness  . Penicillins Hives    Has patient had a PCN reaction causing immediate rash, facial/tongue/throat swelling, SOB or lightheadedness with hypotension: hives Has patient had a PCN reaction causing severe rash involving mucus membranes or skin necrosis: no Has patient had a PCN reaction that required hospitalization no Has patient had a PCN reaction occurring within the last 10 years: no If all of the above answers are "NO", then may proceed with Cephalosporin use.  . Sulfa Antibiotics Hives  . Codeine Hives, Itching and Other (See Comments)    dizziness    Level of Care/Admitting Diagnosis ED Disposition    ED Disposition Condition [facility] Area: [place] [[id number]]  Level of Care: Telemetry [5]  Diagnosis: Bradycardia [[id number]]  Admitting Physician: [name] [[id number]]  Attending Physician: [name] [phone]  PT Class (Do Not Modify): Observation [104]  PT Acc Code (Do Not Modify): Observation [10022]       B Medical/Surgery History Past Medical History:  Diagnosis Date  . Arthritis   . Asthma   . Back pain   . Cancer of thyroid ([place]) 03/30/2015  . Cardiomyopathy ([place])    idiopathic  . Chest pain    non cardiac  . Chokes    easily   swallow test negative  . Constipation   . DDD (degenerative disc disease), lumbar   . Disc disorder   . GERD  (gastroesophageal reflux disease)   . H/O wheezing    advair as needed  . Hypertension   . Hypothyroidism   . IBS (irritable bowel syndrome)   . Neuropathy   . Nocturia   . RAD (reactive airway disease)   . Rectocele   . Seasonal allergies   . Spinal stenosis   . Urge incontinence   . Vaginal atrophy   . Vaginal polyp    Past Surgical History:  Procedure Laterality Date  . BREAST BIOPSY Right 86 and 92   2 bx-neg  . CATARACT EXTRACTION W/PHACO Right 05/19/2016   Procedure: CATARACT EXTRACTION PHACO AND INTRAOCULAR LENS PLACEMENT (IOC);  Surgeon: Estill Cotta, MD;  Location: [facility] ORS;  Service: Ophthalmology;  Laterality: Right;  Lot# [id number] [place]:   1:25.3 AP%:  24.9% CDE:  40.11  . CATARACT EXTRACTION W/PHACO Left 08/11/2016   Procedure: CATARACT EXTRACTION PHACO AND INTRAOCULAR LENS PLACEMENT (IOC);  Surgeon: Estill Cotta, MD;  Location: [facility] ORS;  Service: Ophthalmology;  Laterality: Left;  [place] 1.24 AP% 26.1 CDE 37.79 FLUID PACK LOT # [id number]  . DILATION AND CURETTAGE OF UTERUS    . HEMORROIDECTOMY     x 2  . JOINT REPLACEMENT     left hip  . KNEE ARTHROPLASTY Right 01/05/2016   Procedure: COMPUTER ASSISTED TOTAL KNEE ARTHROPLASTY;  Surgeon: Dereck Leep, MD;  Location: [facility] ORS;  Service: Orthopedics;  Laterality: Right;  . KNEE ARTHROSCOPY Right 05/19/2015   Procedure: Right knee arthrosocpy medail and lateral  menisectomy, chondroplasty ;  Surgeon: Dereck Leep, MD;  Location: [facility] ORS;  Service: Orthopedics;  Laterality: Right;  . Left total hip arthroplasty    . PAROTIDECTOMY    . PITUITARY SURGERY    . THYROID SURGERY     bx  . THYROIDECTOMY       A IV Location/Drains/Wounds Patient Lines/Drains/Airways Status   Active Line/Drains/Airways    Name:   Placement date:   Placement time:   Site:   Days:   Peripheral IV 11/21/18 Right Antecubital   11/21/18    1245    Antecubital   less than 1   Peripheral IV 11/21/18 Left Arm   11/21/18    1246    Arm    less than 1          Intake/Output Last 24 hours No intake or output data in the 24 hours ending 11/21/18 1820  Labs/Imaging Results for orders placed or performed during the hospital encounter of 11/21/18 (from the past 48 hour(s))  Basic metabolic panel     Status: Abnormal   Collection Time: 11/21/18 12:40 PM  Result Value Ref Range   Sodium 136 135 - 145 mmol/L   Potassium 4.8 3.5 - 5.1 mmol/L    Comment: HEMOLYSIS AT THIS LEVEL MAY AFFECT RESULT   Chloride 104 98 - 111 mmol/L   CO2 21 (L) 22 - 32 mmol/L   Glucose, Bld 105 (H) 70 - 99 mg/dL   BUN 16 8 - 23 mg/dL   Creatinine, Ser 1.05 (H) 0.44 - 1.00 mg/dL   Calcium 8.2 (L) 8.9 - 10.3 mg/dL   GFR calc non Af Amer 49 (L) >60 mL/min   GFR calc Af Amer 57 (L) >60 mL/min   Anion gap 11 5 - 15    Comment: Performed at [facility], [address] [place], [place] [address]  CBC     Status: Abnormal   Collection Time: 11/21/18 12:40 PM  Result Value Ref Range   WBC 7.9 4.0 - 10.5 K/uL   RBC 3.85 (L) 3.87 - 5.11 MIL/uL   Hemoglobin 11.6 (L) 12.0 - 15.0 g/dL   HCT 37.0 36.0 - 46.0 %   MCV 96.1 80.0 - 100.0 fL   MCH 30.1 26.0 - 34.0 pg   MCHC 31.4 30.0 - 36.0 g/dL   RDW 13.6 11.5 - 15.5 %   Platelets 269 150 - 400 K/uL   nRBC 0.0 0.0 - 0.2 %    Comment: Performed at [facility], [address] [place], [place] [address]  Troponin I - Once     Status: None   Collection Time: 11/21/18 12:40 PM  Result Value Ref Range   Troponin I <0.03 <0.03 ng/mL    Comment: Performed at [facility], [address] [place], [place] [address]  Troponin I - Now Then Q6H     Status: None   Collection Time: 11/21/18  4:00 PM  Result Value Ref Range   Troponin I <0.03 <0.03 ng/mL    Comment: Performed at [facility], [place], [name] [address]   Ct Angio Neck W Or Wo Contrast  Result Date: 11/21/2018 CLINICAL DATA:  Hypotension during a stress test. Near syncope. History of  thyroid cancer. EXAM: CT ANGIOGRAPHY NECK TECHNIQUE: Multidetector CT imaging of the neck was performed using the standard protocol during bolus administration of intravenous contrast. Multiplanar CT image reconstructions and MIPs were obtained to evaluate the vascular anatomy.  Carotid stenosis measurements (when applicable) are obtained utilizing NASCET criteria, using the distal internal carotid diameter as the denominator. CONTRAST:  49mL OMNIPAQUE IOHEXOL 350 MG/ML SOLN COMPARISON:  PET-CT 08/07/2018. Soft tissue neck CT 12/05/2013. FINDINGS: Aortic arch: Standard 3 vessel aortic arch with mild atherosclerotic plaque. Widely patent arch vessel origins. Soft plaque throughout the proximal left subclavian artery resulting in less than 50% stenosis. Right carotid system: Patent with mild plaque at the carotid bifurcation. No evidence of stenosis or dissection. Left carotid system: Patent with a focal, less than 50% stenosis of the mid common carotid artery. Predominantly calcified plaque at the carotid bifurcation not resulting in stenosis. No evidence of dissection. Vertebral arteries: Patent and codominant without evidence of flow limiting stenosis or dissection. Tortuous proximal left V1 segments with up to mild narrowing. Skeleton: Advanced disc and facet degeneration in the cervical and upper thoracic spine. Moderately advanced median C1-2 arthropathy. Other neck: Fatty atrophy of the submandibular and parotid glands. Status post thyroidectomy similar appearance of left lower neck lymph nodes compared to the prior PET-CT. 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 Upper chest: Mild biapical lung scarring. IMPRESSION: 1. Mild atherosclerosis without significant cervical carotid or vertebral artery stenosis or dissection. 2.  Aortic Atherosclerosis (ICD10-I70.0). Electronically Signed   By: Logan Bores M.D.   On: 11/21/2018 15:52   Dg Chest Port 1 View  Result Date:  11/21/2018 CLINICAL DATA:  Chest pain. EXAM: PORTABLE CHEST 1 VIEW COMPARISON:  11/03/2018. Chest CTA dated 11/03/2018 FINDINGS: Previously demonstrated nodule at the right lung base. Other previously demonstrated nodules are not visible. Minimal linear atelectasis at the left lung base. Clear right lung. Normal sized heart. Diffuse osteopenia IMPRESSION: 1. Minimal left basilar atelectasis. 2. Previously demonstrated right basilar nodule. Electronically Signed   By: Claudie Revering M.D.   On: 11/21/2018 13:25    Pending Labs Unresulted Labs (From admission, onward)    Start     Ordered   11/21/18 1513  Troponin I - Now Then Q6H  Now then every 6 hours,   STAT     11/21/18 1512   Signed and Held  Basic metabolic panel  Tomorrow morning,   R     Signed and Held   Signed and Held  CBC  Tomorrow morning,   R     Signed and Held   Signed and Held  CBC  (heparin)  Once,   R    Comments:  Baseline for heparin therapy IF NOT ALREADY DRAWN.  Notify MD if PLT < 100 K.    Signed and Held   Signed and Held  Creatinine, serum  (heparin)  Once,   R    Comments:  Baseline for heparin therapy IF NOT ALREADY DRAWN.    Signed and Held          Vitals/Pain Today's Vitals   11/21/18 1612 11/21/18 1630 11/21/18 1700 11/21/18 1730  BP:  (!) 146/76 (!) 157/79 (!) 161/81  Pulse:  63 (!) 59 (!) 59  Resp:  12 14 10   SpO2:  100% 99% (!) 79%  Weight:      Height:      PainSc: 0-No pain       Isolation Precautions No active isolations  Medications Medications  iohexol (OMNIPAQUE) 350 MG/ML injection 75 mL (75 mLs Intravenous Contrast Given 11/21/18 1521)    Mobility walks with device High fall risk   Focused Assessments Cardiac Assessment Handoff:  Cardiac Rhythm:  Sinus bradycardia Lab Results  Component Value Date   TROPONINI <0.03 11/21/2018   No results found for: DDIMER Does the Patient currently have chest pain? No     R Recommendations: See Admitting Provider Note  Report given  to:   Additional Notes:

## 2018-11-21 NOTE — ED Notes (Signed)
Pt assisted by this Rn to the bedside commode. Pt did well. Pt reports being hungry. Will place a diet order.

## 2018-11-21 NOTE — H&P (Signed)
[facility] [place] NAME: Laura Walters    MR#:  [id number]  DATE OF BIRTH:  05/21/1933  DATE OF ADMISSION:  11/21/2018  PRIMARY CARE PHYSICIAN: Idelle Crouch, MD   REQUESTING/REFERRING PHYSICIAN: Jimmye Norman  CHIEF COMPLAINT:   Chief Complaint  Patient presents with  . Hypotension    HISTORY OF PRESENT ILLNESS: Laura Walters  is a 85 y.o. female with a known history of thyroid cancer, cardiomyopathy, chest pain, constipation, degenerative disc disease, gastroesophageal reflux disease, hypertension, hypothyroidism, irritable bowel syndrome, reactive airway disease, spinal stenosis-had recurrent admissions with syncopal episodes.  Last admission was 3 days ago when she was found to have elevated troponin chest for 1 reading so cardiologist was called in but repeat troponin was negative and so she was discharged home without any further cardiac testing's.  She was advised to have carotid Doppler studies and cardiology follow-up as outpatient. Today she was in the hospital for carotid Doppler studies while the technician noted patient getting slightly drowsy and her heart rate was low up to 40's.  Her blood pressure was also lower side with these episodes and technician transferred her to emergency room. Patient denies any chest pain during this episode.  She denies any focal weakness but she had some headache for last few days. headache is intermittent.  PAST MEDICAL HISTORY:   Past Medical History:  Diagnosis Date  . Arthritis   . Asthma   . Back pain   . Cancer of thyroid ([place]) 03/30/2015  . Cardiomyopathy ([place])    idiopathic  . Chest pain    non cardiac  . Chokes    easily   swallow test negative  . Constipation   . DDD (degenerative disc disease), lumbar   . Disc disorder   . GERD (gastroesophageal reflux disease)   . H/O wheezing    advair as needed  . Hypertension   . Hypothyroidism   . IBS (irritable bowel syndrome)   . Neuropathy    . Nocturia   . RAD (reactive airway disease)   . Rectocele   . Seasonal allergies   . Spinal stenosis   . Urge incontinence   . Vaginal atrophy   . Vaginal polyp     PAST SURGICAL HISTORY:  Past Surgical History:  Procedure Laterality Date  . BREAST BIOPSY Right 86 and 92   2 bx-neg  . CATARACT EXTRACTION W/PHACO Right 05/19/2016   Procedure: CATARACT EXTRACTION PHACO AND INTRAOCULAR LENS PLACEMENT (IOC);  Surgeon: Estill Cotta, MD;  Location: [facility] ORS;  Service: Ophthalmology;  Laterality: Right;  Lot# [id number] [place]:   1:25.3 AP%:  24.9% CDE:  40.11  . CATARACT EXTRACTION W/PHACO Left 08/11/2016   Procedure: CATARACT EXTRACTION PHACO AND INTRAOCULAR LENS PLACEMENT (IOC);  Surgeon: Estill Cotta, MD;  Location: [facility] OR[facility];  Service: Ophthalmology;  Laterality: Left;  [place] 1.24 AP% 26.1 CDE 37.79 FLUID PACK LOT # [id number]  . DILATION AND CURETTAGE OF UTERUS    . HEMORROIDECTOMY     x 2  . JOINT REPLACEMENT     left hip  . KNEE ARTHROPLASTY Right 01/05/2016   Procedure: COMPUTER ASSISTED TOTAL KNEE ARTHROPLASTY;  Surgeon: Dereck Leep, MD;  Location: [facility] OR[facility];  Service: Orthopedics;  Laterality: Right;  . KNEE ARTHROSCOPY Right 05/19/2015   Procedure: Right knee arthrosocpy medail and lateral menisectomy, chondroplasty ;  Surgeon: Dereck Leep, MD;  Location: [facility] OR[facility];  Service: Orthopedics;  Laterality: Right;  . Left total hip  arthroplasty    . PAROTIDECTOMY    . PITUITARY SURGERY    . THYROID SURGERY     bx  . THYROIDECTOMY      SOCIAL HISTORY:  Social History   Tobacco Use  . Smoking status: Never Smoker  . Smokeless tobacco: Never Used  Substance Use Topics  . Alcohol use: No    FAMILY HISTORY:  Family History  Problem Relation Age of Onset  . Heart disease Father   . Heart attack Father   . Diabetes Paternal Uncle   . Cancer Neg Hx   . Breast cancer Neg Hx     DRUG ALLERGIES:  Allergies  Allergen Reactions  . Acetaminophen-Codeine Other (See  Comments)  . Aleve [Naproxen] Hives    Causes dizziness  . Penicillins Hives    Has patient had a PCN reaction causing immediate rash, facial/tongue/throat swelling, SOB or lightheadedness with hypotension: hives Has patient had a PCN reaction causing severe rash involving mucus membranes or skin necrosis: no Has patient had a PCN reaction that required hospitalization no Has patient had a PCN reaction occurring within the last 10 years: no If all of the above answers are "NO", then may proceed with Cephalosporin use.  . Sulfa Antibiotics Hives  . Codeine Hives, Itching and Other (See Comments)    dizziness    REVIEW OF SYSTEMS:   CONSTITUTIONAL: No fever, fatigue or weakness.  EYES: No blurred or double vision.  EARS, NOSE, AND THROAT: No tinnitus or ear pain.  RESPIRATORY: No cough, shortness of breath, wheezing or hemoptysis.  CARDIOVASCULAR: No chest pain, orthopnea, edema.  GASTROINTESTINAL: No nausea, vomiting, diarrhea or abdominal pain.  GENITOURINARY: No dysuria, hematuria.  ENDOCRINE: No polyuria, nocturia,  HEMATOLOGY: No anemia, easy bruising or bleeding SKIN: No rash or lesion. MUSCULOSKELETAL: No joint pain or arthritis.  Dizziness. NEUROLOGIC: No tingling, numbness, weakness.  PSYCHIATRY: No anxiety or depression.   MEDICATIONS AT HOME:  Prior to Admission medications   Medication Sig Start Date End Date Taking? Authorizing Provider  acetaminophen (TYLENOL) 325 MG tablet Take 650 mg by mouth daily.   Yes [provider]  diphenhydrAMINE (BENADRYL) 25 MG tablet Take 25 mg by mouth every 6 (six) hours as needed.   Yes [provider]  docusate sodium (COLACE) 100 MG capsule Take 100-200 mg by mouth 2 (two) times daily.    Yes [provider]  DULoxetine (CYMBALTA) 60 MG capsule Take 60 mg by mouth daily.  02/02/16  Yes [provider]  ferrous sulfate 325 (65 FE) MG tablet Take 325 mg by mouth daily with breakfast.    Yes [provider]  gabapentin (NEURONTIN) 400 MG capsule Take 800 mg by mouth 2 (two) times daily.  02/09/16  Yes [provider]  levothyroxine (SYNTHROID, LEVOTHROID) 100 MCG tablet Take 100 mcg by mouth daily.  10/04/17  Yes [provider]  levothyroxine (SYNTHROID, LEVOTHROID) 50 MCG tablet Take 50 mcg by mouth once a week. Take in the morning of (Wednesday) day 7.   Yes [provider]  MAGNESIUM PO Take 250 mg by mouth daily.    Yes [provider]  Multiple Vitamins-Minerals (PRESERVISION AREDS 2) CAPS Take 1 capsule by mouth 2 (two) times daily.   Yes [provider]  omeprazole (PRILOSEC) 40 MG capsule Take 40 mg by mouth daily.  02/16/16  Yes [provider]  potassium chloride (K-DUR,KLOR-CON) 10 MEQ tablet Take 10 mEq by mouth daily.    Yes  [provider]  traMADol (ULTRAM) 50 MG tablet Take 50 mg by mouth every 6 (six) hours as needed.   Yes [provider]      PHYSICAL EXAMINATION:   VITAL SIGNS: Blood pressure (!) 114/57, pulse 70, temperature 98.3 F (36.8 C), temperature source Oral, resp. rate 18, height 5' (1.524 m), weight 52.9 kg, SpO2 99 %.  GENERAL:  85 y.o.-year-old patient lying in the bed with no acute distress.  EYES: Pupils equal, round, reactive to light and accommodation. No scleral icterus. Extraocular muscles intact.  HEENT: Head atraumatic, normocephalic. Oropharynx and nasopharynx clear.  NECK:  Supple, no jugular venous distention. No thyroid enlargement, no tenderness.  LUNGS: Normal breath sounds bilaterally, no wheezing, rales,rhonchi or crepitation. No use of accessory muscles of respiration.  CARDIOVASCULAR: S1, S2 normal. No murmurs, rubs, or gallops.  ABDOMEN: Soft, nontender, nondistended. Bowel sounds present. No organomegaly or mass.  EXTREMITIES: No pedal edema, cyanosis, or clubbing.  NEUROLOGIC: Cranial nerves II through XII are intact. Muscle strength 5/5 in all extremities.  Sensation intact. Gait not checked.  PSYCHIATRIC: The patient is alert and oriented x 3.  SKIN: No obvious rash, lesion, or ulcer.   LABORATORY PANEL:   CBC Recent Labs  Lab 11/18/18 1845 11/21/18 1240  WBC 8.1 7.9  HGB 11.7* 11.6*  HCT 37.0 37.0  PLT 227 269  MCV 93.7 96.1  MCH 29.6 30.1  MCHC 31.6 31.4  RDW 13.3 13.6   ------------------------------------------------------------------------------------------------------------------  Chemistries  Recent Labs  Lab 11/18/18 1744 11/21/18 1240  NA 134* 136  K 4.8 4.8  CL 104 104  CO2 23 21*  GLUCOSE 106* 105*  BUN 17 16  CREATININE 0.85 1.05*  CALCIUM 7.8* 8.2*  MG 2.5*  --    ------------------------------------------------------------------------------------------------------------------ estimated creatinine clearance is 29.2 mL/min (A) (by C-G formula based on SCr of 1.05 mg/dL (H)). ------------------------------------------------------------------------------------------------------------------ No results for input(s): TSH, T4TOTAL, T3FREE, THYROIDAB in the last 72 hours.  Invalid input(s): FREET3   Coagulation profile Recent Labs  Lab 11/18/18 2147  INR 0.97   ------------------------------------------------------------------------------------------------------------------- No results for input(s): DDIMER in the last 72 hours. -------------------------------------------------------------------------------------------------------------------  Cardiac Enzymes Recent Labs  Lab 11/21/18 1240 11/21/18 1600 11/21/18 2155  TROPONINI <0.03 <0.03 <0.03   ------------------------------------------------------------------------------------------------------------------ Invalid input(s): POCBNP  ---------------------------------------------------------------------------------------------------------------  Urinalysis    Component Value Date/Time   COLORURINE YELLOW (A) 11/18/2018 1744   APPEARANCEUR  CLEAR (A) 11/18/2018 1744   APPEARANCEUR Clear 01/08/2015 1357   LABSPEC 1.011 11/18/2018 1744   LABSPEC 1.008 01/08/2015 1357   PHURINE 7.0 11/18/2018 1744   GLUCOSEU NEGATIVE 11/18/2018 1744   GLUCOSEU Negative 01/08/2015 1357   HGBUR NEGATIVE 11/18/2018 1744   BILIRUBINUR NEGATIVE 11/18/2018 1744   BILIRUBINUR neg 04/06/2018 1040   BILIRUBINUR Negative 01/08/2015 [place] 11/18/2018 1744   PROTEINUR NEGATIVE 11/18/2018 1744   UROBILINOGEN 0.2 04/06/2018 1040   NITRITE NEGATIVE 11/18/2018 1744   LEUKOCYTESUR TRACE (A) 11/18/2018 1744   LEUKOCYTESUR Negative 01/08/2015 1357     RADIOLOGY: Ct Angio Neck W Or Wo Contrast  Result Date: 11/21/2018 CLINICAL DATA:  Hypotension during a stress test. Near syncope. History of thyroid cancer. EXAM: CT ANGIOGRAPHY NECK TECHNIQUE: Multidetector CT imaging of the neck was performed using the standard protocol during bolus administration of intravenous contrast. Multiplanar CT image reconstructions and MIPs were obtained to evaluate the vascular anatomy. Carotid stenosis measurements (when applicable) are obtained utilizing NASCET criteria, using the distal internal carotid diameter as the denominator. CONTRAST:  10mL OMNIPAQUE IOHEXOL 350 MG/ML SOLN COMPARISON:  PET-CT 08/07/2018. Soft tissue neck CT 12/05/2013. FINDINGS: Aortic arch: Standard 3 vessel aortic arch with mild atherosclerotic plaque. Widely patent arch vessel origins. Soft plaque throughout the proximal left subclavian artery resulting in less than 50% stenosis. Right carotid system: Patent with mild plaque at the carotid bifurcation. No evidence of stenosis or dissection. Left carotid system: Patent with a focal, less than 50% stenosis of the mid common carotid artery. Predominantly calcified plaque at the carotid bifurcation not resulting in stenosis. No evidence of dissection. Vertebral arteries: Patent and codominant without evidence of flow limiting stenosis or  dissection. Tortuous proximal left V1 segments with up to mild narrowing. Skeleton: Advanced disc and facet degeneration in the cervical and upper thoracic spine. Moderately advanced median C1-2 arthropathy. Other neck: Fatty atrophy of the submandibular and parotid glands. Status post thyroidectomy similar appearance of left lower neck lymph nodes compared to the prior PET-CT. 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 Upper chest: Mild biapical lung scarring. IMPRESSION: 1. Mild atherosclerosis without significant cervical carotid or vertebral artery stenosis or dissection. 2.  Aortic Atherosclerosis (ICD10-I70.0). Electronically Signed   By: Logan Bores M.D.   On: 11/21/2018 15:52   Mr Brain Wo Contrast  Result Date: 11/21/2018 CLINICAL DATA:  Acute headache EXAM: MRI HEAD WITHOUT CONTRAST TECHNIQUE: Multiplanar, multiecho pulse sequences of the brain and surrounding structures were obtained without intravenous contrast. COMPARISON:  CTA neck 11/21/2018 Brain MRI 01/12/2017 FINDINGS: BRAIN: There is no acute infarct, acute hemorrhage, hydrocephalus or extra-axial collection. 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 Extension into the clivus is unchanged. The mass measures approximately 3.0 x 3.8 by 2.8 cm. There is magnetic susceptibility effect within the mass. Early confluent hyperintense T2-weighted signal of the periventricular and deep white matter, most commonly due to chronic ischemic microangiopathy. Generalized atrophy without lobar predilection. Susceptibility-sensitive sequences show no chronic microhemorrhage or superficial siderosis. VASCULAR: Major intracranial arterial and venous sinus flow voids are normal. SKULL AND UPPER CERVICAL SPINE: Calvarial bone marrow signal is normal. There is no skull base mass. Visualized upper cervical spine and soft tissues are normal. SINUSES/ORBITS: Moderate  paranasal sinus mucosal thickening with fluid levels in both maxillary sinuses. The orbits are normal. IMPRESSION: 1. No acute intracranial abnormality. 2. Chronic ischemic microangiopathy. 3. 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 4. Moderate paranasal sinus mucosal thickening with bilateral maxillary sinus fluid levels. Correlate for symptoms of acute sinusitis. This could account for the patient's headache. 5. Assess stone for small parenchymal metastases limited without IV contrast. Electronically Signed   By: Ulyses Jarred M.D.   On: 11/21/2018 21:48   Dg Chest Port 1 View  Result Date: 11/21/2018 CLINICAL DATA:  Chest pain. EXAM: PORTABLE CHEST 1 VIEW COMPARISON:  11/03/2018. Chest CTA dated 11/03/2018 FINDINGS: Previously demonstrated nodule at the right lung base. Other previously demonstrated nodules are not visible. Minimal linear atelectasis at the left lung base. Clear right lung. Normal sized heart. Diffuse osteopenia IMPRESSION: 1. Minimal left basilar atelectasis. 2. Previously demonstrated right basilar nodule. Electronically Signed   By: Claudie Revering M.D.   On: 11/21/2018 13:25    EKG: Orders placed or performed during the hospital encounter of 11/21/18  . EKG 12-Lead  . EKG 12-Lead  . EKG 12-Lead  . EKG 12-Lead  . ED  EKG  . ED EKG    IMPRESSION AND PLAN:  *Dizziness, symptomatic bradycardia. This could be due to manipulation of the carotid arteries during carotid Doppler study today. Check TSH, continue to monitor on telemetry. Cardiologist is contacted for further management. As this is recurrent complaint now, I will also order CT scan of the neck angiogram. CT scan of the head was done recently which has been reported to have some small pituitary mass.  I will get an MRI done and further decision about consults is pending waiting on results of  MRI.  *Hypothyroidism Continue Synthroid and check TSH. *Neuropathy Continue gabapentin.  *Lung nodules This is already known to the patient because of old cancer. Advised to follow with cancer center during last admission.  We may need to make sure she has follow-up appointments.  All the records are reviewed and case discussed with ED provider. Management plans discussed with the patient, family and they are in agreement.  CODE STATUS: Full code.    Code Status Orders  (From admission, onward)         Start     Ordered   11/21/18 1906  Full code  Continuous     11/21/18 1905        Code Status History    Date Active Date Inactive Code Status Order ID Comments User Context   11/03/2018 1849 11/04/2018 1804 DNR [id number]  [name], MD Inpatient   05/19/2015 1612 05/19/2015 2013 Full Code [id number]  Dereck Leep, MD Inpatient    Advance Directive Documentation     Most Recent Value  Type of Advance Directive  Living will  Pre-existing out of facility DNR order (yellow form or pink MOST form)  -  "MOST" Form in Place?  -     Patient's son was present in the room during my visit.  TOTAL TIME TAKING CARE OF THIS PATIENT: 45 minutes.    [name] M.D on 11/21/2018   Between 7am to 6pm - Pager - [phone]  After 6pm go to [email or web address] - password [email or web address] [facility]  Office  [phone]  CC: Primary care physician; Idelle Crouch, MD   Note: This dictation was prepared with Dragon dictation along with smaller phrase technology. Any transcriptional errors that result from this process are unintentional.

## 2018-11-22 LAB — BASIC METABOLIC PANEL
Anion gap: 8 (ref 5–15)
BUN: 17 mg/dL (ref 8–23)
CO2: 23 mmol/L (ref 22–32)
Calcium: 8.1 mg/dL — ABNORMAL LOW (ref 8.9–10.3)
Chloride: 105 mmol/L (ref 98–111)
Creatinine, Ser: 0.82 mg/dL (ref 0.44–1.00)
GFR calc non Af Amer: >60 mL/min (ref >60)
Glucose, Bld: 90 mg/dL (ref 70–99)
Potassium: 4.2 mmol/L (ref 3.5–5.1)
Sodium: 136 mmol/L (ref 135–145)

## 2018-11-22 LAB — CBC
HCT: 36.2 % (ref 36.0–46.0)
Hemoglobin: 11.7 g/dL — ABNORMAL LOW (ref 12.0–15.0)
MCH: 29.8 pg (ref 26.0–34.0)
MCHC: 32.3 g/dL (ref 30.0–36.0)
MCV: 92.3 fL (ref 80.0–100.0)
NRBC: 0 % (ref 0.0–0.2)
Platelets: 248 10*3/uL (ref 150–400)
RBC: 3.92 MIL/uL (ref 3.87–5.11)
RDW: 13.5 % (ref 11.5–15.5)
WBC: 6.8 10*3/uL (ref 4.0–10.5)

## 2018-11-22 LAB — TROPONIN I: Troponin I: <0.03 ng/mL (ref <0.03)

## 2018-11-22 LAB — CORTISOL: Cortisol, Plasma: 10.7 ug/dL

## 2018-11-22 MED ORDER — GABAPENTIN 300 MG PO CAPS: 600.0000 mg | ORAL_CAPSULE | Freq: Two times a day (BID) | ORAL | Status: DC

## 2018-11-22 NOTE — Progress Notes (Signed)
[facility] Cardiology [facility] Encounter Note  Patient: Laura Walters / Admit Date: 11/21/2018 / Date of Encounter: 11/22/2018, 8:52 AM   Subjective: Patient felt very well overnight.  No evidence of rhythm disturbances by telemetry.  No evidence of bradycardia or syncope or other symptoms.  Patient has ambulated well. Carotid angiogram shows no evidence of critical atherosclerosis or stenosis or dissection.  Echocardiogram showing normal LV systolic function.  No evidence of anginal symptoms.  Therefore possibility of rhythm disturbances including heart block and or carotid sinus syndrome as a cause of syncope  Review of Systems: Positive for: None Negative for: Vision change, hearing change, syncope, dizziness, nausea, vomiting,diarrhea, bloody stool, stomach pain, cough, congestion, diaphoresis, urinary frequency, urinary pain,skin lesions, skin rashes Others previously listed  Objective: Telemetry: Normal sinus rhythm Physical Exam: Blood pressure 130/78, pulse 62, temperature 98.3 F (36.8 C), temperature source Oral, resp. rate 18, height 5' (1.524 m), weight 53.1 kg, SpO2 96 %. Body mass index is 22.85 kg/m. General: Well developed, well nourished, in no acute distress. Head: Normocephalic, atraumatic, sclera non-icteric, no xanthomas, nares are without discharge. Neck: No apparent masses Lungs: Normal respirations with no wheezes, no rhonchi, no rales , no crackles   Heart: Regular rate and rhythm, normal S1 S2, no murmur, no rub, no gallop, PMI is normal size and placement, carotid upstroke normal without bruit, jugular venous pressure normal Abdomen: Soft, non-tender, non-distended with normoactive bowel sounds. No hepatosplenomegaly. Abdominal aorta is normal size without bruit Extremities: No edema, no clubbing, no cyanosis, no ulcers,  Peripheral: 2+ radial, 2+ femoral, 2+ dorsal pedal pulses Neuro: Alert and oriented. Moves all extremities spontaneously. Psych:  Responds  to questions appropriately with a normal affect.   Intake/Output Summary (Last 24 hours) at 11/22/2018 0852 Last data filed at 11/22/2018 0656 Gross per 24 hour  Intake -  Output 900 ml  Net -900 ml    Inpatient Medications:  . acetaminophen  650 mg Oral Daily  . docusate sodium  100 mg Oral BID  . DULoxetine  60 mg Oral Daily  . ferrous sulfate  325 mg Oral Q breakfast  . gabapentin  800 mg Oral BID  . heparin  5,000 Units Subcutaneous Q8H  . levothyroxine  100 mcg Oral Q0600  . loratadine  10 mg Oral Daily  . multivitamin-lutein  1 capsule Oral BID  . pantoprazole  40 mg Oral Daily  . potassium chloride  10 mEq Oral Daily   Infusions:   Labs: Recent Labs    11/21/18 1240 11/22/18 0249  NA 136 136  K 4.8 4.2  CL 104 105  CO2 21* 23  GLUCOSE 105* 90  BUN 16 17  CREATININE 1.05* 0.82  CALCIUM 8.2* 8.1*   No results for input(s): AST, ALT, ALKPHOS, BILITOT, PROT, ALBUMIN in the last 72 hours. Recent Labs    11/21/18 1240 11/22/18 0249  WBC 7.9 6.8  HGB 11.6* 11.7*  HCT 37.0 36.2  MCV 96.1 92.3  PLT 269 248   Recent Labs    11/21/18 1240 11/21/18 1600 11/21/18 2155 11/22/18 0249  TROPONINI <0.03 <0.03 <0.03 <0.03   Invalid input(s): POCBNP No results for input(s): HGBA1C in the last 72 hours.   Weights: Filed Weights   11/21/18 1242 11/21/18 2056 11/22/18 0500  Weight: 52.9 kg 52.9 kg 53.1 kg     Radiology/Studies:  Dg Chest 2 View  Result Date: 11/03/2018 CLINICAL DATA:  Dyspnea.  Hypoxia.  Syncope this morning. EXAM: CHEST - 2  VIEW COMPARISON:  Chest x-rays dated 12/28/2017 and 01/03/2015, PET-CT dated 01/17/2018 and chest CT dated 07/04/2017 FINDINGS: There are numerous bilateral pulmonary nodules which appears essentially unchanged since the PET-CT scan dated 08/07/2018. The patient has numerous pulmonary nodules visible on the PET-CT there are not visible on chest x-ray. Heart size and vascularity are normal. Aortic atherosclerosis. No  infiltrates or effusions. Fairly severe thoracolumbar scoliosis with convexity to the right. Small hiatal hernia. No acute bone abnormality. IMPRESSION: No acute abnormality. Numerous stable bilateral pulmonary nodules consistent with metastatic disease. Aortic Atherosclerosis (ICD10-I70.0). Electronically Signed   By: Lorriane Shire M.D.   On: 11/03/2018 14:01   Ct Head Wo Contrast  Result Date: 11/04/2018 CLINICAL DATA:  Syncope, simple, with normal neuro exam EXAM: CT HEAD WITHOUT CONTRAST TECHNIQUE: Contiguous axial images were obtained from the base of the skull through the vertex without intravenous contrast. COMPARISON:  Head CT 08/07/2018 FINDINGS: Brain: No acute infarct, hemorrhage, hydrocephalus, or collection. Age normal brain volume. Isodense mass in the left sella, left cavernous sinus region, left suprasellar cistern, and left Meckel's cave region. This causes mass effect on the inferior left cerebrum. Patient has history of pituitary tumor. Mild chronic small vessel ischemia in the cerebral white matter. Vascular: No hyperdense vessel or unexpected calcification. Skull: No acute or aggressive finding. Sinuses/Orbits: Changes of trans-sphenoidal surgery. Partial coverage of the sinuses shows extensive ethmoid and maxillary opacification with sclerotic wall thickening from chronic inflammation. IMPRESSION: 1. No acute finding. 2. Grossly similar appearance of sellar, suprasellar, and left cavernous sinus region mass when compared to a 2018 brain MRI. Patient has history of pituitary tumor. Electronically Signed   By: Monte Fantasia M.D.   On: 11/04/2018 11:15   Ct Angio Neck W Or Wo Contrast  Result Date: 11/21/2018 CLINICAL DATA:  Hypotension during a stress test. Near syncope. History of thyroid cancer. EXAM: CT ANGIOGRAPHY NECK TECHNIQUE: Multidetector CT imaging of the neck was performed using the standard protocol during bolus administration of intravenous contrast. Multiplanar CT image  reconstructions and MIPs were obtained to evaluate the vascular anatomy. Carotid stenosis measurements (when applicable) are obtained utilizing NASCET criteria, using the distal internal carotid diameter as the denominator. CONTRAST:  100mL OMNIPAQUE IOHEXOL 350 MG/ML SOLN COMPARISON:  PET-CT 08/07/2018. Soft tissue neck CT 12/05/2013. FINDINGS: Aortic arch: Standard 3 vessel aortic arch with mild atherosclerotic plaque. Widely patent arch vessel origins. Soft plaque throughout the proximal left subclavian artery resulting in less than 50% stenosis. Right carotid system: Patent with mild plaque at the carotid bifurcation. No evidence of stenosis or dissection. Left carotid system: Patent with a focal, less than 50% stenosis of the mid common carotid artery. Predominantly calcified plaque at the carotid bifurcation not resulting in stenosis. No evidence of dissection. Vertebral arteries: Patent and codominant without evidence of flow limiting stenosis or dissection. Tortuous proximal left V1 segments with up to mild narrowing. Skeleton: Advanced disc and facet degeneration in the cervical and upper thoracic spine. Moderately advanced median C1-2 arthropathy. Other neck: Fatty atrophy of the submandibular and parotid glands. Status post thyroidectomy similar appearance of left lower neck lymph nodes compared to the prior PET-CT. 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 Upper chest: Mild biapical lung scarring. IMPRESSION: 1. Mild atherosclerosis without significant cervical carotid or vertebral artery stenosis or dissection. 2.  Aortic Atherosclerosis (ICD10-I70.0). Electronically Signed   By: Logan Bores M.D.   On: 11/21/2018 15:52   Ct Angio Chest Pe W And/or  Wo Contrast  Result Date: 11/03/2018 CLINICAL DATA:  Near syncope today. Feeling lightheaded. History of thyroid cancer with lung metastasis. EXAM: CT ANGIOGRAPHY CHEST WITH CONTRAST TECHNIQUE: Multidetector CT imaging  of the chest was performed using the standard protocol during bolus administration of intravenous contrast. Multiplanar CT image reconstructions and MIPs were obtained to evaluate the vascular anatomy. CONTRAST:  54mL OMNIPAQUE IOHEXOL 350 MG/ML SOLN COMPARISON:  July 04, 2017 FINDINGS: Cardiovascular: Satisfactory opacification of the pulmonary arteries to the segmental level. No evidence of pulmonary embolism. Normal heart size. No pericardial effusion. Mediastinum/Nodes: No enlarged mediastinal, hilar, or axillary lymph nodes. Thyroid gland, trachea, and esophagus demonstrate no significant findings. Lungs/Pleura: There are multiple bilateral pulmonary nodules consistent with patient's known thyroid metastasis to the lungs. Which are viewed nodules are unchanged compared prior exam. However, the largest nodule in the left lower lobe is increased in size compared to prior exam of 2018 currently measuring 1.6 x 1.7 cm. There is no pleural effusion. Upper Abdomen: There is a cyst in the dome of the liver unchanged compared to prior exam. Moderate hiatal hernia is identified. Musculoskeletal: No definite focal discrete lytic or blastic lesions are identified. Degenerative joint changes of the spine. Review of the MIP images confirms the above findings. IMPRESSION: No pulmonary embolus. Multiple pulmonary nodules in both lungs consistent with known metastasis. Majority of the nodules are not significantly changed compared to prior exam. However, the largest nodule in the left lower lobe is increased in size currently measuring 1.6 x 1.7 cm. Electronically Signed   By: Abelardo Diesel M.D.   On: 11/03/2018 15:05   Mr Brain Wo Contrast  Result Date: 11/21/2018 CLINICAL DATA:  Acute headache EXAM: MRI HEAD WITHOUT CONTRAST TECHNIQUE: Multiplanar, multiecho pulse sequences of the brain and surrounding structures were obtained without intravenous contrast. COMPARISON:  CTA neck 11/21/2018 Brain MRI 01/12/2017 FINDINGS:  BRAIN: There is no acute infarct, acute hemorrhage, hydrocephalus or extra-axial collection. 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 Extension into the clivus is unchanged. The mass measures approximately 3.0 x 3.8 by 2.8 cm. There is magnetic susceptibility effect within the mass. Early confluent hyperintense T2-weighted signal of the periventricular and deep white matter, most commonly due to chronic ischemic microangiopathy. Generalized atrophy without lobar predilection. Susceptibility-sensitive sequences show no chronic microhemorrhage or superficial siderosis. VASCULAR: Major intracranial arterial and venous sinus flow voids are normal. SKULL AND UPPER CERVICAL SPINE: Calvarial bone marrow signal is normal. There is no skull base mass. Visualized upper cervical spine and soft tissues are normal. SINUSES/ORBITS: Moderate paranasal sinus mucosal thickening with fluid levels in both maxillary sinuses. The orbits are normal. IMPRESSION: 1. No acute intracranial abnormality. 2. Chronic ischemic microangiopathy. 3. 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 4. Moderate paranasal sinus mucosal thickening with bilateral maxillary sinus fluid levels. Correlate for symptoms of acute sinusitis. This could account for the patient's headache. 5. Assess stone for small parenchymal metastases limited without IV contrast. Electronically Signed   By: Ulyses Jarred M.D.   On: 11/21/2018 21:48   Dg Chest Port 1 View  Result Date: 11/21/2018 CLINICAL DATA:  Chest pain. EXAM: PORTABLE CHEST 1 VIEW COMPARISON:  11/03/2018. Chest CTA dated 11/03/2018 FINDINGS: Previously demonstrated nodule at the right lung base. Other previously demonstrated nodules are not visible. Minimal linear atelectasis at the left lung base. Clear right lung. Normal sized  heart. Diffuse  osteopenia IMPRESSION: 1. Minimal left basilar atelectasis. 2. Previously demonstrated right basilar nodule. Electronically Signed   By: Claudie Revering M.D.   On: 11/21/2018 13:25     Assessment and Recommendation  85 y.o. female with chronic kidney disease stage III coronary and carotid atherosclerosis with recurrent syncope of unknown etiology but no current evidence of coronary artery atherosclerosis angina or peripheral vascular disease significant enough to cause above.  Patient may have carotid sinus syndrome but not inducible by carotid massage or manipulation at this time.  Patient also may have possibility of rhythm disturbances although not caught as the primary cause 1.  Continue telemetry with ambulation today 2.  If no further rhythm disturbances or other cause syncope would consider 30-day monitor for further evaluation and treatment 3.  If ambulating well okay for discharge home with 30-day monitor from the cardiovascular standpoint  Signed, Serafina Royals M.D. FACC

## 2018-11-22 NOTE — Discharge Summary (Addendum)
[facility] NAME: Laura Walters    MR#:  [id number]  DATE OF BIRTH:  [birth year]  DATE OF ADMISSION:  11/21/2018 ADMITTING PHYSICIAN: [name], MD  DATE OF DISCHARGE: 11/22/2018  PRIMARY CARE PHYSICIAN: Idelle Crouch, MD   ADMISSION DIAGNOSIS:  Syncope and collapse [R55]  DISCHARGE DIAGNOSIS:  Active Problems:   Bradycardia Syncope Dizziness SECONDARY DIAGNOSIS:   Past Medical History:  Diagnosis Date  . Arthritis   . Asthma   . Back pain   . Cancer of thyroid ([place]) 03/30/2015  . Cardiomyopathy ([place])    idiopathic  . Chest pain    non cardiac  . Chokes    easily   swallow test negative  . Constipation   . DDD (degenerative disc disease), lumbar   . Disc disorder   . GERD (gastroesophageal reflux disease)   . H/O wheezing    advair as needed  . Hypertension   . Hypothyroidism   . IBS (irritable bowel syndrome)   . Neuropathy   . Nocturia   . RAD (reactive airway disease)   . Rectocele   . Seasonal allergies   . Spinal stenosis   . Urge incontinence   . Vaginal atrophy   . Vaginal polyp      ADMITTING HISTORY  Laura Walters  is a 85 y.o. female with a known history of thyroid cancer, cardiomyopathy, chest pain, constipation, degenerative disc disease, gastroesophageal reflux disease, hypertension, hypothyroidism, irritable bowel syndrome, reactive airway disease, spinal stenosis-had recurrent admissions with syncopal episodes.  Last admission was 3 days ago when she was found to have elevated troponin chest for 1 reading so cardiologist was called in but repeat troponin was negative and so she was discharged home without any further cardiac testing's.  She was advised to have carotid Doppler studies and cardiology follow-up as outpatient.Today she was in the hospital for carotid Doppler studies while the technician noted patient getting slightly drowsy and her heart rate was low up to 40's.  Her blood  pressure was also lower side with these episodes and technician transferred her to emergency room.Patient denies any chest pain during this episode.  She denies any focal weakness but she had some headache for last few days. headache is intermittent  HOSPITAL COURSE:  Patient was admitted to telemetry.  Patient's dizziness and symptomatic bradycardia could be secondary to manipulation of the carotid arteries during carotid Doppler study possibly.  Her dizziness improved.  Cortisol level was checked which was normal.  She was worked up with CTA head and neck which was a normal study.  MRI brain also did not reveal any acute abnormality except pituitary tumor which is chronic.  Cardiology evaluated the patient during hospitalization.  Did not recommend any acute intervention.  They recommended 30-day event monitor which will be arranged by cardiology office.  CONSULTS OBTAINED:  Treatment Team:  Corey Skains, MD  DRUG ALLERGIES:   Allergies  Allergen Reactions  . Acetaminophen-Codeine Other (See Comments)  . Aleve [Naproxen] Hives    Causes dizziness  . Penicillins Hives    Has patient had a PCN reaction causing immediate rash, facial/tongue/throat swelling, SOB or lightheadedness with hypotension: hives Has patient had a PCN reaction causing severe rash involving mucus membranes or skin necrosis: no Has patient had a PCN reaction that required hospitalization no Has patient had a PCN reaction occurring within the last 10 years: no If all of the above answers are "NO", then  may proceed with Cephalosporin use.  . Sulfa Antibiotics Hives  . Codeine Hives, Itching and Other (See Comments)    dizziness    DISCHARGE MEDICATIONS:   Allergies as of 11/22/2018      Reactions   Acetaminophen-codeine Other (See Comments)   Aleve [naproxen] Hives   Causes dizziness   Penicillins Hives   Has patient had a PCN reaction causing immediate rash, facial/tongue/throat swelling, SOB or  lightheadedness with hypotension: hives Has patient had a PCN reaction causing severe rash involving mucus membranes or skin necrosis: no Has patient had a PCN reaction that required hospitalization no Has patient had a PCN reaction occurring within the last 10 years: no If all of the above answers are "NO", then may proceed with Cephalosporin use.   Sulfa Antibiotics Hives   Codeine Hives, Itching, Other (See Comments)   dizziness      Medication List    TAKE these medications   acetaminophen 325 MG tablet Commonly known as:  TYLENOL Take 650 mg by mouth daily.   diphenhydrAMINE 25 MG tablet Commonly known as:  BENADRYL Take 25 mg by mouth every 6 (six) hours as needed.   docusate sodium 100 MG capsule Commonly known as:  COLACE Take 100-200 mg by mouth 2 (two) times daily.   DULoxetine 60 MG capsule Commonly known as:  CYMBALTA Take 60 mg by mouth daily.   ferrous sulfate 325 (65 FE) MG tablet Take 325 mg by mouth daily with breakfast.   gabapentin 400 MG capsule Commonly known as:  NEURONTIN Take 800 mg by mouth 2 (two) times daily.   levothyroxine 100 MCG tablet Commonly known as:  SYNTHROID, LEVOTHROID Take 100 mcg by mouth daily. What changed:  Another medication with the same name was removed. Continue taking this medication, and follow the directions you see here.   MAGNESIUM PO Take 250 mg by mouth daily.   omeprazole 40 MG capsule Commonly known as:  PRILOSEC Take 40 mg by mouth daily.   potassium chloride 10 MEQ tablet Commonly known as:  K-DUR,KLOR-CON Take 10 mEq by mouth daily.   PRESERVISION AREDS 2 Caps Take 1 capsule by mouth 2 (two) times daily.   traMADol 50 MG tablet Commonly known as:  ULTRAM Take 50 mg by mouth every 6 (six) hours as needed.       Today  Patient seen today No complaints of dizziness No palpitations Hemodynamically stable  VITAL SIGNS:  Blood pressure (!) 151/72, pulse 66, temperature 99.4 F (37.4 C),  temperature source Oral, resp. rate 16, height 5' (1.524 m), weight 53.1 kg, SpO2 97 %.  I/O:    Intake/Output Summary (Last 24 hours) at 11/22/2018 1454 Last data filed at 11/22/2018 0656 Gross per 24 hour  Intake -  Output 900 ml  Net -900 ml    PHYSICAL EXAMINATION:  Physical Exam  GENERAL:  85 y.o.-year-old patient lying in the bed with no acute distress.  LUNGS: Normal breath sounds bilaterally, no wheezing, rales,rhonchi or crepitation. No use of accessory muscles of respiration.  CARDIOVASCULAR: S1, S2 normal. No murmurs, rubs, or gallops.  ABDOMEN: Soft, non-tender, non-distended. Bowel sounds present. No organomegaly or mass.  NEUROLOGIC: Moves all 4 extremities. PSYCHIATRIC: The patient is alert and oriented x 3.  SKIN: No obvious rash, lesion, or ulcer.   DATA REVIEW:   CBC Recent Labs  Lab 11/22/18 0249  WBC 6.8  HGB 11.7*  HCT 36.2  PLT 248    Chemistries  Recent Labs  Lab 11/18/18 1744  11/22/18 0249  NA 134*   < > 136  K 4.8   < > 4.2  CL 104   < > 105  CO2 23   < > 23  GLUCOSE 106*   < > 90  BUN 17   < > 17  CREATININE 0.85   < > 0.82  CALCIUM 7.8*   < > 8.1*  MG 2.5*  --   --    < > = values in this interval not displayed.    Cardiac Enzymes Recent Labs  Lab 11/22/18 0249  TROPONINI <0.03    Microbiology Results  Results for orders placed or performed during the hospital encounter of 11/18/18  Urine Culture     Status: Abnormal   Collection Time: 11/18/18  5:44 PM  Result Value Ref Range Status   Specimen Description   Final    URINE, RANDOM Performed at [facility], [address], [place], [place] [address]    Special Requests   Final    NONE Performed at [facility], [address] [place], [place] [address]    Culture >=100,000 COLONIES/mL LACTOBACILLUS SPECIES (A)  Final   Report Status 11/20/2018 FINAL  Final    RADIOLOGY:  Ct Angio Neck W Or Wo Contrast  Result Date: 11/21/2018 CLINICAL DATA:   Hypotension during a stress test. Near syncope. History of thyroid cancer. EXAM: CT ANGIOGRAPHY NECK TECHNIQUE: Multidetector CT imaging of the neck was performed using the standard protocol during bolus administration of intravenous contrast. Multiplanar CT image reconstructions and MIPs were obtained to evaluate the vascular anatomy. Carotid stenosis measurements (when applicable) are obtained utilizing NASCET criteria, using the distal internal carotid diameter as the denominator. CONTRAST:  35mL OMNIPAQUE IOHEXOL 350 MG/ML SOLN COMPARISON:  PET-CT 08/07/2018. Soft tissue neck CT 12/05/2013. FINDINGS: Aortic arch: Standard 3 vessel aortic arch with mild atherosclerotic plaque. Widely patent arch vessel origins. Soft plaque throughout the proximal left subclavian artery resulting in less than 50% stenosis. Right carotid system: Patent with mild plaque at the carotid bifurcation. No evidence of stenosis or dissection. Left carotid system: Patent with a focal, less than 50% stenosis of the mid common carotid artery. Predominantly calcified plaque at the carotid bifurcation not resulting in stenosis. No evidence of dissection. Vertebral arteries: Patent and codominant without evidence of flow limiting stenosis or dissection. Tortuous proximal left V1 segments with up to mild narrowing. Skeleton: Advanced disc and facet degeneration in the cervical and upper thoracic spine. Moderately advanced median C1-2 arthropathy. Other neck: Fatty atrophy of the submandibular and parotid glands. Status post thyroidectomy similar appearance of left lower neck lymph nodes compared to the prior PET-CT. 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 Upper chest: Mild biapical lung scarring. IMPRESSION: 1. Mild atherosclerosis without significant cervical carotid or vertebral artery stenosis or dissection. 2.  Aortic Atherosclerosis (ICD10-I70.0). Electronically Signed   By: Logan Bores M.D.   On:  11/21/2018 15:52   Mr Brain Wo Contrast  Result Date: 11/21/2018 CLINICAL DATA:  Acute headache EXAM: MRI HEAD WITHOUT CONTRAST TECHNIQUE: Multiplanar, multiecho pulse sequences of the brain and surrounding structures were obtained without intravenous contrast. COMPARISON:  CTA neck 11/21/2018 Brain MRI 01/12/2017 FINDINGS: BRAIN: There is no acute infarct, acute hemorrhage, hydrocephalus or extra-axial collection. 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 Extension into the clivus is unchanged. The mass measures approximately 3.0 x  3.8 by 2.8 cm. There is magnetic susceptibility effect within the mass. Early confluent hyperintense T2-weighted signal of the periventricular and deep white matter, most commonly due to chronic ischemic microangiopathy. Generalized atrophy without lobar predilection. Susceptibility-sensitive sequences show no chronic microhemorrhage or superficial siderosis. VASCULAR: Major intracranial arterial and venous sinus flow voids are normal. SKULL AND UPPER CERVICAL SPINE: Calvarial bone marrow signal is normal. There is no skull base mass. Visualized upper cervical spine and soft tissues are normal. SINUSES/ORBITS: Moderate paranasal sinus mucosal thickening with fluid levels in both maxillary sinuses. The orbits are normal. IMPRESSION: 1. No acute intracranial abnormality. 2. Chronic ischemic microangiopathy. 3. 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 4. Moderate paranasal sinus mucosal thickening with bilateral maxillary sinus fluid levels. Correlate for symptoms of acute sinusitis. This could account for the patient's headache. 5. Assess stone for small parenchymal metastases limited without IV contrast. Electronically Signed   By: Ulyses Jarred M.D.   On: 11/21/2018 21:48   Dg Chest Port 1 View  Result Date:  11/21/2018 CLINICAL DATA:  Chest pain. EXAM: PORTABLE CHEST 1 VIEW COMPARISON:  11/03/2018. Chest CTA dated 11/03/2018 FINDINGS: Previously demonstrated nodule at the right lung base. Other previously demonstrated nodules are not visible. Minimal linear atelectasis at the left lung base. Clear right lung. Normal sized heart. Diffuse osteopenia IMPRESSION: 1. Minimal left basilar atelectasis. 2. Previously demonstrated right basilar nodule. Electronically Signed   By: Claudie Revering M.D.   On: 11/21/2018 13:25    Follow up with PCP in 1 week.  Management plans discussed with the patient, family and they are in agreement.  CODE STATUS: Full code    Code Status Orders  (From admission, onward)         Start     Ordered   11/21/18 1906  Full code  Continuous     11/21/18 1905        Code Status History    Date Active Date Inactive Code Status Order ID Comments User Context   11/03/2018 1849 11/04/2018 1804 DNR [id number]  [name], MD Inpatient   05/19/2015 1612 05/19/2015 2013 Full Code [id number]  Dereck Leep, MD Inpatient    Advance Directive Documentation     Most Recent Value  Type of Advance Directive  Living will  Pre-existing out of facility DNR order (yellow form or pink MOST form)  -  "MOST" Form in Place?  -      TOTAL TIME TAKING CARE OF THIS PATIENT ON DAY OF DISCHARGE: more than 35 minutes.   Saundra Shelling M.D on 11/22/2018 at 2:54 PM  Between 7am to 6pm - Pager - [phone]  After 6pm go to [email or web address] - password [email or web address] [facility]  Office  [phone]  CC: Primary care physician; Idelle Crouch, MD  Note: This dictation was prepared with Dragon dictation along with smaller phrase technology. Any transcriptional errors that result from this process are unintentional.

## 2018-11-22 NOTE — Discharge Instructions (Signed)
Hypotension As your heart beats, it forces blood through your body. Hypotension, commonly called low blood pressure, is when the force of blood pumping through your arteries is too weak. Arteries are blood vessels that carry blood from the heart throughout the body. Depending on the cause and severity, hypotension may be harmless (benign) or may cause serious problems (be critical). When blood pressure is too low, you may not get enough blood to your brain or to the rest of your organs. This can cause weakness, light-headedness, rapid heartbeat, and fainting. What are the causes? This condition may be caused by:  Blood loss.  Loss of body fluids (dehydration).  Heart problems.  Hormone (endocrine) problems.  Pregnancy.  Severe infection.  Lack of certain nutrients.  Severe allergic reactions (anaphylaxis).  Certain medicines, such as blood pressure medicine or medicines that make the body lose excess fluids (diuretics). Sometimes, hypotension may be caused by not taking medicine as directed, such as taking too much of a certain medicine. What increases the risk? The following factors may make you more likely to develop this condition:  Age. Risk increases as you get older.  Conditions that affect the heart or the central nervous system.  Taking certain medicines, such as blood pressure medicine or diuretics.  Being pregnant. What are the signs or symptoms? Common symptoms of this condition include:  Weakness.  Light-headedness.  Dizziness.  Blurred vision.  Fatigue.  Rapid heartbeat.  Fainting, in severe cases. How is this diagnosed? This condition is diagnosed based on:  Your medical history.  Your symptoms.  Your blood pressure measurement. Your health care provider will check your blood pressure when you are: ? Lying down. ? Sitting. ? Standing. A blood pressure reading is recorded as two numbers, such as "120 over 80" (or 120/80). The first ("top")  number is called the systolic pressure. It is a measure of the pressure in your arteries as your heart beats. The second ("bottom") number is called the diastolic pressure. It is a measure of the pressure in your arteries when your heart relaxes between beats. Blood pressure is measured in a unit called mm Hg. Healthy blood pressure for most adults is 120/80. If your blood pressure is below 90/60, you may be diagnosed with hypotension. Other information or tests that may be used to diagnose hypotension include:  Your other vital signs, such as your heart rate and temperature.  Blood tests.  Tilt table test. For this test, you will be safely secured to a table that moves you from a lying position to an upright position. Your heart rhythm and blood pressure will be monitored during the test. How is this treated? Treatment for this condition may include:  Changing your diet. This may involve eating more salt (sodium) or drinking more water.  Taking medicines to raise your blood pressure.  Changing the dosage of certain medicines you are taking that might be lowering your blood pressure.  Wearing compression stockings. These stockings help to prevent blood clots and reduce swelling in your legs. In some cases, you may need to go to the hospital for:  Fluid replacement. This means you will receive fluids through an IV.  Blood replacement. This means you will receive donated blood through an IV (transfusion).  Treating an infection or heart problems, if this applies.  Monitoring. You may need to be monitored while medicines that you are taking wear off. Follow these instructions at home: Eating and drinking   Drink enough fluid to keep your  Follow these instructions at home:  Eating and drinking    Drink enough fluid to keep your urine pale yellow.  Eat a healthy diet, and follow instructions from your health care provider about eating or drinking restrictions. A healthy diet includes:  Fresh fruits and vegetables.  Whole grains.  Lean meats.  Low-fat dairy products.  Eat extra salt only as directed. Do not add extra salt to your diet unless your health care  provider told you to do that.  Eat frequent, small meals.  Avoid standing up suddenly after eating.  Medicines  Take over-the-counter and prescription medicines only as told by your health care provider.  Follow instructions from your health care provider about changing the dosage of your current medicines, if this applies.  Do not stop or adjust any of your medicines on your own.  General instructions    Wear compression stockings as told by your health care provider.  Get up slowly from lying down or sitting positions. This gives your blood pressure a chance to adjust.  Avoid hot showers and excessive heat as directed by your health care provider.  Return to your normal activities as told by your health care provider. Ask your health care provider what activities are safe for you.  Do not use any products that contain nicotine or tobacco, such as cigarettes, e-cigarettes, and chewing tobacco. If you need help quitting, ask your health care provider.  Keep all follow-up visits as told by your health care provider. This is important.  Contact a health care provider if you:  Vomit.  Have diarrhea.  Have a fever for more than 2-3 days.  Feel more thirsty than usual.  Feel weak and tired.  Get help right away if you:  Have chest pain.  Have a fast or irregular heartbeat.  Develop numbness in any part of your body.  Cannot move your arms or your legs.  Have trouble speaking.  Become sweaty or feel light-headed.  Faint.  Feel short of breath.  Have trouble staying awake.  Feel confused.  Summary  Hypotension is when the force of blood pumping through your arteries is too weak.  Hypotension may be harmless (benign) or may cause serious problems (be critical).  Treatment for this condition may include changing your diet, changing your medicines, and wearing compression stockings.  In some cases, you may need to go to the hospital for fluid or blood replacement.  This information is not intended to replace advice given to  you by your health care provider. Make sure you discuss any questions you have with your health care provider.  Document Released: 09/13/2005 Document Revised: 03/09/2018 Document Reviewed: 03/09/2018  Elsevier Interactive Patient Education  2019 [employer]

## 2018-11-22 NOTE — Progress Notes (Signed)
Advanced care plan. Purpose of the Encounter: CODE STATUS Parties in Attendance: Patient and daughter Patient's Decision Capacity: Good Subjective/Patient's story: Presented to emergency room for dizziness and passing out Objective/Medical story Patient has recurrent episodes of syncope Also had bradycardia Needs cardiology evaluation Goals of care determination:  Advance care directives goals of care and treatment plan discussed For now patient is full resuscitation CODE STATUS: Full code Time spent discussing advanced care planning: 16 minutes

## 2018-11-22 NOTE — Plan of Care (Signed)
  Problem: Health Behavior/Discharge Planning: Goal: Ability to manage health-related needs will improve Outcome: Progressing Note:  Patient may need an OP event monitor upon discharge. Discharge possibly pending for later in shift. Will continue to monitor general progress. Wenda Low [facility]

## 2018-11-23 ENCOUNTER — Ambulatory Visit: Payer: Medicare Other

## 2018-11-30 ENCOUNTER — Emergency Department: Payer: Medicare Other

## 2018-11-30 ENCOUNTER — Other Ambulatory Visit: Payer: Self-pay

## 2018-11-30 ENCOUNTER — Encounter: Payer: Self-pay | Admitting: Emergency Medicine

## 2018-11-30 ENCOUNTER — Inpatient Hospital Stay: Payer: Medicare Other

## 2018-11-30 ENCOUNTER — Inpatient Hospital Stay
Admission: EM | Admit: 2018-11-30 | Discharge: 2018-12-02 | DRG: 244 | Disposition: A | Discharge status: Home / Self Care | Payer: Medicare Other | Attending: Internal Medicine | Admitting: Internal Medicine

## 2018-11-30 ENCOUNTER — Other Ambulatory Visit: Payer: Self-pay | Admitting: Cardiology

## 2018-11-30 DIAGNOSIS — Z950 Presence of cardiac pacemaker: Secondary | ICD-10-CM

## 2018-11-30 DIAGNOSIS — Z79890 Hormone replacement therapy (postmenopausal): Secondary | ICD-10-CM

## 2018-11-30 DIAGNOSIS — I10 Essential (primary) hypertension: Secondary | ICD-10-CM | POA: Diagnosis present

## 2018-11-30 DIAGNOSIS — F329 Major depressive disorder, single episode, unspecified: Secondary | ICD-10-CM | POA: Diagnosis present

## 2018-11-30 DIAGNOSIS — Z886 Allergy status to analgesic agent status: Secondary | ICD-10-CM | POA: Diagnosis not present

## 2018-11-30 DIAGNOSIS — Z8249 Family history of ischemic heart disease and other diseases of the circulatory system: Secondary | ICD-10-CM | POA: Diagnosis not present

## 2018-11-30 DIAGNOSIS — Z882 Allergy status to sulfonamides status: Secondary | ICD-10-CM

## 2018-11-30 DIAGNOSIS — E890 Postprocedural hypothyroidism: Secondary | ICD-10-CM | POA: Diagnosis present

## 2018-11-30 DIAGNOSIS — I428 Other cardiomyopathies: Secondary | ICD-10-CM | POA: Diagnosis present

## 2018-11-30 DIAGNOSIS — R001 Bradycardia, unspecified: Principal | ICD-10-CM | POA: Diagnosis present

## 2018-11-30 DIAGNOSIS — Z880 Allergy status to penicillin: Secondary | ICD-10-CM

## 2018-11-30 DIAGNOSIS — K589 Irritable bowel syndrome without diarrhea: Secondary | ICD-10-CM | POA: Diagnosis present

## 2018-11-30 DIAGNOSIS — Z888 Allergy status to other drugs, medicaments and biological substances status: Secondary | ICD-10-CM | POA: Diagnosis not present

## 2018-11-30 DIAGNOSIS — Z96642 Presence of left artificial hip joint: Secondary | ICD-10-CM | POA: Diagnosis present

## 2018-11-30 DIAGNOSIS — Z79891 Long term (current) use of opiate analgesic: Secondary | ICD-10-CM | POA: Diagnosis not present

## 2018-11-30 DIAGNOSIS — R55 Syncope and collapse: Secondary | ICD-10-CM

## 2018-11-30 DIAGNOSIS — G894 Chronic pain syndrome: Secondary | ICD-10-CM | POA: Diagnosis present

## 2018-11-30 DIAGNOSIS — Z885 Allergy status to narcotic agent status: Secondary | ICD-10-CM | POA: Diagnosis not present

## 2018-11-30 DIAGNOSIS — E892 Postprocedural hypoparathyroidism: Secondary | ICD-10-CM | POA: Diagnosis present

## 2018-11-30 DIAGNOSIS — D509 Iron deficiency anemia, unspecified: Secondary | ICD-10-CM | POA: Diagnosis present

## 2018-11-30 DIAGNOSIS — Z85850 Personal history of malignant neoplasm of thyroid: Secondary | ICD-10-CM

## 2018-11-30 DIAGNOSIS — K219 Gastro-esophageal reflux disease without esophagitis: Secondary | ICD-10-CM | POA: Diagnosis present

## 2018-11-30 DIAGNOSIS — J302 Other seasonal allergic rhinitis: Secondary | ICD-10-CM | POA: Diagnosis present

## 2018-11-30 DIAGNOSIS — G629 Polyneuropathy, unspecified: Secondary | ICD-10-CM | POA: Diagnosis present

## 2018-11-30 DIAGNOSIS — Z79899 Other long term (current) drug therapy: Secondary | ICD-10-CM | POA: Diagnosis not present

## 2018-11-30 DIAGNOSIS — Z96651 Presence of right artificial knee joint: Secondary | ICD-10-CM | POA: Diagnosis present

## 2018-11-30 LAB — URINALYSIS, COMPLETE (UACMP) WITH MICROSCOPIC
Bilirubin Urine: NEGATIVE
Glucose, UA: NEGATIVE mg/dL
Hgb urine dipstick: NEGATIVE
Ketones, ur: NEGATIVE mg/dL
Nitrite: NEGATIVE
Protein, ur: NEGATIVE mg/dL
SPECIFIC GRAVITY, URINE: 1.01 (ref 1.005–1.030)
pH: 7 (ref 5.0–8.0)

## 2018-11-30 LAB — CBC
HCT: 36.2 % (ref 36.0–46.0)
HEMOGLOBIN: 11.3 g/dL — AB (ref 12.0–15.0)
MCH: 29.4 pg (ref 26.0–34.0)
MCHC: 31.2 g/dL (ref 30.0–36.0)
MCV: 94 fL (ref 80.0–100.0)
Platelets: 271 10*3/uL (ref 150–400)
RBC: 3.85 MIL/uL — ABNORMAL LOW (ref 3.87–5.11)
RDW: 13.7 % (ref 11.5–15.5)
WBC: 8.2 10*3/uL (ref 4.0–10.5)
nRBC: 0 % (ref 0.0–0.2)

## 2018-11-30 LAB — SURGICAL PCR SCREEN
MRSA, PCR: NEGATIVE
Staphylococcus aureus: POSITIVE — AB

## 2018-11-30 LAB — BASIC METABOLIC PANEL
Anion gap: 9 (ref 5–15)
BUN: 14 mg/dL (ref 8–23)
CO2: 23 mmol/L (ref 22–32)
Calcium: 8.5 mg/dL — ABNORMAL LOW (ref 8.9–10.3)
Chloride: 104 mmol/L (ref 98–111)
Creatinine, Ser: 0.87 mg/dL (ref 0.44–1.00)
GFR calc Af Amer: >60 mL/min (ref >60)
GFR calc non Af Amer: >60 mL/min (ref >60)
Glucose, Bld: 107 mg/dL — ABNORMAL HIGH (ref 70–99)
Potassium: 4.6 mmol/L (ref 3.5–5.1)
Sodium: 136 mmol/L (ref 135–145)

## 2018-11-30 LAB — TROPONIN I

## 2018-11-30 LAB — BRAIN NATRIURETIC PEPTIDE: B Natriuretic Peptide: 47 pg/mL (ref 0.0–100.0)

## 2018-11-30 LAB — CORTISOL: Cortisol, Plasma: 21.2 ug/dL

## 2018-11-30 MED ORDER — PANTOPRAZOLE SODIUM 40 MG PO TBEC
40.0000 mg | DELAYED_RELEASE_TABLET | Freq: Every day | ORAL | Status: DC
  Administered 2018-12-02: 40 mg via ORAL
  Filled 2018-11-30: qty 1

## 2018-11-30 MED ORDER — CHLORHEXIDINE GLUCONATE 4 % EX LIQD: 60.0000 mL | Freq: Once | CUTANEOUS | Status: DC

## 2018-11-30 MED ORDER — GABAPENTIN 400 MG PO CAPS: 800.0000 mg | ORAL_CAPSULE | Freq: Two times a day (BID) | ORAL | Status: DC

## 2018-11-30 MED ORDER — ACETAMINOPHEN 650 MG RE SUPP: 650.0000 mg | Freq: Four times a day (QID) | RECTAL | Status: DC | PRN

## 2018-11-30 MED ORDER — SODIUM CHLORIDE 0.9 % IV SOLN: 250.0000 mL | INTRAVENOUS | Status: DC

## 2018-11-30 MED ORDER — SODIUM CHLORIDE 0.9% FLUSH: 3.0000 mL | INTRAVENOUS | Status: DC | PRN

## 2018-11-30 MED ORDER — CEFAZOLIN SODIUM-DEXTROSE 2-4 GM/100ML-% IV SOLN
2.0000 g | INTRAVENOUS | Status: AC
  Administered 2018-12-01: 2 g via INTRAVENOUS
  Filled 2018-11-30: qty 100

## 2018-11-30 MED ORDER — DOCUSATE SODIUM 100 MG PO CAPS
100.0000 mg | ORAL_CAPSULE | Freq: Two times a day (BID) | ORAL | Status: DC
  Administered 2018-12-01 – 2018-12-02 (×2): 100 mg via ORAL
  Filled 2018-11-30 (×2): qty 1

## 2018-11-30 MED ORDER — SODIUM CHLORIDE 0.9 % IV BOLUS
500.0000 mL | Freq: Once | INTRAVENOUS | Status: AC
  Administered 2018-11-30: 500 mL via INTRAVENOUS

## 2018-11-30 MED ORDER — POLYETHYLENE GLYCOL 3350 17 G PO PACK: 17.0000 g | PACK | Freq: Every day | ORAL | Status: DC | PRN

## 2018-11-30 MED ORDER — ALBUTEROL SULFATE (2.5 MG/3ML) 0.083% IN NEBU
2.5000 mg | INHALATION_SOLUTION | Freq: Four times a day (QID) | RESPIRATORY_TRACT | Status: DC
  Administered 2018-11-30: 2.5 mg via RESPIRATORY_TRACT
  Filled 2018-11-30: qty 3

## 2018-11-30 MED ORDER — MAGNESIUM OXIDE 400 (241.3 MG) MG PO TABS
200.0000 mg | ORAL_TABLET | Freq: Every day | ORAL | Status: DC
  Administered 2018-12-02: 200 mg via ORAL
  Filled 2018-11-30: qty 1

## 2018-11-30 MED ORDER — DULOXETINE HCL 30 MG PO CPEP
60.0000 mg | ORAL_CAPSULE | Freq: Every day | ORAL | Status: DC
  Administered 2018-12-02: 60 mg via ORAL
  Filled 2018-11-30 (×3): qty 2

## 2018-11-30 MED ORDER — SODIUM CHLORIDE 0.9% FLUSH: 3.0000 mL | Freq: Two times a day (BID) | INTRAVENOUS | Status: DC

## 2018-11-30 MED ORDER — TRAMADOL HCL 50 MG PO TABS: 50.0000 mg | ORAL_TABLET | Freq: Four times a day (QID) | ORAL | Status: DC | PRN

## 2018-11-30 MED ORDER — ONDANSETRON HCL 4 MG/2ML IJ SOLN
4.0000 mg | Freq: Once | INTRAMUSCULAR | Status: AC
  Administered 2018-11-30: 4 mg via INTRAVENOUS
  Filled 2018-11-30: qty 2

## 2018-11-30 MED ORDER — SODIUM CHLORIDE 0.9 % IV SOLN
INTRAVENOUS | Status: DC
  Administered 2018-11-30: 19:00:00 via INTRAVENOUS

## 2018-11-30 MED ORDER — ALBUTEROL SULFATE (2.5 MG/3ML) 0.083% IN NEBU: 2.5000 mg | INHALATION_SOLUTION | Freq: Four times a day (QID) | RESPIRATORY_TRACT | Status: DC | PRN

## 2018-11-30 MED ORDER — POTASSIUM CHLORIDE CRYS ER 10 MEQ PO TBCR
10.0000 meq | EXTENDED_RELEASE_TABLET | Freq: Every day | ORAL | Status: DC
  Administered 2018-12-02: 10 meq via ORAL
  Filled 2018-11-30: qty 1

## 2018-11-30 MED ORDER — ACETAMINOPHEN 325 MG PO TABS
650.0000 mg | ORAL_TABLET | Freq: Every day | ORAL | Status: DC
  Administered 2018-12-02: 650 mg via ORAL
  Filled 2018-11-30: qty 2

## 2018-11-30 MED ORDER — SODIUM CHLORIDE 0.9 % IV SOLN
250.0000 mL | INTRAVENOUS | Status: DC | PRN
  Administered 2018-12-01: 250 mL via INTRAVENOUS
  Administered 2018-12-01: 12:00:00 via INTRAVENOUS

## 2018-11-30 MED ORDER — PRESERVISION AREDS 2 PO CAPS: 1.0000 | ORAL_CAPSULE | Freq: Two times a day (BID) | ORAL | Status: DC

## 2018-11-30 MED ORDER — SODIUM CHLORIDE 0.9% FLUSH
3.0000 mL | Freq: Two times a day (BID) | INTRAVENOUS | Status: DC
  Administered 2018-12-01 – 2018-12-02 (×2): 3 mL via INTRAVENOUS

## 2018-11-30 MED ORDER — CHLORHEXIDINE GLUCONATE 4 % EX LIQD
60.0000 mL | Freq: Once | CUTANEOUS | Status: AC
  Administered 2018-12-01: 4 via TOPICAL

## 2018-11-30 MED ORDER — DIPHENHYDRAMINE HCL 25 MG PO CAPS
25.0000 mg | ORAL_CAPSULE | Freq: Four times a day (QID) | ORAL | Status: DC | PRN
  Administered 2018-11-30 – 2018-12-01 (×2): 25 mg via ORAL
  Filled 2018-11-30 (×2): qty 1

## 2018-11-30 MED ORDER — ACETAMINOPHEN 325 MG PO TABS: 650.0000 mg | ORAL_TABLET | Freq: Four times a day (QID) | ORAL | Status: DC | PRN

## 2018-11-30 MED ORDER — SODIUM CHLORIDE 0.9 % IV SOLN
80.0000 mg | INTRAVENOUS | Status: DC
  Filled 2018-11-30: qty 2

## 2018-11-30 MED ORDER — LEVOTHYROXINE SODIUM 100 MCG PO TABS
100.0000 ug | ORAL_TABLET | Freq: Every day | ORAL | Status: DC
  Administered 2018-12-01 – 2018-12-02 (×2): 100 ug via ORAL
  Filled 2018-11-30 (×2): qty 1

## 2018-11-30 MED ORDER — FERROUS SULFATE 325 (65 FE) MG PO TABS: 325.0000 mg | ORAL_TABLET | Freq: Every day | ORAL | Status: DC

## 2018-11-30 NOTE — ED Provider Notes (Signed)
[facility] Emergency Department Provider Note       Time seen: ----------------------------------------- 10:31 AM on 11/30/2018 -----------------------------------------   I have reviewed the triage vital signs and the nursing notes.  HISTORY   Chief Complaint Loss of Consciousness    HPI Laura Walters is a 85 y.o. female with a history of arthritis, cardiomyopathy, degenerative disc disease, hypertension, hypothyroidism and recently syncope who presents to the ED for a syncopal episode.  Patient was at home when she had a syncopal event.  Initially it was thought that she had lost a pulse.  Paramedics confirmed that she did have a pulse that was just slow.  She is currently wearing an event monitor and was recently admitted into the hospital twice for syncope.  No exact etiology has been discovered at this point.  She did vomit prior to arrival.  Past Medical History:  Diagnosis Date  . Arthritis   . Asthma   . Back pain   . Cancer of thyroid ([place]) 03/30/2015  . Cardiomyopathy ([place])    idiopathic  . Chest pain    non cardiac  . Chokes    easily   swallow test negative  . Constipation   . DDD (degenerative disc disease), lumbar   . Disc disorder   . GERD (gastroesophageal reflux disease)   . H/O wheezing    advair as needed  . Hypertension   . Hypothyroidism   . IBS (irritable bowel syndrome)   . Neuropathy   . Nocturia   . RAD (reactive airway disease)   . Rectocele   . Seasonal allergies   . Spinal stenosis   . Urge incontinence   . Vaginal atrophy   . Vaginal polyp     Patient Active Problem List   Diagnosis Date Noted  . Bradycardia 11/21/2018  . NSTEMI (non-ST elevated myocardial infarction) ([place]) 11/18/2018  . Syncope 11/03/2018  . Vaginal bleeding 08/02/2018  . Abrasion of vagina 08/02/2018  . Goals of care, counseling/discussion 01/05/2018  . Uterine prolapse 02/03/2017  . Pulmonary metastases ([place]) 04/09/2016  . RAD  (reactive airway disease) 02/27/2016  . Recurrent sinus infections 01/31/2016  . H/O total knee replacement 01/20/2016  . S/P total knee arthroplasty 01/05/2016  . 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 Arthropathy, traumatic, knee 12/16/2015  . Cystocele, midline 12/02/2015  . Rectocele 12/02/2015  . Frequent UTI 11/26/2015  . Acquired hypothyroidism 10/13/2015  . Pituitary tumor 10/13/2015  . Degeneration of intervertebral disc of lumbar region 08/01/2015  . Lumbar canal stenosis 08/01/2015  . Neuritis or radiculitis due to rupture of lumbar intervertebral disc 08/01/2015  . LBP (low back pain) 03/30/2015  . Neuropathy ([place]) 03/30/2015  . Chest pain, non-cardiac 03/30/2015  . Neoplasm of pituitary gland 03/30/2015  . Cancer of thyroid ([place]) 03/30/2015  . Combined fat and carbohydrate induced hyperlipemia 10/17/2014  . Primary cardiomyopathy ([place]) 10/14/2014    Past Surgical History:  Procedure Laterality Date  . BREAST BIOPSY Right 86 and 92   2 bx-neg  . CATARACT EXTRACTION W/PHACO Right 05/19/2016   Procedure: CATARACT EXTRACTION PHACO AND INTRAOCULAR LENS PLACEMENT (IOC);  Surgeon: Estill Cotta, MD;  Location: [facility] ORS;  Service: Ophthalmology;  Laterality: Right;  Lot# [id number] [place]:   1:25.3 AP%:  24.9% CDE:  40.11  . CATARACT EXTRACTION W/PHACO Left 08/11/2016   Procedure: CATARACT EXTRACTION PHACO AND INTRAOCULAR LENS PLACEMENT (IOC);  Surgeon: Estill Cotta, MD;  Location: [facility] ORS;  Service: Ophthalmology;  Laterality:  Left;  [place] 1.24 AP% 26.1 CDE 37.79 FLUID PACK LOT # [id number]  . DILATION AND CURETTAGE OF UTERUS    . HEMORROIDECTOMY     x 2  . JOINT REPLACEMENT     left hip  . KNEE ARTHROPLASTY Right 01/05/2016   Procedure: COMPUTER ASSISTED TOTAL KNEE ARTHROPLASTY;  Surgeon: Dereck Leep, MD;  Location: [facility] ORS;  Service: Orthopedics;  Laterality: Right;  . KNEE ARTHROSCOPY Right 05/19/2015   Procedure: Right knee arthrosocpy  medail and lateral menisectomy, chondroplasty ;  Surgeon: Dereck Leep, MD;  Location: [facility] ORS;  Service: Orthopedics;  Laterality: Right;  . Left total hip arthroplasty    . PAROTIDECTOMY    . PITUITARY SURGERY    . THYROID SURGERY     bx  . THYROIDECTOMY      Allergies Acetaminophen-codeine; Aleve [naproxen]; Penicillins; Sulfa antibiotics; and Codeine  Social History Social History   Tobacco Use  . Smoking status: Never Smoker  . Smokeless tobacco: Never Used  Substance Use Topics  . Alcohol use: No  . Drug use: No   Review of Systems Constitutional: Negative for fever. Cardiovascular: Negative for chest pain.  Positive for syncope Respiratory: Negative for shortness of breath. Gastrointestinal: Negative for abdominal pain, positive for vomiting Musculoskeletal: Negative for back pain. Skin: Negative for rash. Neurological: Negative for headaches, focal weakness or numbness.  All systems negative/normal/unremarkable except as stated in the HPI  ____________________________________________   PHYSICAL EXAM:  VITAL SIGNS: ED Triage Vitals  Enc Vitals Group     BP      Pulse      Resp      Temp      Temp src      SpO2      Weight      Height      Head Circumference      Peak Flow      Pain Score      Pain Loc      Pain Edu?      Excl. in [place]?    Constitutional: Alert and oriented.  Mild distress Eyes: Conjunctivae are normal. Normal extraocular movements. ENT      Head: Normocephalic and atraumatic.      Nose: No congestion/rhinnorhea.      Mouth/Throat: Mucous membranes are moist.      Neck: No stridor. Cardiovascular: Slow rate, regular rhythm. No murmurs, rubs, or gallops. Respiratory: Normal respiratory effort without tachypnea nor retractions. Breath sounds are clear and equal bilaterally. No wheezes/rales/rhonchi. Gastrointestinal: Soft and nontender. Normal bowel sounds Musculoskeletal: Nontender with normal range of motion in extremities. No  lower extremity tenderness nor edema. Neurologic:  Normal speech and language. No gross focal neurologic deficits are appreciated.  Skin:  Skin is warm, dry and intact. No rash noted. Psychiatric: Mood and affect are normal. Speech and behavior are normal.  ____________________________________________  EKG: Interpreted by me.  Sinus bradycardia with a rate of 52 bpm, normal PR interval, normal QRS, nonspecific T wave changes  ____________________________________________  ED COURSE:  As part of my medical decision making, I reviewed the following data within the [place] History obtained from family if available, nursing notes, old chart and ekg, as well as notes from prior ED visits. Patient presented for syncope, we will assess with labs and imaging as indicated at this time. Clinical Course as of Nov 30 1110  Thu Nov 30, 2018  1111 Patient was discussed with cardiology, plan will be for admission  and pacemaker placement tomorrow.   [JW]    Clinical Course User Index [JW] Earleen Newport, MD   Procedures ____________________________________________   LABS (pertinent positives/negatives)  Labs Reviewed  BASIC METABOLIC PANEL - Abnormal; Notable for the following components:      Result Value   Glucose, Bld 107 (*)    Calcium 8.5 (*)    All other components within normal limits  CBC - Abnormal; Notable for the following components:   RBC 3.85 (*)    Hemoglobin 11.3 (*)    All other components within normal limits  TROPONIN I  BRAIN NATRIURETIC PEPTIDE  URINALYSIS, COMPLETE (UACMP) WITH MICROSCOPIC  CORTISOL    RADIOLOGY Images were viewed by me  Chest x-ray IMPRESSION: 1. No evidence of acute disease. 2. Pulmonary nodules that are known from recent chest CT. ____________________________________________   DIFFERENTIAL DIAGNOSIS   Arrhythmia, MI, unstable angina, vasovagal event, aortic stenosis  FINAL ASSESSMENT AND PLAN  Recurrent  syncope, bradycardia   Plan: The patient had presented for recurrent syncope. Patient's labs did not reveal any acute process. Patient's imaging was unremarkable with the exception of pulmonary nodules.  Patient has been evaluated by Dr. Ubaldo Glassing while in the ED.  Should be admitted with likely pacemaker placement tomorrow.   Laurence Aly, MD    Note: This note was generated in part or whole with voice recognition software. Voice recognition is usually quite accurate but there are transcription errors that can and very often do occur. I apologize for any typographical errors that were not detected and corrected.     Earleen Newport, MD 11/30/18 1209

## 2018-11-30 NOTE — Consult Note (Addendum)
Cardiology Consultation Note    Patient ID: CHARNICE Walters, MRN: [id number], DOB/AGE: 1933-06-02 85 y.o. Admit date: 11/30/2018   Date of Consult: 11/30/2018 Primary Physician: Idelle Crouch, MD Primary Cardiologist: Dr. Ubaldo Glassing  Chief Complaint: syncope Reason for Consultation: syncope Requesting MD: Dr. Jimmye Norman  HPI: Laura Walters is a 85 y.o. female with history of cardiomyopathy with ef of 30% in the past but more recent echos done 1 month ago reveals preserved ef of 50-55%,  history of chronic bradycardia with multiple presentations to the emergency room with syncope.  She had another episode of syncope today.  She has a 30-day event monitor in place but she did not press the device.  Results of the device reading are not available at present.  She states she was feeling fine and then got very lightheaded followed by an episode of syncope.  This is similar to her previous events.  Previous admissions she has had sinus bradycardia in the 40s and 50s with no prolonged pauses or no sustained ventricular arrhythmias.  She is currently hemodynamically stable.  Past Medical History:  Diagnosis Date  . Arthritis   . Asthma   . Back pain   . Cancer of thyroid ([place]) 03/30/2015  . Cardiomyopathy ([place])    idiopathic  . Chest pain    non cardiac  . Chokes    easily   swallow test negative  . Constipation   . DDD (degenerative disc disease), lumbar   . Disc disorder   . GERD (gastroesophageal reflux disease)   . H/O wheezing    advair as needed  . Hypertension   . Hypothyroidism   . IBS (irritable bowel syndrome)   . Neuropathy   . Nocturia   . RAD (reactive airway disease)   . Rectocele   . Seasonal allergies   . Spinal stenosis   . Urge incontinence   . Vaginal atrophy   . Vaginal polyp       Surgical History:  Past Surgical History:  Procedure Laterality Date  . BREAST BIOPSY Right 86 and 92   2 bx-neg  . CATARACT EXTRACTION W/PHACO Right 05/19/2016   Procedure: CATARACT  EXTRACTION PHACO AND INTRAOCULAR LENS PLACEMENT (IOC);  Surgeon: Estill Cotta, MD;  Location: [facility] ORS;  Service: Ophthalmology;  Laterality: Right;  Lot# [id number] [place]:   1:25.3 AP%:  24.9% CDE:  40.11  . CATARACT EXTRACTION W/PHACO Left 08/11/2016   Procedure: CATARACT EXTRACTION PHACO AND INTRAOCULAR LENS PLACEMENT (IOC);  Surgeon: Estill Cotta, MD;  Location: [facility] OR[facility];  Service: Ophthalmology;  Laterality: Left;  [place] 1.24 AP% 26.1 CDE 37.79 FLUID PACK LOT # [id number]  . DILATION AND CURETTAGE OF UTERUS    . HEMORROIDECTOMY     x 2  . JOINT REPLACEMENT     left hip  . KNEE ARTHROPLASTY Right 01/05/2016   Procedure: COMPUTER ASSISTED TOTAL KNEE ARTHROPLASTY;  Surgeon: Dereck Leep, MD;  Location: [facility] OR[facility];  Service: Orthopedics;  Laterality: Right;  . KNEE ARTHROSCOPY Right 05/19/2015   Procedure: Right knee arthrosocpy medail and lateral menisectomy, chondroplasty ;  Surgeon: Dereck Leep, MD;  Location: [facility] OR[facility];  Service: Orthopedics;  Laterality: Right;  . Left total hip arthroplasty    . PAROTIDECTOMY    . PITUITARY SURGERY    . THYROID SURGERY     bx  . THYROIDECTOMY       Home Meds: Prior to Admission medications   Medication Sig Start Date End Date Taking? Authorizing  Provider  acetaminophen (TYLENOL) 325 MG tablet Take 650 mg by mouth daily.    [provider]  diphenhydrAMINE (BENADRYL) 25 MG tablet Take 25 mg by mouth every 6 (six) hours as needed.    [provider]  docusate sodium (COLACE) 100 MG capsule Take 100-200 mg by mouth 2 (two) times daily.     [provider]  DULoxetine (CYMBALTA) 60 MG capsule Take 60 mg by mouth daily.  02/02/16   [provider]  ferrous sulfate 325 (65 FE) MG tablet Take 325 mg by mouth daily with breakfast.     [provider]  gabapentin (NEURONTIN) 400 MG capsule Take 800 mg by mouth 2 (two) times daily.  02/09/16   [provider]  levothyroxine (SYNTHROID, LEVOTHROID) 100  MCG tablet Take 100 mcg by mouth daily.  10/04/17   [provider]  MAGNESIUM PO Take 250 mg by mouth daily.     [provider]  Multiple Vitamins-Minerals (PRESERVISION AREDS 2) CAPS Take 1 capsule by mouth 2 (two) times daily.    [provider]  omeprazole (PRILOSEC) 40 MG capsule Take 40 mg by mouth daily.  02/16/16   [provider]  potassium chloride (K-DUR,KLOR-CON) 10 MEQ tablet Take 10 mEq by mouth daily.     [provider]  traMADol (ULTRAM) 50 MG tablet Take 50 mg by mouth every 6 (six) hours as needed.    [provider]    Inpatient Medications:     Allergies:  Allergies  Allergen Reactions  . Acetaminophen-Codeine Other (See Comments)  . Aleve [Naproxen] Hives    Causes dizziness  . Penicillins Hives    Has patient had a PCN reaction causing immediate rash, facial/tongue/throat swelling, SOB or lightheadedness with hypotension: hives Has patient had a PCN reaction causing severe rash involving mucus membranes or skin necrosis: no Has patient had a PCN reaction that required hospitalization no Has patient had a PCN reaction occurring within the last 10 years: no If all of the above answers are "NO", then may proceed with Cephalosporin use.  . Sulfa Antibiotics Hives  . Codeine Hives, Itching and Other (See Comments)    dizziness    Social History   Socioeconomic History  . Marital status: Widowed    Spouse name: Not on file  . Number of children: Not on file  . Years of education: Not on file  . Highest education level: Not on file  Occupational History  . Not on file  Social Needs  . Financial resource strain: Not on file  . Food insecurity:    Worry: Not on file    Inability: Not on file  . Transportation needs:    Medical: Not on file    Non-medical: Not on file  Tobacco Use  . Smoking status: Never Smoker  . Smokeless tobacco: Never Used  Substance and Sexual Activity  . Alcohol use: No  . Drug  use: No  . Sexual activity: Not Currently    Birth control/protection: Post-menopausal  Lifestyle  . Physical activity:    Days per week: Not on file    Minutes per session: Not on file  . Stress: Not on file  Relationships  . Social connections:    Talks on phone: Not on file    Gets together: Not on file    Attends religious service: Not on file    Active member of club or organization: Not on file    Attends meetings of  clubs or organizations: Not on file    Relationship status: Not on file  . Intimate partner violence:    Fear of current or ex partner: Not on file    Emotionally abused: Not on file    Physically abused: Not on file    Forced sexual activity: Not on file  Other Topics Concern  . Not on file  Social History Narrative  . Not on file     Family History  Problem Relation Age of Onset  . Heart disease Father   . Heart attack Father   . Diabetes Paternal Uncle   . Cancer Neg Hx   . Breast cancer Neg Hx      Review of Systems: A 12-system review of systems was performed and is negative except as noted in the HPI.  Labs: No results for input(s): CKTOTAL, CKMB, TROPONINI in the last 72 hours. Lab Results  Component Value Date   WBC 8.2 11/30/2018   HGB 11.3 (L) 11/30/2018   HCT 36.2 11/30/2018   MCV 94.0 11/30/2018   PLT 271 11/30/2018   No results for input(s): NA, K, CL, CO2, BUN, CREATININE, CALCIUM, PROT, BILITOT, ALKPHOS, ALT, AST, GLUCOSE in the last 168 hours.  Invalid input(s): LABALBU No results found for: CHOL, HDL, LDLCALC, TRIG No results found for: DDIMER  Radiology/Studies:  Dg Chest 2 View  Result Date: 11/03/2018 CLINICAL DATA:  Dyspnea.  Hypoxia.  Syncope this morning. EXAM: CHEST - 2 VIEW COMPARISON:  Chest x-rays dated 12/28/2017 and 01/03/2015, PET-CT dated 01/17/2018 and chest CT dated 07/04/2017 FINDINGS: There are numerous bilateral pulmonary nodules which appears essentially unchanged since the PET-CT scan dated 08/07/2018.  The patient has numerous pulmonary nodules visible on the PET-CT there are not visible on chest x-ray. Heart size and vascularity are normal. Aortic atherosclerosis. No infiltrates or effusions. Fairly severe thoracolumbar scoliosis with convexity to the right. Small hiatal hernia. No acute bone abnormality. IMPRESSION: No acute abnormality. Numerous stable bilateral pulmonary nodules consistent with metastatic disease. Aortic Atherosclerosis (ICD10-I70.0). Electronically Signed   By: Lorriane Shire M.D.   On: 11/03/2018 14:01   Ct Head Wo Contrast  Result Date: 11/04/2018 CLINICAL DATA:  Syncope, simple, with normal neuro exam EXAM: CT HEAD WITHOUT CONTRAST TECHNIQUE: Contiguous axial images were obtained from the base of the skull through the vertex without intravenous contrast. COMPARISON:  Head CT 08/07/2018 FINDINGS: Brain: No acute infarct, hemorrhage, hydrocephalus, or collection. Age normal brain volume. Isodense mass in the left sella, left cavernous sinus region, left suprasellar cistern, and left Meckel's cave region. This causes mass effect on the inferior left cerebrum. Patient has history of pituitary tumor. Mild chronic small vessel ischemia in the cerebral white matter. Vascular: No hyperdense vessel or unexpected calcification. Skull: No acute or aggressive finding. Sinuses/Orbits: Changes of trans-sphenoidal surgery. Partial coverage of the sinuses shows extensive ethmoid and maxillary opacification with sclerotic wall thickening from chronic inflammation. IMPRESSION: 1. No acute finding. 2. Grossly similar appearance of sellar, suprasellar, and left cavernous sinus region mass when compared to a 2018 brain MRI. Patient has history of pituitary tumor. Electronically Signed   By: Monte Fantasia M.D.   On: 11/04/2018 11:15   Ct Angio Neck W Or Wo Contrast  Result Date: 11/21/2018 CLINICAL DATA:  Hypotension during a stress test. Near syncope. History of thyroid cancer. EXAM: CT ANGIOGRAPHY  NECK TECHNIQUE: Multidetector CT imaging of the neck was performed using the standard protocol during bolus administration of intravenous contrast. Multiplanar CT image  reconstructions and MIPs were obtained to evaluate the vascular anatomy. Carotid stenosis measurements (when applicable) are obtained utilizing NASCET criteria, using the distal internal carotid diameter as the denominator. CONTRAST:  47mL OMNIPAQUE IOHEXOL 350 MG/ML SOLN COMPARISON:  PET-CT 08/07/2018. Soft tissue neck CT 12/05/2013. FINDINGS: Aortic arch: Standard 3 vessel aortic arch with mild atherosclerotic plaque. Widely patent arch vessel origins. Soft plaque throughout the proximal left subclavian artery resulting in less than 50% stenosis. Right carotid system: Patent with mild plaque at the carotid bifurcation. No evidence of stenosis or dissection. Left carotid system: Patent with a focal, less than 50% stenosis of the mid common carotid artery. Predominantly calcified plaque at the carotid bifurcation not resulting in stenosis. No evidence of dissection. Vertebral arteries: Patent and codominant without evidence of flow limiting stenosis or dissection. Tortuous proximal left V1 segments with up to mild narrowing. Skeleton: Advanced disc and facet degeneration in the cervical and upper thoracic spine. Moderately advanced median C1-2 arthropathy. Other neck: Fatty atrophy of the submandibular and parotid glands. Status post thyroidectomy similar appearance of left lower neck lymph nodes compared to the prior PET-CT. 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 Upper chest: Mild biapical lung scarring. IMPRESSION: 1. Mild atherosclerosis without significant cervical carotid or vertebral artery stenosis or dissection. 2.  Aortic Atherosclerosis (ICD10-I70.0). Electronically Signed   By: Logan Bores M.D.   On: 11/21/2018 15:52   Ct Angio Chest Pe W And/or Wo Contrast  Result Date: 11/03/2018 CLINICAL  DATA:  Near syncope today. Feeling lightheaded. History of thyroid cancer with lung metastasis. EXAM: CT ANGIOGRAPHY CHEST WITH CONTRAST TECHNIQUE: Multidetector CT imaging of the chest was performed using the standard protocol during bolus administration of intravenous contrast. Multiplanar CT image reconstructions and MIPs were obtained to evaluate the vascular anatomy. CONTRAST:  75mL OMNIPAQUE IOHEXOL 350 MG/ML SOLN COMPARISON:  July 04, 2017 FINDINGS: Cardiovascular: Satisfactory opacification of the pulmonary arteries to the segmental level. No evidence of pulmonary embolism. Normal heart size. No pericardial effusion. Mediastinum/Nodes: No enlarged mediastinal, hilar, or axillary lymph nodes. Thyroid gland, trachea, and esophagus demonstrate no significant findings. Lungs/Pleura: There are multiple bilateral pulmonary nodules consistent with patient's known thyroid metastasis to the lungs. Which are viewed nodules are unchanged compared prior exam. However, the largest nodule in the left lower lobe is increased in size compared to prior exam of 2018 currently measuring 1.6 x 1.7 cm. There is no pleural effusion. Upper Abdomen: There is a cyst in the dome of the liver unchanged compared to prior exam. Moderate hiatal hernia is identified. Musculoskeletal: No definite focal discrete lytic or blastic lesions are identified. Degenerative joint changes of the spine. Review of the MIP images confirms the above findings. IMPRESSION: No pulmonary embolus. Multiple pulmonary nodules in both lungs consistent with known metastasis. Majority of the nodules are not significantly changed compared to prior exam. However, the largest nodule in the left lower lobe is increased in size currently measuring 1.6 x 1.7 cm. Electronically Signed   By: Abelardo Diesel M.D.   On: 11/03/2018 15:05   Mr Brain Wo Contrast  Result Date: 11/21/2018 CLINICAL DATA:  Acute headache EXAM: MRI HEAD WITHOUT CONTRAST TECHNIQUE: Multiplanar,  multiecho pulse sequences of the brain and surrounding structures were obtained without intravenous contrast. COMPARISON:  CTA neck 11/21/2018 Brain MRI 01/12/2017 FINDINGS: BRAIN: There is no acute infarct, acute hemorrhage, hydrocephalus or extra-axial collection. Intra sellar mass extending into the left cavernous sinus and along the left carotid  terminus is unchanged. Extension into the clivus is unchanged. The mass measures approximately 3.0 x 3.8 by 2.8 cm. There is magnetic susceptibility effect within the mass. Early confluent hyperintense T2-weighted signal of the periventricular and deep white matter, most commonly due to chronic ischemic microangiopathy. Generalized atrophy without lobar predilection. Susceptibility-sensitive sequences show no chronic microhemorrhage or superficial siderosis. VASCULAR: Major intracranial arterial and venous sinus flow voids are normal. SKULL AND UPPER CERVICAL SPINE: Calvarial bone marrow signal is normal. There is no skull base mass. Visualized upper cervical spine and soft tissues are normal. SINUSES/ORBITS: Moderate paranasal sinus mucosal thickening with fluid levels in both maxillary sinuses. The orbits are normal. IMPRESSION: 1. No acute intracranial abnormality. 2. Chronic ischemic microangiopathy. 3. 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 4. Moderate paranasal sinus mucosal thickening with bilateral maxillary sinus fluid levels. Correlate for symptoms of acute sinusitis. This could account for the patient's headache. 5. Assess stone for small parenchymal metastases limited without IV contrast. Electronically Signed   By: Ulyses Jarred M.D.   On: 11/21/2018 21:48   Dg Chest Port 1 View  Result Date: 11/30/2018 CLINICAL DATA:  Syncope EXAM: PORTABLE CHEST 1 VIEW COMPARISON:  11/21/2018 FINDINGS: Normal heart size and mediastinal contours. Known pulmonary  nodules. Chest CT was obtained 1 month ago. There is no edema, consolidation, effusion, or pneumothorax. IMPRESSION: 1. No evidence of acute disease. 2. Pulmonary nodules that are known from recent chest CT. Electronically Signed   By: Monte Fantasia M.D.   On: 11/30/2018 10:46   Dg Chest Port 1 View  Result Date: 11/21/2018 CLINICAL DATA:  Chest pain. EXAM: PORTABLE CHEST 1 VIEW COMPARISON:  11/03/2018. Chest CTA dated 11/03/2018 FINDINGS: Previously demonstrated nodule at the right lung base. Other previously demonstrated nodules are not visible. Minimal linear atelectasis at the left lung base. Clear right lung. Normal sized heart. Diffuse osteopenia IMPRESSION: 1. Minimal left basilar atelectasis. 2. Previously demonstrated right basilar nodule. Electronically Signed   By: Claudie Revering M.D.   On: 11/21/2018 13:25    Wt Readings from Last 3 Encounters:  11/30/18 51.3 kg  11/22/18 53.1 kg  11/19/18 52.9 kg    EKG: Sinus bradycardia with no ischemia  Physical Exam:  Blood pressure (!) 118/54, pulse (!) 51, temperature 98.1 F (36.7 C), temperature source Oral, resp. rate 14, height 5' (1.524 m), weight 51.3 kg, SpO2 100 %. Body mass index is 22.07 kg/m. General: Well developed, well nourished, in no acute distress. Head: Normocephalic, atraumatic, sclera non-icteric, no xanthomas, nares are without discharge.  Neck: Negative for carotid bruits. JVD not elevated. Lungs: Clear bilaterally to auscultation without wheezes, rales, or rhonchi. Breathing is unlabored. Heart: RRR with S1 S2. No murmurs, rubs, or gallops appreciated. Abdomen: Soft, non-tender, non-distended with normoactive bowel sounds. No hepatomegaly. No rebound/guarding. No obvious abdominal masses. Msk:  Strength and tone appear normal for age. Extremities: No clubbing or cyanosis. No edema.  Distal pedal pulses are 2+ and equal bilaterally. Neuro: Alert and oriented X 3. No facial asymmetry. No focal deficit. Moves all  extremities spontaneously. Psych:  Responds to questions appropriately with a normal affect.     Assessment and Plan  Patient with recurrent admissions and presentations for syncope.  She is chronically bradycardic with heart rates in the 40s to 50s.  She had another episode today occurred while upright.  She has very little warning.  She gets very lightheaded followed  by a syncopal episode.  Laboratories are unremarkable.  Given recurrent episodes of syncope with chronic bradycardia with no AV nodal meds, will proceed with recommendation for permanent pacemaker to be placed.  Will place orders for this for tomorrow.  N.p.o. after midnight.  Further recommendations after this is complete.  Risk and benefits of the procedure were explained to the patient and she agrees to proceed.  Ivin Booty MD 11/30/2018, 11:24 AM Pager: [phone]

## 2018-11-30 NOTE — Anesthesia Preprocedure Evaluation (Deleted)
Anesthesia Evaluation  Patient identified by MRN, date of birth, ID band  Airway        Dental   Pulmonary asthma ,           Cardiovascular hypertension, + Past MI       Neuro/Psych  Neuromuscular disease negative psych ROS   GI/Hepatic GERD  ,  Endo/Other  Hypothyroidism   Renal/GU      Musculoskeletal  (+) Arthritis ,   Abdominal   Peds  Hematology   Anesthesia Other Findings Past Medical History: No date: Arthritis No date: Asthma No date: Back pain 03/30/2015: Cancer of thyroid ([place]) No date: Cardiomyopathy ([place])     Comment:  idiopathic No date: Chest pain     Comment:  non cardiac No date: Chokes     Comment:  easily   swallow test negative No date: Constipation No date: DDD (degenerative disc disease), lumbar No date: Disc disorder No date: GERD (gastroesophageal reflux disease) No date: H/O wheezing     Comment:  advair as needed No date: Hypertension No date: Hypothyroidism No date: IBS (irritable bowel syndrome) No date: Neuropathy No date: Nocturia No date: RAD (reactive airway disease) No date: Rectocele No date: Seasonal allergies No date: Spinal stenosis No date: Urge incontinence No date: Vaginal atrophy No date: Vaginal polyp   Reproductive/Obstetrics                            Anesthesia Physical Anesthesia Plan  ASA: III  Anesthesia Plan: MAC   Post-op Pain Management:    Induction: Intravenous  PONV Risk Score and Plan:   Airway Management Planned: Nasal Cannula  Additional Equipment:   Intra-op Plan:   Post-operative Plan:   Informed Consent: I have reviewed the patients History and Physical, chart, labs and discussed the procedure including the risks, benefits and alternatives for the proposed anesthesia with the patient or authorized representative who has indicated his/her understanding and acceptance.     Dental advisory given  Plan  Discussed with: CRNA and Surgeon  Anesthesia Plan Comments: (Hx of syncope and bradycardia)        Anesthesia Quick Evaluation                                  Anesthesia Evaluation  Patient identified by MRN, date of birth, ID band Patient awake    Reviewed: Allergy & Precautions, NPO status , Patient's Chart, lab work & pertinent test results  Airway Mallampati: II       Dental  (+) Upper Dentures, Lower Dentures   Pulmonary neg pulmonary ROS,    breath sounds clear to auscultation       Cardiovascular Exercise Tolerance: Good hypertension, Pt. on medications  Rhythm:Regular     Neuro/Psych negative neurological ROS     GI/Hepatic Neg liver ROS, GERD  Medicated,  Endo/Other  Hypothyroidism   Renal/GU negative Renal ROS     Musculoskeletal   Abdominal Normal abdominal exam  (+)   Peds negative pediatric ROS (+)  Hematology   Anesthesia Other Findings   Reproductive/Obstetrics                             Anesthesia Physical Anesthesia Plan  ASA: III  Anesthesia Plan: MAC   Post-op Pain Management:    Induction: Intravenous  Airway Management  Planned: Natural Airway and Nasal Cannula  Additional Equipment:   Intra-op Plan:   Post-operative Plan:   Informed Consent: I have reviewed the patients History and Physical, chart, labs and discussed the procedure including the risks, benefits and alternatives for the proposed anesthesia with the patient or authorized representative who has indicated his/her understanding and acceptance.     Plan Discussed with: CRNA  Anesthesia Plan Comments:         Anesthesia Quick Evaluation                                   Anesthesia Evaluation  Patient identified by MRN, date of birth, ID band Patient awake    Reviewed: Allergy & Precautions, NPO status , Patient's Chart, lab work & pertinent test results  Airway Mallampati: II       Dental  (+)  Upper Dentures, Lower Dentures   Pulmonary neg pulmonary ROS,    breath sounds clear to auscultation       Cardiovascular Exercise Tolerance: Good hypertension, Pt. on medications  Rhythm:Regular     Neuro/Psych negative neurological ROS     GI/Hepatic Neg liver ROS, GERD  Medicated,  Endo/Other  Hypothyroidism   Renal/GU negative Renal ROS     Musculoskeletal   Abdominal Normal abdominal exam  (+)   Peds negative pediatric ROS (+)  Hematology   Anesthesia Other Findings   Reproductive/Obstetrics                             Anesthesia Physical Anesthesia Plan  ASA: III  Anesthesia Plan: MAC   Post-op Pain Management:    Induction: Intravenous  Airway Management Planned: Natural Airway and Nasal Cannula  Additional Equipment:   Intra-op Plan:   Post-operative Plan:   Informed Consent: I have reviewed the patients History and Physical, chart, labs and discussed the procedure including the risks, benefits and alternatives for the proposed anesthesia with the patient or authorized representative who has indicated his/her understanding and acceptance.     Plan Discussed with: CRNA  Anesthesia Plan Comments:         Anesthesia Quick Evaluation                                   Anesthesia Evaluation  Patient identified by MRN, date of birth, ID band Patient awake    Reviewed: Allergy & Precautions, NPO status , Patient's Chart, lab work & pertinent test results, reviewed documented beta blocker date and time   History of Anesthesia Complications (+) PONV  Airway Mallampati: II  TM Distance: <3 FB Neck ROM: Full    Dental no notable dental hx.    Pulmonary COPD Reactive airway disease breath sounds clear to auscultation  Pulmonary exam normal       Cardiovascular hypertension, Normal cardiovascular exam    Neuro/Psych  Neuromuscular disease    GI/Hepatic Neg liver ROS, GERD-  Medicated  and Controlled,  Endo/Other  Hypothyroidism Hx of Pituitary mass  Renal/GU negative Renal ROS     Musculoskeletal  (+) Arthritis -, Osteoarthritis,    Abdominal Normal abdominal exam  (+)   Peds  Hematology negative hematology ROS (+)   Anesthesia Other Findings   Reproductive/Obstetrics  Anesthesia Physical Anesthesia Plan  ASA: III  Anesthesia Plan: General   Post-op Pain Management:    Induction: Intravenous  Airway Management Planned:   Additional Equipment:   Intra-op Plan:   Post-operative Plan: Extubation in OR  Informed Consent: I have reviewed the patients History and Physical, chart, labs and discussed the procedure including the risks, benefits and alternatives for the proposed anesthesia with the patient or authorized representative who has indicated his/her understanding and acceptance.   Dental advisory given  Plan Discussed with: CRNA and Surgeon  Anesthesia Plan Comments:         Anesthesia Quick Evaluation

## 2018-11-30 NOTE — H&P (Signed)
[facility] [place] NAME: Laura Walters    MR#:  [id number]  DATE OF BIRTH:  05/12/1933  DATE OF ADMISSION:  11/30/2018  PRIMARY CARE PHYSICIAN: Idelle Crouch, MD   REQUESTING/REFERRING PHYSICIAN:   CHIEF COMPLAINT:   Chief Complaint  Patient presents with  . Loss of Consciousness    HISTORY OF PRESENT ILLNESS: Laura Walters  is a 85 y.o. female with a known history per below that includes bradycardia, recurrent episodes of syncope-followed by Dr. Eleonore Chiquito monitoring, presents to the emergency room via EMS with lightheadedness resulting in loss of consciousness, in the emergency room patient was found to be bradycardic and hypotensive initially, ER work-up was largely unimpressive, Dr. Fath/cardiology to see patient in the emergency room-recommended admission with plans for pacemaker placement, hospitalist asked to admit, patient is now been admitted for acute recurrent syncope suspected due to acute intermittent bradycardia.  PAST MEDICAL HISTORY:   Past Medical History:  Diagnosis Date  . Arthritis   . Asthma   . Back pain   . Cancer of thyroid ([place]) 03/30/2015  . Cardiomyopathy ([place])    idiopathic  . Chest pain    non cardiac  . Chokes    easily   swallow test negative  . Constipation   . DDD (degenerative disc disease), lumbar   . Disc disorder   . GERD (gastroesophageal reflux disease)   . H/O wheezing    advair as needed  . Hypertension   . Hypothyroidism   . IBS (irritable bowel syndrome)   . Neuropathy   . Nocturia   . RAD (reactive airway disease)   . Rectocele   . Seasonal allergies   . Spinal stenosis   . Urge incontinence   . Vaginal atrophy   . Vaginal polyp     PAST SURGICAL HISTORY:  Past Surgical History:  Procedure Laterality Date  . BREAST BIOPSY Right 86 and 92   2 bx-neg  . CATARACT EXTRACTION W/PHACO Right 05/19/2016   Procedure: CATARACT EXTRACTION PHACO AND INTRAOCULAR LENS PLACEMENT (IOC);  Surgeon:  Estill Cotta, MD;  Location: [facility] ORS;  Service: Ophthalmology;  Laterality: Right;  Lot# [id number] [place]:   1:25.3 AP%:  24.9% CDE:  40.11  . CATARACT EXTRACTION W/PHACO Left 08/11/2016   Procedure: CATARACT EXTRACTION PHACO AND INTRAOCULAR LENS PLACEMENT (IOC);  Surgeon: Estill Cotta, MD;  Location: [facility] ORS;  Service: Ophthalmology;  Laterality: Left;  [place] 1.24 AP% 26.1 CDE 37.79 FLUID PACK LOT # [id number]  . DILATION AND CURETTAGE OF UTERUS    . HEMORROIDECTOMY     x 2  . JOINT REPLACEMENT     left hip  . KNEE ARTHROPLASTY Right 01/05/2016   Procedure: COMPUTER ASSISTED TOTAL KNEE ARTHROPLASTY;  Surgeon: Dereck Leep, MD;  Location: [facility] ORS;  Service: Orthopedics;  Laterality: Right;  . KNEE ARTHROSCOPY Right 05/19/2015   Procedure: Right knee arthrosocpy medail and lateral menisectomy, chondroplasty ;  Surgeon: Dereck Leep, MD;  Location: [facility] ORS;  Service: Orthopedics;  Laterality: Right;  . Left total hip arthroplasty    . PAROTIDECTOMY    . PITUITARY SURGERY    . THYROID SURGERY     bx  . THYROIDECTOMY      SOCIAL HISTORY:  Social History   Tobacco Use  . Smoking status: Never Smoker  . Smokeless tobacco: Never Used  Substance Use Topics  . Alcohol use: No    FAMILY HISTORY:  Family History  Problem Relation  Age of Onset  . Heart disease Father   . Heart attack Father   . Diabetes Paternal Uncle   . Cancer Neg Hx   . Breast cancer Neg Hx     DRUG ALLERGIES:  Allergies  Allergen Reactions  . Acetaminophen-Codeine Other (See Comments)  . Aleve [Naproxen] Hives    Causes dizziness  . Penicillins Hives    Has patient had a PCN reaction causing immediate rash, facial/tongue/throat swelling, SOB or lightheadedness with hypotension: hives Has patient had a PCN reaction causing severe rash involving mucus membranes or skin necrosis: no Has patient had a PCN reaction that required hospitalization no Has patient had a PCN reaction occurring within the  last 10 years: no If all of the above answers are "NO", then may proceed with Cephalosporin use.  . Sulfa Antibiotics Hives  . Codeine Hives, Itching and Other (See Comments)    dizziness    REVIEW OF SYSTEMS:   CONSTITUTIONAL: No fever, fatigue or weakness.  Frail-appearing EYES: No blurred or double vision.  EARS, NOSE, AND THROAT: No tinnitus or ear pain.  RESPIRATORY: No cough, shortness of breath, wheezing or hemoptysis.  CARDIOVASCULAR: No chest pain, orthopnea, edema.  GASTROINTESTINAL: No nausea, vomiting, diarrhea or abdominal pain.  GENITOURINARY: No dysuria, hematuria.  ENDOCRINE: No polyuria, nocturia,  HEMATOLOGY: No anemia, easy bruising or bleeding SKIN: No rash or lesion. MUSCULOSKELETAL: No joint pain or arthritis.   NEUROLOGIC: No tingling, numbness, weakness.  PSYCHIATRY: No anxiety or depression.   MEDICATIONS AT HOME:  Prior to Admission medications   Medication Sig Start Date End Date Taking? Authorizing Provider  acetaminophen (TYLENOL) 325 MG tablet Take 650 mg by mouth daily.    [provider]  diphenhydrAMINE (BENADRYL) 25 MG tablet Take 25 mg by mouth every 6 (six) hours as needed.    [provider]  docusate sodium (COLACE) 100 MG capsule Take 100-200 mg by mouth 2 (two) times daily.     [provider]  DULoxetine (CYMBALTA) 60 MG capsule Take 60 mg by mouth daily.  02/02/16   [provider]  ferrous sulfate 325 (65 FE) MG tablet Take 325 mg by mouth daily with breakfast.     [provider]  gabapentin (NEURONTIN) 400 MG capsule Take 800 mg by mouth 2 (two) times daily.  02/09/16   [provider]  levothyroxine (SYNTHROID, LEVOTHROID) 100 MCG tablet Take 100 mcg by mouth daily.  10/04/17   [provider]  MAGNESIUM PO Take 250 mg by mouth daily.     [provider]  Multiple Vitamins-Minerals (PRESERVISION AREDS 2) CAPS Take 1 capsule by mouth 2 (two) times daily.    [provider]  omeprazole (PRILOSEC) 40 MG capsule Take 40 mg by mouth daily.  02/16/16   [provider]  potassium chloride (K-DUR,KLOR-CON) 10 MEQ tablet Take 10 mEq by mouth daily.     [provider]  traMADol (ULTRAM) 50 MG tablet Take 50 mg by mouth every 6 (six) hours as needed.    [provider]      PHYSICAL EXAMINATION:   VITAL SIGNS: Blood pressure 130/69, pulse (!) 53, temperature 98.1 F (36.7 C), temperature source Oral, resp. rate 16, height 5' (1.524 m), weight 51.3 kg, SpO2 100 %.  GENERAL:  85 y.o.-year-old patient lying in the bed with no acute distress.  EYES: Pupils equal, round, reactive to light and accommodation. No scleral icterus. Extraocular muscles intact.  HEENT: Head atraumatic, normocephalic. Oropharynx  and nasopharynx clear.  NECK:  Supple, no jugular venous distention. No thyroid enlargement, no tenderness.  LUNGS: Normal breath sounds bilaterally, no wheezing, rales,rhonchi or crepitation. No use of accessory muscles of respiration.  CARDIOVASCULAR: S1, S2 normal. No murmurs, rubs, or gallops.  ABDOMEN: Soft, nontender, nondistended. Bowel sounds present. No organomegaly or mass.  EXTREMITIES: No pedal edema, cyanosis, or clubbing.  NEUROLOGIC: Cranial nerves II through XII are intact. Muscle strength 5/5 in all extremities. Sensation intact. Gait not checked.  PSYCHIATRIC: The patient is alert and oriented x 3.  SKIN: No obvious rash, lesion, or ulcer.   LABORATORY PANEL:   CBC Recent Labs  Lab 11/30/18 1048  WBC 8.2  HGB 11.3*  HCT 36.2  PLT 271  MCV 94.0  MCH 29.4  MCHC 31.2  RDW 13.7   ------------------------------------------------------------------------------------------------------------------  Chemistries  Recent Labs  Lab 11/30/18 1048  NA 136  K 4.6  CL 104  CO2 23  GLUCOSE 107*  BUN 14  CREATININE 0.87  CALCIUM 8.5*    ------------------------------------------------------------------------------------------------------------------ estimated creatinine clearance is 35.2 mL/min (by C-G formula based on SCr of 0.87 mg/dL). ------------------------------------------------------------------------------------------------------------------ No results for input(s): TSH, T4TOTAL, T3FREE, THYROIDAB in the last 72 hours.  Invalid input(s): FREET3   Coagulation profile No results for input(s): INR, PROTIME in the last 168 hours. ------------------------------------------------------------------------------------------------------------------- No results for input(s): DDIMER in the last 72 hours. -------------------------------------------------------------------------------------------------------------------  Cardiac Enzymes Recent Labs  Lab 11/30/18 1048  TROPONINI <0.03   ------------------------------------------------------------------------------------------------------------------ Invalid input(s): POCBNP  ---------------------------------------------------------------------------------------------------------------  Urinalysis    Component Value Date/Time   COLORURINE YELLOW (A) 11/18/2018 1744   APPEARANCEUR CLEAR (A) 11/18/2018 1744   APPEARANCEUR Clear 01/08/2015 1357   LABSPEC 1.011 11/18/2018 1744   LABSPEC 1.008 01/08/2015 1357   PHURINE 7.0 11/18/2018 1744   GLUCOSEU NEGATIVE 11/18/2018 1744   GLUCOSEU Negative 01/08/2015 1357   HGBUR NEGATIVE 11/18/2018 1744   BILIRUBINUR NEGATIVE 11/18/2018 1744   BILIRUBINUR neg 04/06/2018 1040   BILIRUBINUR Negative 01/08/2015 [place] 11/18/2018 1744   PROTEINUR NEGATIVE 11/18/2018 1744   UROBILINOGEN 0.2 04/06/2018 1040   NITRITE NEGATIVE 11/18/2018 1744   LEUKOCYTESUR TRACE (A) 11/18/2018 1744   LEUKOCYTESUR Negative 01/08/2015 1357     RADIOLOGY: Dg Chest Port 1 View  Result Date: 11/30/2018 CLINICAL DATA:  Syncope  EXAM: PORTABLE CHEST 1 VIEW COMPARISON:  11/21/2018 FINDINGS: Normal heart size and mediastinal contours. Known pulmonary nodules. Chest CT was obtained 1 month ago. There is no edema, consolidation, effusion, or pneumothorax. IMPRESSION: 1. No evidence of acute disease. 2. Pulmonary nodules that are known from recent chest CT. Electronically Signed   By: Monte Fantasia M.D.   On: 11/30/2018 10:46    EKG: Orders placed or performed during the hospital encounter of 11/30/18  . ED EKG  . ED EKG    IMPRESSION AND PLAN: *Acute recurrent syncope Suspected due to acute recurrent intermittent bradycardia Admit to telemetry bed, Dr. Fath/cardiology planning for pacemaker placement on tomorrow, fall/aspiration precautions while in house  *Acute recurrent bradycardia Plan of care as stated above  *Chronic hypothyroidism, unspecified Stable Continue Synthroid  *Chronic pain syndrome Stable Continue home regiment  *Chronic asthma without exacerbation Breathing treatments PRN  *History of cardiomyopathy, ejection fraction 30% Stable on current regiment   All the records are reviewed and case discussed with ED provider. Management plans discussed with the patient, family and they are in agreement.  CODE STATUS:full Code Status History    Date Active Date Inactive Code Status  Order ID Comments User Context   11/21/2018 1905 11/22/2018 1837 Full Code [id number]  [name], MD Inpatient   11/03/2018 1849 11/04/2018 1804 DNR [id number]  [name], MD Inpatient   05/19/2015 1612 05/19/2015 2013 Full Code [id number]  Dereck Leep, MD Inpatient    Advance Directive Documentation     Most Recent Value  Type of Advance Directive  Living will  Pre-existing out of facility DNR order (yellow form or pink MOST form)  -  "MOST" Form in Place?  -       TOTAL TIME TAKING CARE OF THIS PATIENT: 40 minutes.    Avel Peace  M.D on 11/30/2018   Between 7am to 6pm - Pager -  [phone]  After 6pm go to [email or web address] - password [email or web address] [facility]  Office  [phone]  CC: Primary care physician; Idelle Crouch, MD   Note: This dictation was prepared with Dragon dictation along with smaller phrase technology. Any transcriptional errors that result from this process are unintentional.

## 2018-11-30 NOTE — Progress Notes (Signed)
Family Meeting Note  Advance Directive:yes  Today a meeting took place with the Patient.  Patient is able to participate   The following clinical team members were present during this meeting:MD  The following were discussed:Patient's diagnosis:syncope, bradycardia , Patient's progosis: Unable to determine and Goals for treatment: Full Code  Additional follow-up to be provided: prn  Time spent during discussion:20 minutes  Gorden Harms, MD

## 2018-11-30 NOTE — ED Triage Notes (Signed)
Patient arrived via EMS from home with syncope. EMS reports patient "wasn't breathing and didn't have a heartbeat" on arrival to patient's home, spontaneously returned without CPR. Patient has history of syncopal episodes. Patient alert on arrival.

## 2018-11-30 NOTE — Progress Notes (Signed)
Patient scored level 2 on Respiratory Protocol Assessment, frequency changed from Q6 to PRN. No chronic history, normal chest xray, SAT on Room Air 94%, Clear bilateral breath sounds, no home medications. Patient resting comfortably in bed, advised patient to call if she developes shortness of breath or labored breathing.

## 2018-11-30 NOTE — Plan of Care (Signed)
  Problem: Education: Goal: Knowledge of General Education information will improve Description: Including pain rating scale, medication(s)/side effects and non-pharmacologic comfort measures Outcome: Progressing   Problem: Nutrition: Goal: Adequate nutrition will be maintained Outcome: Progressing   

## 2018-11-30 NOTE — ED Notes (Signed)
ED TO INPATIENT HANDOFF REPORT  ED Nurse Name and Phone #: [place] Levels Name/Age/Gender Laura Walters 85 y.o. female Room/Bed: ED12A/ED12A  Code Status   Code Status: Prior  Home/SNF/Other Home Patient oriented to: self, place, time and situation Is this baseline? Yes   Triage Complete: Triage complete  Chief Complaint syncope  Triage Note Patient arrived via EMS from home with syncope. EMS reports patient "wasn't breathing and didn't have a heartbeat" on arrival to patient's home, spontaneously returned without CPR. Patient has history of syncopal episodes. Patient alert on arrival.   Allergies Allergies  Allergen Reactions  . Acetaminophen-Codeine Other (See Comments)  . Aleve [Naproxen] Hives    Causes dizziness  . Penicillins Hives    Has patient had a PCN reaction causing immediate rash, facial/tongue/throat swelling, SOB or lightheadedness with hypotension: hives Has patient had a PCN reaction causing severe rash involving mucus membranes or skin necrosis: no Has patient had a PCN reaction that required hospitalization no Has patient had a PCN reaction occurring within the last 10 years: no If all of the above answers are "NO", then may proceed with Cephalosporin use.  . Sulfa Antibiotics Hives  . Codeine Hives, Itching and Other (See Comments)    dizziness    Level of Care/Admitting Diagnosis ED Disposition    ED Disposition Condition [place]: [place] [[id number]]  Level of Care: Telemetry [5]  Diagnosis: Bradycardia, unspecified [[id number]]  Admitting Physician: Gorden Harms [[id number]]  Attending Physician: Gorden Harms [[id number]]  Estimated length of stay: past midnight tomorrow  Certification:: I certify this patient will need inpatient services for at least 2 midnights  PT Class (Do Not Modify): Inpatient [101]  PT Acc Code (Do Not Modify): Private [1]       B Medical/Surgery History Past Medical  History:  Diagnosis Date  . Arthritis   . Asthma   . Back pain   . Cancer of thyroid ([place]) 03/30/2015  . Cardiomyopathy ([place])    idiopathic  . Chest pain    non cardiac  . Chokes    easily   swallow test negative  . Constipation   . DDD (degenerative disc disease), lumbar   . Disc disorder   . GERD (gastroesophageal reflux disease)   . H/O wheezing    advair as needed  . Hypertension   . Hypothyroidism   . IBS (irritable bowel syndrome)   . Neuropathy   . Nocturia   . RAD (reactive airway disease)   . Rectocele   . Seasonal allergies   . Spinal stenosis   . Urge incontinence   . Vaginal atrophy   . Vaginal polyp    Past Surgical History:  Procedure Laterality Date  . BREAST BIOPSY Right 86 and 92   2 bx-neg  . CATARACT EXTRACTION W/PHACO Right 05/19/2016   Procedure: CATARACT EXTRACTION PHACO AND INTRAOCULAR LENS PLACEMENT (IOC);  Surgeon: Estill Cotta, MD;  Location: [facility] ORS;  Service: Ophthalmology;  Laterality: Right;  Lot# [id number] [place]:   1:25.3 AP%:  24.9% CDE:  40.11  . CATARACT EXTRACTION W/PHACO Left 08/11/2016   Procedure: CATARACT EXTRACTION PHACO AND INTRAOCULAR LENS PLACEMENT (IOC);  Surgeon: Estill Cotta, MD;  Location: [facility] ORS;  Service: Ophthalmology;  Laterality: Left;  [place] 1.24 AP% 26.1 CDE 37.79 FLUID PACK LOT # [id number]  . DILATION AND CURETTAGE OF UTERUS    . HEMORROIDECTOMY     x 2  . JOINT REPLACEMENT  left hip  . KNEE ARTHROPLASTY Right 01/05/2016   Procedure: COMPUTER ASSISTED TOTAL KNEE ARTHROPLASTY;  Surgeon: Dereck Leep, MD;  Location: [facility] ORS;  Service: Orthopedics;  Laterality: Right;  . KNEE ARTHROSCOPY Right 05/19/2015   Procedure: Right knee arthrosocpy medail and lateral menisectomy, chondroplasty ;  Surgeon: Dereck Leep, MD;  Location: [facility] ORS;  Service: Orthopedics;  Laterality: Right;  . Left total hip arthroplasty    . PAROTIDECTOMY    . PITUITARY SURGERY    . THYROID SURGERY     bx  . THYROIDECTOMY        A IV Location/Drains/Wounds Patient Lines/Drains/Airways Status   Active Line/Drains/Airways    Name:   Placement date:   Placement time:   Site:   Days:   Peripheral IV 11/30/18 Left Antecubital   11/30/18    -    Antecubital   less than 1          Intake/Output Last 24 hours No intake or output data in the 24 hours ending 11/30/18 1448  Labs/Imaging Results for orders placed or performed during the hospital encounter of 11/30/18 (from the past 48 hour(s))  Basic metabolic panel     Status: Abnormal   Collection Time: 11/30/18 10:48 AM  Result Value Ref Range   Sodium 136 135 - 145 mmol/L   Potassium 4.6 3.5 - 5.1 mmol/L    Comment: HEMOLYSIS AT THIS LEVEL MAY AFFECT RESULT   Chloride 104 98 - 111 mmol/L   CO2 23 22 - 32 mmol/L   Glucose, Bld 107 (H) 70 - 99 mg/dL   BUN 14 8 - 23 mg/dL   Creatinine, Ser 0.87 0.44 - 1.00 mg/dL   Calcium 8.5 (L) 8.9 - 10.3 mg/dL   GFR calc non Af Amer >60 >60 mL/min   GFR calc Af Amer >60 >60 mL/min   Anion gap 9 5 - 15    Comment: Performed at [facility], [address] [place], [place] [address]  CBC     Status: Abnormal   Collection Time: 11/30/18 10:48 AM  Result Value Ref Range   WBC 8.2 4.0 - 10.5 K/uL   RBC 3.85 (L) 3.87 - 5.11 MIL/uL   Hemoglobin 11.3 (L) 12.0 - 15.0 g/dL   HCT 36.2 36.0 - 46.0 %   MCV 94.0 80.0 - 100.0 fL   MCH 29.4 26.0 - 34.0 pg   MCHC 31.2 30.0 - 36.0 g/dL   RDW 13.7 11.5 - 15.5 %   Platelets 271 150 - 400 K/uL   nRBC 0.0 0.0 - 0.2 %    Comment: Performed at [facility], [address] [place], [place] [address]  Troponin I - Once     Status: None   Collection Time: 11/30/18 10:48 AM  Result Value Ref Range   Troponin I <0.03 <0.03 ng/mL    Comment: Performed at [facility], [address] [place], [place] [address]  Brain natriuretic peptide     Status: None   Collection Time: 11/30/18 10:48 AM  Result Value Ref Range   B Natriuretic Peptide 47.0 0.0 - 100.0  pg/mL    Comment: Performed at [facility], [address] [place], [place] [address]   Dg Chest Port 1 View  Result Date: 11/30/2018 CLINICAL DATA:  Syncope EXAM: PORTABLE CHEST 1 VIEW COMPARISON:  11/21/2018 FINDINGS: Normal heart size and mediastinal contours. Known pulmonary nodules. Chest CT was obtained 1 month ago. There is no edema,  consolidation, effusion, or pneumothorax. IMPRESSION: 1. No evidence of acute disease. 2. Pulmonary nodules that are known from recent chest CT. Electronically Signed   By: Monte Fantasia M.D.   On: 11/30/2018 10:46    Pending Labs Unresulted Labs (From admission, onward)    Start     Ordered   11/30/18 1120  Urinalysis, Complete w Microscopic  Once,   STAT     11/30/18 1119   11/30/18 1120  Cortisol  Once,   STAT     11/30/18 1119          Vitals/Pain Today's Vitals   11/30/18 1130 11/30/18 1145 11/30/18 1200 11/30/18 1230  BP: (!) 94/57 114/69 130/69 (!) 156/76  Pulse: 61  (!) 53 (!) 52  Resp: 14 16 16  (!) 9  Temp:      TempSrc:      SpO2: 100%  100% 98%  Weight:      Height:      PainSc:        Isolation Precautions No active isolations  Medications Medications  ondansetron (ZOFRAN) injection 4 mg (4 mg Intravenous Given 11/30/18 1050)  sodium chloride 0.9 % bolus 500 mL (0 mLs Intravenous Stopped 11/30/18 1214)    Mobility walks with person assist High fall risk   Focused Assessments Cardiac Assessment Handoff:  Cardiac Rhythm: Sinus bradycardia Lab Results  Component Value Date   TROPONINI <0.03 11/30/2018   No results found for: DDIMER Does the Patient currently have chest pain? No     R Recommendations: See Admitting Provider Note  Report given to:   Additional Notes: admitted for pacemaker

## 2018-12-01 ENCOUNTER — Inpatient Hospital Stay: Payer: Medicare Other

## 2018-12-01 ENCOUNTER — Encounter: Payer: Self-pay | Admitting: *Deleted

## 2018-12-01 ENCOUNTER — Encounter
Admission: EM | Disposition: A | Discharge status: Home / Self Care | Payer: Self-pay | Source: Home / Self Care | Attending: Internal Medicine

## 2018-12-01 ENCOUNTER — Inpatient Hospital Stay: Payer: Medicare Other | Admitting: Anesthesiology

## 2018-12-01 HISTORY — PX: PACEMAKER INSERTION: SHX728

## 2018-12-01 LAB — BASIC METABOLIC PANEL
Anion gap: 9 (ref 5–15)
BUN: 11 mg/dL (ref 8–23)
CALCIUM: 7.9 mg/dL — AB (ref 8.9–10.3)
CO2: 22 mmol/L (ref 22–32)
Chloride: 104 mmol/L (ref 98–111)
Creatinine, Ser: 0.71 mg/dL (ref 0.44–1.00)
GFR calc Af Amer: >60 mL/min (ref >60)
GFR calc non Af Amer: >60 mL/min (ref >60)
Glucose, Bld: 146 mg/dL — ABNORMAL HIGH (ref 70–99)
Potassium: 4 mmol/L (ref 3.5–5.1)
Sodium: 135 mmol/L (ref 135–145)

## 2018-12-01 LAB — CBC
HCT: 34.2 % — ABNORMAL LOW (ref 36.0–46.0)
Hemoglobin: 11 g/dL — ABNORMAL LOW (ref 12.0–15.0)
MCH: 30 pg (ref 26.0–34.0)
MCHC: 32.2 g/dL (ref 30.0–36.0)
MCV: 93.2 fL (ref 80.0–100.0)
Platelets: 247 10*3/uL (ref 150–400)
RBC: 3.67 MIL/uL — ABNORMAL LOW (ref 3.87–5.11)
RDW: 13.5 % (ref 11.5–15.5)
WBC: 8.2 10*3/uL (ref 4.0–10.5)
nRBC: 0 % (ref 0.0–0.2)

## 2018-12-01 SURGERY — INSERTION, CARDIAC PACEMAKER
Anesthesia: Monitor Anesthesia Care | Site: Chest | Laterality: Left

## 2018-12-01 SURGERY — INSERTION, CARDIAC PACEMAKER
Anesthesia: Choice

## 2018-12-01 MED ORDER — ONDANSETRON HCL 4 MG/2ML IJ SOLN
4.0000 mg | Freq: Four times a day (QID) | INTRAMUSCULAR | Status: DC | PRN
  Administered 2018-12-02: 4 mg via INTRAVENOUS
  Filled 2018-12-01: qty 2

## 2018-12-01 MED ORDER — PROPOFOL 500 MG/50ML IV EMUL
INTRAVENOUS | Status: AC
  Filled 2018-12-01: qty 50

## 2018-12-01 MED ORDER — LIDOCAINE HCL (CARDIAC) PF 100 MG/5ML IV SOSY
PREFILLED_SYRINGE | INTRAVENOUS | Status: DC | PRN
  Administered 2018-12-01: 60 mg via INTRAVENOUS

## 2018-12-01 MED ORDER — FENTANYL CITRATE (PF) 100 MCG/2ML IJ SOLN: 25.0000 ug | INTRAMUSCULAR | Status: DC | PRN

## 2018-12-01 MED ORDER — GABAPENTIN 400 MG PO CAPS
400.0000 mg | ORAL_CAPSULE | Freq: Two times a day (BID) | ORAL | Status: DC
  Administered 2018-12-02: 400 mg via ORAL
  Filled 2018-12-01 (×2): qty 1

## 2018-12-01 MED ORDER — LIDOCAINE 1 % OPTIME INJ - NO CHARGE
INTRAMUSCULAR | Status: DC | PRN
  Administered 2018-12-01: 30 mL

## 2018-12-01 MED ORDER — VANCOMYCIN HCL IN DEXTROSE 1-5 GM/200ML-% IV SOLN
1000.0000 mg | Freq: Two times a day (BID) | INTRAVENOUS | Status: AC
  Administered 2018-12-01: 1000 mg via INTRAVENOUS
  Filled 2018-12-01: qty 200

## 2018-12-01 MED ORDER — GENTAMICIN SULFATE 40 MG/ML IJ SOLN
INTRAMUSCULAR | Status: AC
  Filled 2018-12-01: qty 2

## 2018-12-01 MED ORDER — PROPOFOL 10 MG/ML IV BOLUS
INTRAVENOUS | Status: DC | PRN
  Administered 2018-12-01: 26 mg via INTRAVENOUS

## 2018-12-01 MED ORDER — PROPOFOL 500 MG/50ML IV EMUL
INTRAVENOUS | Status: DC | PRN
  Administered 2018-12-01: 100 ug/kg/min via INTRAVENOUS

## 2018-12-01 MED ORDER — LIDOCAINE HCL (PF) 2 % IJ SOLN
INTRAMUSCULAR | Status: AC
  Filled 2018-12-01: qty 10

## 2018-12-01 MED ORDER — ONDANSETRON HCL 4 MG/2ML IJ SOLN: 4.0000 mg | Freq: Once | INTRAMUSCULAR | Status: DC | PRN

## 2018-12-01 MED ORDER — SODIUM CHLORIDE 0.9 % IV SOLN
INTRAVENOUS | Status: DC | PRN
  Administered 2018-12-01: 150 mL

## 2018-12-01 SURGICAL SUPPLY — 38 items
BAG DECANTER FOR FLEXI CONT (MISCELLANEOUS) ×2 IMPLANT
BRUSH SCRUB EZ  4% CHG (MISCELLANEOUS) ×1
BRUSH SCRUB EZ 4% CHG (MISCELLANEOUS) ×1 IMPLANT
CABLE SURG 12 DISP A/V CHANNEL (MISCELLANEOUS) ×4 IMPLANT
CANISTER SUCT 1200ML W/VALVE (MISCELLANEOUS) ×2 IMPLANT
CHLORAPREP W/TINT 26ML (MISCELLANEOUS) ×2 IMPLANT
COVER LIGHT HANDLE STERIS (MISCELLANEOUS) ×4 IMPLANT
COVER MAYO STAND STRL (DRAPES) ×2 IMPLANT
COVER WAND RF STERILE (DRAPES) ×2 IMPLANT
DRAPE C-ARM XRAY 36X54 (DRAPES) ×2 IMPLANT
DRSG TEGADERM 4X4.75 (GAUZE/BANDAGES/DRESSINGS) ×2 IMPLANT
DRSG TELFA 4X3 1S NADH ST (GAUZE/BANDAGES/DRESSINGS) ×2 IMPLANT
ELECT REM PT RETURN 9FT ADLT (ELECTROSURGICAL) ×2
ELECTRODE REM PT RTRN 9FT ADLT (ELECTROSURGICAL) ×1 IMPLANT
GLOVE BIO SURGEON STRL SZ7.5 (GLOVE) ×2 IMPLANT
GLOVE BIO SURGEON STRL SZ8 (GLOVE) ×2 IMPLANT
GOWN STRL REUS W/ TWL LRG LVL3 (GOWN DISPOSABLE) ×1 IMPLANT
GOWN STRL REUS W/ TWL XL LVL3 (GOWN DISPOSABLE) ×1 IMPLANT
GOWN STRL REUS W/TWL LRG LVL3 (GOWN DISPOSABLE) ×1
GOWN STRL REUS W/TWL XL LVL3 (GOWN DISPOSABLE) ×1
IMMOBILIZER SHDR MD LX WHT (SOFTGOODS) ×2 IMPLANT
IMMOBILIZER SHDR XL LX WHT (SOFTGOODS) IMPLANT
INTRO PACEMAKR LEAD 9FR 13CM (INTRODUCER) ×2
INTRO PACEMKR SHEATH II 7FR (MISCELLANEOUS) ×4
INTRODUCER PACEMKR LD 9FR 13CM (INTRODUCER) ×1 IMPLANT
INTRODUCER PACEMKR SHTH II 7FR (MISCELLANEOUS) ×2 IMPLANT
IPG PACE AZUR XT DR MRI W1DR01 (Pacemaker) ×1 IMPLANT
IV NS 500ML (IV SOLUTION) ×1
IV NS 500ML BAXH (IV SOLUTION) ×1 IMPLANT
KIT TURNOVER KIT A (KITS) ×2 IMPLANT
LABEL OR SOLS (LABEL) ×2 IMPLANT
LEAD CAPSURE NOVUS 45CM (Lead) ×2 IMPLANT
LEAD CAPSURE NOVUS 5076-52CM (Lead) ×2 IMPLANT
MARKER SKIN DUAL TIP RULER LAB (MISCELLANEOUS) ×2 IMPLANT
PACE AZURE XT DR MRI W1DR01 (Pacemaker) ×2 IMPLANT
PACK PACE INSERTION (MISCELLANEOUS) ×2 IMPLANT
PAD ONESTEP ZOLL R SERIES ADT (MISCELLANEOUS) ×2 IMPLANT
SUT SILK 0 SH 30 (SUTURE) ×6 IMPLANT

## 2018-12-01 NOTE — Progress Notes (Signed)
Pt returns from pacemaker placement. Left chest dressing has small dot of blood which is outlined. Pt has an immobilizer on the left arm. No complaints of pain, VSS. Pt is pacing on demand.

## 2018-12-01 NOTE — Anesthesia Post-op Follow-up Note (Signed)
Anesthesia QCDR form completed.        

## 2018-12-01 NOTE — Anesthesia Postprocedure Evaluation (Signed)
Anesthesia Post Note  Patient: Lexus A Popelka  Procedure(s) Performed: INSERTION PACEMAKER Dual Chamber (Left Chest)  Patient location during evaluation: PACU Anesthesia Type: MAC Level of consciousness: awake and alert Pain management: pain level controlled Vital Signs Assessment: post-procedure vital signs reviewed and stable Respiratory status: spontaneous breathing, nonlabored ventilation, respiratory function stable and patient connected to nasal cannula oxygen Cardiovascular status: stable and blood pressure returned to baseline Postop Assessment: no apparent nausea or vomiting Anesthetic complications: no     Last Vitals:  Vitals:   12/01/18 1347 12/01/18 1402  BP: (!) 145/77 (!) 148/65  Pulse: 64 61  Resp: 13 15  Temp:  37.2 C  SpO2: 96% 97%    Last Pain:  Vitals:   12/01/18 1402  TempSrc:   PainSc: 0-No pain                 [place] Gopal S

## 2018-12-01 NOTE — Anesthesia Preprocedure Evaluation (Addendum)
Anesthesia Evaluation  Patient identified by MRN, date of birth, ID band Patient awake    Reviewed: Allergy & Precautions, NPO status , Patient's Chart, lab work & pertinent test results, reviewed documented beta blocker date and time   History of Anesthesia Complications (+) PONV  Airway Mallampati: II  TM Distance: <3 FB Neck ROM: Full    Dental no notable dental hx. (+) Chipped, Upper Dentures, Partial Lower   Pulmonary asthma , COPD,  Reactive airway disease   Pulmonary exam normal breath sounds clear to auscultation       Cardiovascular hypertension, Pt. on medications + Past MI and +CHF  Normal cardiovascular exam     Neuro/Psych  Neuromuscular disease negative psych ROS   GI/Hepatic Neg liver ROS, GERD  Medicated and Controlled,  Endo/Other  Hypothyroidism Hx of Pituitary mass  Renal/GU negative Renal ROS  negative genitourinary   Musculoskeletal  (+) Arthritis , Osteoarthritis,    Abdominal Normal abdominal exam  (+)   Peds  Hematology negative hematology ROS (+)   Anesthesia Other Findings Past Medical History: No date: Arthritis No date: Asthma No date: Back pain 03/30/2015: Cancer of thyroid ([facility]) No date: Cardiomyopathy ([place])     Comment:  idiopathic No date: Chest pain     Comment:  non cardiac  EF 55-60. CXR ok. EKG short Pr, but no delta waves seen. No date: Chokes     Comment:  easily   swallow test negative No date: Constipation No date: DDD (degenerative disc disease), lumbar No date: Disc disorder No date: GERD (gastroesophageal reflux disease) No date: H/O wheezing     Comment:  advair as needed No date: Hypertension No date: Hypothyroidism No date: IBS (irritable bowel syndrome) No date: Neuropathy No date: Nocturia No date: RAD (reactive airway disease) No date: Rectocele No date: Seasonal allergies No date: Spinal stenosis No date: Urge incontinence No date: Vaginal  atrophy No date: Vaginal polyp  Reproductive/Obstetrics                           Anesthesia Physical  Anesthesia Plan  ASA: III  Anesthesia Plan: MAC   Post-op Pain Management:    Induction: Intravenous  PONV Risk Score and Plan:   Airway Management Planned: Nasal Cannula  Additional Equipment:   Intra-op Plan:   Post-operative Plan:   Informed Consent: I have reviewed the patients History and Physical, chart, labs and discussed the procedure including the risks, benefits and alternatives for the proposed anesthesia with the patient or authorized representative who has indicated his/her understanding and acceptance.     Dental advisory given  Plan Discussed with: CRNA and Surgeon  Anesthesia Plan Comments:         Anesthesia Quick Evaluation

## 2018-12-01 NOTE — Progress Notes (Signed)
[facility] [facility] [place] NAME: Laura Walters    MR#:  [id number]  DATE OF BIRTH:  04/17/1933  SUBJECTIVE:  CHIEF COMPLAINT:   Chief Complaint  Patient presents with  . Loss of Consciousness   Seen prior to pacemaker insertion by Dr. Saralyn Pilar resting comfortably in bed with family at bedside.  No chest pain, presyncope, palpitations. Per RN notes after procedure, no complaints of pain vital signs stable pacing on demand.  REVIEW OF SYSTEMS:  Review of Systems  Constitutional: Negative for chills, diaphoresis, fever and malaise/fatigue.  HENT: Negative for congestion, ear pain, hearing loss and sore throat.   Eyes: Negative for blurred vision and double vision.  Respiratory: Negative for cough and shortness of breath.   Cardiovascular: Negative for chest pain, palpitations, orthopnea and leg swelling.  Gastrointestinal: Negative for abdominal pain, heartburn, nausea and vomiting.  Genitourinary: Negative for dysuria, frequency and urgency.  Musculoskeletal: Negative for joint pain and myalgias.  Skin: Negative for itching and rash.  Neurological: Negative for dizziness, focal weakness, weakness and headaches.  Psychiatric/Behavioral: Negative for depression. The patient is not nervous/anxious.   All other systems reviewed and are negative.  DRUG ALLERGIES:   Allergies  Allergen Reactions  . Acetaminophen-Codeine Other (See Comments)  . Aleve [Naproxen] Hives    Causes dizziness  . Penicillins Hives    Has patient had a PCN reaction causing immediate rash, facial/tongue/throat swelling, SOB or lightheadedness with hypotension: hives Has patient had a PCN reaction causing severe rash involving mucus membranes or skin necrosis: no Has patient had a PCN reaction that required hospitalization no Has patient had a PCN reaction occurring within the last 10 years: no If all of the above answers are "NO", then may proceed with Cephalosporin use.    . Sulfa Antibiotics Hives  . Codeine Hives, Itching and Other (See Comments)    dizziness   VITALS:  Blood pressure 132/63, pulse 63, temperature 98.4 F (36.9 C), temperature source Oral, resp. rate 19, height 5' (1.524 m), weight 52.9 kg, SpO2 98 %. PHYSICAL EXAMINATION:  GENERAL:  85 y.o.-year-old patient lying in the bed with no acute distress.  EYES: Pupils equal, round, reactive to light and accommodation. No scleral icterus. Extraocular muscles intact.  HEENT: Head atraumatic, normocephalic. Oropharynx and nasopharynx clear.  NECK:  Supple, no jugular venous distention. No thyroid enlargement, no tenderness.  LUNGS: Normal breath sounds bilaterally, no wheezing, rales,rhonchi or crepitation. No use of accessory muscles of respiration.  CARDIOVASCULAR: S1, S2 normal. No murmurs, rubs, or gallops.  ABDOMEN: Soft, nontender, nondistended. Bowel sounds present. No organomegaly or mass.  EXTREMITIES: No pedal edema, cyanosis, or clubbing.  NEUROLOGIC: Cranial nerves II through XII are intact. Muscle strength 5/5 in all extremities. Sensation intact. Gait not checked.  PSYCHIATRIC: The patient is alert and oriented x 3.  SKIN: No obvious rash, lesion, or ulcer.  LABORATORY PANEL:  Female CBC Recent Labs  Lab 11/30/18 1048  WBC 8.2  HGB 11.3*  HCT 36.2  PLT 271   ------------------------------------------------------------------------------------------------------------------ Chemistries  Recent Labs  Lab 11/30/18 1048  NA 136  K 4.6  CL 104  CO2 23  GLUCOSE 107*  BUN 14  CREATININE 0.87  CALCIUM 8.5*   RADIOLOGY:  Dg Chest 2 View  Result Date: 11/30/2018 CLINICAL DATA:  Syncope EXAM: CHEST - 2 VIEW COMPARISON:  11/30/2018, 11/21/2018, CT 11/03/2018 FINDINGS: No acute opacity or pleural effusion. Scarring or atelectasis at the left base. Stable cardiomediastinal  silhouette with aortic atherosclerosis. No pneumothorax. Nodule projecting over left cardiac border. Small  right basilar lung nodule. IMPRESSION: No active cardiopulmonary disease. Bilateral lung nodules, previously imaged on CT. Electronically Signed   By: Donavan Foil M.D.   On: 11/30/2018 19:08   ASSESSMENT AND PLAN:   Laura Walters  is a 85 y.o. female with a known history of bradycardia, recurrent episodes of syncope-followed by Dr. Eleonore Chiquito monitoring, presented to the emergency room via EMS with lightheadedness resulting in loss of consciousness, in the emergency room patient was found to be bradycardic and hypotensive initially, ER work-up was largely unimpressive, Dr. Fath/cardiology saw patient in the emergency room-recommended admission with plans for pacemaker placement which was done 12/01/2018.  Plan  *Acute recurrent syncope Suspected due to acute recurrent intermittent bradycardia Admit to telemetry bed, Dr. Saralyn Pilar performed a pacemaker placement 12/01/2018, fall/aspiration precautions while in house.  *Acute recurrent bradycardia Pacemaker as above.  *Chronic hypothyroidism, unspecified Continue Synthroid. TSH last checked 13 days ago was low with normal T4.   *Chronic pain syndrome Stable Continue home regimen  *Chronic asthma without exacerbation Breathing treatments PRN  *History of cardiomyopathy, ejection fraction 30% Stable on current regimen per admission note. No cardiac home meds seen on MAR.   *Depression Continue home regimen   *iron deficiency anemia  Hgb 11.3. Continue iron. Morning labs ordered.  All the records are reviewed and case is discussed with Care Management/Social Worker. Management plans discussed with the patient and/or family and they are in agreement.  CODE STATUS: Full Code  TOTAL TIME TAKING CARE OF THIS PATIENT: 30 minutes.   More than 50% of the time was spent in counseling/coordination of care: YES  POSSIBLE D/C IN 1 DAYS, DEPENDING ON CLINICAL CONDITION.   Myrella Fahs PA-C on 12/01/2018 at 11:23 AM  Between 7am to 6pm - Pager  - [phone]  After 6 pm go to [email or web address] - Proofreader  [facility]  Office  [phone]  CC: Primary care physician; Idelle Crouch, MD  Note: This dictation was prepared with Dragon dictation along with smaller phrase technology. Any transcriptional errors that result from this process are unintentional.

## 2018-12-01 NOTE — Plan of Care (Signed)
  Problem: Activity: Goal: Risk for activity intolerance will decrease Outcome: Progressing Note:  Up to bathroom with standby assist   Problem: Nutrition: Goal: Adequate nutrition will be maintained Outcome: Progressing   Problem: Coping: Goal: Level of anxiety will decrease Outcome: Progressing   Problem: Elimination: Goal: Will not experience complications related to urinary retention Outcome: Progressing   Problem: Pain Managment: Goal: General experience of comfort will improve Outcome: Progressing Note:  No complaints of pain this shift    Problem: Safety: Goal: Ability to remain free from injury will improve Outcome: Progressing   Problem: Skin Integrity: Goal: Risk for impaired skin integrity will decrease Outcome: Progressing   Problem: Education: Goal: Knowledge of General Education information will improve Description Including pain rating scale, medication(s)/side effects and non-pharmacologic comfort measures Outcome: Completed/Met

## 2018-12-01 NOTE — Progress Notes (Signed)
Transport to OR

## 2018-12-01 NOTE — Op Note (Signed)
[facility] Cardiology   12/01/2018                     1:29 PM  PATIENT:  Laura Walters    PRE-OPERATIVE DIAGNOSIS:  syncope  POST-OPERATIVE DIAGNOSIS:  Same  PROCEDURE:  INSERTION PACEMAKER Dual Chamber  SURGEON:  Isaias Cowman, MD    ANESTHESIA:     PREOPERATIVE INDICATIONS:  Laura Walters is a  85 y.o. female with a diagnosis of syncope who failed conservative measures and elected for surgical management.    The risks benefits and alternatives were discussed with the patient preoperatively including but not limited to the risks of infection, bleeding, cardiopulmonary complications, the need for revision surgery, among others, and the patient was willing to proceed.   OPERATIVE PROCEDURE: The patient was brought to the operating room in a fasting state.  The left pectoral region was prepped and draped in usual sterile manner.  Anesthesia was obtained 1% lidocaine locally.  A 6 cm incision was performed the left pectoral region.  The pacemaker pocket was generated electrocautery and blunt dissection.  Access was obtained to the left subclavian vein by fine-needle aspiration.  MRI compatible leads were positioned into the right ventricular apical septum ( Medtronic [id number] ) and right atrial appendage ( Medtronic [id number] ) under fluoroscopic guidance.  After proper thresholds were obtained the leads were sutured in place with 0 silk.  The leads were connected to an MRI compatible dual-chamber rate responsive pacemaker generator ( Medtronic [id number] ).  The pacemaker pocket was irrigated with gentamicin solution.  The pacemaker generator was positioned into the pocket and the pocket was closed with 2-0 and 4-0 Vicryl, respectively.  Steri-Strips and pressure dressing were applied.  There were no periprocedural complications.  Postprocedural interrogation revealed appropriate dual-chamber atrial and ventricular sensing and pacing thresholds.

## 2018-12-01 NOTE — Transfer of Care (Signed)
Immediate Anesthesia Transfer of Care Note  Patient: Laura Walters  Procedure(s) Performed: INSERTION PACEMAKER Dual Chamber (Left Chest)  Patient Location: PACU  Anesthesia Type:MAC  Level of Consciousness: awake, alert , oriented and patient cooperative  Airway & Oxygen Therapy: Patient Spontanous Breathing  Post-op Assessment: Report given to RN and Post -op Vital signs reviewed and stable  Post vital signs: Reviewed and stable  Last Vitals:  Vitals Value Taken Time  BP 133/76 12/01/2018  1:32 PM  Temp 97.1   Pulse 64 12/01/2018  1:34 PM  Resp 16   SpO2 99 % 12/01/2018  1:34 PM  Vitals shown include unvalidated device data.  Last Pain:  Vitals:   12/01/18 1130  TempSrc: Tympanic  PainSc: 0-No pain         Complications: No apparent anesthesia complications

## 2018-12-02 ENCOUNTER — Inpatient Hospital Stay: Payer: Medicare Other

## 2018-12-02 LAB — BASIC METABOLIC PANEL
Anion gap: 9 (ref 5–15)
BUN: 14 mg/dL (ref 8–23)
CO2: 23 mmol/L (ref 22–32)
Calcium: 8.4 mg/dL — ABNORMAL LOW (ref 8.9–10.3)
Chloride: 104 mmol/L (ref 98–111)
Creatinine, Ser: 0.81 mg/dL (ref 0.44–1.00)
GFR calc Af Amer: >60 mL/min (ref >60)
GFR calc non Af Amer: >60 mL/min (ref >60)
Glucose, Bld: 110 mg/dL — ABNORMAL HIGH (ref 70–99)
Potassium: 4 mmol/L (ref 3.5–5.1)
Sodium: 136 mmol/L (ref 135–145)

## 2018-12-02 LAB — CBC
HCT: 37.5 % (ref 36.0–46.0)
HEMOGLOBIN: 11.9 g/dL — AB (ref 12.0–15.0)
MCH: 29.6 pg (ref 26.0–34.0)
MCHC: 31.7 g/dL (ref 30.0–36.0)
MCV: 93.3 fL (ref 80.0–100.0)
Platelets: 238 10*3/uL (ref 150–400)
RBC: 4.02 MIL/uL (ref 3.87–5.11)
RDW: 13.4 % (ref 11.5–15.5)
WBC: 8.7 10*3/uL (ref 4.0–10.5)
nRBC: 0 % (ref 0.0–0.2)

## 2018-12-02 NOTE — Progress Notes (Signed)
To xray via bed 

## 2018-12-02 NOTE — Discharge Summary (Signed)
[facility] [place] NAME: Laura Walters    MR#:  [id number]  DATE OF BIRTH:  29-Aug-1933  DATE OF ADMISSION:  11/30/2018   ADMITTING PHYSICIAN: Gorden Harms, MD  DATE OF DISCHARGE: 12/02/2018  PRIMARY CARE PHYSICIAN: Idelle Crouch, MD   ADMISSION DIAGNOSIS:  Syncope and collapse [R55] Bradycardia [R00.1] DISCHARGE DIAGNOSIS:  Active Problems:   Bradycardia, unspecified  SECONDARY DIAGNOSIS:   Past Medical History:  Diagnosis Date  . Arthritis   . Asthma   . Back pain   . Cancer of thyroid ([place]) 03/30/2015  . Cardiomyopathy ([place])    idiopathic  . Chest pain    non cardiac  . Chokes    easily   swallow test negative  . Constipation   . DDD (degenerative disc disease), lumbar   . Disc disorder   . GERD (gastroesophageal reflux disease)   . H/O wheezing    advair as needed  . Hypertension   . Hypothyroidism   . IBS (irritable bowel syndrome)   . Neuropathy   . Nocturia   . RAD (reactive airway disease)   . Rectocele   . Seasonal allergies   . Spinal stenosis   . Urge incontinence   . Vaginal atrophy   . Vaginal polyp    HOSPITAL COURSE:  Chief complaint; loss of consciousness   HISTORY OF PRESENT ILLNESS: Laura Walters  is a 85 y.o. female  who presented with recurrent episodes of syncope-followed by Dr. Eleonore Chiquito monitoring, presented to the emergency room via EMS with lightheadedness resulting in loss of consciousness, in the emergency room patient was found to be bradycardic and hypotensive initially, ER work-up was largely unimpressive, Dr. Fath/cardiology saw patient in the emergency room-recommended admission with plans for pacemaker placement, hospitalist asked to admit.  Please refer to the H&P dictated for further details  Hospital course; Acute recurrent syncope Secondary to recurrent intermittent bradycardia. Admit to telemetry bed, Dr. Saralyn Pilar performed a pacemaker placement 12/01/2018.  Patient doing  well and clinically stable this morning.  Denies any complaints.  Noted report of follow-up chest x-ray done this morning which was read as probable left lower lobe atelectasis and possible pneumonia,partially obscured by the patient's hand.  I offered to have imaging repeated to definitely rule out any possible pneumonia.  Patient and daughter at bedside declined.  They feel very confident that patient has no pneumonia especially due to patient being completely asymptomatic.  Patient denies any shortness of breath.  No cough.  No fevers.  Reviewed laboratory studies with normal white blood cell count.  Patient wishes to be discharged home as soon as possible this morning.  Already has an appointment scheduled to follow-up with cardiologist Dr. Ubaldo Glassing on December 05, 2018  *Acute recurrent bradycardia Pacemaker as above.  *Chronic hypothyroidism, unspecified Continue Synthroid. TSH last checked 13 days ago was low with normal T4.   *Chronic pain syndrome Stable Continue home regimen  *Chronic asthma without exacerbation Stable  *History of cardiomyopathy, ejection fraction 30% Stable on current regimen per admission note. No cardiac home meds seen on MAR.   *Depression Continue home regimen   *iron deficiency anemia  Hgb 11.9.  Remained stable.Marland Kitchen  DISCHARGE CONDITIONS:  Stable CONSULTS OBTAINED:  Treatment Team:  Teodoro Spray, MD DRUG ALLERGIES:   Allergies  Allergen Reactions  . Acetaminophen-Codeine Other (See Comments)  . Aleve [Naproxen] Hives    Causes dizziness  . Penicillins Hives  Has patient had a PCN reaction causing immediate rash, facial/tongue/throat swelling, SOB or lightheadedness with hypotension: hives Has patient had a PCN reaction causing severe rash involving mucus membranes or skin necrosis: no Has patient had a PCN reaction that required hospitalization no Has patient had a PCN reaction occurring within the last 10 years: no If all of the above  answers are "NO", then may proceed with Cephalosporin use.  . Sulfa Antibiotics Hives  . Codeine Hives, Itching and Other (See Comments)    dizziness   DISCHARGE MEDICATIONS:   Allergies as of 12/02/2018      Reactions   Acetaminophen-codeine Other (See Comments)   Aleve [naproxen] Hives   Causes dizziness   Penicillins Hives   Has patient had a PCN reaction causing immediate rash, facial/tongue/throat swelling, SOB or lightheadedness with hypotension: hives Has patient had a PCN reaction causing severe rash involving mucus membranes or skin necrosis: no Has patient had a PCN reaction that required hospitalization no Has patient had a PCN reaction occurring within the last 10 years: no If all of the above answers are "NO", then may proceed with Cephalosporin use.   Sulfa Antibiotics Hives   Codeine Hives, Itching, Other (See Comments)   dizziness      Medication List    TAKE these medications   acetaminophen 325 MG tablet Commonly known as:  TYLENOL Take 650 mg by mouth daily.   docusate sodium 100 MG capsule Commonly known as:  COLACE Take 100-200 mg by mouth 2 (two) times daily.   DULoxetine 60 MG capsule Commonly known as:  CYMBALTA Take 60 mg by mouth daily.   ferrous sulfate 325 (65 FE) MG tablet Take 325 mg by mouth daily with breakfast.   gabapentin 400 MG capsule Commonly known as:  NEURONTIN Take 800 mg by mouth 2 (two) times daily.   levothyroxine 100 MCG tablet Commonly known as:  SYNTHROID, LEVOTHROID Take 100 mcg by mouth daily. 100 mg for 6 day, than one-half tablet on 7th day (wed)   MAGNESIUM PO Take 250 mg by mouth daily.   omeprazole 40 MG capsule Commonly known as:  PRILOSEC Take 40 mg by mouth 2 (two) times daily.   potassium chloride 10 MEQ tablet Commonly known as:  K-DUR,KLOR-CON Take 10 mEq by mouth daily.   PreserVision AREDS 2 Caps Take 1 capsule by mouth 2 (two) times daily.   traMADol 50 MG tablet Commonly known as:   ULTRAM Take 50 mg by mouth every 6 (six) hours as needed.        DISCHARGE INSTRUCTIONS:   DIET:  Cardiac diet DISCHARGE CONDITION:  Stable ACTIVITY:  Activity as tolerated OXYGEN:  Home Oxygen: No.  Oxygen Delivery: room air DISCHARGE LOCATION:  home   If you experience worsening of your admission symptoms, develop shortness of breath, life threatening emergency, suicidal or homicidal thoughts you must seek medical attention immediately by calling 911 or calling your MD immediately  if symptoms less severe.  You Must read complete instructions/literature along with all the possible adverse reactions/side effects for all the Medicines you take and that have been prescribed to you. Take any new Medicines after you have completely understood and accpet all the possible adverse reactions/side effects.   Please note  You were cared for by a hospitalist during your hospital stay. If you have any questions about your discharge medications or the care you received while you were in the hospital after you are discharged, you can call the unit  and asked to speak with the hospitalist on call if the hospitalist that took care of you is not available. Once you are discharged, your primary care physician will handle any further medical issues. Please note that NO REFILLS for any discharge medications will be authorized once you are discharged, as it is imperative that you return to your primary care physician (or establish a relationship with a primary care physician if you do not have one) for your aftercare needs so that they can reassess your need for medications and monitor your lab values.    On the day of Discharge:  VITAL SIGNS:  Blood pressure 124/79, pulse 68, temperature 98.6 F (37 C), temperature source Oral, resp. rate 18, height 5' (1.524 m), weight 52.9 kg, SpO2 97 %. PHYSICAL EXAMINATION:  GENERAL:  85 y.o.-year-old patient lying in the bed with no acute distress.  EYES:  Pupils equal, round, reactive to light and accommodation. No scleral icterus. Extraocular muscles intact.  HEENT: Head atraumatic, normocephalic. Oropharynx and nasopharynx clear.  NECK:  Supple, no jugular venous distention. No thyroid enlargement, no tenderness.  LUNGS: Normal breath sounds bilaterally, no wheezing, rales,rhonchi or crepitation. No use of accessory muscles of respiration.  CARDIOVASCULAR: S1, S2 normal. No murmurs, rubs, or gallops.  ABDOMEN: Soft, non-tender, non-distended. Bowel sounds present. No organomegaly or mass.  EXTREMITIES: No pedal edema, cyanosis, or clubbing.  NEUROLOGIC: Cranial nerves II through XII are intact. Muscle strength 5/5 in all extremities. Sensation intact. Gait not checked.  PSYCHIATRIC: The patient is alert and oriented x 3.  SKIN: No obvious rash, lesion, or ulcer.  DATA REVIEW:   CBC Recent Labs  Lab 12/02/18 0545  WBC 8.7  HGB 11.9*  HCT 37.5  PLT 238    Chemistries  Recent Labs  Lab 12/02/18 0545  NA 136  K 4.0  CL 104  CO2 23  GLUCOSE 110*  BUN 14  CREATININE 0.81  CALCIUM 8.4*     Microbiology Results  Results for orders placed or performed during the hospital encounter of 11/30/18  Surgical PCR screen     Status: Abnormal   Collection Time: 11/30/18  4:34 PM  Result Value Ref Range Status   MRSA, PCR NEGATIVE NEGATIVE Final   Staphylococcus aureus POSITIVE (A) NEGATIVE Final    Comment: (NOTE) The Xpert SA Assay (FDA approved for NASAL specimens in patients 84 years of age and older), is one component of a comprehensive surveillance program. It is not intended to diagnose infection nor to guide or monitor treatment. Performed at [facility], [address] [place], [place] [address]     RADIOLOGY:  X-ray Chest Pa Or Ap  Result Date: 12/01/2018 CLINICAL DATA:  Status post pacemaker placement EXAM: CHEST  1 VIEW COMPARISON:  11/30/2018 chest radiograph. FINDINGS: Left subclavian 2 lead pacemaker  is noted with lead tips overlying the right atrium and right ventricle. Stable cardiomediastinal silhouette with normal heart size. No pneumothorax. No pleural effusion. No pulmonary edema. Minimal scarring versus atelectasis at the lung bases. No acute consolidative airspace disease. IMPRESSION: No pneumothorax status post 2 lead left subclavian pacemaker placement. Minimal bibasilar scarring versus atelectasis. Electronically Signed   By: Ilona Sorrel M.D.   On: 12/01/2018 14:19   Dg Chest 2 View  Result Date: 12/02/2018 CLINICAL DATA:  Pacemaker placed yesterday. EXAM: CHEST - 2 VIEW COMPARISON:  Yesterday. FINDINGS: The left subclavian bipolar pacemaker and leads are unchanged. The patient's hand is overlying the left lung base, making it  difficult to assess for underlying pneumonia or atelectasis. There is a suggestion of some patchy and linear opacity in the lingula, increased. The remainder of the lungs are clear. Old, healed right rib fracture. Diffuse osteopenia. Lower thoracic spine degenerative changes. IMPRESSION: Probable left lower lobe atelectasis and possible pneumonia, partially obscured by the patient's hand. Electronically Signed   By: Claudie Revering M.D.   On: 12/02/2018 09:14   Dg C-arm 1-60 Min-no Report  Result Date: 12/01/2018 Fluoroscopy was utilized by the requesting physician.  No radiographic interpretation.     Management plans discussed with the patient, family and they are in agreement.  CODE STATUS: Full Code   TOTAL TIME TAKING CARE OF THIS PATIENT: 37 minutes.    Nil Xiong M.D on 12/02/2018 at 10:32 AM  Between 7am to 6pm - Pager - [phone]  After 6pm go to [email or web address] - Proofreader  [facility]  Office  [phone]  CC: Primary care physician; Idelle Crouch, MD   Note: This dictation was prepared with Dragon dictation along with smaller phrase technology. Any transcriptional errors that result from this process  are unintentional.

## 2018-12-02 NOTE — Progress Notes (Signed)
Pt discharged to home via wc.  Instructions and rx given to pt.  Questions answered.  No distress.  

## 2018-12-02 NOTE — Progress Notes (Signed)
Patient Name: Laura Walters Date of Encounter: 12/02/2018  Hospital Problem List     Active Problems:   Bradycardia, unspecified    Patient Profile     Patient with history of recurrent syncope admitted with another syncopal episode.  Was noted to have bradycardia with sinus bradycardia with rates in the mid to upper 40s.  Permanent pacemaker was placed.  She currently is in sinus rhythm at 70.  Pacemaker site clean and dry.  Patient complains of nausea this morning.  Chest x-ray showed no pneumothorax post procedure and x-ray this morning does not appear appreciably changed.  Radiology official reading pending.  Subjective   Mild nausea  Inpatient Medications    . acetaminophen  650 mg Oral Daily  . docusate sodium  100 mg Oral BID  . DULoxetine  60 mg Oral Daily  . ferrous sulfate  325 mg Oral Q breakfast  . gabapentin  400 mg Oral BID  . levothyroxine  100 mcg Oral Daily  . magnesium oxide  200 mg Oral Daily  . pantoprazole  40 mg Oral Daily  . potassium chloride  10 mEq Oral Daily  . sodium chloride flush  3 mL Intravenous Q12H    Vital Signs    Vitals:   12/01/18 1421 12/01/18 1931 12/02/18 0444 12/02/18 0809  BP: (!) 153/83 (!) 157/71 (!) 150/104 124/79  Pulse: 61 65 75 68  Resp:  18 20 18   Temp:  98 F (36.7 C) 98.4 F (36.9 C) 98.6 F (37 C)  TempSrc:  Oral Oral Oral  SpO2: 98% 98% 97% 97%  Weight:      Height:        Intake/Output Summary (Last 24 hours) at 12/02/2018 0856 Last data filed at 12/02/2018 0512 Gross per 24 hour  Intake 511.07 ml  Output 1060 ml  Net -548.93 ml   Filed Weights   11/30/18 1038 11/30/18 1729 12/01/18 1130  Weight: 51.3 kg 52.9 kg 52.9 kg    Physical Exam    GEN: Well nourished, well developed, in no acute distress.  HEENT: normal.  Neck: Supple, no JVD, carotid bruits, or masses. Cardiac: RRR, no murmurs, rubs, or gallops. No clubbing, cyanosis, edema.  Radials/DP/PT 2+ and equal bilaterally.  Respiratory:   Respirations regular and unlabored, clear to auscultation bilaterally. GI: Soft, nontender, nondistended, BS + x 4. MS: no deformity or atrophy. Skin: warm and dry, no rash. Neuro:  Strength and sensation are intact. Psych: Normal affect.  Labs    CBC Recent Labs    12/01/18 1711 12/02/18 0545  WBC 8.2 8.7  HGB 11.0* 11.9*  HCT 34.2* 37.5  MCV 93.2 93.3  PLT 247 845   Basic Metabolic Panel Recent Labs    12/01/18 1711 12/02/18 0545  NA 135 136  K 4.0 4.0  CL 104 104  CO2 22 23  GLUCOSE 146* 110*  BUN 11 14  CREATININE 0.71 0.81  CALCIUM 7.9* 8.4*   Liver Function Tests No results for input(s): AST, ALT, ALKPHOS, BILITOT, PROT, ALBUMIN in the last 72 hours. No results for input(s): LIPASE, AMYLASE in the last 72 hours. Cardiac Enzymes Recent Labs    11/30/18 1048  TROPONINI <0.03   BNP Recent Labs    11/30/18 1048  BNP 47.0   D-Dimer No results for input(s): DDIMER in the last 72 hours. Hemoglobin A1C No results for input(s): HGBA1C in the last 72 hours. Fasting Lipid Panel No results for input(s): CHOL, HDL, LDLCALC, TRIG,  CHOLHDL, LDLDIRECT in the last 72 hours. Thyroid Function Tests No results for input(s): TSH, T4TOTAL, T3FREE, THYROIDAB in the last 72 hours.  Invalid input(s): FREET3  Telemetry    Sinus rhythm at 68  ECG    Sinus bradycardia  Radiology    X-ray Chest Pa Or Ap  Result Date: 12/01/2018 CLINICAL DATA:  Status post pacemaker placement EXAM: CHEST  1 VIEW COMPARISON:  11/30/2018 chest radiograph. FINDINGS: Left subclavian 2 lead pacemaker is noted with lead tips overlying the right atrium and right ventricle. Stable cardiomediastinal silhouette with normal heart size. No pneumothorax. No pleural effusion. No pulmonary edema. Minimal scarring versus atelectasis at the lung bases. No acute consolidative airspace disease. IMPRESSION: No pneumothorax status post 2 lead left subclavian pacemaker placement. Minimal bibasilar scarring  versus atelectasis. Electronically Signed   By: Ilona Sorrel M.D.   On: 12/01/2018 14:19   Dg Chest 2 View  Result Date: 11/30/2018 CLINICAL DATA:  Syncope EXAM: CHEST - 2 VIEW COMPARISON:  11/30/2018, 11/21/2018, CT 11/03/2018 FINDINGS: No acute opacity or pleural effusion. Scarring or atelectasis at the left base. Stable cardiomediastinal silhouette with aortic atherosclerosis. No pneumothorax. Nodule projecting over left cardiac border. Small right basilar lung nodule. IMPRESSION: No active cardiopulmonary disease. Bilateral lung nodules, previously imaged on CT. Electronically Signed   By: Donavan Foil M.D.   On: 11/30/2018 19:08   Dg Chest 2 View  Result Date: 11/03/2018 CLINICAL DATA:  Dyspnea.  Hypoxia.  Syncope this morning. EXAM: CHEST - 2 VIEW COMPARISON:  Chest x-rays dated 12/28/2017 and 01/03/2015, PET-CT dated 01/17/2018 and chest CT dated 07/04/2017 FINDINGS: There are numerous bilateral pulmonary nodules which appears essentially unchanged since the PET-CT scan dated 08/07/2018. The patient has numerous pulmonary nodules visible on the PET-CT there are not visible on chest x-ray. Heart size and vascularity are normal. Aortic atherosclerosis. No infiltrates or effusions. Fairly severe thoracolumbar scoliosis with convexity to the right. Small hiatal hernia. No acute bone abnormality. IMPRESSION: No acute abnormality. Numerous stable bilateral pulmonary nodules consistent with metastatic disease. Aortic Atherosclerosis (ICD10-I70.0). Electronically Signed   By: Lorriane Shire M.D.   On: 11/03/2018 14:01   Ct Head Wo Contrast  Result Date: 11/04/2018 CLINICAL DATA:  Syncope, simple, with normal neuro exam EXAM: CT HEAD WITHOUT CONTRAST TECHNIQUE: Contiguous axial images were obtained from the base of the skull through the vertex without intravenous contrast. COMPARISON:  Head CT 08/07/2018 FINDINGS: Brain: No acute infarct, hemorrhage, hydrocephalus, or collection. Age normal brain volume.  Isodense mass in the left sella, left cavernous sinus region, left suprasellar cistern, and left Meckel's cave region. This causes mass effect on the inferior left cerebrum. Patient has history of pituitary tumor. Mild chronic small vessel ischemia in the cerebral white matter. Vascular: No hyperdense vessel or unexpected calcification. Skull: No acute or aggressive finding. Sinuses/Orbits: Changes of trans-sphenoidal surgery. Partial coverage of the sinuses shows extensive ethmoid and maxillary opacification with sclerotic wall thickening from chronic inflammation. IMPRESSION: 1. No acute finding. 2. Grossly similar appearance of sellar, suprasellar, and left cavernous sinus region mass when compared to a 2018 brain MRI. Patient has history of pituitary tumor. Electronically Signed   By: Monte Fantasia M.D.   On: 11/04/2018 11:15   Ct Angio Neck W Or Wo Contrast  Result Date: 11/21/2018 CLINICAL DATA:  Hypotension during a stress test. Near syncope. History of thyroid cancer. EXAM: CT ANGIOGRAPHY NECK TECHNIQUE: Multidetector CT imaging of the neck was performed using the standard protocol during bolus administration  of intravenous contrast. Multiplanar CT image reconstructions and MIPs were obtained to evaluate the vascular anatomy. Carotid stenosis measurements (when applicable) are obtained utilizing NASCET criteria, using the distal internal carotid diameter as the denominator. CONTRAST:  56mL OMNIPAQUE IOHEXOL 350 MG/ML SOLN COMPARISON:  PET-CT 08/07/2018. Soft tissue neck CT 12/05/2013. FINDINGS: Aortic arch: Standard 3 vessel aortic arch with mild atherosclerotic plaque. Widely patent arch vessel origins. Soft plaque throughout the proximal left subclavian artery resulting in less than 50% stenosis. Right carotid system: Patent with mild plaque at the carotid bifurcation. No evidence of stenosis or dissection. Left carotid system: Patent with a focal, less than 50% stenosis of the mid common carotid  artery. Predominantly calcified plaque at the carotid bifurcation not resulting in stenosis. No evidence of dissection. Vertebral arteries: Patent and codominant without evidence of flow limiting stenosis or dissection. Tortuous proximal left V1 segments with up to mild narrowing. Skeleton: Advanced disc and facet degeneration in the cervical and upper thoracic spine. Moderately advanced median C1-2 arthropathy. Other neck: Fatty atrophy of the submandibular and parotid glands. Status post thyroidectomy similar appearance of left lower neck lymph nodes compared to the prior PET-CT. 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 Upper chest: Mild biapical lung scarring. IMPRESSION: 1. Mild atherosclerosis without significant cervical carotid or vertebral artery stenosis or dissection. 2.  Aortic Atherosclerosis (ICD10-I70.0). Electronically Signed   By: Logan Bores M.D.   On: 11/21/2018 15:52   Ct Angio Chest Pe W And/or Wo Contrast  Result Date: 11/03/2018 CLINICAL DATA:  Near syncope today. Feeling lightheaded. History of thyroid cancer with lung metastasis. EXAM: CT ANGIOGRAPHY CHEST WITH CONTRAST TECHNIQUE: Multidetector CT imaging of the chest was performed using the standard protocol during bolus administration of intravenous contrast. Multiplanar CT image reconstructions and MIPs were obtained to evaluate the vascular anatomy. CONTRAST:  29mL OMNIPAQUE IOHEXOL 350 MG/ML SOLN COMPARISON:  July 04, 2017 FINDINGS: Cardiovascular: Satisfactory opacification of the pulmonary arteries to the segmental level. No evidence of pulmonary embolism. Normal heart size. No pericardial effusion. Mediastinum/Nodes: No enlarged mediastinal, hilar, or axillary lymph nodes. Thyroid gland, trachea, and esophagus demonstrate no significant findings. Lungs/Pleura: There are multiple bilateral pulmonary nodules consistent with patient's known thyroid metastasis to the lungs. Which are viewed  nodules are unchanged compared prior exam. However, the largest nodule in the left lower lobe is increased in size compared to prior exam of 2018 currently measuring 1.6 x 1.7 cm. There is no pleural effusion. Upper Abdomen: There is a cyst in the dome of the liver unchanged compared to prior exam. Moderate hiatal hernia is identified. Musculoskeletal: No definite focal discrete lytic or blastic lesions are identified. Degenerative joint changes of the spine. Review of the MIP images confirms the above findings. IMPRESSION: No pulmonary embolus. Multiple pulmonary nodules in both lungs consistent with known metastasis. Majority of the nodules are not significantly changed compared to prior exam. However, the largest nodule in the left lower lobe is increased in size currently measuring 1.6 x 1.7 cm. Electronically Signed   By: Abelardo Diesel M.D.   On: 11/03/2018 15:05   Mr Brain Wo Contrast  Result Date: 11/21/2018 CLINICAL DATA:  Acute headache EXAM: MRI HEAD WITHOUT CONTRAST TECHNIQUE: Multiplanar, multiecho pulse sequences of the brain and surrounding structures were obtained without intravenous contrast. COMPARISON:  CTA neck 11/21/2018 Brain MRI 01/12/2017 FINDINGS: BRAIN: There is no acute infarct, acute hemorrhage, hydrocephalus or extra-axial collection. Intra sellar mass extending into the left cavernous  sinus and along the left carotid terminus is unchanged. Extension into the clivus is unchanged. The mass measures approximately 3.0 x 3.8 by 2.8 cm. There is magnetic susceptibility effect within the mass. Early confluent hyperintense T2-weighted signal of the periventricular and deep white matter, most commonly due to chronic ischemic microangiopathy. Generalized atrophy without lobar predilection. Susceptibility-sensitive sequences show no chronic microhemorrhage or superficial siderosis. VASCULAR: Major intracranial arterial and venous sinus flow voids are normal. SKULL AND UPPER CERVICAL SPINE:  Calvarial bone marrow signal is normal. There is no skull base mass. Visualized upper cervical spine and soft tissues are normal. SINUSES/ORBITS: Moderate paranasal sinus mucosal thickening with fluid levels in both maxillary sinuses. The orbits are normal. IMPRESSION: 1. No acute intracranial abnormality. 2. Chronic ischemic microangiopathy. 3. 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 4. Moderate paranasal sinus mucosal thickening with bilateral maxillary sinus fluid levels. Correlate for symptoms of acute sinusitis. This could account for the patient's headache. 5. Assess stone for small parenchymal metastases limited without IV contrast. Electronically Signed   By: Ulyses Jarred M.D.   On: 11/21/2018 21:48   Dg Chest Port 1 View  Result Date: 11/30/2018 CLINICAL DATA:  Syncope EXAM: PORTABLE CHEST 1 VIEW COMPARISON:  11/21/2018 FINDINGS: Normal heart size and mediastinal contours. Known pulmonary nodules. Chest CT was obtained 1 month ago. There is no edema, consolidation, effusion, or pneumothorax. IMPRESSION: 1. No evidence of acute disease. 2. Pulmonary nodules that are known from recent chest CT. Electronically Signed   By: Monte Fantasia M.D.   On: 11/30/2018 10:46   Dg Chest Port 1 View  Result Date: 11/21/2018 CLINICAL DATA:  Chest pain. EXAM: PORTABLE CHEST 1 VIEW COMPARISON:  11/03/2018. Chest CTA dated 11/03/2018 FINDINGS: Previously demonstrated nodule at the right lung base. Other previously demonstrated nodules are not visible. Minimal linear atelectasis at the left lung base. Clear right lung. Normal sized heart. Diffuse osteopenia IMPRESSION: 1. Minimal left basilar atelectasis. 2. Previously demonstrated right basilar nodule. Electronically Signed   By: Claudie Revering M.D.   On: 11/21/2018 13:25   Dg C-arm 1-60 Min-no Report  Result Date: 12/01/2018 Fluoroscopy was utilized by the  requesting physician.  No radiographic interpretation.    Assessment & Plan    Patient with history of recurrent syncope and intermittent bradycardia.  Status post dual-chamber permanent pacemaker placement.  Pacemaker site appears clean and dry.  Patient is hemodynamically stable and desires to go home.  Nausea may be secondary to p.o. antibiotics versus sedation yesterday.  Would follow post attempt a today and if stable okay for discharge from a cardiac standpoint.  Patient has appointment in March 10 in our office.  We will keep this appointment and reevaluate at that time.  Signed, Javier Docker  MD 12/02/2018, 8:56 AM  Pager: [phone]

## 2018-12-03 ENCOUNTER — Encounter: Payer: Self-pay | Admitting: Cardiology

## 2018-12-12 ENCOUNTER — Encounter: Payer: Medicare Other | Admitting: Obstetrics and Gynecology

## 2019-01-06 ENCOUNTER — Other Ambulatory Visit: Payer: Self-pay

## 2019-01-06 ENCOUNTER — Emergency Department: Payer: Medicare Other

## 2019-01-06 ENCOUNTER — Emergency Department
Admission: EM | Admit: 2019-01-06 | Discharge: 2019-01-06 | Disposition: A | Discharge status: Home / Self Care | Payer: Medicare Other | Attending: Emergency Medicine | Admitting: Emergency Medicine

## 2019-01-06 ENCOUNTER — Encounter: Payer: Self-pay | Admitting: *Deleted

## 2019-01-06 DIAGNOSIS — E039 Hypothyroidism, unspecified: Secondary | ICD-10-CM | POA: Diagnosis not present

## 2019-01-06 DIAGNOSIS — J45909 Unspecified asthma, uncomplicated: Secondary | ICD-10-CM | POA: Diagnosis not present

## 2019-01-06 DIAGNOSIS — Z85850 Personal history of malignant neoplasm of thyroid: Secondary | ICD-10-CM | POA: Diagnosis not present

## 2019-01-06 DIAGNOSIS — Z96642 Presence of left artificial hip joint: Secondary | ICD-10-CM | POA: Insufficient documentation

## 2019-01-06 DIAGNOSIS — Z79899 Other long term (current) drug therapy: Secondary | ICD-10-CM | POA: Insufficient documentation

## 2019-01-06 DIAGNOSIS — I10 Essential (primary) hypertension: Secondary | ICD-10-CM | POA: Diagnosis not present

## 2019-01-06 DIAGNOSIS — R55 Syncope and collapse: Secondary | ICD-10-CM | POA: Insufficient documentation

## 2019-01-06 DIAGNOSIS — Z950 Presence of cardiac pacemaker: Secondary | ICD-10-CM | POA: Diagnosis not present

## 2019-01-06 DIAGNOSIS — Z96651 Presence of right artificial knee joint: Secondary | ICD-10-CM | POA: Diagnosis not present

## 2019-01-06 DIAGNOSIS — I252 Old myocardial infarction: Secondary | ICD-10-CM | POA: Diagnosis not present

## 2019-01-06 LAB — URINALYSIS, COMPLETE (UACMP) WITH MICROSCOPIC
Bacteria, UA: NONE SEEN
Bilirubin Urine: NEGATIVE
Glucose, UA: NEGATIVE mg/dL
Hgb urine dipstick: NEGATIVE
Ketones, ur: NEGATIVE mg/dL
Nitrite: NEGATIVE
Protein, ur: NEGATIVE mg/dL
Specific Gravity, Urine: 1.006 (ref 1.005–1.030)
pH: 7 (ref 5.0–8.0)

## 2019-01-06 LAB — CBC WITH DIFFERENTIAL/PLATELET
Abs Immature Granulocytes: 0.05 10*3/uL (ref 0.00–0.07)
Basophils Absolute: 0 10*3/uL (ref 0.0–0.1)
Basophils Relative: 0 %
Eosinophils Absolute: 0.6 10*3/uL — ABNORMAL HIGH (ref 0.0–0.5)
Eosinophils Relative: 8 %
HCT: 34.1 % — ABNORMAL LOW (ref 36.0–46.0)
Hemoglobin: 10.8 g/dL — ABNORMAL LOW (ref 12.0–15.0)
Immature Granulocytes: 1 %
Lymphocytes Relative: 18 %
Lymphs Abs: 1.4 10*3/uL (ref 0.7–4.0)
MCH: 29.6 pg (ref 26.0–34.0)
MCHC: 31.7 g/dL (ref 30.0–36.0)
MCV: 93.4 fL (ref 80.0–100.0)
Monocytes Absolute: 0.9 10*3/uL (ref 0.1–1.0)
Monocytes Relative: 12 %
Neutro Abs: 4.8 10*3/uL (ref 1.7–7.7)
Neutrophils Relative %: 61 %
Platelets: 205 10*3/uL (ref 150–400)
RBC: 3.65 MIL/uL — ABNORMAL LOW (ref 3.87–5.11)
RDW: 13.4 % (ref 11.5–15.5)
WBC: 7.8 10*3/uL (ref 4.0–10.5)
nRBC: 0 % (ref 0.0–0.2)

## 2019-01-06 LAB — TROPONIN I
Troponin I: <0.03 ng/mL (ref <0.03)
Troponin I: <0.03 ng/mL (ref <0.03)

## 2019-01-06 LAB — COMPREHENSIVE METABOLIC PANEL
ALT: 11 U/L (ref 0–44)
AST: 21 U/L (ref 15–41)
Albumin: 3.2 g/dL — ABNORMAL LOW (ref 3.5–5.0)
Alkaline Phosphatase: 121 U/L (ref 38–126)
Anion gap: 7 (ref 5–15)
BUN: 13 mg/dL (ref 8–23)
CO2: 26 mmol/L (ref 22–32)
Calcium: 8 mg/dL — ABNORMAL LOW (ref 8.9–10.3)
Chloride: 104 mmol/L (ref 98–111)
Creatinine, Ser: 0.86 mg/dL (ref 0.44–1.00)
GFR calc Af Amer: >60 mL/min (ref >60)
GFR calc non Af Amer: >60 mL/min (ref >60)
Glucose, Bld: 104 mg/dL — ABNORMAL HIGH (ref 70–99)
Potassium: 4.2 mmol/L (ref 3.5–5.1)
Sodium: 137 mmol/L (ref 135–145)
Total Bilirubin: 0.6 mg/dL (ref 0.3–1.2)
Total Protein: 6.4 g/dL — ABNORMAL LOW (ref 6.5–8.1)

## 2019-01-06 MED ORDER — SODIUM CHLORIDE 0.9 % IV BOLUS
500.0000 mL | Freq: Once | INTRAVENOUS | Status: AC
  Administered 2019-01-06: 16:00:00 500 mL via INTRAVENOUS

## 2019-01-06 NOTE — ED Provider Notes (Signed)
Patient is resting comfortably.  Reviewed her lab test with her.  She would like to be discharged, is resting comfortably and reports this is happened to her about 5 times now.  She does have a pacemaker in place which was interrogated and reviewed by Dr. Burlene Arnt as well.  She appears appropriate.  Will follow Dr. Andre Lefort plan at signout which was to discharge the patient if repeat troponin look normal.  Return precautions and treatment recommendations and follow-up discussed with the patient who is agreeable with the plan.    Laura Kitten, MD 01/06/19 [phone]

## 2019-01-06 NOTE — Discharge Instructions (Signed)
You have refused admission to the hospital which is certainly you choice but does limit our ability to watch and take care of you.  If you feel worse or change your mind return to the emergency room.  Drink plenty of fluids and follow closely with your primary care doctor.  As well as her cardiologist.

## 2019-01-06 NOTE — ED Notes (Signed)
Pt assisted to/from bed/toilet. Urinated 350cc. Urine sample sent to lab.

## 2019-01-06 NOTE — ED Notes (Signed)
Waiting for son to pick up

## 2019-01-06 NOTE — ED Notes (Signed)
Patient's son called to give the patient a ride home.

## 2019-01-06 NOTE — ED Provider Notes (Addendum)
[facility] Emergency Department Provider Note  ____________________________________________   I have reviewed the triage vital signs and the nursing notes. Where available I have reviewed prior notes and, if possible and indicated, outside hospital notes.    HISTORY  Chief Complaint Loss of Consciousness    HPI Laura Walters is a 85 y.o. female  History repeated syncope, idiopathic cardiomyopathy with a last echo performed here an EF of 55 to 60% with impaired relaxation parameters noted.  Patient does have a history of recurrent syncope, had a recent pacemaker placed, [employer], for presumed sick sinus that appears, has been in her normal state of health stood up rapidly today felt lightheaded and passed out for a brief minute did not hit her head does not want a CT scan.  She remembers falling.  She does not have any chest pain or shortness of breath.  This is the same thing is happened to her multiple times in the past.  She has been admitted twice in the last 3 months for this.  She is adamant she will not be admitted this time.   Past Medical History:  Diagnosis Date  . Arthritis   . Asthma   . Back pain   . Cancer of thyroid ([place]) 03/30/2015  . Cardiomyopathy ([place])    idiopathic  . Chest pain    non cardiac  . Chokes    easily   swallow test negative  . Constipation   . DDD (degenerative disc disease), lumbar   . Disc disorder   . GERD (gastroesophageal reflux disease)   . H/O wheezing    advair as needed  . Hypertension   . Hypothyroidism   . IBS (irritable bowel syndrome)   . Neuropathy   . Nocturia   . RAD (reactive airway disease)   . Rectocele   . Seasonal allergies   . Spinal stenosis   . Urge incontinence   . Vaginal atrophy   . Vaginal polyp     Patient Active Problem List   Diagnosis Date Noted  . Bradycardia, unspecified 11/30/2018  . Bradycardia 11/21/2018  . NSTEMI (non-ST elevated myocardial infarction) ([place]) 11/18/2018   . Syncope 11/03/2018  . Vaginal bleeding 08/02/2018  . Abrasion of vagina 08/02/2018  . Goals of care, counseling/discussion 01/05/2018  . Uterine prolapse 02/03/2017  . Pulmonary metastases ([place]) 04/09/2016  . RAD (reactive airway disease) 02/27/2016  . Recurrent sinus infections 01/31/2016  . H/O total knee replacement 01/20/2016  . S/P total knee arthroplasty 01/05/2016  . 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 Arthropathy, traumatic, knee 12/16/2015  . Cystocele, midline 12/02/2015  . Rectocele 12/02/2015  . Frequent UTI 11/26/2015  . Acquired hypothyroidism 10/13/2015  . Pituitary tumor 10/13/2015  . Degeneration of intervertebral disc of lumbar region 08/01/2015  . Lumbar canal stenosis 08/01/2015  . Neuritis or radiculitis due to rupture of lumbar intervertebral disc 08/01/2015  . LBP (low back pain) 03/30/2015  . Neuropathy ([place]) 03/30/2015  . Chest pain, non-cardiac 03/30/2015  . Neoplasm of pituitary gland 03/30/2015  . Cancer of thyroid ([place]) 03/30/2015  . Combined fat and carbohydrate induced hyperlipemia 10/17/2014  . Primary cardiomyopathy ([place]) 10/14/2014    Past Surgical History:  Procedure Laterality Date  . BREAST BIOPSY Right 86 and 92   2 bx-neg  . CATARACT EXTRACTION W/PHACO Right 05/19/2016   Procedure: CATARACT EXTRACTION PHACO AND INTRAOCULAR LENS PLACEMENT (IOC);  Surgeon: Estill Cotta, MD;  Location: [facility] ORS;  Service: Ophthalmology;  Laterality: Right;  Lot# [id number] [place]:   1:25.3 AP%:  24.9% CDE:  40.11  . CATARACT EXTRACTION W/PHACO Left 08/11/2016   Procedure: CATARACT EXTRACTION PHACO AND INTRAOCULAR LENS PLACEMENT (IOC);  Surgeon: Estill Cotta, MD;  Location: [facility] ORS;  Service: Ophthalmology;  Laterality: Left;  [place] 1.24 AP% 26.1 CDE 37.79 FLUID PACK LOT # [id number]  . DILATION AND CURETTAGE OF UTERUS    . HEMORROIDECTOMY     x 2  . JOINT REPLACEMENT     left hip  . KNEE ARTHROPLASTY Right 01/05/2016    Procedure: COMPUTER ASSISTED TOTAL KNEE ARTHROPLASTY;  Surgeon: Dereck Leep, MD;  Location: [facility] ORS;  Service: Orthopedics;  Laterality: Right;  . KNEE ARTHROSCOPY Right 05/19/2015   Procedure: Right knee arthrosocpy medail and lateral menisectomy, chondroplasty ;  Surgeon: Dereck Leep, MD;  Location: [facility] ORS;  Service: Orthopedics;  Laterality: Right;  . Left total hip arthroplasty    . PACEMAKER INSERTION Left 12/01/2018   Procedure: INSERTION PACEMAKER Dual Chamber;  Surgeon: Isaias Cowman, MD;  Location: [facility] ORS;  Service: Cardiovascular;  Laterality: Left;  . PAROTIDECTOMY    . PITUITARY SURGERY    . THYROID SURGERY     bx  . THYROIDECTOMY      Prior to Admission medications   Medication Sig Start Date End Date Taking? Authorizing Provider  acetaminophen (TYLENOL) 325 MG tablet Take 650 mg by mouth daily.    [provider]  docusate sodium (COLACE) 100 MG capsule Take 100-200 mg by mouth 2 (two) times daily.     [provider]  DULoxetine (CYMBALTA) 60 MG capsule Take 60 mg by mouth daily.  02/02/16   [provider]  ferrous sulfate 325 (65 FE) MG tablet Take 325 mg by mouth daily with breakfast.     [provider]  gabapentin (NEURONTIN) 400 MG capsule Take 800 mg by mouth 2 (two) times daily.  02/09/16   [provider]  levothyroxine (SYNTHROID, LEVOTHROID) 100 MCG tablet Take 100 mcg by mouth daily. 100 mg for 6 day, than one-half tablet on 7th day (wed) 10/04/17   [provider]  MAGNESIUM PO Take 250 mg by mouth daily.     [provider]  Multiple Vitamins-Minerals (PRESERVISION AREDS 2) CAPS Take 1 capsule by mouth 2 (two) times daily.    [provider]  omeprazole (PRILOSEC) 40 MG capsule Take 40 mg by mouth 2 (two) times daily.  02/16/16   [provider]  potassium chloride (K-DUR,KLOR-CON) 10 MEQ tablet Take 10 mEq by mouth daily.     [provider]  traMADol (ULTRAM) 50  MG tablet Take 50 mg by mouth every 6 (six) hours as needed.    [provider]    Allergies Acetaminophen-codeine; Aleve [naproxen]; Penicillins; Sulfa antibiotics; and Codeine  Family History  Problem Relation Age of Onset  . Heart disease Father   . Heart attack Father   . Diabetes Paternal Uncle   . Cancer Neg Hx   . Breast cancer Neg Hx     Social History Social History   Tobacco Use  . Smoking status: Never Smoker  . Smokeless tobacco: Never Used  Substance Use Topics  . Alcohol use: No  . Drug use: No    Review of Systems Constitutional: No fever/chills Eyes: No visual changes. ENT: No sore throat. No stiff neck no neck pain Cardiovascular: Denies chest pain. Respiratory: Denies shortness of breath. Gastrointestinal:   no vomiting.  No  diarrhea.  No constipation. Genitourinary: Negative for dysuria. Musculoskeletal: Negative lower extremity swelling Skin: Negative for rash. Neurological: Negative for severe headaches, focal weakness or numbness.   ____________________________________________   PHYSICAL EXAM:  VITAL SIGNS: ED Triage Vitals  Enc Vitals Group     BP 01/06/19 1217 (!) 141/85     Pulse Rate 01/06/19 1217 60     Resp 01/06/19 1217 18     Temp 01/06/19 1217 97.6 F (36.4 C)     Temp src --      SpO2 01/06/19 1217 98 %     Weight 01/06/19 1219 116 lb (52.6 kg)     Height 01/06/19 1219 5' (1.524 m)     Head Circumference --      Peak Flow --      Pain Score 01/06/19 1218 0     Pain Loc --      Pain Edu? --      Excl. in [place]? --     Constitutional: Alert and oriented. Well appearing and in no acute distress. Eyes: Conjunctivae are normal Head: Atraumatic HEENT: No congestion/rhinnorhea. Mucous membranes are moist.  Oropharynx non-erythematous Neck:   Nontender with no meningismus, no masses, no stridor Cardiovascular: Normal rate, regular rhythm. Grossly normal heart sounds.  Good peripheral circulation. Respiratory: Normal  respiratory effort.  No retractions. Lungs CTAB. Abdominal: Soft and nontender. No distention. No guarding no rebound Back:  There is no focal tenderness or step off.  there is no midline tenderness there are no lesions noted. there is no CVA tenderness Musculoskeletal: No lower extremity tenderness, no upper extremity tenderness. No joint effusions, no DVT signs strong distal pulses no edema Neurologic:  Normal speech and language. No gross focal neurologic deficits are appreciated.  Skin:  Skin is warm, dry and intact. No rash noted. Psychiatric: Mood and affect are normal. Speech and behavior are normal.  ____________________________________________   LABS (all labs ordered are listed, but only abnormal results are displayed)  Labs Reviewed  CBC WITH DIFFERENTIAL/PLATELET - Abnormal; Notable for the following components:      Result Value   RBC 3.65 (*)    Hemoglobin 10.8 (*)    HCT 34.1 (*)    Eosinophils Absolute 0.6 (*)    All other components within normal limits  COMPREHENSIVE METABOLIC PANEL - Abnormal; Notable for the following components:   Glucose, Bld 104 (*)    Calcium 8.0 (*)    Total Protein 6.4 (*)    Albumin 3.2 (*)    All other components within normal limits  TROPONIN I  URINALYSIS, COMPLETE (UACMP) WITH MICROSCOPIC    Pertinent labs  results that were available during my care of the patient were reviewed by me and considered in my medical decision making (see chart for details). ____________________________________________  EKG  I personally interpreted any EKGs ordered by me or triage AV dual paced rhythm with no acute ST elevation or depression, LAD noted no acute ischemia ____________________________________________  RADIOLOGY  Pertinent labs & imaging results that were available during my care of the patient were reviewed by me and considered in my medical decision making (see chart for details). If possible, patient and/or family made aware of any  abnormal findings.  No results found. ____________________________________________    PROCEDURES  Procedure(s) performed: None  Procedures  Critical Care performed: None  ____________________________________________   INITIAL IMPRESSION / ASSESSMENT AND PLAN / ED COURSE  Pertinent labs & imaging results that were available during my care of  the patient were reviewed by me and considered in my medical decision making (see chart for details).  Had another syncopal event this is not uncommon for her, I did have [employer] evaluate her pacemaker, it is pacing well with no tacky or bradycardia dysrhythmias or other dysrhythmias mentioned.  Patient has good conduction and good connectivity.  There is no evidence of acute cardiac mediated event from the point of view of [employer].  She has no complaints or symptoms at this time she did not hit her head to the extent that I can determine she declined CT scan.  We will send 2 sets of cardiac markers as a precaution, if that is negative is my hope that we can get her safely home.  I have paged Dr. Nehemiah Massed of cardiology.  ----------------------------------------- 3:25 PM on 01/06/2019 -----------------------------------------   No call back from cardiology despite several calls signed out to dr. Jacqualine Code    ____________________________________________   FINAL CLINICAL IMPRESSION(S) / ED DIAGNOSES  Final diagnoses:  None      This chart was dictated using voice recognition software.  Despite best efforts to proofread,  errors can occur which can change meaning.      Schuyler Amor, MD 01/06/19 1449    Schuyler Amor, MD 01/06/19 [phone]

## 2019-01-06 NOTE — ED Triage Notes (Signed)
Per EMS, Patient stood up and had a syncopal episode and was assisted to the ground. Patient arrives alert and oriented x4. Patient had a pacemaker inserted by Dr. Ubaldo Glassing two weeks ago for similar episodes. Patient had NSR at times and then a paced rhythm per EMTs

## 2019-01-06 NOTE — ED Notes (Signed)
Pt resting comfortably in bed watching tv. Denies pain. Call bell within reach. Bed in lowest position. Rail up. Pt requesting update from EDP. EDP [place] notified.

## 2019-01-06 NOTE — ED Notes (Signed)
grn top for 2nd trop sent to lab.

## 2019-01-09 ENCOUNTER — Observation Stay
Admission: EM | Admit: 2019-01-09 | Discharge: 2019-01-10 | Disposition: A | Discharge status: Home / Self Care | Payer: Medicare Other | Attending: Internal Medicine | Admitting: Internal Medicine

## 2019-01-09 ENCOUNTER — Other Ambulatory Visit: Payer: Self-pay

## 2019-01-09 DIAGNOSIS — R55 Syncope and collapse: Secondary | ICD-10-CM | POA: Diagnosis present

## 2019-01-09 DIAGNOSIS — I429 Cardiomyopathy, unspecified: Secondary | ICD-10-CM | POA: Insufficient documentation

## 2019-01-09 DIAGNOSIS — K219 Gastro-esophageal reflux disease without esophagitis: Secondary | ICD-10-CM | POA: Diagnosis not present

## 2019-01-09 DIAGNOSIS — Z8249 Family history of ischemic heart disease and other diseases of the circulatory system: Secondary | ICD-10-CM | POA: Insufficient documentation

## 2019-01-09 DIAGNOSIS — I10 Essential (primary) hypertension: Secondary | ICD-10-CM | POA: Diagnosis not present

## 2019-01-09 DIAGNOSIS — Z950 Presence of cardiac pacemaker: Secondary | ICD-10-CM | POA: Insufficient documentation

## 2019-01-09 DIAGNOSIS — E039 Hypothyroidism, unspecified: Secondary | ICD-10-CM | POA: Insufficient documentation

## 2019-01-09 DIAGNOSIS — F329 Major depressive disorder, single episode, unspecified: Secondary | ICD-10-CM | POA: Diagnosis not present

## 2019-01-09 DIAGNOSIS — J45909 Unspecified asthma, uncomplicated: Secondary | ICD-10-CM | POA: Insufficient documentation

## 2019-01-09 DIAGNOSIS — G629 Polyneuropathy, unspecified: Secondary | ICD-10-CM | POA: Insufficient documentation

## 2019-01-09 DIAGNOSIS — Z85850 Personal history of malignant neoplasm of thyroid: Secondary | ICD-10-CM | POA: Insufficient documentation

## 2019-01-09 DIAGNOSIS — M5136 Other intervertebral disc degeneration, lumbar region: Secondary | ICD-10-CM | POA: Diagnosis not present

## 2019-01-09 DIAGNOSIS — K589 Irritable bowel syndrome without diarrhea: Secondary | ICD-10-CM | POA: Insufficient documentation

## 2019-01-09 DIAGNOSIS — N3941 Urge incontinence: Secondary | ICD-10-CM | POA: Diagnosis not present

## 2019-01-09 DIAGNOSIS — Z79899 Other long term (current) drug therapy: Secondary | ICD-10-CM | POA: Insufficient documentation

## 2019-01-09 LAB — CBC WITH DIFFERENTIAL/PLATELET
Abs Immature Granulocytes: 0.02 10*3/uL (ref 0.00–0.07)
Basophils Absolute: 0 10*3/uL (ref 0.0–0.1)
Basophils Relative: 0 %
Eosinophils Absolute: 0.6 10*3/uL — ABNORMAL HIGH (ref 0.0–0.5)
Eosinophils Relative: 9 %
HCT: 29.6 % — ABNORMAL LOW (ref 36.0–46.0)
Hemoglobin: 9.3 g/dL — ABNORMAL LOW (ref 12.0–15.0)
Immature Granulocytes: 0 %
Lymphocytes Relative: 18 %
Lymphs Abs: 1.1 10*3/uL (ref 0.7–4.0)
MCH: 29.8 pg (ref 26.0–34.0)
MCHC: 31.4 g/dL (ref 30.0–36.0)
MCV: 94.9 fL (ref 80.0–100.0)
Monocytes Absolute: 0.8 10*3/uL (ref 0.1–1.0)
Monocytes Relative: 13 %
Neutro Abs: 3.7 10*3/uL (ref 1.7–7.7)
Neutrophils Relative %: 60 %
Platelets: 181 10*3/uL (ref 150–400)
RBC: 3.12 MIL/uL — ABNORMAL LOW (ref 3.87–5.11)
RDW: 13.6 % (ref 11.5–15.5)
WBC: 6.2 10*3/uL (ref 4.0–10.5)
nRBC: 0 % (ref 0.0–0.2)

## 2019-01-09 LAB — URINALYSIS, COMPLETE (UACMP) WITH MICROSCOPIC
Bacteria, UA: NONE SEEN
Bilirubin Urine: NEGATIVE
Glucose, UA: NEGATIVE mg/dL
Hgb urine dipstick: NEGATIVE
Ketones, ur: NEGATIVE mg/dL
Nitrite: NEGATIVE
Protein, ur: NEGATIVE mg/dL
Specific Gravity, Urine: 1.011 (ref 1.005–1.030)
pH: 7 (ref 5.0–8.0)

## 2019-01-09 LAB — COMPREHENSIVE METABOLIC PANEL
ALT: 10 U/L (ref 0–44)
AST: 18 U/L (ref 15–41)
Albumin: 3 g/dL — ABNORMAL LOW (ref 3.5–5.0)
Alkaline Phosphatase: 97 U/L (ref 38–126)
Anion gap: 10 (ref 5–15)
BUN: 15 mg/dL (ref 8–23)
CO2: 23 mmol/L (ref 22–32)
Calcium: 7.2 mg/dL — ABNORMAL LOW (ref 8.9–10.3)
Chloride: 106 mmol/L (ref 98–111)
Creatinine, Ser: 0.88 mg/dL (ref 0.44–1.00)
GFR calc Af Amer: >60 mL/min (ref >60)
GFR calc non Af Amer: >60 mL/min (ref >60)
Glucose, Bld: 103 mg/dL — ABNORMAL HIGH (ref 70–99)
Potassium: 3.6 mmol/L (ref 3.5–5.1)
Sodium: 139 mmol/L (ref 135–145)
Total Bilirubin: 0.2 mg/dL — ABNORMAL LOW (ref 0.3–1.2)
Total Protein: 5.7 g/dL — ABNORMAL LOW (ref 6.5–8.1)

## 2019-01-09 LAB — TROPONIN I: Troponin I: <0.03 ng/mL (ref <0.03)

## 2019-01-09 MED ORDER — ACETAMINOPHEN 325 MG PO TABS: 650.0000 mg | ORAL_TABLET | Freq: Four times a day (QID) | ORAL | Status: DC | PRN

## 2019-01-09 MED ORDER — GABAPENTIN 400 MG PO CAPS
800.0000 mg | ORAL_CAPSULE | Freq: Two times a day (BID) | ORAL | Status: DC
  Administered 2019-01-09 – 2019-01-10 (×2): 800 mg via ORAL
  Filled 2019-01-09 (×2): qty 2

## 2019-01-09 MED ORDER — DOCUSATE SODIUM 100 MG PO CAPS
100.0000 mg | ORAL_CAPSULE | Freq: Two times a day (BID) | ORAL | Status: DC
  Administered 2019-01-09 – 2019-01-10 (×2): 100 mg via ORAL
  Filled 2019-01-09 (×2): qty 1

## 2019-01-09 MED ORDER — TRAMADOL HCL 50 MG PO TABS: 50.0000 mg | ORAL_TABLET | Freq: Four times a day (QID) | ORAL | Status: DC | PRN

## 2019-01-09 MED ORDER — SODIUM CHLORIDE 0.9% FLUSH: 3.0000 mL | INTRAVENOUS | Status: DC | PRN

## 2019-01-09 MED ORDER — DULOXETINE HCL 30 MG PO CPEP
60.0000 mg | ORAL_CAPSULE | Freq: Every day | ORAL | Status: DC
  Administered 2019-01-10: 60 mg via ORAL
  Filled 2019-01-09: qty 2

## 2019-01-09 MED ORDER — ENOXAPARIN SODIUM 40 MG/0.4ML ~~LOC~~ SOLN
40.0000 mg | SUBCUTANEOUS | Status: DC
  Administered 2019-01-09: 40 mg via SUBCUTANEOUS
  Filled 2019-01-09: qty 0.4

## 2019-01-09 MED ORDER — SODIUM CHLORIDE 0.9 % IV SOLN: 250.0000 mL | INTRAVENOUS | Status: DC | PRN

## 2019-01-09 MED ORDER — SODIUM CHLORIDE 0.9% FLUSH
3.0000 mL | Freq: Two times a day (BID) | INTRAVENOUS | Status: DC
  Administered 2019-01-09 – 2019-01-10 (×2): 3 mL via INTRAVENOUS

## 2019-01-09 MED ORDER — ONDANSETRON HCL 4 MG PO TABS: 4.0000 mg | ORAL_TABLET | Freq: Four times a day (QID) | ORAL | Status: DC | PRN

## 2019-01-09 MED ORDER — SODIUM CHLORIDE 0.9% FLUSH: 3.0000 mL | Freq: Two times a day (BID) | INTRAVENOUS | Status: DC

## 2019-01-09 MED ORDER — FERROUS SULFATE 325 (65 FE) MG PO TABS
325.0000 mg | ORAL_TABLET | Freq: Every day | ORAL | Status: DC
  Administered 2019-01-10: 325 mg via ORAL
  Filled 2019-01-09: qty 1

## 2019-01-09 MED ORDER — ACETAMINOPHEN 650 MG RE SUPP: 650.0000 mg | Freq: Four times a day (QID) | RECTAL | Status: DC | PRN

## 2019-01-09 MED ORDER — LEVOTHYROXINE SODIUM 100 MCG PO TABS
100.0000 ug | ORAL_TABLET | Freq: Every day | ORAL | Status: DC
  Administered 2019-01-10: 100 ug via ORAL
  Filled 2019-01-09: qty 1

## 2019-01-09 MED ORDER — OCUVITE-LUTEIN PO CAPS
1.0000 | ORAL_CAPSULE | Freq: Two times a day (BID) | ORAL | Status: DC
  Administered 2019-01-09 – 2019-01-10 (×2): 1 via ORAL
  Filled 2019-01-09 (×3): qty 1

## 2019-01-09 MED ORDER — OCUVITE-LUTEIN PO CAPS: 1.0000 | ORAL_CAPSULE | Freq: Two times a day (BID) | ORAL | Status: DC

## 2019-01-09 MED ORDER — MAGNESIUM OXIDE 400 (241.3 MG) MG PO TABS
200.0000 mg | ORAL_TABLET | Freq: Every day | ORAL | Status: DC
  Administered 2019-01-10: 200 mg via ORAL
  Filled 2019-01-09: qty 1

## 2019-01-09 MED ORDER — POTASSIUM CHLORIDE CRYS ER 10 MEQ PO TBCR
10.0000 meq | EXTENDED_RELEASE_TABLET | Freq: Every day | ORAL | Status: DC
  Administered 2019-01-10: 10 meq via ORAL
  Filled 2019-01-09: qty 1

## 2019-01-09 MED ORDER — ONDANSETRON HCL 4 MG/2ML IJ SOLN: 4.0000 mg | Freq: Four times a day (QID) | INTRAMUSCULAR | Status: DC | PRN

## 2019-01-09 MED ORDER — PANTOPRAZOLE SODIUM 40 MG PO TBEC
40.0000 mg | DELAYED_RELEASE_TABLET | Freq: Every day | ORAL | Status: DC
  Administered 2019-01-10: 40 mg via ORAL
  Filled 2019-01-09: qty 1

## 2019-01-09 NOTE — ED Notes (Addendum)
[phone] Laura Walters- Daughter in [place]

## 2019-01-09 NOTE — ED Notes (Signed)
Assisted pt with toileting- pt had a BM

## 2019-01-09 NOTE — ED Provider Notes (Signed)
[facility] Emergency Department Provider Note  ____________________________________________  Time seen: Approximately 4:37 PM  I have reviewed the triage vital signs and the nursing notes.   HISTORY  Chief Complaint Loss of Consciousness    HPI Laura Walters is a 85 y.o. female who presents the emergency department via EMS for syncopal episode at home.  Patient has a history of multiple syncopal episodes.  Last ED encounter was 4 days ago for similar incident.  At that time, work-up was reassuring and the patient requested to be discharged home.  On 12/01/2018, patient had a pacemaker placed for repeated syncopal episodes.  3 days ago, pacemaker was queried with no evidence of arrhythmias.  Patient states that today she went to stand up to use the restroom when she felt presyncopal.  Patient was able to lay down prior to passing out.  She did not fall or sustain any musculoskeletal injuries.  Patient reports that after awaking, she has had no complaints of headache, neck pain or stiffness, chest pain, shortness of breath, abdominal pain, nausea or vomiting.  Patient states that she feels "fine" right now.  Patient with a significant medical history to include arthritis, thyroid cancer, cardiomyopathy, degenerative disc disease, GERD, hypothyroidism, hypertension, neuropathy and STEMI, bradycardia.       Past Medical History:  Diagnosis Date  . Arthritis   . Asthma   . Back pain   . Cancer of thyroid ([place]) 03/30/2015  . Cardiomyopathy ([place])    idiopathic  . Chest pain    non cardiac  . Chokes    easily   swallow test negative  . Constipation   . DDD (degenerative disc disease), lumbar   . Disc disorder   . GERD (gastroesophageal reflux disease)   . H/O wheezing    advair as needed  . Hypertension   . Hypothyroidism   . IBS (irritable bowel syndrome)   . Neuropathy   . Nocturia   . RAD (reactive airway disease)   . Rectocele   . Seasonal allergies    . Spinal stenosis   . Urge incontinence   . Vaginal atrophy   . Vaginal polyp     Patient Active Problem List   Diagnosis Date Noted  . Bradycardia, unspecified 11/30/2018  . Bradycardia 11/21/2018  . NSTEMI (non-ST elevated myocardial infarction) ([place]) 11/18/2018  . Syncope 11/03/2018  . Vaginal bleeding 08/02/2018  . Abrasion of vagina 08/02/2018  . Goals of care, counseling/discussion 01/05/2018  . Uterine prolapse 02/03/2017  . Pulmonary metastases ([place]) 04/09/2016  . RAD (reactive airway disease) 02/27/2016  . Recurrent sinus infections 01/31/2016  . H/O total knee replacement 01/20/2016  . S/P total knee arthroplasty 01/05/2016  . 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 Arthropathy, traumatic, knee 12/16/2015  . Cystocele, midline 12/02/2015  . Rectocele 12/02/2015  . Frequent UTI 11/26/2015  . Acquired hypothyroidism 10/13/2015  . Pituitary tumor 10/13/2015  . Degeneration of intervertebral disc of lumbar region 08/01/2015  . Lumbar canal stenosis 08/01/2015  . Neuritis or radiculitis due to rupture of lumbar intervertebral disc 08/01/2015  . LBP (low back pain) 03/30/2015  . Neuropathy ([place]) 03/30/2015  . Chest pain, non-cardiac 03/30/2015  . Neoplasm of pituitary gland 03/30/2015  . Cancer of thyroid ([place]) 03/30/2015  . Combined fat and carbohydrate induced hyperlipemia 10/17/2014  . Primary cardiomyopathy ([place]) 10/14/2014    Past Surgical History:  Procedure Laterality Date  . BREAST BIOPSY Right 86 and 92   2  bx-neg  . CATARACT EXTRACTION W/PHACO Right 05/19/2016   Procedure: CATARACT EXTRACTION PHACO AND INTRAOCULAR LENS PLACEMENT (IOC);  Surgeon: Estill Cotta, MD;  Location: [facility] ORS;  Service: Ophthalmology;  Laterality: Right;  Lot# [id number] H [place]:   1:25.3 AP%:  24.9% CDE:  40.11  . CATARACT EXTRACTION W/PHACO Left 08/11/2016   Procedure: CATARACT EXTRACTION PHACO AND INTRAOCULAR LENS PLACEMENT (IOC);  Surgeon: Estill Cotta, MD;  Location: [facility] ORS;  Service: Ophthalmology;  Laterality: Left;  [place] 1.24 AP% 26.1 CDE 37.79 FLUID PACK LOT # [id number]  . DILATION AND CURETTAGE OF UTERUS    . HEMORROIDECTOMY     x 2  . JOINT REPLACEMENT     left hip  . KNEE ARTHROPLASTY Right 01/05/2016   Procedure: COMPUTER ASSISTED TOTAL KNEE ARTHROPLASTY;  Surgeon: Dereck Leep, MD;  Location: [facility] ORS;  Service: Orthopedics;  Laterality: Right;  . KNEE ARTHROSCOPY Right 05/19/2015   Procedure: Right knee arthrosocpy medail and lateral menisectomy, chondroplasty ;  Surgeon: Dereck Leep, MD;  Location: [facility] ORS;  Service: Orthopedics;  Laterality: Right;  . Left total hip arthroplasty    . PACEMAKER INSERTION Left 12/01/2018   Procedure: INSERTION PACEMAKER Dual Chamber;  Surgeon: Isaias Cowman, MD;  Location: [facility] ORS;  Service: Cardiovascular;  Laterality: Left;  . PAROTIDECTOMY    . PITUITARY SURGERY    . THYROID SURGERY     bx  . THYROIDECTOMY      Prior to Admission medications   Medication Sig Start Date End Date Taking? Authorizing Provider  acetaminophen (TYLENOL) 325 MG tablet Take 650 mg by mouth daily.    [provider]  docusate sodium (COLACE) 100 MG capsule Take 100-200 mg by mouth 2 (two) times daily.     [provider]  DULoxetine (CYMBALTA) 60 MG capsule Take 60 mg by mouth daily.  02/02/16   [provider]  ferrous sulfate 325 (65 FE) MG tablet Take 325 mg by mouth daily with breakfast.     [provider]  gabapentin (NEURONTIN) 400 MG capsule Take 800 mg by mouth 2 (two) times daily.  02/09/16   [provider]  levothyroxine (SYNTHROID, LEVOTHROID) 100 MCG tablet Take 100 mcg by mouth daily. 100 mg for 6 day, than one-half tablet on 7th day (wed) 10/04/17   [provider]  levothyroxine (SYNTHROID, LEVOTHROID) 88 MCG tablet Take 88 mcg by mouth daily. 12/25/18   [provider]  MAGNESIUM PO Take 250 mg by mouth daily.      [provider]  Multiple Vitamins-Minerals (PRESERVISION AREDS 2) CAPS Take 1 capsule by mouth 2 (two) times daily.    [provider]  omeprazole (PRILOSEC) 40 MG capsule Take 40 mg by mouth 2 (two) times daily.  02/16/16   [provider]  potassium chloride (K-DUR,KLOR-CON) 10 MEQ tablet Take 10 mEq by mouth daily.     [provider]  traMADol (ULTRAM) 50 MG tablet Take 50 mg by mouth every 6 (six) hours as needed.    [provider]    Allergies Acetaminophen-codeine; Aleve [naproxen]; Penicillins; Sulfa antibiotics; and Codeine  Family History  Problem Relation Age of Onset  . Heart disease Father   . Heart attack Father   . Diabetes Paternal Uncle   . Cancer Neg Hx   . Breast cancer Neg Hx     Social History Social History   Tobacco Use  . Smoking status: Never Smoker  . Smokeless tobacco: Never Used  Substance  Use Topics  . Alcohol use: No  . Drug use: No     Review of Systems  Constitutional: No fever/chills Eyes: No visual changes. No discharge ENT: No upper respiratory complaints. Cardiovascular: no chest pain. Respiratory: no cough. No SOB. Gastrointestinal: No abdominal pain.  No nausea, no vomiting.  No diarrhea.  No constipation. Genitourinary: Negative for dysuria. No hematuria Musculoskeletal: Negative for musculoskeletal pain. Skin: Negative for rash, abrasions, lacerations, ecchymosis. Neurological: Negative for headaches, focal weakness or numbness.  Positive for syncopal episode. 10-point ROS otherwise negative.  ____________________________________________   PHYSICAL EXAM:  VITAL SIGNS: ED Triage Vitals  Enc Vitals Group     BP 01/09/19 1628 122/72     Pulse Rate 01/09/19 1628 60     Resp --      Temp 01/09/19 1628 98.1 F (36.7 C)     Temp Source 01/09/19 1628 Oral     SpO2 01/09/19 1628 97 %     Weight 01/09/19 1629 116 lb (52.6 kg)     Height 01/09/19 1629 5' (1.524 m)     Head  Circumference --      Peak Flow --      Pain Score 01/09/19 1628 0     Pain Loc --      Pain Edu? --      Excl. in [place]? --      Constitutional: Alert and oriented. Well appearing and in no acute distress. Eyes: Conjunctivae are normal. PERRL. EOMI. Head: Atraumatic. ENT:      Ears:       Nose: No congestion/rhinnorhea.      Mouth/Throat: Mucous membranes are moist.  Neck: No stridor.  No cervical spine tenderness to palpation.  Cardiovascular: Normal rate, regular rhythm. Normal S1 and S2.  Good peripheral circulation. Respiratory: Normal respiratory effort without tachypnea or retractions. Lungs CTAB. Good air entry to the bases with no decreased or absent breath sounds. Gastrointestinal: Bowel sounds 4 quadrants. Soft and nontender to palpation. No guarding or rigidity. No palpable masses. No distention. No CVA tenderness. Musculoskeletal: Full range of motion to all extremities. No gross deformities appreciated. Neurologic:  Normal speech and language. No gross focal neurologic deficits are appreciated.  Cranial nerves II through XII grossly intact.  Negative Romberg's or pronator drift. Skin:  Skin is warm, dry and intact. No rash noted. Psychiatric: Mood and affect are normal. Speech and behavior are normal. Patient exhibits appropriate insight and judgement.   ____________________________________________   LABS (all labs ordered are listed, but only abnormal results are displayed)  Labs Reviewed  URINALYSIS, COMPLETE (UACMP) WITH MICROSCOPIC - Abnormal; Notable for the following components:      Result Value   Color, Urine YELLOW (*)    APPearance HAZY (*)    Leukocytes,Ua SMALL (*)    All other components within normal limits  CBC WITH DIFFERENTIAL/PLATELET - Abnormal; Notable for the following components:   RBC 3.12 (*)    Hemoglobin 9.3 (*)    HCT 29.6 (*)    Eosinophils Absolute 0.6 (*)    All other components within normal limits  COMPREHENSIVE METABOLIC PANEL -  Abnormal; Notable for the following components:   Glucose, Bld 103 (*)    Calcium 7.2 (*)    Total Protein 5.7 (*)    Albumin 3.0 (*)    Total Bilirubin 0.2 (*)    All other components within normal limits  TROPONIN I   ____________________________________________  EKG   ____________________________________________  RADIOLOGY   No  results found.  ____________________________________________    PROCEDURES  Procedure(s) performed:    Procedures    Medications - No data to display   ____________________________________________   INITIAL IMPRESSION / ASSESSMENT AND PLAN / ED COURSE  Pertinent labs & imaging results that were available during my care of the patient were reviewed by me and considered in my medical decision making (see chart for details).  Review of the [place] CSRS was performed in accordance of the [place] prior to dispensing any controlled drugs.  Clinical Course as of Jan 09 1927  Tue Jan 09, 2019  1700 Patient presented to the emergency department with a complaint of syncope at home.  Patient has had multiple episodes of syncope, having a pacemaker placed a little over a month ago.  Patient was seen in this department 3 days ago for similar complaint.  She sustained no trauma today as she felt symptoms prior to losing consciousness.  Patient currently has no complaints.  I will be evaluate the patient with labs, EKG.  At this time, no indication for imaging.   [JC]    Clinical Course User Index [JC] Akila Batta, Charline Bills, PA-C          Patient's diagnosis is consistent with syncope and collapse.  Patient presented to the emergency department with a syncopal episode.  Patient has had multiple syncopal episodes over the past several months.  She had a pacemaker placed on 12/01/2018.  Since then she has had 3 repeated syncopal episodes.  Pacemaker was queried 4 days ago with no evidence of cardiac dysrhythmias.  Today, patient was ambulating to the  restroom when she felt near syncopal.  Patient laid down prior to syncope and suffered no trauma.  Overall, exam was reassuring as well as work-up.  Given multiple syncopal episodes including 2 within 4 days, patient was agreeable for admission for observation.  I discussed the case with hospitalist service who agrees to admit the patient.  Patient care will be transferred to the hospital service at this time..    ____________________________________________  FINAL CLINICAL IMPRESSION(S) / ED DIAGNOSES  Final diagnoses:  Syncope and collapse      NEW MEDICATIONS STARTED DURING THIS VISIT:  ED Discharge Orders    None          This chart was dictated using voice recognition software/Dragon. Despite best efforts to proofread, errors can occur which can change the meaning. Any change was purely unintentional.    Brynda Peon 01/09/19 1928    Carrie Mew, MD 01/09/19 2014

## 2019-01-09 NOTE — ED Notes (Addendum)
ED TO INPATIENT HANDOFF REPORT  ED Nurse Name and Phone #:   Gershon Mussel RN    [phone]  S Name/Age/Gender Stanton Kidney A Gonsalves 85 y.o. female Room/Bed: ED15A/ED15A  Code Status   Code Status: Prior  Home/SNF/Other Home Patient oriented to: self, place, time and situation Is this baseline? Yes   Triage Complete: Triage complete  Chief Complaint EMS  Triage Note Pt had a pacemaker placed 3 weeks ago for syncope- pt comes via EMS with a syncopal episode- EMS said she had a low bp- bp now is 116/66- pt denies hitting their head and states she laid down instead of fell- NAD noted   Allergies Allergies  Allergen Reactions  . Acetaminophen-Codeine Other (See Comments)  . Aleve [Naproxen] Hives    Causes dizziness  . Penicillins Hives    Has patient had a PCN reaction causing immediate rash, facial/tongue/throat swelling, SOB or lightheadedness with hypotension: hives Has patient had a PCN reaction causing severe rash involving mucus membranes or skin necrosis: no Has patient had a PCN reaction that required hospitalization no Has patient had a PCN reaction occurring within the last 10 years: no If all of the above answers are "NO", then may proceed with Cephalosporin use.  . Sulfa Antibiotics Hives  . Codeine Hives, Itching and Other (See Comments)    dizziness    Level of Care/Admitting Diagnosis ED Disposition    ED Disposition Condition [place] [facility] Area: [place] [[id number]]  Level of Care: Telemetry [5]  Diagnosis: Syncope and collapse [780.2.ICD-9-CM]  Admitting Physician: Saundra Shelling [[id number]]  Attending Physician: Saundra Shelling [[id number]]  Possible Covid Disease Patient Isolation: N/A  PT Class (Do Not Modify): Observation [104]  PT Acc Code (Do Not Modify): Observation [10022]       B Medical/Surgery History Past Medical History:  Diagnosis Date  . Arthritis   . Asthma   . Back pain   . Cancer of thyroid ([place]) 03/30/2015  .  Cardiomyopathy ([place])    idiopathic  . Chest pain    non cardiac  . Chokes    easily   swallow test negative  . Constipation   . DDD (degenerative disc disease), lumbar   . Disc disorder   . GERD (gastroesophageal reflux disease)   . H/O wheezing    advair as needed  . Hypertension   . Hypothyroidism   . IBS (irritable bowel syndrome)   . Neuropathy   . Nocturia   . RAD (reactive airway disease)   . Rectocele   . Seasonal allergies   . Spinal stenosis   . Urge incontinence   . Vaginal atrophy   . Vaginal polyp    Past Surgical History:  Procedure Laterality Date  . BREAST BIOPSY Right 86 and 92   2 bx-neg  . CATARACT EXTRACTION W/PHACO Right 05/19/2016   Procedure: CATARACT EXTRACTION PHACO AND INTRAOCULAR LENS PLACEMENT (IOC);  Surgeon: Estill Cotta, MD;  Location: [facility] ORS;  Service: Ophthalmology;  Laterality: Right;  Lot# [id number] H [place]:   1:25.3 AP%:  24.9% CDE:  40.11  . CATARACT EXTRACTION W/PHACO Left 08/11/2016   Procedure: CATARACT EXTRACTION PHACO AND INTRAOCULAR LENS PLACEMENT (IOC);  Surgeon: Estill Cotta, MD;  Location: [facility] ORS;  Service: Ophthalmology;  Laterality: Left;  [place] 1.24 AP% 26.1 CDE 37.79 FLUID PACK LOT # [id number]  . DILATION AND CURETTAGE OF UTERUS    . HEMORROIDECTOMY     x 2  . JOINT REPLACEMENT  left hip  . KNEE ARTHROPLASTY Right 01/05/2016   Procedure: COMPUTER ASSISTED TOTAL KNEE ARTHROPLASTY;  Surgeon: Dereck Leep, MD;  Location: [facility] ORS;  Service: Orthopedics;  Laterality: Right;  . KNEE ARTHROSCOPY Right 05/19/2015   Procedure: Right knee arthrosocpy medail and lateral menisectomy, chondroplasty ;  Surgeon: Dereck Leep, MD;  Location: [facility] ORS;  Service: Orthopedics;  Laterality: Right;  . Left total hip arthroplasty    . PACEMAKER INSERTION Left 12/01/2018   Procedure: INSERTION PACEMAKER Dual Chamber;  Surgeon: Isaias Cowman, MD;  Location: [facility] ORS;  Service: Cardiovascular;  Laterality: Left;  . PAROTIDECTOMY     . PITUITARY SURGERY    . THYROID SURGERY     bx  . THYROIDECTOMY       A IV Location/Drains/Wounds Patient Lines/Drains/Airways Status   Active Line/Drains/Airways    Name:   Placement date:   Placement time:   Site:   Days:   Peripheral IV 01/09/19 Left Forearm   01/09/19    1637    Forearm   less than 1   Incision (Closed) 12/01/18 Chest Left   12/01/18    1253     39          Intake/Output Last 24 hours No intake or output data in the 24 hours ending 01/09/19 2000  Labs/Imaging Results for orders placed or performed during the hospital encounter of 01/09/19 (from the past 48 hour(s))  CBC with Differential     Status: Abnormal   Collection Time: 01/09/19  4:31 PM  Result Value Ref Range   WBC 6.2 4.0 - 10.5 K/uL   RBC 3.12 (L) 3.87 - 5.11 MIL/uL   Hemoglobin 9.3 (L) 12.0 - 15.0 g/dL   HCT 29.6 (L) 36.0 - 46.0 %   MCV 94.9 80.0 - 100.0 fL   MCH 29.8 26.0 - 34.0 pg   MCHC 31.4 30.0 - 36.0 g/dL   RDW 13.6 11.5 - 15.5 %   Platelets 181 150 - 400 K/uL   nRBC 0.0 0.0 - 0.2 %   Neutrophils Relative % 60 %   Neutro Abs 3.7 1.7 - 7.7 K/uL   Lymphocytes Relative 18 %   Lymphs Abs 1.1 0.7 - 4.0 K/uL   Monocytes Relative 13 %   Monocytes Absolute 0.8 0.1 - 1.0 K/uL   Eosinophils Relative 9 %   Eosinophils Absolute 0.6 (H) 0.0 - 0.5 K/uL   Basophils Relative 0 %   Basophils Absolute 0.0 0.0 - 0.1 K/uL   Immature Granulocytes 0 %   Abs Immature Granulocytes 0.02 0.00 - 0.07 K/uL    Comment: Performed at [facility], [address] [place], [place] [address]  Comprehensive metabolic panel     Status: Abnormal   Collection Time: 01/09/19  4:31 PM  Result Value Ref Range   Sodium 139 135 - 145 mmol/L   Potassium 3.6 3.5 - 5.1 mmol/L   Chloride 106 98 - 111 mmol/L   CO2 23 22 - 32 mmol/L   Glucose, Bld 103 (H) 70 - 99 mg/dL   BUN 15 8 - 23 mg/dL   Creatinine, Ser 0.88 0.44 - 1.00 mg/dL   Calcium 7.2 (L) 8.9 - 10.3 mg/dL   Total Protein 5.7 (L) 6.5 - 8.1  g/dL   Albumin 3.0 (L) 3.5 - 5.0 g/dL   AST 18 15 - 41 U/L   ALT 10 0 - 44 U/L   Alkaline Phosphatase 97 38 - 126 U/L   Total  Bilirubin 0.2 (L) 0.3 - 1.2 mg/dL   GFR calc non Af Amer >60 >60 mL/min   GFR calc Af Amer >60 >60 mL/min   Anion gap 10 5 - 15    Comment: Performed at [facility], [address] [place], [place] [address]  Troponin I - Add-On to previous collection     Status: None   Collection Time: 01/09/19  4:31 PM  Result Value Ref Range   Troponin I <0.03 <0.03 ng/mL    Comment: Performed at [facility], [address] [place], [place] [address]  Urinalysis, Complete w Microscopic     Status: Abnormal   Collection Time: 01/09/19  4:33 PM  Result Value Ref Range   Color, Urine YELLOW (A) YELLOW   APPearance HAZY (A) CLEAR   Specific Gravity, Urine 1.011 1.005 - 1.030   pH 7.0 5.0 - 8.0   Glucose, UA NEGATIVE NEGATIVE mg/dL   Hgb urine dipstick NEGATIVE NEGATIVE   Bilirubin Urine NEGATIVE NEGATIVE   Ketones, ur NEGATIVE NEGATIVE mg/dL   Protein, ur NEGATIVE NEGATIVE mg/dL   Nitrite NEGATIVE NEGATIVE   Leukocytes,Ua SMALL (A) NEGATIVE   RBC / HPF 0-5 0 - 5 RBC/hpf   WBC, UA 11-20 0 - 5 WBC/hpf   Bacteria, UA NONE SEEN NONE SEEN   Squamous Epithelial / LPF 0-5 0 - 5   Mucus PRESENT    Hyaline Casts, UA PRESENT     Comment: Performed at [facility], [address] [place], [place] [address]   No results found.  Pending Labs [employer] (From admission, onward)    Start     Ordered   Signed and Held  CBC  (enoxaparin (LOVENOX)    CrCl >/= 30 ml/min)  Once,   R    Comments:  Baseline for enoxaparin therapy IF NOT ALREADY DRAWN.  Notify MD if PLT < 100 K.    Signed and Held   Signed and Held  Creatinine, serum  (enoxaparin (LOVENOX)    CrCl >/= 30 ml/min)  Once,   R    Comments:  Baseline for enoxaparin therapy IF NOT ALREADY DRAWN.    Signed and Held   Signed and Held  Creatinine, serum  (enoxaparin (LOVENOX)    CrCl  >/= 30 ml/min)  Weekly,   R    Comments:  while on enoxaparin therapy    Signed and Held   Signed and Held  Basic metabolic panel  Tomorrow morning,   R     Signed and Held          Vitals/Pain Today's Vitals   01/09/19 1700 01/09/19 1800 01/09/19 1830 01/09/19 1900  BP: (!) 155/74 (!) 164/81 (!) 157/83 (!) 157/82  Pulse: 62 65 68 69  Resp: 11 20 11 16   Temp:      TempSrc:      SpO2: 97% 99% 98% 99%  Weight:      Height:      PainSc:        Isolation Precautions No active isolations  Medications Medications - No data to display  Mobility walks with person assist High fall risk   Focused Assessments Cardiac Assessment Handoff:    Lab Results  Component Value Date   TROPONINI <0.03 01/09/2019   No results found for: DDIMER Does the Patient currently have chest pain? Yes     R Recommendations: See Admitting Provider Note  Report given to:   Lexi RN on 2A  Additional  Notes:

## 2019-01-09 NOTE — ED Triage Notes (Addendum)
Pt had a pacemaker placed 3 weeks ago for syncope- pt comes via EMS with a syncopal episode- EMS said she had a low bp- bp now is 116/66- pt denies hitting their head and states she laid down instead of fell- NAD noted

## 2019-01-09 NOTE — Progress Notes (Signed)
Advanced care plan. Purpose of the Encounter: CODE STATUS Parties in Attendance: Patient Patient's Decision Capacity: Good Subjective/Patient's story:  Laura Walters  is a 85 y.o. female with a known history of cardiomyopathy, degenerative disc disease, GERD hypertension, hypothyroidism, neuropathy, spinal stenosis, urge incontinence presented to the emergency room for loss of consciousness.  Objective/Medical story Patient recently had a pacemaker placed.  We will cycle troponins and monitor patient on telemetry and get cardiology follow-up.  Check carotid ultrasound Goals of care determination:  Advance care directives goals of care treatment plan discussed Patient wants everything done which includes CPR, intubation ventilator if the need arises CODE STATUS: Full code Time spent discussing advanced care planning: 16 minutes

## 2019-01-09 NOTE — H&P (Signed)
[facility] [place] NAME: Laura Walters    MR#:  [id number]  DATE OF BIRTH:  [birth year]  DATE OF ADMISSION:  01/09/2019  PRIMARY CARE PHYSICIAN: Idelle Crouch, MD   REQUESTING/REFERRING PHYSICIAN:   CHIEF COMPLAINT:   Chief Complaint  Patient presents with  . Loss of Consciousness    HISTORY OF PRESENT ILLNESS: Laura Walters  is a 85 y.o. female with a known history of cardiomyopathy, degenerative disc disease, GERD hypertension, hypothyroidism, neuropathy, spinal stenosis, urge incontinence presented to the emergency room for loss of consciousness.  Patient was recently worked up for syncope and had a pacemaker placed.  She follows up with [facility] clinic cardiology.  Echocardiogram from February was reviewed.  Troponins were negative, patient has generalized weakness.  PAST MEDICAL HISTORY:   Past Medical History:  Diagnosis Date  . Arthritis   . Asthma   . Back pain   . Cancer of thyroid ([place]) 03/30/2015  . Cardiomyopathy ([place])    idiopathic  . Chest pain    non cardiac  . Chokes    easily   swallow test negative  . Constipation   . DDD (degenerative disc disease), lumbar   . Disc disorder   . GERD (gastroesophageal reflux disease)   . H/O wheezing    advair as needed  . Hypertension   . Hypothyroidism   . IBS (irritable bowel syndrome)   . Neuropathy   . Nocturia   . RAD (reactive airway disease)   . Rectocele   . Seasonal allergies   . Spinal stenosis   . Urge incontinence   . Vaginal atrophy   . Vaginal polyp     PAST SURGICAL HISTORY:  Past Surgical History:  Procedure Laterality Date  . BREAST BIOPSY Right 86 and 92   2 bx-neg  . CATARACT EXTRACTION W/PHACO Right 05/19/2016   Procedure: CATARACT EXTRACTION PHACO AND INTRAOCULAR LENS PLACEMENT (IOC);  Surgeon: Estill Cotta, MD;  Location: [facility] ORS;  Service: Ophthalmology;  Laterality: Right;  Lot# [id number] H [place]:   1:25.3 AP%:  24.9% CDE:  40.11  .  CATARACT EXTRACTION W/PHACO Left 08/11/2016   Procedure: CATARACT EXTRACTION PHACO AND INTRAOCULAR LENS PLACEMENT (IOC);  Surgeon: Estill Cotta, MD;  Location: [facility] ORS;  Service: Ophthalmology;  Laterality: Left;  [place] 1.24 AP% 26.1 CDE 37.79 FLUID PACK LOT # [id number]  . DILATION AND CURETTAGE OF UTERUS    . HEMORROIDECTOMY     x 2  . JOINT REPLACEMENT     left hip  . KNEE ARTHROPLASTY Right 01/05/2016   Procedure: COMPUTER ASSISTED TOTAL KNEE ARTHROPLASTY;  Surgeon: Dereck Leep, MD;  Location: [facility] ORS;  Service: Orthopedics;  Laterality: Right;  . KNEE ARTHROSCOPY Right 05/19/2015   Procedure: Right knee arthrosocpy medail and lateral menisectomy, chondroplasty ;  Surgeon: Dereck Leep, MD;  Location: [facility] ORS;  Service: Orthopedics;  Laterality: Right;  . Left total hip arthroplasty    . PACEMAKER INSERTION Left 12/01/2018   Procedure: INSERTION PACEMAKER Dual Chamber;  Surgeon: Isaias Cowman, MD;  Location: [facility] ORS;  Service: Cardiovascular;  Laterality: Left;  . PAROTIDECTOMY    . PITUITARY SURGERY    . THYROID SURGERY     bx  . THYROIDECTOMY      SOCIAL HISTORY:  Social History   Tobacco Use  . Smoking status: Never Smoker  . Smokeless tobacco: Never Used  Substance Use Topics  . Alcohol use: No    FAMILY  HISTORY:  Family History  Problem Relation Age of Onset  . Heart disease Father   . Heart attack Father   . Diabetes Paternal Uncle   . Cancer Neg Hx   . Breast cancer Neg Hx     DRUG ALLERGIES:  Allergies  Allergen Reactions  . Acetaminophen-Codeine Other (See Comments)  . Aleve [Naproxen] Hives    Causes dizziness  . Penicillins Hives    Has patient had a PCN reaction causing immediate rash, facial/tongue/throat swelling, SOB or lightheadedness with hypotension: hives Has patient had a PCN reaction causing severe rash involving mucus membranes or skin necrosis: no Has patient had a PCN reaction that required hospitalization no Has patient  had a PCN reaction occurring within the last 10 years: no If all of the above answers are "NO", then may proceed with Cephalosporin use.  . Sulfa Antibiotics Hives  . Codeine Hives, Itching and Other (See Comments)    dizziness    REVIEW OF SYSTEMS:   CONSTITUTIONAL: No fever, fatigue or weakness.  EYES: No blurred or double vision.  EARS, NOSE, AND THROAT: No tinnitus or ear pain.  RESPIRATORY: No cough, shortness of breath, wheezing or hemoptysis.  CARDIOVASCULAR: No chest pain, orthopnea, edema.  GASTROINTESTINAL: No nausea, vomiting, diarrhea or abdominal pain.  GENITOURINARY: No dysuria, hematuria.  ENDOCRINE: No polyuria, nocturia,  HEMATOLOGY: No anemia, easy bruising or bleeding SKIN: No rash or lesion. MUSCULOSKELETAL: No joint pain or arthritis.   NEUROLOGIC: No tingling, numbness, weakness.  Passed out PSYCHIATRY: No anxiety or depression.   MEDICATIONS AT HOME:  Prior to Admission medications   Medication Sig Start Date End Date Taking? Authorizing Provider  docusate sodium (COLACE) 100 MG capsule Take 100-200 mg by mouth 2 (two) times daily.    Yes [provider]  DULoxetine (CYMBALTA) 60 MG capsule Take 60 mg by mouth daily.  02/02/16  Yes [provider]  ferrous sulfate 325 (65 FE) MG tablet Take 325 mg by mouth daily with breakfast.    Yes [provider]  gabapentin (NEURONTIN) 400 MG capsule Take 800 mg by mouth 2 (two) times daily.  02/09/16  Yes [provider]  levothyroxine (SYNTHROID, LEVOTHROID) 88 MCG tablet Take 88 mcg by mouth daily. 12/25/18  Yes [provider]  MAGNESIUM PO Take 250 mg by mouth daily.    Yes [provider]  Multiple Vitamins-Minerals (PRESERVISION AREDS 2) CAPS Take 1 capsule by mouth 2 (two) times daily.   Yes [provider]  omeprazole (PRILOSEC) 40 MG capsule Take 40 mg by mouth 2 (two) times daily.  02/16/16  Yes [provider]  potassium chloride  (K-DUR,KLOR-CON) 10 MEQ tablet Take 10 mEq by mouth daily.    Yes [provider]  acetaminophen (TYLENOL) 325 MG tablet Take 650 mg by mouth daily.    [provider]  traMADol (ULTRAM) 50 MG tablet Take 50 mg by mouth every 6 (six) hours as needed.    [provider]      PHYSICAL EXAMINATION:   VITAL SIGNS: Blood pressure (!) 157/82, pulse 69, temperature 98.1 F (36.7 C), temperature source Oral, resp. rate 16, height 5' (1.524 m), weight 52.6 kg, SpO2 99 %.  GENERAL:  85 y.o.-year-old patient lying in the bed with no acute distress.  EYES: Pupils equal, round, reactive to light and accommodation. No scleral icterus. Extraocular muscles intact.  HEENT: Head atraumatic, normocephalic. Oropharynx and nasopharynx clear.  NECK:  Supple, no jugular venous distention. No thyroid  enlargement, no tenderness.  LUNGS: Normal breath sounds bilaterally, no wheezing, rales,rhonchi or crepitation. No use of accessory muscles of respiration.  CARDIOVASCULAR: S1, S2 normal. No murmurs, rubs, or gallops.  ABDOMEN: Soft, nontender, nondistended. Bowel sounds present. No organomegaly or mass.  EXTREMITIES: No pedal edema, cyanosis, or clubbing.  NEUROLOGIC: Cranial nerves II through XII are intact. Muscle strength 5/5 in all extremities. Sensation intact. Gait not checked.  PSYCHIATRIC: The patient is alert and oriented x 3.  SKIN: No obvious rash, lesion, or ulcer.   LABORATORY PANEL:   CBC Recent Labs  Lab 01/06/19 1229 01/09/19 1631  WBC 7.8 6.2  HGB 10.8* 9.3*  HCT 34.1* 29.6*  PLT 205 181  MCV 93.4 94.9  MCH 29.6 29.8  MCHC 31.7 31.4  RDW 13.4 13.6  LYMPHSABS 1.4 1.1  MONOABS 0.9 0.8  EOSABS 0.6* 0.6*  BASOSABS 0.0 0.0   ------------------------------------------------------------------------------------------------------------------  Chemistries  Recent Labs  Lab 01/06/19 1229 01/09/19 1631  NA 137 139  K 4.2 3.6  CL 104 106  CO2 26 23  GLUCOSE  104* 103*  BUN 13 15  CREATININE 0.86 0.88  CALCIUM 8.0* 7.2*  AST 21 18  ALT 11 10  ALKPHOS 121 97  BILITOT 0.6 0.2*   ------------------------------------------------------------------------------------------------------------------ estimated creatinine clearance is 34.8 mL/min (by C-G formula based on SCr of 0.88 mg/dL). ------------------------------------------------------------------------------------------------------------------ No results for input(s): TSH, T4TOTAL, T3FREE, THYROIDAB in the last 72 hours.  Invalid input(s): FREET3   Coagulation profile No results for input(s): INR, PROTIME in the last 168 hours. ------------------------------------------------------------------------------------------------------------------- No results for input(s): DDIMER in the last 72 hours. -------------------------------------------------------------------------------------------------------------------  Cardiac Enzymes Recent Labs  Lab 01/06/19 1229 01/06/19 1516 01/09/19 1631  TROPONINI <0.03 <0.03 <0.03   ------------------------------------------------------------------------------------------------------------------ Invalid input(s): POCBNP  ---------------------------------------------------------------------------------------------------------------  Urinalysis    Component Value Date/Time   COLORURINE YELLOW (A) 01/09/2019 1633   APPEARANCEUR HAZY (A) 01/09/2019 1633   APPEARANCEUR Clear 01/08/2015 1357   LABSPEC 1.011 01/09/2019 1633   LABSPEC 1.008 01/08/2015 1357   PHURINE 7.0 01/09/2019 1633   GLUCOSEU NEGATIVE 01/09/2019 1633   GLUCOSEU Negative 01/08/2015 1357   HGBUR NEGATIVE 01/09/2019 1633   BILIRUBINUR NEGATIVE 01/09/2019 1633   BILIRUBINUR neg 04/06/2018 1040   BILIRUBINUR Negative 01/08/2015 [place] 01/09/2019 1633   PROTEINUR NEGATIVE 01/09/2019 1633   UROBILINOGEN 0.2 04/06/2018 1040   NITRITE NEGATIVE 01/09/2019 1633    LEUKOCYTESUR SMALL (A) 01/09/2019 1633   LEUKOCYTESUR Negative 01/08/2015 1357     RADIOLOGY: No results found.  EKG: Orders placed or performed during the hospital encounter of 01/09/19  . ED EKG  . ED EKG    IMPRESSION AND PLAN: 85 year old female patient with a known history of cardiomyopathy, degenerative disc disease, GERD hypertension, hypothyroidism, neuropathy, spinal stenosis, urge incontinence presented to the emergency room for loss of consciousness.   -Syncope Observation bed to telemetry Cycle troponin Cardiology follow-up Recent echo reviewed Check carotid ultrasound  -Cardiomyopathy Medical management  -GERD Proton pump inhibitor to continue  -DVT prophylaxis subcu Lovenox daily  All the records are reviewed and case discussed with ED provider. Management plans discussed with the patient, family and they are in agreement.  CODE STATUS:Full code Code Status History    Date Active Date Inactive Code Status Order ID Comments User Context   11/30/2018 0973 12/02/2018 1523 Full Code [id number]  Gorden Harms, MD Inpatient   11/21/2018 1905 11/22/2018 1837 Full Code [id number]  [name], MD Inpatient   11/03/2018 [phone] 11/04/2018  [place] DNR [id number]  [name], MD Inpatient   05/19/2015 1612 05/19/2015 2013 Full Code [id number]  Dereck Leep, MD Inpatient    Advance Directive Documentation     Most Recent Value  Type of Advance Directive  Healthcare Power of Attorney, Living will  Pre-existing out of facility DNR order (yellow form or pink MOST form)  -  "MOST" Form in Place?  -       TOTAL TIME TAKING CARE OF THIS PATIENT: 52 minutes.    Saundra Shelling M.D on 01/09/2019 at 7:41 PM  Between 7am to 6pm - Pager - [phone]  After 6pm go to [email or web address] - password [email or web address] [facility]  Office  [phone]  CC: Primary care physician; Idelle Crouch, MD

## 2019-01-09 NOTE — ED Notes (Signed)
Pt given phone to make a phone call

## 2019-01-10 LAB — BASIC METABOLIC PANEL
Anion gap: 10 (ref 5–15)
BUN: 17 mg/dL (ref 8–23)
CO2: 22 mmol/L (ref 22–32)
Calcium: 7.9 mg/dL — ABNORMAL LOW (ref 8.9–10.3)
Chloride: 106 mmol/L (ref 98–111)
Creatinine, Ser: 0.69 mg/dL (ref 0.44–1.00)
GFR calc Af Amer: >60 mL/min (ref >60)
GFR calc non Af Amer: >60 mL/min (ref >60)
Glucose, Bld: 99 mg/dL (ref 70–99)
Potassium: 4.4 mmol/L (ref 3.5–5.1)
Sodium: 138 mmol/L (ref 135–145)

## 2019-01-10 LAB — GLUCOSE, CAPILLARY
Glucose-Capillary: 95 mg/dL (ref 70–99)
Glucose-Capillary: 97 mg/dL (ref 70–99)

## 2019-01-10 MED ORDER — LORATADINE 10 MG PO TABS
10.0000 mg | ORAL_TABLET | Freq: Every day | ORAL | Status: DC
  Administered 2019-01-10: 10 mg via ORAL
  Filled 2019-01-10: qty 1

## 2019-01-10 NOTE — TOC Transition Note (Signed)
Transition of Care [facility]) - CM/SW Discharge Note   Patient Details  Name: Laura Walters MRN: [id number] Date of Birth: [birth year]  [facility] CM/SW Contact:  Elza Rafter, RN Phone Number: 01/10/2019, 10:25 AM   Clinical Narrative:  Patient is from home alone; has 24 hour caregivers.  Discharging today.  Readmitted with syncope.  Current with PCP, Dr. Doy Hutching.  Obtains medications at [place] without difficulty.  She uses a cane as needed.  Drives occasionally; Son or daughter take her to appointments.   Offered home health services but patient refuses.  No oxygen at home.  Son will transport to home today.  No further needs identified by [facility].         Final next level of care: Home/Self Care Barriers to Discharge: No Barriers Identified   Patient Goals and CMS Choice        Discharge Placement                       Discharge Plan and Services   Discharge Planning Services: CM Consult                HH Arranged: Patient Refused [facility]     Social Determinants of Health (SDOH) Interventions     Readmission Risk Interventions Readmission Risk Prevention Plan 01/10/2019  Transportation Screening Complete  Medication Review Press photographer) Complete  PCP or Specialist appointment within 3-5 days of discharge Complete  HRI or [place] Complete  SW Recovery Care/Counseling Consult Patient refused  Palliative Care Screening Not [place] Not Applicable  Some recent data might be hidden

## 2019-01-10 NOTE — Progress Notes (Signed)
Pt wanting something for decongestion. Pt takes allegra at home, MD paged, Dr. Sidney Ace gave verbal orders for allegra 180mg  PO daily, however we do not carry allegra here so it substituted it for Claritin  10mg  daily. Conley [place]s, RN, BSN

## 2019-01-10 NOTE — Progress Notes (Signed)
Pt discharged to home via wc.  Instructions  given to pt.  Questions answered.  No distress.  

## 2019-01-10 NOTE — Discharge Summary (Signed)
[facility] at [place] NAME: Laura Walters    MR#:  [id number]  DATE OF BIRTH:  Aug 23, 1933  DATE OF ADMISSION:  01/09/2019 ADMITTING PHYSICIAN: Saundra Shelling, MD  DATE OF DISCHARGE: 01/10/2019 10:47 AM  PRIMARY CARE PHYSICIAN: Idelle Crouch, MD    ADMISSION DIAGNOSIS:  Syncope and collapse [R55]  DISCHARGE DIAGNOSIS:  Active Problems:   Syncope and collapse   SECONDARY DIAGNOSIS:   Past Medical History:  Diagnosis Date  . Arthritis   . Asthma   . Back pain   . Cancer of thyroid ([place]) 03/30/2015  . Cardiomyopathy ([place])    idiopathic  . Chest pain    non cardiac  . Chokes    easily   swallow test negative  . Constipation   . DDD (degenerative disc disease), lumbar   . Disc disorder   . GERD (gastroesophageal reflux disease)   . H/O wheezing    advair as needed  . Hypertension   . Hypothyroidism   . IBS (irritable bowel syndrome)   . Neuropathy   . Nocturia   . RAD (reactive airway disease)   . Rectocele   . Seasonal allergies   . Spinal stenosis   . Urge incontinence   . Vaginal atrophy   . Vaginal polyp     HOSPITAL COURSE:  85 year old female with a history of cardiomyopathy, DJD and hypothyroidism who presented to the emergency room due to syncope.  1.  Syncope: Patient was recently worked up for syncope and had a pacemaker placed.  Her heart rate is in sinus rhythm in the low 60s with intermittent atrial pacing at 60.  There is been no arrhythmia on telemetry monitoring.  She was followed by cardiology.  She is currently asymptomatic.  Troponins were negative.  Etiology of syncope is from orthostatic hypotension as she had a syncopal episode while getting out of bed quickly to go to the bathroom.  2.  Depression: Continue Cymbalta  3.  Iron deficiency: Continue ferrous sulfate  4.  GERD: Continue PPI  She would benefit from home health services due to the syncopal episodes.Marland Kitchen  DISCHARGE CONDITIONS AND DIET:    Stable for discharge on regular diet  CONSULTS OBTAINED:  Treatment Team:  Teodoro Spray, MD  DRUG ALLERGIES:   Allergies  Allergen Reactions  . Acetaminophen-Codeine Other (See Comments)  . Aleve [Naproxen] Hives    Causes dizziness  . Penicillins Hives    Has patient had a PCN reaction causing immediate rash, facial/tongue/throat swelling, SOB or lightheadedness with hypotension: hives Has patient had a PCN reaction causing severe rash involving mucus membranes or skin necrosis: no Has patient had a PCN reaction that required hospitalization no Has patient had a PCN reaction occurring within the last 10 years: no If all of the above answers are "NO", then may proceed with Cephalosporin use.  . Sulfa Antibiotics Hives  . Codeine Hives, Itching and Other (See Comments)    dizziness    DISCHARGE MEDICATIONS:   Allergies as of 01/10/2019      Reactions   Acetaminophen-codeine Other (See Comments)   Aleve [naproxen] Hives   Causes dizziness   Penicillins Hives   Has patient had a PCN reaction causing immediate rash, facial/tongue/throat swelling, SOB or lightheadedness with hypotension: hives Has patient had a PCN reaction causing severe rash involving mucus membranes or skin necrosis: no Has patient had a PCN reaction that required hospitalization no Has patient had a PCN reaction  occurring within the last 10 years: no If all of the above answers are "NO", then may proceed with Cephalosporin use.   Sulfa Antibiotics Hives   Codeine Hives, Itching, Other (See Comments)   dizziness      Medication List    TAKE these medications   acetaminophen 325 MG tablet Commonly known as:  TYLENOL Take 650 mg by mouth daily.   docusate sodium 100 MG capsule Commonly known as:  COLACE Take 100-200 mg by mouth 2 (two) times daily.   DULoxetine 60 MG capsule Commonly known as:  CYMBALTA Take 60 mg by mouth daily.   ferrous sulfate 325 (65 FE) MG tablet Take 325 mg by mouth  daily with breakfast.   gabapentin 400 MG capsule Commonly known as:  NEURONTIN Take 800 mg by mouth 2 (two) times daily.   levothyroxine 88 MCG tablet Commonly known as:  SYNTHROID, LEVOTHROID Take 88 mcg by mouth daily.   MAGNESIUM PO Take 250 mg by mouth daily.   omeprazole 40 MG capsule Commonly known as:  PRILOSEC Take 40 mg by mouth 2 (two) times daily.   potassium chloride 10 MEQ tablet Commonly known as:  K-DUR,KLOR-CON Take 10 mEq by mouth daily.   PreserVision AREDS 2 Caps Take 1 capsule by mouth 2 (two) times daily.   traMADol 50 MG tablet Commonly known as:  ULTRAM Take 50 mg by mouth every 6 (six) hours as needed.         Today   CHIEF COMPLAINT:   No acute events overnight.  Patient is doing well this morning.  She would like to go home.   VITAL SIGNS:  Blood pressure (!) 149/71, pulse 66, temperature 98.2 F (36.8 C), temperature source Oral, resp. rate 19, height 5' (1.524 m), weight 53.2 kg, SpO2 99 %.   REVIEW OF SYSTEMS:  Review of Systems  Constitutional: Negative.  Negative for chills, fever and malaise/fatigue.  HENT: Negative.  Negative for ear discharge, ear pain, hearing loss, nosebleeds and sore throat.   Eyes: Negative.  Negative for blurred vision and pain.  Respiratory: Negative.  Negative for cough, hemoptysis, shortness of breath and wheezing.   Cardiovascular: Negative.  Negative for chest pain, palpitations and leg swelling.  Gastrointestinal: Negative.  Negative for abdominal pain, blood in stool, diarrhea, nausea and vomiting.  Genitourinary: Negative.  Negative for dysuria.  Musculoskeletal: Negative.  Negative for back pain.  Skin: Negative.   Neurological: Negative for dizziness, tremors, speech change, focal weakness, seizures and headaches.  Endo/Heme/Allergies: Negative.  Does not bruise/bleed easily.  Psychiatric/Behavioral: Negative.  Negative for depression, hallucinations and suicidal ideas.     PHYSICAL  EXAMINATION:  GENERAL:  85 y.o.-year-old patient lying in the bed with no acute distress.  NECK:  Supple, no jugular venous distention. No thyroid enlargement, no tenderness.  LUNGS: Normal breath sounds bilaterally, no wheezing, rales,rhonchi  No use of accessory muscles of respiration.  CARDIOVASCULAR: S1, S2 normal. No murmurs, rubs, or gallops.  ABDOMEN: Soft, non-tender, non-distended. Bowel sounds present. No organomegaly or mass.  EXTREMITIES: No pedal edema, cyanosis, or clubbing.  PSYCHIATRIC: The patient is alert and oriented x 3.  SKIN: No obvious rash, lesion, or ulcer.   DATA REVIEW:   CBC Recent Labs  Lab 01/09/19 1631  WBC 6.2  HGB 9.3*  HCT 29.6*  PLT 181    Chemistries  Recent Labs  Lab 01/09/19 1631 01/10/19 0457  NA 139 138  K 3.6 4.4  CL 106 106  CO2  23 22  GLUCOSE 103* 99  BUN 15 17  CREATININE 0.88 0.69  CALCIUM 7.2* 7.9*  AST 18  --   ALT 10  --   ALKPHOS 97  --   BILITOT 0.2*  --     Cardiac Enzymes Recent Labs  Lab 01/06/19 1229 01/06/19 1516 01/09/19 1631  TROPONINI <0.03 <0.03 <0.03    Microbiology Results  @MICRORSLT48 @  RADIOLOGY:  No results found.    Allergies as of 01/10/2019      Reactions   Acetaminophen-codeine Other (See Comments)   Aleve [naproxen] Hives   Causes dizziness   Penicillins Hives   Has patient had a PCN reaction causing immediate rash, facial/tongue/throat swelling, SOB or lightheadedness with hypotension: hives Has patient had a PCN reaction causing severe rash involving mucus membranes or skin necrosis: no Has patient had a PCN reaction that required hospitalization no Has patient had a PCN reaction occurring within the last 10 years: no If all of the above answers are "NO", then may proceed with Cephalosporin use.   Sulfa Antibiotics Hives   Codeine Hives, Itching, Other (See Comments)   dizziness      Medication List    TAKE these medications   acetaminophen 325 MG tablet Commonly known  as:  TYLENOL Take 650 mg by mouth daily.   docusate sodium 100 MG capsule Commonly known as:  COLACE Take 100-200 mg by mouth 2 (two) times daily.   DULoxetine 60 MG capsule Commonly known as:  CYMBALTA Take 60 mg by mouth daily.   ferrous sulfate 325 (65 FE) MG tablet Take 325 mg by mouth daily with breakfast.   gabapentin 400 MG capsule Commonly known as:  NEURONTIN Take 800 mg by mouth 2 (two) times daily.   levothyroxine 88 MCG tablet Commonly known as:  SYNTHROID, LEVOTHROID Take 88 mcg by mouth daily.   MAGNESIUM PO Take 250 mg by mouth daily.   omeprazole 40 MG capsule Commonly known as:  PRILOSEC Take 40 mg by mouth 2 (two) times daily.   potassium chloride 10 MEQ tablet Commonly known as:  K-DUR,KLOR-CON Take 10 mEq by mouth daily.   PreserVision AREDS 2 Caps Take 1 capsule by mouth 2 (two) times daily.   traMADol 50 MG tablet Commonly known as:  ULTRAM Take 50 mg by mouth every 6 (six) hours as needed.          Management plans discussed with the patient and she is in agreement. Stable for discharge home  Patient should follow up with pcp  CODE STATUS:     Code Status Orders  (From admission, onward)         Start     Ordered   01/09/19 2150  Full code  Continuous     01/09/19 2149        Code Status History    Date Active Date Inactive Code Status Order ID Comments User Context   11/30/2018 1631 12/02/2018 1523 Full Code [id number]  Gorden Harms, MD Inpatient   11/21/2018 1905 11/22/2018 1837 Full Code [id number]  [name], MD Inpatient   11/03/2018 1849 11/04/2018 1804 DNR [id number]  [name], MD Inpatient   05/19/2015 1612 05/19/2015 2013 Full Code [id number]  Hooten, Laurice Record, MD Inpatient    Advance Directive Documentation     Most Recent Value  Type of Advance Directive  Healthcare Power of Attorney, Living will  Pre-existing out of facility DNR order (yellow form or pink MOST form)  -  "  MOST" Form in Place?   -      TOTAL TIME TAKING CARE OF THIS PATIENT: 38 minutes.    Note: This dictation was prepared with Dragon dictation along with smaller phrase technology. Any transcriptional errors that result from this process are unintentional.  Bettey Costa M.D on 01/10/2019 at 12:28 PM  Between 7am to 6pm - Pager - [phone] After 6pm go to [email or web address] - password [email or web address] [facility]  Office  [phone]  CC: Primary care physician; Idelle Crouch, MD

## 2019-01-10 NOTE — Consult Note (Signed)
Cardiology Consultation Note    Patient ID: Laura Walters, MRN: [id number], DOB/AGE: [birth year] 85 y.o. Admit date: 01/09/2019   Date of Consult: 01/10/2019 Primary Physician: Idelle Crouch, MD Primary Cardiologist: Dr. Ubaldo Glassing  Chief Complaint: syncope Reason for Consultation: syncope Requesting MD: Dr. Benjie Karvonen  HPI: Laura Walters is a 85 y.o. female with history of  cardiomyopathy, degenerative disc disease, GERD hypertension, hypothyroidism, neuropathy, spinal stenosis, urge incontinence presented to the emergency room for loss of consciousness.  Patient was recently worked up for syncope and had a pacemaker placed.  She has had intermittent episodes of hypo-tension.  On this event she had gotten up from bed to go to the bathroom and felt lightheaded followed by a syncopal episode.  Her pacemaker is functioning normally.  She has a heart rate of sinus rhythm in the low 60s with intermittent atrial pacing at 60 bpm which is her backup rate.  There is been no arrhythmia.  She has been relatively hypertensive during admission.  She is asymptomatic and desires to go home.  Past Medical History:  Diagnosis Date  . Arthritis   . Asthma   . Back pain   . Cancer of thyroid ([place]) 03/30/2015  . Cardiomyopathy ([place])    idiopathic  . Chest pain    non cardiac  . Chokes    easily   swallow test negative  . Constipation   . DDD (degenerative disc disease), lumbar   . Disc disorder   . GERD (gastroesophageal reflux disease)   . H/O wheezing    advair as needed  . Hypertension   . Hypothyroidism   . IBS (irritable bowel syndrome)   . Neuropathy   . Nocturia   . RAD (reactive airway disease)   . Rectocele   . Seasonal allergies   . Spinal stenosis   . Urge incontinence   . Vaginal atrophy   . Vaginal polyp       Surgical History:  Past Surgical History:  Procedure Laterality Date  . BREAST BIOPSY Right 86 and 92   2 bx-neg  . CATARACT EXTRACTION W/PHACO Right 05/19/2016   Procedure:  CATARACT EXTRACTION PHACO AND INTRAOCULAR LENS PLACEMENT (IOC);  Surgeon: Estill Cotta, MD;  Location: [facility] ORS;  Service: Ophthalmology;  Laterality: Right;  Lot# [id number] H [place]:   1:25.3 AP%:  24.9% CDE:  40.11  . CATARACT EXTRACTION W/PHACO Left 08/11/2016   Procedure: CATARACT EXTRACTION PHACO AND INTRAOCULAR LENS PLACEMENT (IOC);  Surgeon: Estill Cotta, MD;  Location: [facility] ORS;  Service: Ophthalmology;  Laterality: Left;  [place] 1.24 AP% 26.1 CDE 37.79 FLUID PACK LOT # [id number]  . DILATION AND CURETTAGE OF UTERUS    . HEMORROIDECTOMY     x 2  . JOINT REPLACEMENT     left hip  . KNEE ARTHROPLASTY Right 01/05/2016   Procedure: COMPUTER ASSISTED TOTAL KNEE ARTHROPLASTY;  Surgeon: Dereck Leep, MD;  Location: [facility] ORS;  Service: Orthopedics;  Laterality: Right;  . KNEE ARTHROSCOPY Right 05/19/2015   Procedure: Right knee arthrosocpy medail and lateral menisectomy, chondroplasty ;  Surgeon: Dereck Leep, MD;  Location: [facility] ORS;  Service: Orthopedics;  Laterality: Right;  . Left total hip arthroplasty    . PACEMAKER INSERTION Left 12/01/2018   Procedure: INSERTION PACEMAKER Dual Chamber;  Surgeon: Isaias Cowman, MD;  Location: [facility] ORS;  Service: Cardiovascular;  Laterality: Left;  . PAROTIDECTOMY    . PITUITARY SURGERY    . THYROID SURGERY     bx  .  THYROIDECTOMY       Home Meds: Prior to Admission medications   Medication Sig Start Date End Date Taking? Authorizing Provider  docusate sodium (COLACE) 100 MG capsule Take 100-200 mg by mouth 2 (two) times daily.    Yes [provider]  DULoxetine (CYMBALTA) 60 MG capsule Take 60 mg by mouth daily.  02/02/16  Yes [provider]  ferrous sulfate 325 (65 FE) MG tablet Take 325 mg by mouth daily with breakfast.    Yes [provider]  gabapentin (NEURONTIN) 400 MG capsule Take 800 mg by mouth 2 (two) times daily.  02/09/16  Yes [provider]  levothyroxine (SYNTHROID, LEVOTHROID) 88 MCG  tablet Take 88 mcg by mouth daily. 12/25/18  Yes [provider]  MAGNESIUM PO Take 250 mg by mouth daily.    Yes [provider]  Multiple Vitamins-Minerals (PRESERVISION AREDS 2) CAPS Take 1 capsule by mouth 2 (two) times daily.   Yes [provider]  omeprazole (PRILOSEC) 40 MG capsule Take 40 mg by mouth 2 (two) times daily.  02/16/16  Yes [provider]  potassium chloride (K-DUR,KLOR-CON) 10 MEQ tablet Take 10 mEq by mouth daily.    Yes [provider]  acetaminophen (TYLENOL) 325 MG tablet Take 650 mg by mouth daily.    [provider]  traMADol (ULTRAM) 50 MG tablet Take 50 mg by mouth every 6 (six) hours as needed.    [provider]    Inpatient Medications:  . docusate sodium  100-200 mg Oral BID  . DULoxetine  60 mg Oral Daily  . enoxaparin (LOVENOX) injection  40 mg Subcutaneous Q24H  . ferrous sulfate  325 mg Oral Q breakfast  . gabapentin  800 mg Oral BID  . levothyroxine  100 mcg Oral QAC breakfast  . loratadine  10 mg Oral Daily  . magnesium oxide  200 mg Oral Daily  . multivitamin-lutein  1 capsule Oral BID  . pantoprazole  40 mg Oral Daily  . potassium chloride  10 mEq Oral Daily  . sodium chloride flush  3 mL Intravenous Q12H  . sodium chloride flush  3 mL Intravenous Q12H   . sodium chloride      Allergies:  Allergies  Allergen Reactions  . Acetaminophen-Codeine Other (See Comments)  . Aleve [Naproxen] Hives    Causes dizziness  . Penicillins Hives    Has patient had a PCN reaction causing immediate rash, facial/tongue/throat swelling, SOB or lightheadedness with hypotension: hives Has patient had a PCN reaction causing severe rash involving mucus membranes or skin necrosis: no Has patient had a PCN reaction that required hospitalization no Has patient had a PCN reaction occurring within the last 10 years: no If all of the above answers are "NO", then may proceed with Cephalosporin use.  . Sulfa  Antibiotics Hives  . Codeine Hives, Itching and Other (See Comments)    dizziness    Social History   Socioeconomic History  . Marital status: Widowed    Spouse name: Not on file  . Number of children: Not on file  . Years of education: Not on file  . Highest education level: Not on file  Occupational History  . Not on file  Social Needs  . Financial resource strain: Not on file  . Food insecurity:    Worry: Not on file    Inability: Not on file  . Transportation needs:    Medical: Not on file    Non-medical: Not  on file  Tobacco Use  . Smoking status: Never Smoker  . Smokeless tobacco: Never Used  Substance and Sexual Activity  . Alcohol use: No  . Drug use: No  . Sexual activity: Not Currently    Birth control/protection: Post-menopausal  Lifestyle  . Physical activity:    Days per week: Not on file    Minutes per session: Not on file  . Stress: Not on file  Relationships  . Social connections:    Talks on phone: Not on file    Gets together: Not on file    Attends religious service: Not on file    Active member of club or organization: Not on file    Attends meetings of clubs or organizations: Not on file    Relationship status: Not on file  . Intimate partner violence:    Fear of current or ex partner: Not on file    Emotionally abused: Not on file    Physically abused: Not on file    Forced sexual activity: Not on file  Other Topics Concern  . Not on file  Social History Narrative  . Not on file     Family History  Problem Relation Age of Onset  . Heart disease Father   . Heart attack Father   . Diabetes Paternal Uncle   . Cancer Neg Hx   . Breast cancer Neg Hx      Review of Systems: A 12-system review of systems was performed and is negative except as noted in the HPI.  Labs: Recent Labs    01/09/19 1631  TROPONINI <0.03   Lab Results  Component Value Date   WBC 6.2 01/09/2019   HGB 9.3 (L) 01/09/2019   HCT 29.6 (L) 01/09/2019   MCV  94.9 01/09/2019   PLT 181 01/09/2019    Recent Labs  Lab 01/09/19 1631 01/10/19 0457  NA 139 138  K 3.6 4.4  CL 106 106  CO2 23 22  BUN 15 17  CREATININE 0.88 0.69  CALCIUM 7.2* 7.9*  PROT 5.7*  --   BILITOT 0.2*  --   ALKPHOS 97  --   ALT 10  --   AST 18  --   GLUCOSE 103* 99   No results found for: CHOL, HDL, LDLCALC, TRIG No results found for: DDIMER  Radiology/Studies:  Dg Chest 2 View  Result Date: 01/06/2019 CLINICAL DATA:  New pacemaker insert 11-30-18, now with episodes (today) of syncope. Non smoker, tyroid Ca 2016 EXAM: CHEST - 2 VIEW COMPARISON:  Chest x-ray dated 12/02/2018. FINDINGS: LEFT chest wall pacemaker leads appear appropriately positioned. Heart size and mediastinal contours are within normal limits. Rounded nodule at the LEFT lung base, corresponding to the LEFT lower lobe pulmonary nodule measuring 1.7 cm on chest CT of 11/03/2018. The other smaller pulmonary nodules demonstrated in both lungs on earlier chest CT are not able to be characterized by chest x-ray. No new lung findings. No pleural effusion or pneumothorax seen. IMPRESSION: 1. LEFT chest wall pacemaker appears appropriately positioned. No pleural effusion or pneumothorax seen. No evidence of pneumonia or pulmonary edema. 2. Pulmonary nodule at the LEFT lung base, corresponding to the LEFT lower lobe 1.7 cm pulmonary nodule demonstrated on chest CT of 11/03/2018, compatible with known metastases per the accompanying chest CT report. Electronically Signed   By: Franki Cabot M.D.   On: 01/06/2019 14:56    Wt Readings from Last 3 Encounters:  01/10/19 53.2 kg  01/06/19 52.6 kg  12/01/18 52.9 kg    EKG: Normal sinus rhythm  Physical Exam:  Blood pressure (!) 156/88, pulse 63, temperature 97.8 F (36.6 C), temperature source Oral, resp. rate 16, height 5' (1.524 m), weight 53.2 kg, SpO2 100 %. Body mass index is 22.91 kg/m. General: Well developed, well nourished, in no acute distress. Head:  Normocephalic, atraumatic, sclera non-icteric, no xanthomas, nares are without discharge.  Neck: Negative for carotid bruits. JVD not elevated. Lungs: Clear bilaterally to auscultation without wheezes, rales, or rhonchi. Breathing is unlabored. Heart: RRR with S1 S2. No murmurs, rubs, or gallops appreciated. Abdomen: Soft, non-tender, non-distended with normoactive bowel sounds. No hepatomegaly. No rebound/guarding. No obvious abdominal masses. Msk:  Strength and tone appear normal for age. Extremities: No clubbing or cyanosis. No edema.  Distal pedal pulses are 2+ and equal bilaterally. Neuro: Alert and oriented X 3. No facial asymmetry. No focal deficit. Moves all extremities spontaneously. Psych:  Responds to questions appropriately with a normal affect.     Assessment and Plan  85 year old female with history of syncopal episodes with transient bradycardia in the past.  Possible etiology of her syncope was felt to be bradycardia and she had a permanent pacemaker placed.  She presented with what appears to be an orthostatic syncopal episode after getting out of bed quickly to go the bathroom.  She has been in sinus rhythm or atrial paced since admission.  She has been hemodynamically stable.  She has no chest pain.  Laboratories including troponin were unremarkable.  Patient had no chest pain.  Electrolytes were normal.  Chest x-ray showed no acute cardiopulmonary disease.  Etiology event appears to be orthostatic hypotension.  Patient is normotensive to hypertensive in the hospital.  He has had an extensive cardiac work-up.  Does not appear to be ischemia or tachyarrhythmias.  Would recommend consideration for discharge today with outpatient follow-up in our office in 1 week.  Would continue with current medical regimen.  Signed, Teodoro Spray MD 01/10/2019, 7:33 AM Pager: [phone]

## 2019-01-24 ENCOUNTER — Other Ambulatory Visit
Admission: RE | Admit: 2019-01-24 | Discharge: 2019-01-24 | Disposition: A | Discharge status: Home / Self Care | Payer: Medicare Other | Source: Ambulatory Visit | Attending: Endocrinology | Admitting: Endocrinology

## 2019-01-24 ENCOUNTER — Other Ambulatory Visit: Payer: Self-pay

## 2019-01-24 DIAGNOSIS — D352 Benign neoplasm of pituitary gland: Secondary | ICD-10-CM | POA: Diagnosis present

## 2019-01-24 LAB — CORTISOL: Cortisol, Plasma: 13.4 ug/dL

## 2019-01-24 LAB — T4, FREE: Free T4: 1.06 ng/dL (ref 0.82–1.77)

## 2019-01-24 LAB — TSH: TSH: 0.081 u[IU]/mL — ABNORMAL LOW (ref 0.350–4.500)

## 2019-01-25 ENCOUNTER — Emergency Department
Admission: EM | Admit: 2019-01-25 | Discharge: 2019-01-25 | Disposition: A | Discharge status: Home / Self Care | Payer: Medicare Other | Attending: Emergency Medicine | Admitting: Emergency Medicine

## 2019-01-25 ENCOUNTER — Other Ambulatory Visit: Payer: Self-pay

## 2019-01-25 DIAGNOSIS — J45909 Unspecified asthma, uncomplicated: Secondary | ICD-10-CM | POA: Insufficient documentation

## 2019-01-25 DIAGNOSIS — R55 Syncope and collapse: Secondary | ICD-10-CM | POA: Insufficient documentation

## 2019-01-25 DIAGNOSIS — I10 Essential (primary) hypertension: Secondary | ICD-10-CM | POA: Diagnosis not present

## 2019-01-25 DIAGNOSIS — Z79899 Other long term (current) drug therapy: Secondary | ICD-10-CM | POA: Insufficient documentation

## 2019-01-25 LAB — CBC WITH DIFFERENTIAL/PLATELET
Abs Immature Granulocytes: 0.03 10*3/uL (ref 0.00–0.07)
Basophils Absolute: 0 10*3/uL (ref 0.0–0.1)
Basophils Relative: 0 %
Eosinophils Absolute: 0.9 10*3/uL — ABNORMAL HIGH (ref 0.0–0.5)
Eosinophils Relative: 10 %
HCT: 37.3 % (ref 36.0–46.0)
Hemoglobin: 11.8 g/dL — ABNORMAL LOW (ref 12.0–15.0)
Immature Granulocytes: 0 %
Lymphocytes Relative: 17 %
Lymphs Abs: 1.5 10*3/uL (ref 0.7–4.0)
MCH: 29.7 pg (ref 26.0–34.0)
MCHC: 31.6 g/dL (ref 30.0–36.0)
MCV: 94 fL (ref 80.0–100.0)
Monocytes Absolute: 1.1 10*3/uL — ABNORMAL HIGH (ref 0.1–1.0)
Monocytes Relative: 12 %
Neutro Abs: 5.3 10*3/uL (ref 1.7–7.7)
Neutrophils Relative %: 61 %
Platelets: 196 10*3/uL (ref 150–400)
RBC: 3.97 MIL/uL (ref 3.87–5.11)
RDW: 13.2 % (ref 11.5–15.5)
WBC: 8.8 10*3/uL (ref 4.0–10.5)
nRBC: 0 % (ref 0.0–0.2)

## 2019-01-25 LAB — COMPREHENSIVE METABOLIC PANEL
ALT: 13 U/L (ref 0–44)
AST: 22 U/L (ref 15–41)
Albumin: 3.6 g/dL (ref 3.5–5.0)
Alkaline Phosphatase: 120 U/L (ref 38–126)
Anion gap: 8 (ref 5–15)
BUN: 18 mg/dL (ref 8–23)
CO2: 24 mmol/L (ref 22–32)
Calcium: 8.3 mg/dL — ABNORMAL LOW (ref 8.9–10.3)
Chloride: 102 mmol/L (ref 98–111)
Creatinine, Ser: 0.9 mg/dL (ref 0.44–1.00)
GFR calc Af Amer: >60 mL/min (ref >60)
GFR calc non Af Amer: 59 mL/min — ABNORMAL LOW (ref >60)
Glucose, Bld: 89 mg/dL (ref 70–99)
Potassium: 4.1 mmol/L (ref 3.5–5.1)
Sodium: 134 mmol/L — ABNORMAL LOW (ref 135–145)
Total Bilirubin: 0.6 mg/dL (ref 0.3–1.2)
Total Protein: 6.9 g/dL (ref 6.5–8.1)

## 2019-01-25 LAB — TROPONIN I: Troponin I: <0.03 ng/mL (ref <0.03)

## 2019-01-25 LAB — ACTH: C206 ACTH: 20.9 pg/mL (ref 7.2–63.3)

## 2019-01-25 LAB — FSH/LH
FSH: 34.9 m[IU]/mL
LH: 14.7 m[IU]/mL

## 2019-01-25 MED ORDER — SODIUM CHLORIDE 0.9 % IV BOLUS
500.0000 mL | Freq: Once | INTRAVENOUS | Status: AC
  Administered 2019-01-25: 500 mL via INTRAVENOUS

## 2019-01-25 NOTE — ED Notes (Signed)
Pt called family member to come and pick her up - they will be here at 1pm

## 2019-01-25 NOTE — ED Provider Notes (Signed)
[facility] Emergency Department Provider Note  Time seen: 11:49 AM  I have reviewed the triage vital signs and the nursing notes.   HISTORY  Chief Complaint Loss of Consciousness   HPI Laura Walters is a 85 y.o. female with a past medical history of arthritis, back pain, gastric reflux, hypertension, syncope, presents to the emergency department for syncope/near syncope.  According to the patient she was sitting in her car in a parking lot when she began feeling unwell describes it as a nauseous feeling and became very sweaty.  Patient has a history of multiple syncopal episodes in the past which this felt identical.  Patient states that this happened probably 4 or 5 times over the past several months.  Patient had a pacemaker placed 2 months ago due to her recurrent syncopal episodes however it did not help and she continued to have syncopal episodes.  Patient denies any chest pain at any point.  No fever, no cough or shortness of breath.  Denies any abdominal pain.  Patient has a largely negative review of systems.     Past Medical History:  Diagnosis Date  . Arthritis   . Asthma   . Back pain   . Cancer of thyroid ([place]) 03/30/2015  . Cardiomyopathy ([place])    idiopathic  . Chest pain    non cardiac  . Chokes    easily   swallow test negative  . Constipation   . DDD (degenerative disc disease), lumbar   . Disc disorder   . GERD (gastroesophageal reflux disease)   . H/O wheezing    advair as needed  . Hypertension   . Hypothyroidism   . IBS (irritable bowel syndrome)   . Neuropathy   . Nocturia   . RAD (reactive airway disease)   . Rectocele   . Seasonal allergies   . Spinal stenosis   . Urge incontinence   . Vaginal atrophy   . Vaginal polyp     Patient Active Problem List   Diagnosis Date Noted  . Syncope and collapse 01/09/2019  . Bradycardia, unspecified 11/30/2018  . Bradycardia 11/21/2018  . NSTEMI (non-ST elevated myocardial infarction)  ([place]) 11/18/2018  . Syncope 11/03/2018  . Vaginal bleeding 08/02/2018  . Abrasion of vagina 08/02/2018  . Goals of care, counseling/discussion 01/05/2018  . Uterine prolapse 02/03/2017  . Pulmonary metastases ([place]) 04/09/2016  . RAD (reactive airway disease) 02/27/2016  . Recurrent sinus infections 01/31/2016  . H/O total knee replacement 01/20/2016  . S/P total knee arthroplasty 01/05/2016  . 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 Arthropathy, traumatic, knee 12/16/2015  . Cystocele, midline 12/02/2015  . Rectocele 12/02/2015  . Frequent UTI 11/26/2015  . Acquired hypothyroidism 10/13/2015  . Pituitary tumor 10/13/2015  . Degeneration of intervertebral disc of lumbar region 08/01/2015  . Lumbar canal stenosis 08/01/2015  . Neuritis or radiculitis due to rupture of lumbar intervertebral disc 08/01/2015  . LBP (low back pain) 03/30/2015  . Neuropathy ([place]) 03/30/2015  . Chest pain, non-cardiac 03/30/2015  . Neoplasm of pituitary gland 03/30/2015  . Cancer of thyroid ([place]) 03/30/2015  . Combined fat and carbohydrate induced hyperlipemia 10/17/2014  . Primary cardiomyopathy ([place]) 10/14/2014    Past Surgical History:  Procedure Laterality Date  . BREAST BIOPSY Right 86 and 92   2 bx-neg  . CATARACT EXTRACTION W/PHACO Right 05/19/2016   Procedure: CATARACT EXTRACTION PHACO AND INTRAOCULAR LENS PLACEMENT (IOC);  Surgeon: Estill Cotta, MD;  Location: [facility]  ORS;  Service: Ophthalmology;  Laterality: Right;  Lot# [id number] H [place]:   1:25.3 AP%:  24.9% CDE:  40.11  . CATARACT EXTRACTION W/PHACO Left 08/11/2016   Procedure: CATARACT EXTRACTION PHACO AND INTRAOCULAR LENS PLACEMENT (IOC);  Surgeon: Estill Cotta, MD;  Location: [facility] ORS;  Service: Ophthalmology;  Laterality: Left;  [place] 1.24 AP% 26.1 CDE 37.79 FLUID PACK LOT # [id number]  . DILATION AND CURETTAGE OF UTERUS    . HEMORROIDECTOMY     x 2  . JOINT REPLACEMENT     left hip  . KNEE ARTHROPLASTY  Right 01/05/2016   Procedure: COMPUTER ASSISTED TOTAL KNEE ARTHROPLASTY;  Surgeon: Dereck Leep, MD;  Location: [facility] ORS;  Service: Orthopedics;  Laterality: Right;  . KNEE ARTHROSCOPY Right 05/19/2015   Procedure: Right knee arthrosocpy medail and lateral menisectomy, chondroplasty ;  Surgeon: Dereck Leep, MD;  Location: [facility] ORS;  Service: Orthopedics;  Laterality: Right;  . Left total hip arthroplasty    . PACEMAKER INSERTION Left 12/01/2018   Procedure: INSERTION PACEMAKER Dual Chamber;  Surgeon: Isaias Cowman, MD;  Location: [facility] ORS;  Service: Cardiovascular;  Laterality: Left;  . PAROTIDECTOMY    . PITUITARY SURGERY    . THYROID SURGERY     bx  . THYROIDECTOMY      Prior to Admission medications   Medication Sig Start Date End Date Taking? Authorizing Provider  acetaminophen (TYLENOL) 325 MG tablet Take 650 mg by mouth daily.    [provider]  docusate sodium (COLACE) 100 MG capsule Take 100-200 mg by mouth 2 (two) times daily.     [provider]  DULoxetine (CYMBALTA) 60 MG capsule Take 60 mg by mouth daily.  02/02/16   [provider]  ferrous sulfate 325 (65 FE) MG tablet Take 325 mg by mouth daily with breakfast.     [provider]  gabapentin (NEURONTIN) 400 MG capsule Take 800 mg by mouth 2 (two) times daily.  02/09/16   [provider]  levothyroxine (SYNTHROID, LEVOTHROID) 88 MCG tablet Take 88 mcg by mouth daily. 12/25/18   [provider]  MAGNESIUM PO Take 250 mg by mouth daily.     [provider]  Multiple Vitamins-Minerals (PRESERVISION AREDS 2) CAPS Take 1 capsule by mouth 2 (two) times daily.    [provider]  omeprazole (PRILOSEC) 40 MG capsule Take 40 mg by mouth 2 (two) times daily.  02/16/16   [provider]  potassium chloride (K-DUR,KLOR-CON) 10 MEQ tablet Take 10 mEq by mouth daily.     [provider]  traMADol (ULTRAM) 50 MG tablet Take 50 mg by mouth every 6  (six) hours as needed.    [provider]    Allergies  Allergen Reactions  . Acetaminophen-Codeine Other (See Comments)  . Aleve [Naproxen] Hives    Causes dizziness  . Penicillins Hives    Has patient had a PCN reaction causing immediate rash, facial/tongue/throat swelling, SOB or lightheadedness with hypotension: hives Has patient had a PCN reaction causing severe rash involving mucus membranes or skin necrosis: no Has patient had a PCN reaction that required hospitalization no Has patient had a PCN reaction occurring within the last 10 years: no If all of the above answers are "NO", then may proceed with Cephalosporin use.  . Sulfa Antibiotics Hives  . Codeine Hives, Itching and Other (See Comments)    dizziness    Family History  Problem Relation Age of Onset  . Heart disease Father   .  Heart attack Father   . Diabetes Paternal Uncle   . Cancer Neg Hx   . Breast cancer Neg Hx     Social History Social History   Tobacco Use  . Smoking status: Never Smoker  . Smokeless tobacco: Never Used  Substance Use Topics  . Alcohol use: No  . Drug use: No    Review of Systems Constitutional: Negative for fever. Cardiovascular: Negative for chest pain. Respiratory: Negative for shortness of breath.  Negative for cough. Gastrointestinal: Negative for abdominal pain.  Positive nausea earlier, no vomiting.  Now resolved. Genitourinary: Negative for urinary compaints Musculoskeletal: Negative for musculoskeletal complaints Skin: Positive for sweaty skin earlier, now resolved. Neurological: Negative for headache All other ROS negative  ____________________________________________   PHYSICAL EXAM:  VITAL SIGNS: ED Triage Vitals  Enc Vitals Group     BP 01/25/19 1136 (!) 162/81     Pulse Rate 01/25/19 1132 70     Resp 01/25/19 1132 20     Temp 01/25/19 1132 97.7 F (36.5 C)     Temp Source 01/25/19 1132 Oral     SpO2 01/25/19 1132 97 %     Weight 01/25/19  1146 117 lb (53.1 kg)     Height 01/25/19 1146 5' (1.524 m)     Head Circumference --      Peak Flow --      Pain Score 01/25/19 1133 0     Pain Loc --      Pain Edu? --      Excl. in [place]? --    Constitutional: Alert and oriented. Well appearing and in no distress. Eyes: Normal exam ENT      Head: Normocephalic and atraumatic.      Mouth/Throat: Mucous membranes are moist. Cardiovascular: Normal rate, regular rhythm. Respiratory: Normal respiratory effort without tachypnea nor retractions. Breath sounds are clear  Gastrointestinal: Soft and nontender. No distention.  Musculoskeletal: Nontender with normal range of motion in all extremities.  Neurologic:  Normal speech and language. No gross focal neurologic deficits  Skin:  Skin is warm, dry and intact.  Psychiatric: Mood and affect are normal.   ____________________________________________    EKG  EKG viewed and interpreted by myself shows an atrial paced rhythm at 66 bpm with a narrow QRS, normal axis, normal intervals, no concerning ST changes.  ____________________________________________   INITIAL IMPRESSION / ASSESSMENT AND PLAN / ED COURSE  Pertinent labs & imaging results that were available during my care of the patient were reviewed by me and considered in my medical decision making (see chart for details).   Patient presents emergency department for a near syncopal episode.  Differential would include near syncope, syncope, dehydration, hypovolemia, arrhythmia.  We will check labs, IV hydrate and continue to closely monitor.  Patient had a [employer] pacemaker placed 2 months ago we will attempt pacemaker interrogation.  Patient states she feels normal at this time.  EMS states initial blood pressure in the 60s current blood pressure 162/81.  Patient's work-up is reassuring.  Lab work is largely within normal limits including a negative troponin.  Patient's EKG is reassuring.  I discussed the patient with [employer],  they detected no abnormalities on pacemaker.  Patient states this has been an ongoing issue.  We will discharge the patient with PCP/cardiology follow-up.  Patient agreeable to plan of care.  States she feels normal.  Kayren Eaves Gamino was evaluated in Emergency Department on 01/25/2019 for the symptoms described in the history of present illness.  She was evaluated in the context of the global COVID-19 pandemic, which necessitated consideration that the patient might be at risk for infection with the SARS-CoV-2 virus that causes COVID-19. Institutional protocols and algorithms that pertain to the evaluation of patients at risk for COVID-19 are in a state of rapid change based on information released by regulatory bodies including the CDC and federal and state organizations. These policies and algorithms were followed during the patient's care in the ED.  ____________________________________________   FINAL CLINICAL IMPRESSION(S) / ED DIAGNOSES  Near syncope   Harvest Dark, MD 01/25/19 1231

## 2019-01-25 NOTE — ED Triage Notes (Signed)
Pt arrives via EMS after having a syncopal episode in her car- pt has a history of syncope- she had a demand pacemaker placed about 2 months ago- bp for ems was 66/32- bp currently 146/89- pt states she is on 3 bp meds- NAD noted

## 2019-02-09 ENCOUNTER — Other Ambulatory Visit: Payer: Self-pay | Admitting: Radiology

## 2019-02-09 DIAGNOSIS — D352 Benign neoplasm of pituitary gland: Secondary | ICD-10-CM

## 2019-02-14 ENCOUNTER — Ambulatory Visit: Payer: Medicare Other | Admitting: Hematology and Oncology

## 2019-02-14 ENCOUNTER — Other Ambulatory Visit: Payer: Medicare Other

## 2019-02-16 ENCOUNTER — Other Ambulatory Visit (HOSPITAL_COMMUNITY): Payer: Self-pay | Admitting: Radiology

## 2019-02-16 DIAGNOSIS — D352 Benign neoplasm of pituitary gland: Secondary | ICD-10-CM

## 2019-02-17 ENCOUNTER — Emergency Department
Admission: EM | Admit: 2019-02-17 | Discharge: 2019-02-17 | Disposition: A | Discharge status: Home / Self Care | Payer: Medicare Other | Attending: Emergency Medicine | Admitting: Emergency Medicine

## 2019-02-17 ENCOUNTER — Other Ambulatory Visit: Payer: Self-pay

## 2019-02-17 DIAGNOSIS — N3001 Acute cystitis with hematuria: Secondary | ICD-10-CM | POA: Diagnosis not present

## 2019-02-17 DIAGNOSIS — J45909 Unspecified asthma, uncomplicated: Secondary | ICD-10-CM | POA: Insufficient documentation

## 2019-02-17 DIAGNOSIS — R55 Syncope and collapse: Secondary | ICD-10-CM | POA: Diagnosis present

## 2019-02-17 DIAGNOSIS — R42 Dizziness and giddiness: Secondary | ICD-10-CM | POA: Diagnosis not present

## 2019-02-17 DIAGNOSIS — I10 Essential (primary) hypertension: Secondary | ICD-10-CM | POA: Insufficient documentation

## 2019-02-17 DIAGNOSIS — Z79899 Other long term (current) drug therapy: Secondary | ICD-10-CM | POA: Diagnosis not present

## 2019-02-17 LAB — CBC WITH DIFFERENTIAL/PLATELET
Abs Immature Granulocytes: 0.05 10*3/uL (ref 0.00–0.07)
Basophils Absolute: 0 10*3/uL (ref 0.0–0.1)
Basophils Relative: 0 %
Eosinophils Absolute: 0.9 10*3/uL — ABNORMAL HIGH (ref 0.0–0.5)
Eosinophils Relative: 13 %
HCT: 36.6 % (ref 36.0–46.0)
Hemoglobin: 11.8 g/dL — ABNORMAL LOW (ref 12.0–15.0)
Immature Granulocytes: 1 %
Lymphocytes Relative: 16 %
Lymphs Abs: 1.1 10*3/uL (ref 0.7–4.0)
MCH: 29.5 pg (ref 26.0–34.0)
MCHC: 32.2 g/dL (ref 30.0–36.0)
MCV: 91.5 fL (ref 80.0–100.0)
Monocytes Absolute: 0.7 10*3/uL (ref 0.1–1.0)
Monocytes Relative: 11 %
Neutro Abs: 4.2 10*3/uL (ref 1.7–7.7)
Neutrophils Relative %: 59 %
Platelets: 241 10*3/uL (ref 150–400)
RBC: 4 MIL/uL (ref 3.87–5.11)
RDW: 12.8 % (ref 11.5–15.5)
WBC: 7 10*3/uL (ref 4.0–10.5)
nRBC: 0 % (ref 0.0–0.2)

## 2019-02-17 LAB — URINALYSIS, COMPLETE (UACMP) WITH MICROSCOPIC
Bilirubin Urine: NEGATIVE
Glucose, UA: NEGATIVE mg/dL
Ketones, ur: NEGATIVE mg/dL
Nitrite: POSITIVE — AB
Protein, ur: NEGATIVE mg/dL
Specific Gravity, Urine: 1.004 — ABNORMAL LOW (ref 1.005–1.030)
WBC, UA: >50 WBC/hpf — ABNORMAL HIGH (ref 0–5)
pH: 7 (ref 5.0–8.0)

## 2019-02-17 LAB — TROPONIN I: Troponin I: <0.03 ng/mL (ref <0.03)

## 2019-02-17 LAB — BASIC METABOLIC PANEL
Anion gap: 8 (ref 5–15)
BUN: 17 mg/dL (ref 8–23)
CO2: 23 mmol/L (ref 22–32)
Calcium: 8 mg/dL — ABNORMAL LOW (ref 8.9–10.3)
Chloride: 106 mmol/L (ref 98–111)
Creatinine, Ser: 0.79 mg/dL (ref 0.44–1.00)
GFR calc Af Amer: >60 mL/min (ref >60)
GFR calc non Af Amer: >60 mL/min (ref >60)
Glucose, Bld: 95 mg/dL (ref 70–99)
Potassium: 3.8 mmol/L (ref 3.5–5.1)
Sodium: 137 mmol/L (ref 135–145)

## 2019-02-17 LAB — GLUCOSE, CAPILLARY: Glucose-Capillary: 81 mg/dL (ref 70–99)

## 2019-02-17 MED ORDER — CEPHALEXIN 500 MG PO CAPS
500.0000 mg | ORAL_CAPSULE | Freq: Once | ORAL | Status: AC
  Administered 2019-02-17: 500 mg via ORAL
  Filled 2019-02-17: qty 1

## 2019-02-17 MED ORDER — CEPHALEXIN 500 MG PO CAPS: 500.0000 mg | ORAL_CAPSULE | Freq: Three times a day (TID) | ORAL | 0 refills | Status: AC

## 2019-02-17 MED ORDER — SODIUM CHLORIDE 0.9 % IV BOLUS
1000.0000 mL | Freq: Once | INTRAVENOUS | Status: AC
  Administered 2019-02-17: 11:00:00 1000 mL via INTRAVENOUS

## 2019-02-17 NOTE — ED Provider Notes (Signed)
[facility] Emergency Department Provider Note  ____________________________________________  Time seen: Approximately 12:49 PM  I have reviewed the triage vital signs and the nursing notes.   HISTORY  Chief Complaint Loss of Consciousness   HPI Laura Walters is a 85 y.o. female with a history of syncope, idiopathic cardiomyopathy, asthma who presents for evaluation of near syncopal episode.  Patient has had several similar episodes in the past.  Has been admitted several times to the hospital.  Has undergone extensive evaluation by cardiology and neurology.  She received a pacemaker for concerns of symptomatic bradycardia.  She is on midodrine for concerns of orthostatic hypotension. She also underwent a EEG which is concerning that these episodes could be possible seizures.  She is currently on Keppra.  Today she was sitting at the breakfast table when she started feeling "funny".  She reports that her mind feels foggy and she has a left-sided mild headache with it.  She feels like she is going to pass out.  Her caregiver assisted her to the floor.  When EMS arrived patient was orthostatics with BP of 116 laying flat and 59 standing up.  At this time patient reports that she feels back to her baseline.  She denies headache, chest pain, dizziness, weakness or numbness.  She denies feeling sick, she denies diarrhea, vomiting, dysuria, fever, shortness of breath, or cough.  Past Medical History:  Diagnosis Date  . Arthritis   . Asthma   . Back pain   . Cancer of thyroid ([place]) 03/30/2015  . Cardiomyopathy ([place])    idiopathic  . Chest pain    non cardiac  . Chokes    easily   swallow test negative  . Constipation   . DDD (degenerative disc disease), lumbar   . Disc disorder   . GERD (gastroesophageal reflux disease)   . H/O wheezing    advair as needed  . Hypertension   . Hypothyroidism   . IBS (irritable bowel syndrome)   . Neuropathy   . Nocturia   . RAD  (reactive airway disease)   . Rectocele   . Seasonal allergies   . Spinal stenosis   . Urge incontinence   . Vaginal atrophy   . Vaginal polyp     Patient Active Problem List   Diagnosis Date Noted  . Syncope and collapse 01/09/2019  . Bradycardia, unspecified 11/30/2018  . Bradycardia 11/21/2018  . NSTEMI (non-ST elevated myocardial infarction) ([place]) 11/18/2018  . Syncope 11/03/2018  . Vaginal bleeding 08/02/2018  . Abrasion of vagina 08/02/2018  . Goals of care, counseling/discussion 01/05/2018  . Uterine prolapse 02/03/2017  . Pulmonary metastases ([place]) 04/09/2016  . RAD (reactive airway disease) 02/27/2016  . Recurrent sinus infections 01/31/2016  . H/O total knee replacement 01/20/2016  . S/P total knee arthroplasty 01/05/2016  . 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 Arthropathy, traumatic, knee 12/16/2015  . Cystocele, midline 12/02/2015  . Rectocele 12/02/2015  . Frequent UTI 11/26/2015  . Acquired hypothyroidism 10/13/2015  . Pituitary tumor 10/13/2015  . Degeneration of intervertebral disc of lumbar region 08/01/2015  . Lumbar canal stenosis 08/01/2015  . Neuritis or radiculitis due to rupture of lumbar intervertebral disc 08/01/2015  . LBP (low back pain) 03/30/2015  . Neuropathy ([place]) 03/30/2015  . Chest pain, non-cardiac 03/30/2015  . Neoplasm of pituitary gland 03/30/2015  . Cancer of thyroid ([place]) 03/30/2015  . Combined fat and carbohydrate induced hyperlipemia 10/17/2014  . Primary cardiomyopathy ([place])  10/14/2014    Past Surgical History:  Procedure Laterality Date  . BREAST BIOPSY Right 86 and 92   2 bx-neg  . CATARACT EXTRACTION W/PHACO Right 05/19/2016   Procedure: CATARACT EXTRACTION PHACO AND INTRAOCULAR LENS PLACEMENT (IOC);  Surgeon: Estill Cotta, MD;  Location: [facility] ORS;  Service: Ophthalmology;  Laterality: Right;  Lot# [id number] H [place]:   1:25.3 AP%:  24.9% CDE:  40.11  . CATARACT EXTRACTION W/PHACO Left 08/11/2016    Procedure: CATARACT EXTRACTION PHACO AND INTRAOCULAR LENS PLACEMENT (IOC);  Surgeon: Estill Cotta, MD;  Location: [facility] ORS;  Service: Ophthalmology;  Laterality: Left;  [place] 1.24 AP% 26.1 CDE 37.79 FLUID PACK LOT # [id number]  . DILATION AND CURETTAGE OF UTERUS    . HEMORROIDECTOMY     x 2  . JOINT REPLACEMENT     left hip  . KNEE ARTHROPLASTY Right 01/05/2016   Procedure: COMPUTER ASSISTED TOTAL KNEE ARTHROPLASTY;  Surgeon: Dereck Leep, MD;  Location: [facility] ORS;  Service: Orthopedics;  Laterality: Right;  . KNEE ARTHROSCOPY Right 05/19/2015   Procedure: Right knee arthrosocpy medail and lateral menisectomy, chondroplasty ;  Surgeon: Dereck Leep, MD;  Location: [facility] ORS;  Service: Orthopedics;  Laterality: Right;  . Left total hip arthroplasty    . PACEMAKER INSERTION Left 12/01/2018   Procedure: INSERTION PACEMAKER Dual Chamber;  Surgeon: Isaias Cowman, MD;  Location: [facility] ORS;  Service: Cardiovascular;  Laterality: Left;  . PAROTIDECTOMY    . PITUITARY SURGERY    . THYROID SURGERY     bx  . THYROIDECTOMY      Prior to Admission medications   Medication Sig Start Date End Date Taking? Authorizing Provider  acetaminophen (TYLENOL) 325 MG tablet Take 650 mg by mouth daily.    [provider]  cephALEXin (KEFLEX) 500 MG capsule Take 1 capsule (500 mg total) by mouth 3 (three) times daily for 7 days. 02/17/19 02/24/19  Rudene Re, MD  docusate sodium (COLACE) 100 MG capsule Take 100-200 mg by mouth 2 (two) times daily.     [provider]  DULoxetine (CYMBALTA) 60 MG capsule Take 60 mg by mouth daily.  02/02/16   [provider]  ferrous sulfate 325 (65 FE) MG tablet Take 325 mg by mouth daily with breakfast.     [provider]  gabapentin (NEURONTIN) 400 MG capsule Take 800 mg by mouth 2 (two) times daily.  02/09/16   [provider]  levothyroxine (SYNTHROID, LEVOTHROID) 88 MCG tablet Take 88 mcg by mouth daily. 12/25/18    [provider]  MAGNESIUM PO Take 250 mg by mouth daily.     [provider]  Multiple Vitamins-Minerals (PRESERVISION AREDS 2) CAPS Take 1 capsule by mouth 2 (two) times daily.    [provider]  omeprazole (PRILOSEC) 40 MG capsule Take 40 mg by mouth 2 (two) times daily.  02/16/16   [provider]  potassium chloride (K-DUR,KLOR-CON) 10 MEQ tablet Take 10 mEq by mouth daily.     [provider]  traMADol (ULTRAM) 50 MG tablet Take 50 mg by mouth every 6 (six) hours as needed.    [provider]    Allergies Acetaminophen-codeine; Aleve [naproxen]; Penicillins; Sulfa antibiotics; and Codeine  Family History  Problem Relation Age of Onset  . Heart disease Father   . Heart attack Father   . Diabetes Paternal Uncle   . Cancer Neg Hx   . Breast cancer Neg Hx     Social History Social History  Tobacco Use  . Smoking status: Never Smoker  . Smokeless tobacco: Never Used  Substance Use Topics  . Alcohol use: No  . Drug use: No    Review of Systems  Constitutional: Negative for fever. + dizziness Eyes: Negative for visual changes. ENT: Negative for sore throat. Neck: No neck pain  Cardiovascular: Negative for chest pain. Respiratory: Negative for shortness of breath. Gastrointestinal: Negative for abdominal pain, vomiting or diarrhea. Genitourinary: Negative for dysuria. Musculoskeletal: Negative for back pain. Skin: Negative for rash. Neurological: Negative for headaches, weakness or numbness. Psych: No SI or HI  ____________________________________________   PHYSICAL EXAM:  VITAL SIGNS: ED Triage Vitals  Enc Vitals Group     BP 02/17/19 1043 (!) 152/73     Pulse Rate 02/17/19 1043 67     Resp 02/17/19 1043 16     Temp 02/17/19 1043 97.7 F (36.5 C)     Temp Source 02/17/19 1043 Oral     SpO2 02/17/19 1043 98 %     Weight 02/17/19 1046 116 lb (52.6 kg)     Height 02/17/19 1046 5' (1.524 m)     Head  Circumference --      Peak Flow --      Pain Score 02/17/19 1045 0     Pain Loc --      Pain Edu? --      Excl. in [place]? --     Constitutional: Alert and oriented. Well appearing and in no apparent distress. HEENT:      Head: Normocephalic and atraumatic.         Eyes: Conjunctivae are normal. Sclera is non-icteric.       Mouth/Throat: Mucous membranes are moist.       Neck: Supple with no signs of meningismus. Cardiovascular: Regular rate and rhythm. No murmurs, gallops, or rubs. 2+ symmetrical distal pulses are present in all extremities. No JVD. Respiratory: Normal respiratory effort. Lungs are clear to auscultation bilaterally. No wheezes, crackles, or rhonchi.  Gastrointestinal: Soft, non tender, and non distended with positive bowel sounds. No rebound or guarding. Musculoskeletal: Nontender with normal range of motion in all extremities. No edema, cyanosis, or erythema of extremities. Neurologic: Normal speech and language. Face is symmetric.  Intact strength and sensation x4, no pronator drift. Skin: Skin is warm, dry and intact. No rash noted. Psychiatric: Mood and affect are normal. Speech and behavior are normal.  ____________________________________________   LABS (all labs ordered are listed, but only abnormal results are displayed)  Labs Reviewed  CBC WITH DIFFERENTIAL/PLATELET - Abnormal; Notable for the following components:      Result Value   Hemoglobin 11.8 (*)    Eosinophils Absolute 0.9 (*)    All other components within normal limits  BASIC METABOLIC PANEL - Abnormal; Notable for the following components:   Calcium 8.0 (*)    All other components within normal limits  URINALYSIS, COMPLETE (UACMP) WITH MICROSCOPIC - Abnormal; Notable for the following components:   Color, Urine YELLOW (*)    APPearance HAZY (*)    Specific Gravity, Urine 1.004 (*)    Hgb urine dipstick SMALL (*)    Nitrite POSITIVE (*)    Leukocytes,Ua LARGE (*)    WBC, UA >50 (*)     Bacteria, UA RARE (*)    All other components within normal limits  URINE CULTURE  TROPONIN I  GLUCOSE, CAPILLARY   ____________________________________________  EKG  ED ECG REPORT I, Rudene Re, the attending physician, personally viewed and  interpreted this ECG.  Atrial paced rhythm, rate of 71, prolonged QTC, normal axis, no ST elevations or depressions.  Unchanged from prior. ____________________________________________  RADIOLOGY  none  ____________________________________________   PROCEDURES  Procedure(s) performed: None Procedures Critical Care performed:  None ____________________________________________   INITIAL IMPRESSION / ASSESSMENT AND PLAN / ED COURSE  85 y.o. female with a history of syncope, idiopathic cardiomyopathy, asthma who presents for evaluation of near syncopal episode.  Patient has been extensively evaluated for these episodes which are thought to be a combination of symptomatic bradycardia, orthostatic hypotension, and possible partial seizures.  She is followed with cardiology and neurology.  She is scheduled to undergo a prolonged outpatient EEG.  She is currently on Keppra.  She has received a pacemaker.  She reports that she feels foggy during these episodes.  She was orthostatic on arrival to the emergency room for which she was given fluids.  Her pacemaker was interrogated with no dysrhythmias seen.  Her EKG shows a paced rhythm with no ischemia.  Her labs showed stable mild anemia, no leukocytosis, normal electrolytes, normal kidney function.  UA is positive for urinary tract infection.  No signs of sepsis.  Patient was started on Keflex.  At this time she stable for discharge home with close follow-up with PCP.  I discussed my standard return precautions.      As part of my medical decision making, I reviewed the following data within the [facility] notes reviewed and incorporated, Labs reviewed , EKG interpreted  , Old EKG reviewed, Old chart reviewed, Notes from prior ED visits and [place] Controlled Substance Database    Pertinent labs & imaging results that were available during my care of the patient were reviewed by me and considered in my medical decision making (see chart for details).    ____________________________________________   FINAL CLINICAL IMPRESSION(S) / ED DIAGNOSES  Final diagnoses:  Orthostatic dizziness  Acute cystitis with hematuria      NEW MEDICATIONS STARTED DURING THIS VISIT:  ED Discharge Orders         Ordered    cephALEXin (KEFLEX) 500 MG capsule  3 times daily     02/17/19 1301           Note:  This document was prepared using Dragon voice recognition software and may include unintentional dictation errors.    Alfred Levins, [place], MD 02/17/19 [phone]

## 2019-02-17 NOTE — ED Triage Notes (Signed)
Pt reports that she was seated at the breakfast table and could feel that something wasn't right like she was going to pass out, her caregiver assisted her to the floor, ems reports that the pt's systolic pressure was 507 with lying and dropped to 59 systolic with sitting, pt denies pain at this time, states that she felt fine yesterday, ems reports that she has a hx of this

## 2019-02-17 NOTE — ED Notes (Signed)
Pt ambulated well to restroom with minimal assistance.

## 2019-02-19 LAB — URINE CULTURE: Culture: >=100000 — AB

## 2019-02-20 NOTE — Progress Notes (Signed)
Pharmacist Physician Communication  Pharmacist reviewed ED culture report. Urine culture with E. coli resistant to cefazolin. Patient was sent home with Keflex. Patient presented with near syncopal episode. UA with > 50 WBC.  Spoke with EDP Joni Fears. Will change Keflex to Cipro 250 mg BID x 3 days. I called the patient and discussed with her. She will stop the Keflex and start Cipro. Prescription has been called into her pharmacy.  Tawnya Crook, PharmD Pharmacy Resident  02/20/2019 1:59 PM

## 2019-02-21 ENCOUNTER — Ambulatory Visit: Payer: Medicare Other

## 2019-03-06 DIAGNOSIS — R569 Unspecified convulsions: Secondary | ICD-10-CM | POA: Insufficient documentation

## 2019-03-12 ENCOUNTER — Ambulatory Visit (HOSPITAL_COMMUNITY)
Admission: RE | Admit: 2019-03-12 | Discharge: 2019-03-12 | Disposition: A | Discharge status: Home / Self Care | Payer: Medicare Other | Source: Ambulatory Visit | Attending: Radiology | Admitting: Radiology

## 2019-03-12 ENCOUNTER — Other Ambulatory Visit: Payer: Self-pay

## 2019-03-12 DIAGNOSIS — D352 Benign neoplasm of pituitary gland: Secondary | ICD-10-CM | POA: Diagnosis not present

## 2019-03-12 MED ORDER — GADOBUTROL 1 MMOL/ML IV SOLN
5.0000 mL | Freq: Once | INTRAVENOUS | Status: AC | PRN
  Administered 2019-03-12: 15:00:00 5 mL via INTRAVENOUS

## 2019-03-31 ENCOUNTER — Emergency Department
Admission: EM | Admit: 2019-03-31 | Discharge: 2019-04-01 | Disposition: A | Discharge status: Other Acute Inpatient Hospital | Payer: Medicare Other | Attending: Emergency Medicine | Admitting: Emergency Medicine

## 2019-03-31 ENCOUNTER — Other Ambulatory Visit: Payer: Self-pay

## 2019-03-31 DIAGNOSIS — R569 Unspecified convulsions: Secondary | ICD-10-CM

## 2019-03-31 DIAGNOSIS — R55 Syncope and collapse: Secondary | ICD-10-CM | POA: Diagnosis present

## 2019-03-31 DIAGNOSIS — I252 Old myocardial infarction: Secondary | ICD-10-CM | POA: Diagnosis not present

## 2019-03-31 DIAGNOSIS — E039 Hypothyroidism, unspecified: Secondary | ICD-10-CM | POA: Insufficient documentation

## 2019-03-31 DIAGNOSIS — Z03818 Encounter for observation for suspected exposure to other biological agents ruled out: Secondary | ICD-10-CM | POA: Diagnosis not present

## 2019-03-31 DIAGNOSIS — I10 Essential (primary) hypertension: Secondary | ICD-10-CM | POA: Insufficient documentation

## 2019-03-31 DIAGNOSIS — Z79899 Other long term (current) drug therapy: Secondary | ICD-10-CM | POA: Insufficient documentation

## 2019-03-31 DIAGNOSIS — J45909 Unspecified asthma, uncomplicated: Secondary | ICD-10-CM | POA: Diagnosis not present

## 2019-03-31 DIAGNOSIS — Z950 Presence of cardiac pacemaker: Secondary | ICD-10-CM | POA: Insufficient documentation

## 2019-03-31 DIAGNOSIS — I428 Other cardiomyopathies: Secondary | ICD-10-CM | POA: Insufficient documentation

## 2019-03-31 LAB — GLUCOSE, CAPILLARY: Glucose-Capillary: 107 mg/dL — ABNORMAL HIGH (ref 70–99)

## 2019-03-31 LAB — BASIC METABOLIC PANEL
Anion gap: 10 (ref 5–15)
BUN: 18 mg/dL (ref 8–23)
CO2: 22 mmol/L (ref 22–32)
Calcium: 7.9 mg/dL — ABNORMAL LOW (ref 8.9–10.3)
Chloride: 107 mmol/L (ref 98–111)
Creatinine, Ser: 1.01 mg/dL — ABNORMAL HIGH (ref 0.44–1.00)
GFR calc Af Amer: 60 mL/min — ABNORMAL LOW (ref >60)
GFR calc non Af Amer: 51 mL/min — ABNORMAL LOW (ref >60)
Glucose, Bld: 126 mg/dL — ABNORMAL HIGH (ref 70–99)
Potassium: 3.4 mmol/L — ABNORMAL LOW (ref 3.5–5.1)
Sodium: 139 mmol/L (ref 135–145)

## 2019-03-31 LAB — CBC WITH DIFFERENTIAL/PLATELET
Abs Immature Granulocytes: 0.02 10*3/uL (ref 0.00–0.07)
Basophils Absolute: 0 10*3/uL (ref 0.0–0.1)
Basophils Relative: 1 %
Eosinophils Absolute: 0.7 10*3/uL — ABNORMAL HIGH (ref 0.0–0.5)
Eosinophils Relative: 10 %
HCT: 33.3 % — ABNORMAL LOW (ref 36.0–46.0)
Hemoglobin: 10.8 g/dL — ABNORMAL LOW (ref 12.0–15.0)
Immature Granulocytes: 0 %
Lymphocytes Relative: 11 %
Lymphs Abs: 0.7 10*3/uL (ref 0.7–4.0)
MCH: 29.8 pg (ref 26.0–34.0)
MCHC: 32.4 g/dL (ref 30.0–36.0)
MCV: 91.7 fL (ref 80.0–100.0)
Monocytes Absolute: 0.8 10*3/uL (ref 0.1–1.0)
Monocytes Relative: 11 %
Neutro Abs: 4.6 10*3/uL (ref 1.7–7.7)
Neutrophils Relative %: 67 %
Platelets: 217 10*3/uL (ref 150–400)
RBC: 3.63 MIL/uL — ABNORMAL LOW (ref 3.87–5.11)
RDW: 13.4 % (ref 11.5–15.5)
WBC: 6.8 10*3/uL (ref 4.0–10.5)
nRBC: 0 % (ref 0.0–0.2)

## 2019-03-31 LAB — TROPONIN I (HIGH SENSITIVITY): Troponin I (High Sensitivity): 6 ng/L (ref <18)

## 2019-03-31 NOTE — ED Triage Notes (Signed)
Patient lost consciousness and was lowered to the ground. Patient has a history of seizures for which she takes keppra.

## 2019-03-31 NOTE — ED Provider Notes (Signed)
[facility] Emergency Department Provider Note  ____________________________________________   I have reviewed the triage vital signs and the nursing notes.   HISTORY  Chief Complaint Loss of Consciousness   History limited by: Not Limited some history obtained from son   HPI Laura Walters is a 85 y.o. female who presents to the emergency department today after a syncopal episode.  Son states that the patient has been having issues with syncope since January.  Initially they thought it was cardiology related and patient had pacemaker placed.  However continued have issues also had possibly some seizure-like episodes.  She is now being followed by neurology.  She has been started on Keppra but continues to have these episodes.  She had multiple episodes yesterday and then another episode today.  Records reviewed. Per medical record review patient has a history of syncope/seizure episodes, pacemaker.   Past Medical History:  Diagnosis Date  . Arthritis   . Asthma   . Back pain   . Cancer of thyroid ([place]) 03/30/2015  . Cardiomyopathy ([place])    idiopathic  . Chest pain    non cardiac  . Chokes    easily   swallow test negative  . Constipation   . DDD (degenerative disc disease), lumbar   . Disc disorder   . GERD (gastroesophageal reflux disease)   . H/O wheezing    advair as needed  . Hypertension   . Hypothyroidism   . IBS (irritable bowel syndrome)   . Neuropathy   . Nocturia   . RAD (reactive airway disease)   . Rectocele   . Seasonal allergies   . Spinal stenosis   . Urge incontinence   . Vaginal atrophy   . Vaginal polyp     Patient Active Problem List   Diagnosis Date Noted  . Syncope and collapse 01/09/2019  . Bradycardia, unspecified 11/30/2018  . Bradycardia 11/21/2018  . NSTEMI (non-ST elevated myocardial infarction) ([place]) 11/18/2018  . Syncope 11/03/2018  . Vaginal bleeding 08/02/2018  . Abrasion of vagina 08/02/2018  . Goals of  care, counseling/discussion 01/05/2018  . Uterine prolapse 02/03/2017  . Pulmonary metastases ([place]) 04/09/2016  . RAD (reactive airway disease) 02/27/2016  . Recurrent sinus infections 01/31/2016  . H/O total knee replacement 01/20/2016  . S/P total knee arthroplasty 01/05/2016  . 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 Arthropathy, traumatic, knee 12/16/2015  . Cystocele, midline 12/02/2015  . Rectocele 12/02/2015  . Frequent UTI 11/26/2015  . Acquired hypothyroidism 10/13/2015  . Pituitary tumor 10/13/2015  . Degeneration of intervertebral disc of lumbar region 08/01/2015  . Lumbar canal stenosis 08/01/2015  . Neuritis or radiculitis due to rupture of lumbar intervertebral disc 08/01/2015  . LBP (low back pain) 03/30/2015  . Neuropathy ([place]) 03/30/2015  . Chest pain, non-cardiac 03/30/2015  . Neoplasm of pituitary gland 03/30/2015  . Cancer of thyroid ([place]) 03/30/2015  . Combined fat and carbohydrate induced hyperlipemia 10/17/2014  . Primary cardiomyopathy ([place]) 10/14/2014    Past Surgical History:  Procedure Laterality Date  . BREAST BIOPSY Right 86 and 92   2 bx-neg  . CATARACT EXTRACTION W/PHACO Right 05/19/2016   Procedure: CATARACT EXTRACTION PHACO AND INTRAOCULAR LENS PLACEMENT (IOC);  Surgeon: Estill Cotta, MD;  Location: [facility] ORS;  Service: Ophthalmology;  Laterality: Right;  Lot# [id number] H [place]:   1:25.3 AP%:  24.9% CDE:  40.11  . CATARACT EXTRACTION W/PHACO Left 08/11/2016   Procedure: CATARACT EXTRACTION PHACO AND INTRAOCULAR LENS PLACEMENT (IOC);  Surgeon: Estill Cotta, MD;  Location: [facility] ORS;  Service: Ophthalmology;  Laterality: Left;  [place] 1.24 AP% 26.1 CDE 37.79 FLUID PACK LOT # [id number]  . DILATION AND CURETTAGE OF UTERUS    . HEMORROIDECTOMY     x 2  . JOINT REPLACEMENT     left hip  . KNEE ARTHROPLASTY Right 01/05/2016   Procedure: COMPUTER ASSISTED TOTAL KNEE ARTHROPLASTY;  Surgeon: Dereck Leep, MD;  Location: [facility] ORS;   Service: Orthopedics;  Laterality: Right;  . KNEE ARTHROSCOPY Right 05/19/2015   Procedure: Right knee arthrosocpy medail and lateral menisectomy, chondroplasty ;  Surgeon: Dereck Leep, MD;  Location: [facility] ORS;  Service: Orthopedics;  Laterality: Right;  . Left total hip arthroplasty    . PACEMAKER INSERTION Left 12/01/2018   Procedure: INSERTION PACEMAKER Dual Chamber;  Surgeon: Isaias Cowman, MD;  Location: [facility] ORS;  Service: Cardiovascular;  Laterality: Left;  . PAROTIDECTOMY    . PITUITARY SURGERY    . THYROID SURGERY     bx  . THYROIDECTOMY      Prior to Admission medications   Medication Sig Start Date End Date Taking? Authorizing Provider  acetaminophen (TYLENOL) 325 MG tablet Take 650 mg by mouth daily.    [provider]  docusate sodium (COLACE) 100 MG capsule Take 100-200 mg by mouth 2 (two) times daily.     [provider]  DULoxetine (CYMBALTA) 60 MG capsule Take 60 mg by mouth daily.  02/02/16   [provider]  ferrous sulfate 325 (65 FE) MG tablet Take 325 mg by mouth daily with breakfast.     [provider]  gabapentin (NEURONTIN) 400 MG capsule Take 800 mg by mouth 2 (two) times daily.  02/09/16   [provider]  levothyroxine (SYNTHROID, LEVOTHROID) 88 MCG tablet Take 88 mcg by mouth daily. 12/25/18   [provider]  MAGNESIUM PO Take 250 mg by mouth daily.     [provider]  Multiple Vitamins-Minerals (PRESERVISION AREDS 2) CAPS Take 1 capsule by mouth 2 (two) times daily.    [provider]  omeprazole (PRILOSEC) 40 MG capsule Take 40 mg by mouth 2 (two) times daily.  02/16/16   [provider]  potassium chloride (K-DUR,KLOR-CON) 10 MEQ tablet Take 10 mEq by mouth daily.     [provider]  traMADol (ULTRAM) 50 MG tablet Take 50 mg by mouth every 6 (six) hours as needed.    [provider]    Allergies Acetaminophen-codeine, Aleve [naproxen], Penicillins,  Sulfa antibiotics, and Codeine  Family History  Problem Relation Age of Onset  . Heart disease Father   . Heart attack Father   . Diabetes Paternal Uncle   . Cancer Neg Hx   . Breast cancer Neg Hx     Social History Social History   Tobacco Use  . Smoking status: Never Smoker  . Smokeless tobacco: Never Used  Substance Use Topics  . Alcohol use: No  . Drug use: No    Review of Systems Constitutional: No fever/chills Eyes: No visual changes. ENT: No sore throat. Cardiovascular: Denies chest pain. Respiratory: Denies shortness of breath. Gastrointestinal: No abdominal pain.  No nausea, no vomiting.  No diarrhea.   Genitourinary: Negative for dysuria. Musculoskeletal: Negative for back pain. Skin: Negative for rash. Neurological: Positive for seizure like episode ____________________________________________   PHYSICAL EXAM:  VITAL SIGNS: ED Triage Vitals  Enc Vitals Group     BP 03/31/19 1642 113/64  Pulse Rate 03/31/19 1642 70     Resp 03/31/19 1642 20     Temp 03/31/19 1642 98.5 F (36.9 C)     Temp Source 03/31/19 1642 Oral     SpO2 03/31/19 1642 98 %     Weight 03/31/19 1644 120 lb (54.4 kg)     Height 03/31/19 1644 5' (1.524 m)     Head Circumference --      Peak Flow --      Pain Score 03/31/19 1643 0   Constitutional: Awake and alert. Not completely oriented.  Eyes: Conjunctivae are normal.  ENT      Head: Normocephalic and atraumatic.      Nose: No congestion/rhinnorhea.      Mouth/Throat: Mucous membranes are moist.      Neck: No stridor. Hematological/Lymphatic/Immunilogical: No cervical lymphadenopathy. Cardiovascular: Normal rate, regular rhythm.  No murmurs, rubs, or gallops.  Respiratory: Normal respiratory effort without tachypnea nor retractions. Breath sounds are clear and equal bilaterally. No wheezes/rales/rhonchi. Gastrointestinal: Soft and non tender. No rebound. No guarding.  Genitourinary: Deferred Musculoskeletal: Normal range  of motion in all extremities. No lower extremity edema. Neurologic:  Awake and alert. Not completely oriented. Moving all extremities. Sensation intact.  Skin:  Skin is warm, dry and intact. No rash noted. Psychiatric: Mood and affect are normal. Speech and behavior are normal. Patient exhibits appropriate insight and judgment.  ____________________________________________    LABS (pertinent positives/negatives)  BMP na 139, k 3.4, cr 1.01 Trop hs 6 CBC wbc 6.8, hgb 10.8, plt 217  ____________________________________________   EKG  I, Nance Pear, attending physician, personally viewed and interpreted this EKG  EKG Time: 1651 Rate: 64 Rhythm: atrial paced rhythm Axis: normal Intervals: qtc 463 QRS: LBBB ST changes: no st elevation Impression: abnormal ekg  ____________________________________________    RADIOLOGY  None  ____________________________________________   PROCEDURES  Procedures  ____________________________________________   INITIAL IMPRESSION / ASSESSMENT AND PLAN / ED COURSE  Pertinent labs & imaging results that were available during my care of the patient were reviewed by me and considered in my medical decision making (see chart for details).   Patient presented to the emergency department today with concerns for another seizure like episode/syncopal episode.  Patient has been having these episodes for a number of months.  She is now working with Dr. Manuella Ghazi with neurology.  Has started keppra. I spoke directly to Dr. Brigitte Pulse who does think patient would benefit from prolonged EEG monitoring.  We do not have that availability here at [facility].  Discussed with [facility] who accepted the patient in transfer. Discussed plan for transfer with patient and son.   ____________________________________________   FINAL CLINICAL IMPRESSION(S) / ED DIAGNOSES  Final diagnoses:  Seizure-like activity ([place])  Syncope, unspecified syncope type      Note: This dictation was prepared with Dragon dictation. Any transcriptional errors that result from this process are unintentional     Nance Pear, MD 03/31/19 2246

## 2019-04-01 DIAGNOSIS — I951 Orthostatic hypotension: Secondary | ICD-10-CM | POA: Insufficient documentation

## 2019-04-01 LAB — URINALYSIS, COMPLETE (UACMP) WITH MICROSCOPIC
Bilirubin Urine: NEGATIVE
Glucose, UA: NEGATIVE mg/dL
Hgb urine dipstick: NEGATIVE
Ketones, ur: NEGATIVE mg/dL
Nitrite: POSITIVE — AB
Protein, ur: 30 mg/dL — AB
Specific Gravity, Urine: 1.016 (ref 1.005–1.030)
WBC, UA: >50 WBC/hpf — ABNORMAL HIGH (ref 0–5)
pH: 5 (ref 5.0–8.0)

## 2019-04-01 MED ORDER — TRIAMCINOLONE ACETONIDE 0.1 % EX CREA
TOPICAL_CREAM | CUTANEOUS | Status: DC
Start: ? — End: 2019-04-01

## 2019-04-01 MED ORDER — ASPIRIN 81 MG PO CHEW
81.00 | CHEWABLE_TABLET | ORAL | Status: DC
Start: 2019-04-02 — End: 2019-04-01

## 2019-04-01 MED ORDER — ENOXAPARIN SODIUM 30 MG/0.3ML ~~LOC~~ SOLN
30.00 | SUBCUTANEOUS | Status: DC
Start: 2019-04-02 — End: 2019-04-01

## 2019-04-01 MED ORDER — LIDOCAINE HCL 1 % IJ SOLN
0.50 | INTRAMUSCULAR | Status: DC
Start: ? — End: 2019-04-01

## 2019-04-01 MED ORDER — VITRUM SENIOR PO TABS
1.00 | ORAL_TABLET | ORAL | Status: DC
Start: 2019-04-02 — End: 2019-04-01

## 2019-04-01 MED ORDER — GABAPENTIN 400 MG PO CAPS
800.00 | ORAL_CAPSULE | ORAL | Status: DC
Start: 2019-04-02 — End: 2019-04-01

## 2019-04-01 MED ORDER — DOCUSATE SODIUM 100 MG PO CAPS
100.00 | ORAL_CAPSULE | ORAL | Status: DC
Start: 2019-04-02 — End: 2019-04-01

## 2019-04-01 MED ORDER — SENNOSIDES-DOCUSATE SODIUM 8.6-50 MG PO TABS
1.00 | ORAL_TABLET | ORAL | Status: DC
Start: ? — End: 2019-04-01

## 2019-04-01 MED ORDER — DULOXETINE HCL 20 MG PO CPEP
20.00 | ORAL_CAPSULE | ORAL | Status: DC
Start: 2019-04-02 — End: 2019-04-01

## 2019-04-01 MED ORDER — LEVOTHYROXINE SODIUM 88 MCG PO TABS
88.00 | ORAL_TABLET | ORAL | Status: DC
Start: 2019-04-03 — End: 2019-04-01

## 2019-04-01 MED ORDER — MIDODRINE HCL 5 MG PO TABS
5.00 | ORAL_TABLET | ORAL | Status: DC
Start: 2019-04-02 — End: 2019-04-01

## 2019-04-02 MED ORDER — FEXOFENADINE HCL 60 MG PO TABS
60.00 | ORAL_TABLET | ORAL | Status: DC
Start: 2019-04-02 — End: 2019-04-02

## 2019-04-03 ENCOUNTER — Ambulatory Visit: Payer: Medicare Other | Admitting: Hematology and Oncology

## 2019-04-03 ENCOUNTER — Other Ambulatory Visit: Payer: Medicare Other

## 2019-04-03 LAB — NOVEL CORONAVIRUS, NAA (HOSP ORDER, SEND-OUT TO REF LAB; TAT 18-24 HRS): SARS-CoV-2, NAA: NOT DETECTED

## 2019-04-20 ENCOUNTER — Other Ambulatory Visit: Payer: Self-pay

## 2019-04-23 ENCOUNTER — Other Ambulatory Visit: Payer: Medicare Other

## 2019-04-23 ENCOUNTER — Ambulatory Visit: Payer: Medicare Other | Admitting: Hematology and Oncology

## 2019-05-21 ENCOUNTER — Encounter: Payer: Medicare Other | Admitting: Hematology and Oncology

## 2019-05-21 ENCOUNTER — Other Ambulatory Visit: Payer: Medicare Other

## 2019-05-24 ENCOUNTER — Encounter: Payer: Medicare Other | Admitting: Obstetrics and Gynecology

## 2019-05-29 ENCOUNTER — Encounter: Payer: Medicare Other | Admitting: Obstetrics and Gynecology

## 2019-06-06 ENCOUNTER — Inpatient Hospital Stay: Payer: Medicare Other

## 2019-06-06 ENCOUNTER — Inpatient Hospital Stay: Payer: Medicare Other | Admitting: Hematology and Oncology

## 2019-06-20 ENCOUNTER — Ambulatory Visit: Payer: Medicare Other | Admitting: Hematology and Oncology

## 2019-06-20 ENCOUNTER — Other Ambulatory Visit: Payer: Medicare Other

## 2019-06-26 ENCOUNTER — Other Ambulatory Visit: Payer: Self-pay

## 2019-06-26 ENCOUNTER — Encounter: Payer: Self-pay | Admitting: Hematology and Oncology

## 2019-06-26 DIAGNOSIS — D352 Benign neoplasm of pituitary gland: Secondary | ICD-10-CM | POA: Insufficient documentation

## 2019-06-26 NOTE — Progress Notes (Signed)
Patient stated that she had been doing well with no complaints. Patient would like an Influenza injection.

## 2019-06-26 NOTE — Progress Notes (Signed)
[facility]  [address], [address] [place], [place] [address] Phone: [phone]  Fax: [phone]   Clinic Day:  06/27/2019  Referring physician: Idelle Crouch, MD  Chief Complaint: Laura Walters is a 85 y.o. female with thyroid carcinoma and pituitary tumor who is seen for 10 month reassessment.   HPI: The patient was last seen in the medical oncology clinic on 08/09/2018.  At that time, she was doing well.  She denied any acute physical concerns today.  Patient had not experienced any B symptoms or recent infections.  Exam was grossly unremarkable.    She was admitted to [facility] from 11/03/2018 - 11/04/2018 for acute syncope loss off consciousness w/ complete recovery. Denied any pre or post weakness/numbness/headache/vertigo/abnormal activities. EF was 50-55%  She was admitted to [facility] from 11/18/2018 - 11/19/2018 for unspecified syncope episode, thought to be secondary to prolonged QT as she was on ciprofloxacin.  She was noted to have an elevated troponin of 3.3  She was admitted to [facility] from 11/21/2018 - 11/22/2018 with symptoms of drowsiness and bradycardia. She denied chest pain and focal weakness, but did complain of a headache. She was worked up with CTA head and neck (normal study).  Head MRI did not reveal any acute abnormality except pituitary tumor which was chronic.  Cardiology evaluated the patient during hospitalization and did not recommend any acute intervention. She was encouraged to follow up with her PCP in one week at discharge.   She was admitted to [facility] from 11/30/2018 - 12/02/2018 for recurrent episodes of syncope with symptoms of bradycardia.  Dr.Paraschos placed apacemaker on03/02/2019.    She was admitted to [facility] from 01/09/2019 - 01/10/2019 for loss of consciousness with generalized weakness.   She was seen in the [facility] ER on 02/17/2019 and 03/31/2019 with loss of consciousness.  During the interim, she notes multiple episodes of  syncope. She states that the pacemaker, did not help. She had an EEG with no seizures documented. She was diagnosed with a "low blood pressure".  She is on Florinef.  She states that she feels horrible and weak.  She ttries to move around the house. She has been able to use cane which makes her feel better.   She has help from caretaker to do day to day activities such as chores, bathing, getting dressed.   She notes that her voice has been hoarse.  She denies any contact with any person with COVID.  She would like refill on inhaler medication and flu-shot today.   Past Medical History:  Diagnosis Date  . Arthritis   . Asthma   . Back pain   . Cancer of thyroid ([place]) 03/30/2015  . Cardiomyopathy ([place])    idiopathic  . Chest pain    non cardiac  . Chokes    easily   swallow test negative  . Constipation   . DDD (degenerative disc disease), lumbar   . Disc disorder   . GERD (gastroesophageal reflux disease)   . H/O wheezing    advair as needed  . Hypertension   . Hypothyroidism   . IBS (irritable bowel syndrome)   . Neuropathy   . Nocturia   . RAD (reactive airway disease)   . Rectocele   . Seasonal allergies   . Spinal stenosis   . Urge incontinence   . Vaginal atrophy   . Vaginal polyp     Past Surgical History:  Procedure Laterality Date  . BREAST BIOPSY Right 86  and 92   2 bx-neg  . CATARACT EXTRACTION W/PHACO Right 05/19/2016   Procedure: CATARACT EXTRACTION PHACO AND INTRAOCULAR LENS PLACEMENT (IOC);  Surgeon: Estill Cotta, MD;  Location: [facility] ORS;  Service: Ophthalmology;  Laterality: Right;  Lot# [id number] [place]:   1:25.3 AP%:  24.9% CDE:  40.11  . CATARACT EXTRACTION W/PHACO Left 08/11/2016   Procedure: CATARACT EXTRACTION PHACO AND INTRAOCULAR LENS PLACEMENT (IOC);  Surgeon: Estill Cotta, MD;  Location: [facility] ORS;  Service: Ophthalmology;  Laterality: Left;  [place] 1.24 AP% 26.1 CDE 37.79 FLUID PACK LOT # [id number]  . DILATION AND CURETTAGE OF UTERUS    .  HEMORROIDECTOMY     x 2  . JOINT REPLACEMENT     left hip  . KNEE ARTHROPLASTY Right 01/05/2016   Procedure: COMPUTER ASSISTED TOTAL KNEE ARTHROPLASTY;  Surgeon: Dereck Leep, MD;  Location: [facility] ORS;  Service: Orthopedics;  Laterality: Right;  . KNEE ARTHROSCOPY Right 05/19/2015   Procedure: Right knee arthrosocpy medail and lateral menisectomy, chondroplasty ;  Surgeon: Dereck Leep, MD;  Location: [facility] ORS;  Service: Orthopedics;  Laterality: Right;  . Left total hip arthroplasty    . PACEMAKER INSERTION Left 12/01/2018   Procedure: INSERTION PACEMAKER Dual Chamber;  Surgeon: Isaias Cowman, MD;  Location: [facility] ORS;  Service: Cardiovascular;  Laterality: Left;  . PAROTIDECTOMY    . PITUITARY SURGERY    . THYROID SURGERY     bx  . THYROIDECTOMY      Family History  Problem Relation Age of Onset  . Heart disease Father   . Heart attack Father   . Diabetes Paternal Uncle   . Cancer Neg Hx   . Breast cancer Neg Hx     Social History:  reports that she has never smoked. She has never used smokeless tobacco. She reports that she does not drink alcohol or use drugs. The patient is accompanied by her son Laura Walters today via video chat.  He can be reached at [phone]. Live at home in [place] with 24 hour care taker.  Allergies:  Allergies  Allergen Reactions  . Acetaminophen-Codeine Other (See Comments)  . Aleve [Naproxen] Hives    Causes dizziness  . Penicillins Hives    Has patient had a PCN reaction causing immediate rash, facial/tongue/throat swelling, SOB or lightheadedness with hypotension: hives Has patient had a PCN reaction causing severe rash involving mucus membranes or skin necrosis: no Has patient had a PCN reaction that required hospitalization no Has patient had a PCN reaction occurring within the last 10 years: no If all of the above answers are "NO", then may proceed with Cephalosporin use.  . Sulfa Antibiotics Hives  . Codeine Hives, Itching and Other (See  Comments)    dizziness    Current Medications: Current Outpatient Medications  Medication Sig Dispense Refill  . acetaminophen (TYLENOL) 325 MG tablet Take 650 mg by mouth daily.    Marland Kitchen docusate sodium (COLACE) 100 MG capsule Take 100-200 mg by mouth 2 (two) times daily.     . DULoxetine HCl 40 MG CPEP Take 40 mg by mouth daily.     . fludrocortisone (FLORINEF) 0.1 MG tablet Take 0.5 mg by mouth 1 day or 1 dose.    . gabapentin (NEURONTIN) 400 MG capsule Take 1 capsule by mouth 1 day or 1 dose.    . levETIRAcetam (KEPPRA) 500 MG tablet Take 750-1,000 mg by mouth 2 (two) times daily. Take 750 mg in the morning and 1,000 mg at night    .  levothyroxine (SYNTHROID, LEVOTHROID) 88 MCG tablet Take 88 mcg by mouth daily.    . midodrine (PROAMATINE) 10 MG tablet Take 1 tablet by mouth 3 (three) times daily.    Marland Kitchen omeprazole (PRILOSEC) 40 MG capsule Take 40 mg by mouth 2 (two) times daily.     Marland Kitchen ALPRAZolam (XANAX) 0.25 MG tablet Take 1 tablet by mouth 1 day or 1 dose.    . ferrous sulfate 325 (65 FE) MG tablet Take 325 mg by mouth daily with breakfast.     . MAGNESIUM PO Take 250 mg by mouth daily.     . Multiple Vitamins-Minerals (PRESERVISION AREDS 2) CAPS Take 1 capsule by mouth 2 (two) times daily.    . potassium chloride (K-DUR,KLOR-CON) 10 MEQ tablet Take 10 mEq by mouth daily.     . traMADol (ULTRAM) 50 MG tablet Take 50 mg by mouth every 6 (six) hours as needed.     No current facility-administered medications for this visit.     Review of Systems  Constitutional: Positive for weight loss (4 pounds). Negative for chills, diaphoresis, fever and malaise/fatigue.       Feels "horrible and weak".  HENT: Negative.  Negative for ear discharge, ear pain, nosebleeds, sinus pain, sore throat and tinnitus.   Eyes: Negative.  Negative for blurred vision, double vision, photophobia, pain, discharge and redness.  Respiratory: Negative.  Negative for cough, hemoptysis, sputum production and shortness of  breath.   Cardiovascular: Negative.  Negative for chest pain, palpitations, orthopnea, leg swelling and PND.       S/p pacemaker placement.  Gastrointestinal: Negative.  Negative for abdominal pain, blood in stool, constipation, diarrhea, melena, nausea and vomiting.  Genitourinary: Negative.  Negative for dysuria, frequency, hematuria and urgency.  Musculoskeletal: Positive for back pain (chronic), falls (h/o recurrent syncope) and joint pain. Negative for myalgias.  Skin: Negative.  Negative for itching and rash.  Neurological: Positive for weakness (generalized). Negative for dizziness, tremors, sensory change, speech change, focal weakness, seizures and headaches.  Endo/Heme/Allergies: Does not bruise/bleed easily.       S/p thyroidectomy on levothyroxine.   Psychiatric/Behavioral: Negative.  Negative for depression and memory loss. The patient is not nervous/anxious and does not have insomnia.   All other systems reviewed and are negative.  Performance status (ECOG):  2-3  Vitals Blood pressure (!) 151/78, pulse 65, temperature (!) 97.1 F (36.2 C), temperature source Tympanic, resp. rate 18, height 5' (1.524 m), weight 114 lb 11.2 oz (52 kg), SpO2 100 %.   Physical Exam  Constitutional: She is oriented to person, place, and time. She appears well-developed and well-nourished.  Fatigued appearing elderly woman sitting comfortably in the exam room in no acute distress.  HENT:  Head: Normocephalic and atraumatic.  Mouth/Throat: No oropharyngeal exudate.  Short gray hair.  Mask.  Eyes: Pupils are equal, round, and reactive to light. Conjunctivae and EOM are normal. No scleral icterus.  Blue eyes.  Neck: Normal range of motion. Neck supple. No JVD present.  Cardiovascular: Normal rate, regular rhythm and normal heart sounds.  Pulmonary/Chest: Effort normal and breath sounds normal. No respiratory distress. She has no wheezes. She has no rales.  Abdominal: Soft. Bowel sounds are  normal. She exhibits no distension and no mass. There is no abdominal tenderness. There is no rebound and no guarding.  Musculoskeletal: Normal range of motion.        General: No edema.  Lymphadenopathy:    She has no cervical adenopathy.  Neurological: She  is alert and oriented to person, place, and time.  Skin: Skin is warm and dry. No rash noted. No erythema. No pallor.  Psychiatric: She has a normal mood and affect. Her behavior is normal. Judgment and thought content normal.  Nursing note and vitals reviewed.   Appointment on 06/27/2019  Component Date Value Ref Range Status  . Free T4 06/27/2019 1.08  0.61 - 1.12 ng/dL Final   Comment: (NOTE) Biotin ingestion may interfere with free T4 tests. If the results are inconsistent with the TSH level, previous test results, or the clinical presentation, then consider biotin interference. If needed, order repeat testing after stopping biotin. Performed at [facility], [address], [place], [place] [address]   . TSH 06/27/2019 0.216* 0.350 - 4.500 uIU/mL Final   Comment: Performed by a 3rd Generation assay with a functional sensitivity of <=0.01 uIU/mL. Performed at [facility], [address], [place], [place] [address]   . Sodium 06/27/2019 140  135 - 145 mmol/L Final  . Potassium 06/27/2019 3.3* 3.5 - 5.1 mmol/L Final  . Chloride 06/27/2019 106  98 - 111 mmol/L Final  . CO2 06/27/2019 23  22 - 32 mmol/L Final  . Glucose, Bld 06/27/2019 112* 70 - 99 mg/dL Final  . BUN 06/27/2019 15  8 - 23 mg/dL Final  . Creatinine, Ser 06/27/2019 0.83  0.44 - 1.00 mg/dL Final  . Calcium 06/27/2019 8.7* 8.9 - 10.3 mg/dL Final  . Total Protein 06/27/2019 7.1  6.5 - 8.1 g/dL Final  . Albumin 06/27/2019 3.5  3.5 - 5.0 g/dL Final  . AST 06/27/2019 21  15 - 41 U/L Final  . ALT 06/27/2019 14  0 - 44 U/L Final  . Alkaline Phosphatase 06/27/2019 79  38 - 126 U/L Final  . Total Bilirubin 06/27/2019 0.5  0.3 - 1.2 mg/dL Final  .  GFR calc non Af Amer 06/27/2019 >60  >60 mL/min Final  . GFR calc Af Amer 06/27/2019 >60  >60 mL/min Final  . Anion gap 06/27/2019 11  5 - 15 Final   Performed at [facility], [address], [place], [place] [address]  . WBC 06/27/2019 8.0  4.0 - 10.5 K/uL Corrected   Comment: NOTIFIED COURTNEY GRISSETT 1600 06/27/2019 CVP CORRECTED ON 09/30 AT 1600: PREVIOUSLY REPORTED AS >8.0   . RBC 06/27/2019 4.44  3.87 - 5.11 MIL/uL Final  . Hemoglobin 06/27/2019 13.2  12.0 - 15.0 g/dL Final  . HCT 06/27/2019 40.1  36.0 - 46.0 % Final  . MCV 06/27/2019 90.3  80.0 - 100.0 fL Final  . MCH 06/27/2019 29.7  26.0 - 34.0 pg Final  . MCHC 06/27/2019 32.9  30.0 - 36.0 g/dL Final  . RDW 06/27/2019 13.3  11.5 - 15.5 % Final  . Platelets 06/27/2019 314  150 - 400 K/uL Final  . nRBC 06/27/2019 0.0  0.0 - 0.2 % Final  . Neutrophils Relative % 06/27/2019 67  % Final  . Neutro Abs 06/27/2019 5.4  1.7 - 7.7 K/uL Final  . Lymphocytes Relative 06/27/2019 14  % Final  . Lymphs Abs 06/27/2019 1.1  0.7 - 4.0 K/uL Final  . Monocytes Relative 06/27/2019 10  % Final  . Monocytes Absolute 06/27/2019 0.8  0.1 - 1.0 K/uL Final  . Eosinophils Relative 06/27/2019 8  % Final  . Eosinophils Absolute 06/27/2019 0.7* 0.0 - 0.5 K/uL Final  . Basophils Relative 06/27/2019 1  % Final  . Basophils Absolute 06/27/2019 0.0  0.0 - 0.1 K/uL Final  . Immature Granulocytes 06/27/2019 0  % Final  . Abs Immature Granulocytes 06/27/2019 0.03  0.00 - 0.07 K/uL Final   Performed at [facility], [address], [place], [place] [address]    Assessment:  JHAYDA OBARA is a 85 y.o. female with recurrent thyroid cancer.  She was diagnosed with thyroid carcinoma in 08/2002.  She underwent thyroidectomy and lymph node dissection.  She received I-131 in 10/2002.    Her thyroid cancer recurred in 2012. CT scans in 01/2011 revealed lung nodules.  She received I-131 in 08/2011.  Follow-up scans revealed stable disease.   PET scan on 01/01/2016 revealed progressive disease, as evidenced by increased size and hypermetabolism of cervical nodes, thoracic nodes, and pulmonary nodules.  There was a foci of osseous hypermetabolism (indeterminate). Right posterior ninth rib and transverse process hypermetabolism could be posttraumatic, given suggestion of nondisplaced rib fracture.  I-131 scan on 02/16/2016 revealed no evidence of I-131 avid thyroid carcinoma.  Ultrasound guided biopsy of the left supraclavicular lymph node on 03/24/2016 revealed metastatic papillary thyroid carcinoma.  Dr Hanley Ben adjusts her Synthroid.  Chest CT on 07/02/2016 revealed stable metastatic disease to the lungs, with minimal enlargement of some pulmonary nodules, but no new pulmonary nodules. There was a new healing fracture of the anterior aspect of the left fifth rib.   PET scan on 12/31/2016 revealed generally similar appearance, with several abnormal hypermetabolic lymph nodes along the thoracic inlet level, and scattered hypermetabolic pulmonary nodules favoring the lung bases. The overall tumor burden has not significantly changed.  There was a 3.1 x 1.7 cm mass arising from the left side of the sella turcica intracranially which probably has some low-grade metabolic activity.   Chest CT on 07/04/2017 revealed a mixed response to therapy, with similar number of numerous pulmonary nodules throughout the lungs bilaterally, but with some of these nodules demonstrating slight growth (2 mm) and yet other nodules demonstrating slight regression.  PET scan on 01/17/2018 revealed overall similar appearance of scattered hypermetabolic pulmonary nodules and left paratracheal lymph nodes. Left supraclavicular nodal mass was slightly increased in size (1.7 cm) in the interval.  The sellar and left cavernous sinus mass with suprasellar extension was similar to previous exam (2.6 cm; previously 3.1 cm) compatible with previously characterized  recurrence of patient's pituitary tumor.   She received 5 cranial radiation treatments in 03/2017 in [place].  Head MRI during the interim revealed "no growth" per patient (no report available).  Chest CT on 07/04/2017 revealed a mixed response to therapy, with similar number of numerous pulmonary nodules throughout the lungs bilaterally, but with some of these nodules demonstrating slight growth (2 mm) and yet other nodules demonstrating slight regression.  PET scan on 08/07/2018 revealed the left supraclavicular and left paratracheal lymph nodes were stable. There were no new progressive findings.  There was stable sized bilateral pulmonary nodules demonstrating slight increased SUV max when compared to the prior study. There were no new nodules.  There were no enlarged or hypermetabolic mediastinal or hilar lymph nodes.  There were no findings suspicious for abdominal/pelvic metastatic disease or osseous metastatic disease.  She has had multiple epsodes of syncope in 2020.  Pacemaker was placed on03/02/2019 without improvement.  She has no seizures.  She is on Florinef for low blood pressure.  Symptomatically, she feels weak.  Exam is unremarkable.   Plan: 1.   Labs today:  CBC with diff, CMP, TSH, free T4. 2.  Thyroid cancer Clinically, she appears to be doing well. Discuss plans for annual imaging.   Schedule PET scan on 08/08/2019. 3.  Pituitary tumor Head MRI on 03/12/2019 revealed an unchanged intrasellar mass. Patient is followed in [place]. 4.   Recurrent syncope  Etiology unclear.  Patient has undergone an extensive work-up. 5.   Influenza vaccine. 6.   RTC after PET scan for MD assessment and review of imaging.  I discussed the assessment and treatment plan with the patient.  The patient was provided an opportunity to ask questions and all were answered.  The patient agreed with the plan and demonstrated an understanding of the instructions.  The patient was advised  to call back if the symptoms worsen or if the condition fails to improve as anticipated.  I provided > 25 minutes (1:45 PM - 2:23 PM) of face-to-face time during this this encounter and > 50% was spent counseling as documented under my assessment and plan.    Lequita Asal, MD, PhD    06/27/2019, 4:53 PM  I, Samul Dada, am acting as a scribe for Lequita Asal, MD.  I, [place] Mike Gip, MD, have reviewed the above documentation for accuracy and completeness, and I agree with the above.

## 2019-06-27 ENCOUNTER — Telehealth: Payer: Self-pay

## 2019-06-27 ENCOUNTER — Inpatient Hospital Stay: Payer: Medicare Other

## 2019-06-27 ENCOUNTER — Inpatient Hospital Stay: Payer: Medicare Other | Attending: Hematology and Oncology

## 2019-06-27 ENCOUNTER — Inpatient Hospital Stay (HOSPITAL_BASED_OUTPATIENT_CLINIC_OR_DEPARTMENT_OTHER): Payer: Medicare Other | Admitting: Hematology and Oncology

## 2019-06-27 ENCOUNTER — Encounter: Payer: Self-pay | Admitting: Hematology and Oncology

## 2019-06-27 VITALS — BP 151/78 | HR 65 | Temp 97.1°F | Resp 18 | Ht 60.0 in | Wt 114.7 lb

## 2019-06-27 DIAGNOSIS — Z79899 Other long term (current) drug therapy: Secondary | ICD-10-CM | POA: Diagnosis not present

## 2019-06-27 DIAGNOSIS — Z23 Encounter for immunization: Secondary | ICD-10-CM

## 2019-06-27 DIAGNOSIS — R531 Weakness: Secondary | ICD-10-CM | POA: Diagnosis not present

## 2019-06-27 DIAGNOSIS — E039 Hypothyroidism, unspecified: Secondary | ICD-10-CM | POA: Insufficient documentation

## 2019-06-27 DIAGNOSIS — C73 Malignant neoplasm of thyroid gland: Secondary | ICD-10-CM | POA: Diagnosis not present

## 2019-06-27 DIAGNOSIS — Z950 Presence of cardiac pacemaker: Secondary | ICD-10-CM | POA: Insufficient documentation

## 2019-06-27 DIAGNOSIS — R634 Abnormal weight loss: Secondary | ICD-10-CM | POA: Diagnosis not present

## 2019-06-27 DIAGNOSIS — M1990 Unspecified osteoarthritis, unspecified site: Secondary | ICD-10-CM | POA: Diagnosis not present

## 2019-06-27 DIAGNOSIS — I10 Essential (primary) hypertension: Secondary | ICD-10-CM | POA: Insufficient documentation

## 2019-06-27 DIAGNOSIS — R5381 Other malaise: Secondary | ICD-10-CM | POA: Diagnosis not present

## 2019-06-27 DIAGNOSIS — R490 Dysphonia: Secondary | ICD-10-CM | POA: Diagnosis not present

## 2019-06-27 DIAGNOSIS — K219 Gastro-esophageal reflux disease without esophagitis: Secondary | ICD-10-CM | POA: Diagnosis not present

## 2019-06-27 DIAGNOSIS — C7800 Secondary malignant neoplasm of unspecified lung: Secondary | ICD-10-CM | POA: Diagnosis not present

## 2019-06-27 DIAGNOSIS — D497 Neoplasm of unspecified behavior of endocrine glands and other parts of nervous system: Secondary | ICD-10-CM

## 2019-06-27 DIAGNOSIS — R55 Syncope and collapse: Secondary | ICD-10-CM | POA: Diagnosis not present

## 2019-06-27 LAB — COMPREHENSIVE METABOLIC PANEL
ALT: 14 U/L (ref 0–44)
AST: 21 U/L (ref 15–41)
Albumin: 3.5 g/dL (ref 3.5–5.0)
Alkaline Phosphatase: 79 U/L (ref 38–126)
Anion gap: 11 (ref 5–15)
BUN: 15 mg/dL (ref 8–23)
CO2: 23 mmol/L (ref 22–32)
Calcium: 8.7 mg/dL — ABNORMAL LOW (ref 8.9–10.3)
Chloride: 106 mmol/L (ref 98–111)
Creatinine, Ser: 0.83 mg/dL (ref 0.44–1.00)
GFR calc Af Amer: >60 mL/min (ref >60)
GFR calc non Af Amer: >60 mL/min (ref >60)
Glucose, Bld: 112 mg/dL — ABNORMAL HIGH (ref 70–99)
Potassium: 3.3 mmol/L — ABNORMAL LOW (ref 3.5–5.1)
Sodium: 140 mmol/L (ref 135–145)
Total Bilirubin: 0.5 mg/dL (ref 0.3–1.2)
Total Protein: 7.1 g/dL (ref 6.5–8.1)

## 2019-06-27 LAB — CBC WITH DIFFERENTIAL/PLATELET
Abs Immature Granulocytes: 0.03 10*3/uL (ref 0.00–0.07)
Basophils Absolute: 0 10*3/uL (ref 0.0–0.1)
Basophils Relative: 1 %
Eosinophils Absolute: 0.7 10*3/uL — ABNORMAL HIGH (ref 0.0–0.5)
Eosinophils Relative: 8 %
HCT: 40.1 % (ref 36.0–46.0)
Hemoglobin: 13.2 g/dL (ref 12.0–15.0)
Immature Granulocytes: 0 %
Lymphocytes Relative: 14 %
Lymphs Abs: 1.1 10*3/uL (ref 0.7–4.0)
MCH: 29.7 pg (ref 26.0–34.0)
MCHC: 32.9 g/dL (ref 30.0–36.0)
MCV: 90.3 fL (ref 80.0–100.0)
Monocytes Absolute: 0.8 10*3/uL (ref 0.1–1.0)
Monocytes Relative: 10 %
Neutro Abs: 5.4 10*3/uL (ref 1.7–7.7)
Neutrophils Relative %: 67 %
Platelets: 314 10*3/uL (ref 150–400)
RBC: 4.44 MIL/uL (ref 3.87–5.11)
RDW: 13.3 % (ref 11.5–15.5)
WBC: 8 10*3/uL (ref 4.0–10.5)
nRBC: 0 % (ref 0.0–0.2)

## 2019-06-27 LAB — T4, FREE: Free T4: 1.08 ng/dL (ref 0.61–1.12)

## 2019-06-27 LAB — TSH: TSH: 0.216 u[IU]/mL — ABNORMAL LOW (ref 0.350–4.500)

## 2019-06-27 MED ORDER — INFLUENZA VAC A&B SA ADJ QUAD 0.5 ML IM PRSY
0.5000 mL | PREFILLED_SYRINGE | Freq: Once | INTRAMUSCULAR | Status: AC
  Administered 2019-06-27: 0.5 mL via INTRAMUSCULAR

## 2019-06-27 NOTE — Telephone Encounter (Signed)
Laura Walters called over from the lab and report that they were have problems with the CBC's today and it has for the patient WBC <8.0. Ms Laura Walters called with correction the patient WBC is 8.0.

## 2019-06-27 NOTE — Progress Notes (Signed)
Lot # [id number] Exp 03/26/2020 incorrect for this injection   Correction : Lot # [id number] Exp: 02/07/2020 Company : [employer]

## 2019-06-27 NOTE — Progress Notes (Signed)
The patient c/o increase weakness and report she has been coughing x 1-2 months.

## 2019-06-28 ENCOUNTER — Telehealth: Payer: Self-pay

## 2019-06-28 NOTE — Telephone Encounter (Signed)
-----   Message from Lequita Asal, MD sent at 06/28/2019  1:37 PM EDT ----- Regarding: Please forward chemistries to PCP  Let patient know her potassium was slightly low.  M ----- Message ----- From: Interface, Lab In [place] Sent: 06/27/2019   1:22 PM EDT To: Lequita Asal, MD

## 2019-06-28 NOTE — Telephone Encounter (Signed)
spoke with the patient to inform her that her potassium level was slightly low. If she could eat more potassium rich food, such as ( Banana, Sweet potato / baked, Spinach. The patient was agreeable and understanding.

## 2019-06-29 ENCOUNTER — Telehealth: Payer: Self-pay | Admitting: *Deleted

## 2019-06-29 NOTE — Telephone Encounter (Signed)
Pharmacy called asking for Dr Mike Gip to refill her Advair 100/50 1 inhalation twice a day because her PCP refused to refill it for her.Please advise

## 2019-07-02 NOTE — Telephone Encounter (Signed)
Dr Doy Hutching called back and reports that patient does not keep her appointments with him and that she does not do what he suggests when she does come in, he has not seen her in over 6 months and has no follow up appointment with him. That is why they refused to refill her Advair

## 2019-07-02 NOTE — Telephone Encounter (Signed)
I have left a message with office regarding this and they are to call me back

## 2019-07-02 NOTE — Telephone Encounter (Signed)
Per pharmacist, she has not had this r fsince last September so she is not using it regularly, I told him that she needs to see her PCP to get this refill

## 2019-07-02 NOTE — Telephone Encounter (Signed)
  Please find out why PCP refused to refill.  We see her for thyroid cancer.  M

## 2019-07-02 NOTE — Telephone Encounter (Signed)
  I would suggest she follow-up with her PCP.  We could consider refilling x 1 so she can get in to see her PCP if she has been taking this and ran out.  M

## 2019-08-09 ENCOUNTER — Ambulatory Visit: Payer: Medicare Other | Admitting: Hematology and Oncology

## 2019-08-17 ENCOUNTER — Other Ambulatory Visit: Payer: Self-pay | Admitting: Hematology and Oncology

## 2019-08-17 DIAGNOSIS — C73 Malignant neoplasm of thyroid gland: Secondary | ICD-10-CM

## 2019-08-19 NOTE — Progress Notes (Signed)
This encounter was created in error - please disregard.

## 2019-08-20 ENCOUNTER — Other Ambulatory Visit: Payer: Self-pay

## 2019-08-20 DIAGNOSIS — C73 Malignant neoplasm of thyroid gland: Secondary | ICD-10-CM

## 2019-08-31 ENCOUNTER — Other Ambulatory Visit: Payer: Self-pay

## 2019-09-03 ENCOUNTER — Other Ambulatory Visit: Payer: Medicare Other

## 2019-09-03 ENCOUNTER — Ambulatory Visit: Payer: Medicare Other

## 2019-09-05 ENCOUNTER — Other Ambulatory Visit: Payer: Self-pay

## 2019-09-05 ENCOUNTER — Ambulatory Visit: Payer: Medicare Other | Admitting: Hematology and Oncology

## 2019-09-05 NOTE — Progress Notes (Signed)
[facility]  [address], [place], [place] [address] Phone: [phone]  Fax: [phone]   Telemedicine Office Visit:  09/07/2019  Referring physician: Idelle Crouch, MD  I connected with Kayren Eaves Choung on 09/07/19 at 1:35 PM by videoconferencing and verified that I was speaking with the correct person using 2 identifiers.  The patient was at home.  I discussed the limitations, risk, security and privacy concerns of performing an evaluation and management service by videoconferencing and the availability of in person appointments.  I also discussed with the patient that there may be a patient responsible charge related to this service. The patient expressed understanding and agreed to proceed.   Chief Complaint: Laura Walters is a 85 y.o. female with recurrent thyroid carcinoma and pituitary tumor who is seen for assessment after imaging   HPI: The patient was last seen in the medical oncology clinic on 06/27/2019. At that time, she felt weak. Exam was unremarkable.  CBC revealed a hematocrit of 40.1, hemoglobin 13.2, MCV 90.3, platelets 314,000, white count 8000.  Potassium was 3.3.  Calcium was 8.7 with an albumin of 3.5.  TSH was 0.216 (1.35-4.5) with a free T4 of 1.08 (0.61-1.12).  She received the influenza vaccine.  Chest CT on 09/06/2019 revealed a mild progression of pulmonary metastasis compared to 11/03/2018. There was nodal metastasis within the low left neck/thoracic inlet felt to be grossly similar to the CTA of the neck of 11/21/2018. However, this was clearly progressive when compared to a chest CT of 07/04/2017. She has a hiatal hernia.  Labs on 09/06/2019 revealed a hematocrit 40.6, hemoglobin 13.4, MCV 90.2, platelets 310,000, WBC 8200, ANC 5600.  During the interim, she notes feeling "some better"; she hasn't felt as weak.  Weight is stable; she says that she is eating alright. She has trouble chewing and swallowing. She gets strangled  easily, mainly from water/liquids and taking pills.  Symptoms started back in the summer. She has an appointment with Anda Latina on Monday 09/10/2019. Her voice has been hoarse for the past 6 months.  Her son states that she had a choking episode in 04/2019 while eating a hot dog; her voice has been hoarse since that time. She gets choked when taking pills. She has fallen once.    She denies any bone pain, but her joints hurt.   She denies left neck pain, but her daughter notes that her mother complains of left shoulder pain that radiates down her shoulder.  The patient comments that she doesn't want home health coming out to her house, but would like to go to a [facility] [facility] rehab for physical therapy.   Past Medical History:  Diagnosis Date  . Arthritis   . Asthma   . Back pain   . Cancer of thyroid ([place]) 03/30/2015  . Cardiomyopathy ([place])    idiopathic  . Chest pain    non cardiac  . Chokes    easily   swallow test negative  . Constipation   . DDD (degenerative disc disease), lumbar   . Disc disorder   . GERD (gastroesophageal reflux disease)   . H/O wheezing    advair as needed  . Hypertension   . Hypothyroidism   . IBS (irritable bowel syndrome)   . Neuropathy   . Nocturia   . RAD (reactive airway disease)   . Rectocele   . Seasonal allergies   . Spinal stenosis   . Urge incontinence   . Vaginal  atrophy   . Vaginal polyp     Past Surgical History:  Procedure Laterality Date  . BREAST BIOPSY Right 86 and 92   2 bx-neg  . CATARACT EXTRACTION W/PHACO Right 05/19/2016   Procedure: CATARACT EXTRACTION PHACO AND INTRAOCULAR LENS PLACEMENT (IOC);  Surgeon: Estill Cotta, MD;  Location: [facility] ORS;  Service: Ophthalmology;  Laterality: Right;  Lot# [id number] [place]:   1:25.3 AP%:  24.9% CDE:  40.11  . CATARACT EXTRACTION W/PHACO Left 08/11/2016   Procedure: CATARACT EXTRACTION PHACO AND INTRAOCULAR LENS PLACEMENT (IOC);  Surgeon: Estill Cotta, MD;  Location: [facility] ORS;   Service: Ophthalmology;  Laterality: Left;  [place] 1.24 AP% 26.1 CDE 37.79 FLUID PACK LOT # [id number]  . DILATION AND CURETTAGE OF UTERUS    . HEMORROIDECTOMY     x 2  . JOINT REPLACEMENT     left hip  . KNEE ARTHROPLASTY Right 01/05/2016   Procedure: COMPUTER ASSISTED TOTAL KNEE ARTHROPLASTY;  Surgeon: Dereck Leep, MD;  Location: [facility] ORS;  Service: Orthopedics;  Laterality: Right;  . KNEE ARTHROSCOPY Right 05/19/2015   Procedure: Right knee arthrosocpy medail and lateral menisectomy, chondroplasty ;  Surgeon: Dereck Leep, MD;  Location: [facility] ORS;  Service: Orthopedics;  Laterality: Right;  . Left total hip arthroplasty    . PACEMAKER INSERTION Left 12/01/2018   Procedure: INSERTION PACEMAKER Dual Chamber;  Surgeon: Isaias Cowman, MD;  Location: [facility] ORS;  Service: Cardiovascular;  Laterality: Left;  . PAROTIDECTOMY    . PITUITARY SURGERY    . THYROID SURGERY     bx  . THYROIDECTOMY      Family History  Problem Relation Age of Onset  . Heart disease Father   . Heart attack Father   . Diabetes Paternal Uncle   . Cancer Neg Hx   . Breast cancer Neg Hx     Social History:  reports that she has never smoked. She has never used smokeless tobacco. She reports that she does not drink alcohol or use drugs.  Her son [name] can be reached at [phone]. She lives at home in [place] with 24 hour care taker. The patient is accompanied by her son and Eustaquio Maize her daughter and her care taker today.  Participants in the patient's visit and their role in the encounter included the patient, her daughter, her son, patient's caregiver, Samul Dada (scribe), and Vito Berger, CMA, today.The intake visit was provided by Samul Dada, scribe, and Vito Berger, CMA.   Allergies:  Allergies  Allergen Reactions  . Acetaminophen-Codeine Other (See Comments)  . Aleve [Naproxen] Hives    Causes dizziness  . Penicillins Hives    Has patient had a PCN reaction causing immediate rash,  facial/tongue/throat swelling, SOB or lightheadedness with hypotension: hives Has patient had a PCN reaction causing severe rash involving mucus membranes or skin necrosis: no Has patient had a PCN reaction that required hospitalization no Has patient had a PCN reaction occurring within the last 10 years: no If all of the above answers are "NO", then may proceed with Cephalosporin use.  . Sulfa Antibiotics Hives  . Codeine Hives, Itching and Other (See Comments)    dizziness    Current Medications: Current Outpatient Medications  Medication Sig Dispense Refill  . acetaminophen (TYLENOL) 325 MG tablet Take 650 mg by mouth daily.    Marland Kitchen ALPRAZolam (XANAX) 0.25 MG tablet Take 1 tablet by mouth 1 day or 1 dose.    . docusate sodium (COLACE) 100 MG capsule Take 100-200 mg  by mouth 2 (two) times daily.     . DULoxetine HCl 40 MG CPEP Take 40 mg by mouth daily.     . ferrous sulfate 325 (65 FE) MG tablet Take 325 mg by mouth daily with breakfast.     . fludrocortisone (FLORINEF) 0.1 MG tablet Take 0.5 mg by mouth 1 day or 1 dose.    . gabapentin (NEURONTIN) 400 MG capsule Take 1 capsule by mouth 1 day or 1 dose.    . levETIRAcetam (KEPPRA) 500 MG tablet Take 750-1,000 mg by mouth 2 (two) times daily. Take 750 mg in the morning and 1,000 mg at night    . levothyroxine (SYNTHROID, LEVOTHROID) 88 MCG tablet Take 88 mcg by mouth daily.    Marland Kitchen MAGNESIUM PO Take 250 mg by mouth daily.     . midodrine (PROAMATINE) 10 MG tablet Take 1 tablet by mouth 3 (three) times daily.    . Multiple Vitamins-Minerals (PRESERVISION AREDS 2) CAPS Take 1 capsule by mouth 2 (two) times daily.    Marland Kitchen omeprazole (PRILOSEC) 40 MG capsule Take 40 mg by mouth 2 (two) times daily.     . potassium chloride (K-DUR,KLOR-CON) 10 MEQ tablet Take 10 mEq by mouth daily.     . traMADol (ULTRAM) 50 MG tablet Take 50 mg by mouth every 6 (six) hours as needed.     No current facility-administered medications for this visit.    Review of  Systems  Constitutional: Negative.  Negative for chills, diaphoresis, fever, malaise/fatigue and weight loss (stable).       Feels "better" then last visit.  HENT: Negative for congestion, ear pain, nosebleeds, sinus pain, sore throat and tinnitus.        Hoarse x 4-6 months.  Eyes: Negative.  Negative for blurred vision, double vision, photophobia, pain, discharge and redness.  Respiratory: Negative.  Negative for cough, hemoptysis, sputum production, shortness of breath and stridor.   Cardiovascular: Negative.  Negative for chest pain, palpitations, orthopnea, leg swelling and PND.       S/p pacemaker placement.  Gastrointestinal: Negative for abdominal pain, blood in stool, constipation, diarrhea, melena, nausea and vomiting.       Choking episodes on water and pills.  Genitourinary: Negative.  Negative for dysuria, frequency, hematuria and urgency.  Musculoskeletal: Positive for back pain (chronic), falls (h/o recurrent syncope) and joint pain. Negative for myalgias.  Skin: Negative.  Negative for itching and rash.  Neurological: Positive for weakness (generalized). Negative for dizziness, tremors, sensory change, speech change, focal weakness, seizures and headaches.  Endo/Heme/Allergies: Does not bruise/bleed easily.       S/p thyroidectomy on levothyroxine.   Psychiatric/Behavioral: Negative.  Negative for depression and memory loss. The patient is not nervous/anxious and does not have insomnia.   All other systems reviewed and are negative.  Performance status (ECOG):  2   Vitals There were no vitals taken for this visit.   Physical Exam  Constitutional: She is oriented to person, place, and time.  Elderly woman sitting comfortably at home in no acute distress.  HENT:  Head: Normocephalic and atraumatic.  Short gray hair.  Slightly hoarse.  Eyes: Conjunctivae and EOM are normal. No scleral icterus.  Blue eyes.  Neurological: She is alert and oriented to person, place, and  time.  Psychiatric: She has a normal mood and affect. Her behavior is normal. Judgment and thought content normal.  Nursing note reviewed.   Appointment on 09/06/2019  Component Date Value Ref Range Status  .  Sodium 09/06/2019 140  135 - 145 mmol/L Final  . Potassium 09/06/2019 3.9  3.5 - 5.1 mmol/L Final  . Chloride 09/06/2019 105  98 - 111 mmol/L Final  . CO2 09/06/2019 23  22 - 32 mmol/L Final  . Glucose, Bld 09/06/2019 109* 70 - 99 mg/dL Final  . BUN 09/06/2019 17  8 - 23 mg/dL Final  . Creatinine, Ser 09/06/2019 0.91  0.44 - 1.00 mg/dL Final  . Calcium 09/06/2019 9.2  8.9 - 10.3 mg/dL Final  . GFR calc non Af Amer 09/06/2019 58* >60 mL/min Final  . GFR calc Af Amer 09/06/2019 >60  >60 mL/min Final  . Anion gap 09/06/2019 12  5 - 15 Final   Performed at [facility], [address], [place], [place] [address]  . WBC 09/06/2019 8.2  4.0 - 10.5 K/uL Final  . RBC 09/06/2019 4.50  3.87 - 5.11 MIL/uL Final  . Hemoglobin 09/06/2019 13.4  12.0 - 15.0 g/dL Final  . HCT 09/06/2019 40.6  36.0 - 46.0 % Final  . MCV 09/06/2019 90.2  80.0 - 100.0 fL Final  . MCH 09/06/2019 29.8  26.0 - 34.0 pg Final  . MCHC 09/06/2019 33.0  30.0 - 36.0 g/dL Final  . RDW 09/06/2019 13.3  11.5 - 15.5 % Final  . Platelets 09/06/2019 310  150 - 400 K/uL Final  . nRBC 09/06/2019 0.0  0.0 - 0.2 % Final  . Neutrophils Relative % 09/06/2019 67  % Final  . Neutro Abs 09/06/2019 5.6  1.7 - 7.7 K/uL Final  . Lymphocytes Relative 09/06/2019 14  % Final  . Lymphs Abs 09/06/2019 1.1  0.7 - 4.0 K/uL Final  . Monocytes Relative 09/06/2019 9  % Final  . Monocytes Absolute 09/06/2019 0.7  0.1 - 1.0 K/uL Final  . Eosinophils Relative 09/06/2019 9  % Final  . Eosinophils Absolute 09/06/2019 0.7* 0.0 - 0.5 K/uL Final  . Basophils Relative 09/06/2019 1  % Final  . Basophils Absolute 09/06/2019 0.0  0.0 - 0.1 K/uL Final  . Immature Granulocytes 09/06/2019 0  % Final  . Abs Immature Granulocytes 09/06/2019 0.03   0.00 - 0.07 K/uL Final   Performed at [facility], [address] [place], [place] [address]    Assessment:  Laura Walters is a 85 y.o. female with recurrent thyroid cancer. She was diagnosed with thyroid carcinoma in 08/2002. She underwent thyroidectomy and lymph node dissection. She received I-131 in 10/2002.   Her thyroid cancer recurred in 2012. CT scans in 01/2011 revealed lung nodules. She received I-131 in 08/2011. Follow-up scans revealed stable disease.  PET scanon 04/06/2017revealed progressive disease, as evidenced by increased size and hypermetabolism of cervical nodes, thoracic nodes, and pulmonary nodules. There was a foci of osseous hypermetabolism (indeterminate). Right posterior ninth rib and transverse process hypermetabolism could be posttraumatic, given suggestion of nondisplaced rib fracture.  I-131 scanon 02/16/2016 revealed no evidence of I-131 avid thyroid carcinoma. Ultrasound guided biopsy of the left supraclavicular lymph node on 03/24/2016 revealed metastatic papillary thyroid carcinoma. Dr Hudspethadjusts her Synthroid.  Chest CTon 10/06/2017revealed stable metastatic disease to the lungs, with minimal enlargement of some pulmonary nodules, but no new pulmonary nodules. There was a new healing fracture of the anterior aspect of the left fifth rib.   PET scan on 04/06/2018revealed generally similar appearance, with several abnormal hypermetabolic lymph nodes along the thoracic inlet level, and scattered hypermetabolic pulmonary nodules favoring the lung bases. The overall tumor burden  has not significantly changed. There was a 3.1 x 1.7 cm massarising from the left side of the sella turcicaintracranially which probably has some low-grade metabolic activity.   Chest CTon 10/08/2018revealed a mixed response to therapy, with similar number of numerous pulmonary nodules throughout the lungs bilaterally, but with some of these nodules  demonstrating slight growth (2 mm) and yet other nodules demonstrating slight regression.  PET scanon 04/23/2019revealed overall similar appearance of scattered hypermetabolic pulmonary nodules and left paratracheal lymph nodes. Left supraclavicular nodal mass was slightly increased in size (1.7 cm) in the interval. The sellar and left cavernous sinus mass with suprasellar extension was similar to previous exam (2.6 cm; previously 3.1 cm) compatible with previously characterized recurrence of patient's pituitary tumor.   She received 5 cranial radiationtreatments in 03/2017 in [place]. Head MRI during the interim revealed "no growth" per patient (no report available).  Chest CTon 07/04/2017 revealed a mixed response to therapy, with similar number of numerous pulmonary nodules throughout the lungs bilaterally, but with some of these nodules demonstrating slight growth (2 mm) and yet other nodules demonstrating slight regression.  PET scanon 08/07/2018 revealed the left supraclavicular and left paratracheal lymph nodeswerestable.There were nonew progressive findings. There was stable sized bilateral pulmonary nodules demonstrating slight increased SUV max when compared to the prior study.There were nonew nodules. There were no enlarged or hypermetabolic mediastinal or hilar lymph nodes.There were no findings suspicious for abdominal/pelvic metastatic disease or osseous metastatic disease.  Chest CT on 09/06/2019 revealed a mild progression of pulmonary metastasis (1-2 mm) compared to 11/03/2018. There was nodal metastasis within the low left neck/thoracic inlet (2.4 x 2.5 cm) felt to be grossly similar to the CTA of the neck of 11/21/2018. However, this was clearly progressive when compared to a chest CT of 07/04/2017. She has a hiatal hernia.  She has had multiple epsodes of syncope in 2020.  Pacemaker was placed on03/02/2019 without improvement.  She has no seizures.  She is on  Florinef for low blood pressure.  Symptomatically, she has hoarse for 4-6 months.  She has had choking episodes on water and pills.  Plan: 1.   Labs today:  CBC with diff, CMP, TSH, free T4. 2.   Thyroid cancer She was diagnosed with thyroid cancer in 08/2002. She has had biopsy proven recurrent disease.  Disease has predominantly involved small pulmonary nodules and lymph nodes.  Disease initially I-131 avid then not avid on 02/16/2016.   Patient has subsequently been followed closely as she has had asymptomatic indolent disease.   Last biopsy on 03/24/2016- metastatic papillary thyroid carcinoma. She has become hoarse since her last visit and had had choking episodes. Chest CT on 09/06/2019 personally reviewed.  Agree with radiology interpretation.    She has multiple pulmonary nodules which have increased by 1-2 mm since 11/03/2018.    Most nodules under 1 cm.  Dominant LLL nodule 1.9 x 1.6 cm (previously 1.7 x 1.6 cm).  She has a 2.4 x 2.5 cm infiltrative soft tissue lesion within the low left neck and left side of the thoracic inlet.   Lesion involves the left common carotid and subclavian veins. Suspect left low neck lesion is the etiology of her current symptoms.  She is not a surgical candidate.  Consider IMRT/SBRT to supraclavicular nodal mass/area.  Consider sending off path from 2017 (or repeat biopsy) for NGS and potential future systemic options. Patient scheduled to see Dr Tami Ribas on 09/10/2019.  Assess upper airway and vocal cords.  Consider  swallowing evaluation. Present at tumor board on 09/13/2019. 3.Pituitary tumor Head MRI on 03/12/2019 revealed an unchanged intrasellar mass. Patient follows with physicians in [place]. 4.   Recurrent syncope             Etiology unclear.             Patient has undergone an extensive work-up. 5.   Tumor board on 09/13/2019. 6.   NP to call patient's family back next week after tumor board. 7.   RTC based on ENT evaluation  (09/10/2019) and tumor board discussions.  I discussed the assessment and treatment plan with the patient.  The patient was provided an opportunity to ask questions and all were answered.  The patient agreed with the plan and demonstrated an understanding of the instructions.  The patient was advised to call back if the symptoms worsen or if the condition fails to improve as anticipated.  I provided 22 minutes (1:35 PM - 1:56 PM) of face-to-face time during this this encounter and > 50% was spent counseling as documented under my assessment and plan.    Lequita Asal, MD, PhD    09/07/2019, 1:56 PM  I, Samul Dada, am acting as a scribe for Lequita Asal, MD.  I, [place] Mike Gip, MD, have reviewed the above documentation for accuracy and completeness, and I agree with the above.

## 2019-09-06 ENCOUNTER — Other Ambulatory Visit: Payer: Self-pay

## 2019-09-06 ENCOUNTER — Ambulatory Visit
Admission: RE | Admit: 2019-09-06 | Discharge: 2019-09-06 | Disposition: A | Discharge status: Home / Self Care | Payer: Medicare Other | Source: Ambulatory Visit | Attending: Hematology and Oncology | Admitting: Hematology and Oncology

## 2019-09-06 ENCOUNTER — Inpatient Hospital Stay: Payer: Medicare Other | Attending: Hematology and Oncology

## 2019-09-06 DIAGNOSIS — Z79899 Other long term (current) drug therapy: Secondary | ICD-10-CM | POA: Diagnosis not present

## 2019-09-06 DIAGNOSIS — Z9181 History of falling: Secondary | ICD-10-CM | POA: Diagnosis not present

## 2019-09-06 DIAGNOSIS — R1310 Dysphagia, unspecified: Secondary | ICD-10-CM | POA: Insufficient documentation

## 2019-09-06 DIAGNOSIS — R55 Syncope and collapse: Secondary | ICD-10-CM | POA: Insufficient documentation

## 2019-09-06 DIAGNOSIS — I10 Essential (primary) hypertension: Secondary | ICD-10-CM | POA: Insufficient documentation

## 2019-09-06 DIAGNOSIS — I429 Cardiomyopathy, unspecified: Secondary | ICD-10-CM | POA: Diagnosis not present

## 2019-09-06 DIAGNOSIS — R531 Weakness: Secondary | ICD-10-CM | POA: Insufficient documentation

## 2019-09-06 DIAGNOSIS — C73 Malignant neoplasm of thyroid gland: Secondary | ICD-10-CM | POA: Diagnosis present

## 2019-09-06 DIAGNOSIS — M1990 Unspecified osteoarthritis, unspecified site: Secondary | ICD-10-CM | POA: Insufficient documentation

## 2019-09-06 DIAGNOSIS — C7800 Secondary malignant neoplasm of unspecified lung: Secondary | ICD-10-CM | POA: Insufficient documentation

## 2019-09-06 DIAGNOSIS — E039 Hypothyroidism, unspecified: Secondary | ICD-10-CM | POA: Insufficient documentation

## 2019-09-06 DIAGNOSIS — J45909 Unspecified asthma, uncomplicated: Secondary | ICD-10-CM | POA: Diagnosis not present

## 2019-09-06 DIAGNOSIS — R490 Dysphonia: Secondary | ICD-10-CM | POA: Insufficient documentation

## 2019-09-06 LAB — BASIC METABOLIC PANEL
Anion gap: 12 (ref 5–15)
BUN: 17 mg/dL (ref 8–23)
CO2: 23 mmol/L (ref 22–32)
Calcium: 9.2 mg/dL (ref 8.9–10.3)
Chloride: 105 mmol/L (ref 98–111)
Creatinine, Ser: 0.91 mg/dL (ref 0.44–1.00)
GFR calc Af Amer: >60 mL/min (ref >60)
GFR calc non Af Amer: 58 mL/min — ABNORMAL LOW (ref >60)
Glucose, Bld: 109 mg/dL — ABNORMAL HIGH (ref 70–99)
Potassium: 3.9 mmol/L (ref 3.5–5.1)
Sodium: 140 mmol/L (ref 135–145)

## 2019-09-06 LAB — CBC WITH DIFFERENTIAL/PLATELET
Abs Immature Granulocytes: 0.03 10*3/uL (ref 0.00–0.07)
Basophils Absolute: 0 10*3/uL (ref 0.0–0.1)
Basophils Relative: 1 %
Eosinophils Absolute: 0.7 10*3/uL — ABNORMAL HIGH (ref 0.0–0.5)
Eosinophils Relative: 9 %
HCT: 40.6 % (ref 36.0–46.0)
Hemoglobin: 13.4 g/dL (ref 12.0–15.0)
Immature Granulocytes: 0 %
Lymphocytes Relative: 14 %
Lymphs Abs: 1.1 10*3/uL (ref 0.7–4.0)
MCH: 29.8 pg (ref 26.0–34.0)
MCHC: 33 g/dL (ref 30.0–36.0)
MCV: 90.2 fL (ref 80.0–100.0)
Monocytes Absolute: 0.7 10*3/uL (ref 0.1–1.0)
Monocytes Relative: 9 %
Neutro Abs: 5.6 10*3/uL (ref 1.7–7.7)
Neutrophils Relative %: 67 %
Platelets: 310 10*3/uL (ref 150–400)
RBC: 4.5 MIL/uL (ref 3.87–5.11)
RDW: 13.3 % (ref 11.5–15.5)
WBC: 8.2 10*3/uL (ref 4.0–10.5)
nRBC: 0 % (ref 0.0–0.2)

## 2019-09-06 MED ORDER — IOHEXOL 300 MG/ML  SOLN
75.0000 mL | Freq: Once | INTRAMUSCULAR | Status: AC | PRN
  Administered 2019-09-06: 12:00:00 75 mL via INTRAVENOUS

## 2019-09-07 ENCOUNTER — Inpatient Hospital Stay (HOSPITAL_BASED_OUTPATIENT_CLINIC_OR_DEPARTMENT_OTHER): Payer: Medicare Other | Admitting: Hematology and Oncology

## 2019-09-07 ENCOUNTER — Encounter: Payer: Self-pay | Admitting: Hematology and Oncology

## 2019-09-07 DIAGNOSIS — D497 Neoplasm of unspecified behavior of endocrine glands and other parts of nervous system: Secondary | ICD-10-CM | POA: Diagnosis not present

## 2019-09-07 DIAGNOSIS — C73 Malignant neoplasm of thyroid gland: Secondary | ICD-10-CM

## 2019-09-07 DIAGNOSIS — C7800 Secondary malignant neoplasm of unspecified lung: Secondary | ICD-10-CM | POA: Diagnosis not present

## 2019-09-07 DIAGNOSIS — R490 Dysphonia: Secondary | ICD-10-CM

## 2019-09-11 ENCOUNTER — Other Ambulatory Visit: Payer: Self-pay | Admitting: Unknown Physician Specialty

## 2019-09-11 DIAGNOSIS — J3801 Paralysis of vocal cords and larynx, unilateral: Secondary | ICD-10-CM

## 2019-09-11 DIAGNOSIS — Z85850 Personal history of malignant neoplasm of thyroid: Secondary | ICD-10-CM

## 2019-09-11 DIAGNOSIS — J9589 Other postprocedural complications and disorders of respiratory system, not elsewhere classified: Secondary | ICD-10-CM

## 2019-09-13 ENCOUNTER — Other Ambulatory Visit: Payer: Medicare Other

## 2019-09-13 DIAGNOSIS — R490 Dysphonia: Secondary | ICD-10-CM | POA: Insufficient documentation

## 2019-09-13 NOTE — Progress Notes (Signed)
Tumor Board Documentation  Kennede Prosen Demarest was presented by Dr Janese Banks at our Tumor Board on 09/13/2019, which included representatives from medical oncology, radiation oncology, navigation, pathology, radiology, surgical, surgical oncology, internal medicine, genetics, pulmonology, palliative care, research.  Donne currently presents as a current patient, for discussion with history of the following treatments: immunotherapy, surgical intervention(s).  Additionally, we reviewed previous medical and familial history, history of present illness, and recent lab results along with all available histopathologic and imaging studies. The tumor board considered available treatment options and made the following recommendations: Radiation therapy (primary modality)    The following procedures/referrals were also placed: No orders of the defined types were placed in this encounter.   Clinical Trial Status: not discussed   Staging used:    AJCC Staging:       Group: History of Thyroid Cancer   National site-specific guidelines   were discussed with respect to the case.  Tumor board is a meeting of clinicians from various specialty areas who evaluate and discuss patients for whom a multidisciplinary approach is being considered. Final determinations in the plan of care are those of the provider(s). The responsibility for follow up of recommendations given during tumor board is that of the provider.   Today's extended care, comprehensive team conference, [place] was not present for the discussion and was not examined.   Multidisciplinary Tumor Board is a multidisciplinary case peer review process.  Decisions discussed in the Multidisciplinary Tumor Board reflect the opinions of the specialists present at the conference without having examined the patient.  Ultimately, treatment and diagnostic decisions rest with the primary provider(s) and the patient.

## 2019-09-14 ENCOUNTER — Ambulatory Visit
Admission: RE | Admit: 2019-09-14 | Discharge: 2019-09-14 | Disposition: A | Discharge status: Home / Self Care | Payer: Medicare Other | Source: Ambulatory Visit | Attending: Unknown Physician Specialty | Admitting: Unknown Physician Specialty

## 2019-09-14 ENCOUNTER — Other Ambulatory Visit: Payer: Self-pay

## 2019-09-14 DIAGNOSIS — R1312 Dysphagia, oropharyngeal phase: Secondary | ICD-10-CM

## 2019-09-14 DIAGNOSIS — Z85850 Personal history of malignant neoplasm of thyroid: Secondary | ICD-10-CM | POA: Diagnosis present

## 2019-09-14 DIAGNOSIS — J9589 Other postprocedural complications and disorders of respiratory system, not elsewhere classified: Secondary | ICD-10-CM | POA: Insufficient documentation

## 2019-09-14 DIAGNOSIS — J3801 Paralysis of vocal cords and larynx, unilateral: Secondary | ICD-10-CM | POA: Diagnosis present

## 2019-09-14 NOTE — Therapy (Signed)
[facility] [place], [name], [address] Phone: [phone]   Fax:     Modified Barium Swallow  Patient Details  Name: Laura Walters MRN: [id number] Date of Birth: 1932-12-06 No data recorded  Encounter Date: 09/14/2019  End of Session - 09/14/19 1533    Visit Number  1    Number of Visits  1    Date for SLP Re-Evaluation  09/14/19    SLP Start Time  1    SLP Stop Time   1430    SLP Time Calculation (min)  45 min    Activity Tolerance  Patient tolerated treatment well       Past Medical History:  Diagnosis Date  . Arthritis   . Asthma   . Back pain   . Cancer of thyroid ([place]) 03/30/2015  . Cardiomyopathy ([place])    idiopathic  . Chest pain    non cardiac  . Chokes    easily   swallow test negative  . Constipation   . DDD (degenerative disc disease), lumbar   . Disc disorder   . GERD (gastroesophageal reflux disease)   . H/O wheezing    advair as needed  . Hypertension   . Hypothyroidism   . IBS (irritable bowel syndrome)   . Neuropathy   . Nocturia   . RAD (reactive airway disease)   . Rectocele   . Seasonal allergies   . Spinal stenosis   . Urge incontinence   . Vaginal atrophy   . Vaginal polyp     Past Surgical History:  Procedure Laterality Date  . BREAST BIOPSY Right 86 and 92   2 bx-neg  . CATARACT EXTRACTION W/PHACO Right 05/19/2016   Procedure: CATARACT EXTRACTION PHACO AND INTRAOCULAR LENS PLACEMENT (IOC);  Surgeon: Estill Cotta, MD;  Location: [facility];  Service: Ophthalmology;  Laterality: Right;  Lot# [id number] H [place]:   1:25.3 AP%:  24.9% CDE:  40.11  . CATARACT EXTRACTION W/PHACO Left 08/11/2016   Procedure: CATARACT EXTRACTION PHACO AND INTRAOCULAR LENS PLACEMENT (IOC);  Surgeon: Estill Cotta, MD;  Location: [facility];  Service: Ophthalmology;  Laterality: Left;  [place] 1.24 AP% 26.1 CDE 37.79 FLUID PACK LOT # [id number]  . DILATION AND CURETTAGE OF UTERUS    .  HEMORROIDECTOMY     x 2  . JOINT REPLACEMENT     left hip  . KNEE ARTHROPLASTY Right 01/05/2016   Procedure: COMPUTER ASSISTED TOTAL KNEE ARTHROPLASTY;  Surgeon: Dereck Leep, MD;  Location: [facility];  Service: Orthopedics;  Laterality: Right;  . KNEE ARTHROSCOPY Right 05/19/2015   Procedure: Right knee arthrosocpy medail and lateral menisectomy, chondroplasty ;  Surgeon: Dereck Leep, MD;  Location: [facility];  Service: Orthopedics;  Laterality: Right;  . Left total hip arthroplasty    . PACEMAKER INSERTION Left 12/01/2018   Procedure: INSERTION PACEMAKER Dual Chamber;  Surgeon: Isaias Cowman, MD;  Location: [facility];  Service: Cardiovascular;  Laterality: Left;  . PAROTIDECTOMY    . PITUITARY SURGERY    . THYROID SURGERY     bx  . THYROIDECTOMY      There were no vitals filed for this visit.   Subjective: Patient behavior: (alertness, ability to follow instructions, etc.): The patient is alert, able to verbalize swallowing history, and follow directions  Chief complaint: Left vocal fold paralysis, self-report aspiration events   Objective:  Radiological Procedure: A videoflouroscopic evaluation of oral-preparatory, reflex initiation, and pharyngeal phases of the swallow  was performed; as well as a screening of the upper esophageal phase.  I. POSTURE: Upright in MBS chair  II. VIEW: Lateral  III. COMPENSATORY STRATEGIES: N/A  IV. BOLUSES ADMINISTERED:   Thin Liquid: 1 small, 3 rapid consecutive   Nectar-thick Liquid: 1 moderate   Honey-thick Liquid: DNT   Puree: 2 teaspoon presentations   Mechanical Soft: 1/4 graham cracker in applesauce  V. RESULTS OF EVALUATION: A. ORAL PREPARATORY PHASE: (The lips, tongue, and velum are observed for strength and coordination)       **Overall Severity Rating: within normal limits   B. SWALLOW INITIATION/REFLEX: (The reflex is normal if "triggered" by the time the bolus reached the base of the tongue)  **Overall Severity  Rating: Mild; triggers while falling from the valleculae to the pyriform sinuses  C. PHARYNGEAL PHASE: (Pharyngeal function is normal if the bolus shows rapid, smooth, and continuous transit through the pharynx and there is no pharyngeal residue after the swallow)  **Overall Severity Rating: within normal limits   D. LARYNGEAL PENETRATION: (Material entering into the laryngeal inlet/vestibule but not aspirated) TRANSIENT laryngeal penetration 2 of 5 liquid trials  E. ASPIRATION: None  F. ESOPHAGEAL PHASE: (Screening of the upper esophagus) In the cervical esophagus there is a finger-like protrusion along the posterior wall during swallow (does not impede flow of boluses) consistent with prominent cricopharyngeus.    ASSESSMENT: This 85 year old woman; with left vocal fold paralysis; is presenting with mild oropharyngeal dysphagia characterized by delayed pharyngeal swallow initiation and inconsistent TRANSIENT laryngeal penetration with liquids (thin and nectar-thick).  Oral control of the bolus including oral hold, rotary mastication, and anterior to posterior transfer is within functional limits.   Aspects of the pharyngeal stage of swallowing including tongue base retraction, hyolaryngeal excursion, epiglottic inversion, and duration/amplitude of UES opening are within normal limits.  There is no observed pharyngeal residue or tracheal aspiration.  The patient does not appear to be significant risk for prandial aspiration.  The patient reports occasional aspiration events with strong cough response and is encouraged to cough when needed.    PLAN/RECOMMENDATIONS:   A. Diet: Regular with thin liquids   B. Swallowing Precautions: Standard   C. Recommended consultation to: follow up with MDs as recommended   D. Therapy recommendations: The patient is scheduled to begin outpatient voice therapy later this month   E. Results and recommendations were discussed with the patient and her son  immediately following the study and the final report routed to the referring MD.  Patient will benefit from skilled therapeutic intervention in order to improve the following deficits and impairments:   Dysphagia, oropharyngeal phase  Paralysis of vocal cords and larynx, unilateral - Plan: DG OP Swallowing Func-Medicare/Speech Path, DG OP Swallowing Func-Medicare/Speech Path  Personal history of malignant neoplasm of thyroid - Plan: DG OP Swallowing Func-Medicare/Speech Path, DG OP Swallowing Func-Medicare/Speech Path  Other postoperative complication involving respiratory system - Plan: DG OP Swallowing Func-Medicare/Speech Path, DG OP Swallowing Func-Medicare/Speech Path        Problem List Patient Active Problem List   Diagnosis Date Noted  . Hoarse 09/13/2019  . Pituitary adenoma ([place]) 06/26/2019  . Orthostatic hypotension 04/01/2019  . Seizure-like activity ([place]) 03/06/2019  . Syncope and collapse 01/09/2019  . Bradycardia 11/21/2018  . Bradycardia, unspecified 11/21/2018  . NSTEMI (non-ST elevated myocardial infarction) ([place]) 11/18/2018  . Syncope 11/03/2018  . Vaginal bleeding 08/02/2018  . Abrasion of vagina 08/02/2018  . Goals of care, counseling/discussion 01/05/2018  . Uterine prolapse 02/03/2017  .  GERD (gastroesophageal reflux disease) 11/11/2016  . Pulmonary metastases ([place]) 04/09/2016  . Reactive airway disease 02/27/2016  . Recurrent sinus infections 01/31/2016  . H/O total knee replacement 01/20/2016  . S/P total knee arthroplasty 01/05/2016  . 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 Arthropathy, traumatic, knee 12/16/2015  . Cystocele, midline 12/02/2015  . Rectocele 12/02/2015  . Frequent UTI 11/26/2015  . Acquired hypothyroidism 10/13/2015  . Pituitary tumor 10/13/2015  . Degeneration of intervertebral disc of lumbar region 08/01/2015  . Lumbar canal stenosis 08/01/2015  . Neuritis or radiculitis due to rupture of lumbar  intervertebral disc 08/01/2015  . LBP (low back pain) 03/30/2015  . Neuropathy ([place]) 03/30/2015  . Chest pain, non-cardiac 03/30/2015  . Neoplasm of pituitary gland 03/30/2015  . Cancer of thyroid ([place]) 03/30/2015  . Combined fat and carbohydrate induced hyperlipemia 10/17/2014  . Primary cardiomyopathy ([place]) 10/14/2014   Leroy Sea, MS/CCC- SLP  Lou Miner 09/14/2019, 3:35 PM  [facility] [place], [name], [address] Phone: [phone]   Fax:     Name: Laura Walters MRN: [id number] Date of Birth: 1933/04/16

## 2019-09-18 ENCOUNTER — Ambulatory Visit: Payer: Medicare Other | Attending: Otolaryngology | Admitting: Speech Pathology

## 2019-09-18 ENCOUNTER — Other Ambulatory Visit: Payer: Self-pay | Admitting: Hematology and Oncology

## 2019-09-18 ENCOUNTER — Telehealth: Payer: Self-pay | Admitting: Hematology and Oncology

## 2019-09-18 DIAGNOSIS — M25512 Pain in left shoulder: Secondary | ICD-10-CM

## 2019-09-18 DIAGNOSIS — M79622 Pain in left upper arm: Secondary | ICD-10-CM

## 2019-09-18 NOTE — Telephone Encounter (Signed)
Re:  Tumor board  Discussed with patient plan for palliative radiation.  Patient has an appointment with Dr Baruch Gouty on 09/26/2019.  She inquired about plain films of the left shoulder and forearm.  Order placed.  If etiology of shoulder pain unclear, she may require further imaging.   Lequita Asal, MD

## 2019-09-19 ENCOUNTER — Ambulatory Visit: Payer: Medicare Other

## 2019-09-24 ENCOUNTER — Ambulatory Visit: Payer: Medicare Other | Admitting: Speech Pathology

## 2019-09-26 ENCOUNTER — Ambulatory Visit: Payer: Medicare Other | Admitting: Radiation Oncology

## 2019-09-26 ENCOUNTER — Ambulatory Visit: Payer: Medicare Other | Admitting: Speech Pathology

## 2019-10-01 ENCOUNTER — Encounter: Payer: Medicare Other | Admitting: Speech Pathology

## 2019-10-03 ENCOUNTER — Ambulatory Visit: Payer: Medicare Other | Admitting: Speech Pathology

## 2019-10-08 ENCOUNTER — Ambulatory Visit: Payer: Medicare Other | Admitting: Speech Pathology

## 2019-10-10 ENCOUNTER — Ambulatory Visit: Payer: Medicare Other | Admitting: Speech Pathology

## 2019-10-10 ENCOUNTER — Other Ambulatory Visit: Payer: Self-pay

## 2019-10-10 ENCOUNTER — Other Ambulatory Visit: Payer: Self-pay | Admitting: *Deleted

## 2019-10-10 ENCOUNTER — Ambulatory Visit
Admission: RE | Admit: 2019-10-10 | Discharge: 2019-10-10 | Disposition: A | Discharge status: Home / Self Care | Payer: Medicare Other | Source: Ambulatory Visit | Attending: Radiation Oncology | Admitting: Radiation Oncology

## 2019-10-10 DIAGNOSIS — C779 Secondary and unspecified malignant neoplasm of lymph node, unspecified: Secondary | ICD-10-CM | POA: Diagnosis not present

## 2019-10-10 DIAGNOSIS — C7800 Secondary malignant neoplasm of unspecified lung: Secondary | ICD-10-CM | POA: Diagnosis not present

## 2019-10-10 DIAGNOSIS — J45909 Unspecified asthma, uncomplicated: Secondary | ICD-10-CM | POA: Insufficient documentation

## 2019-10-10 DIAGNOSIS — M129 Arthropathy, unspecified: Secondary | ICD-10-CM | POA: Insufficient documentation

## 2019-10-10 DIAGNOSIS — C73 Malignant neoplasm of thyroid gland: Secondary | ICD-10-CM | POA: Insufficient documentation

## 2019-10-10 DIAGNOSIS — I10 Essential (primary) hypertension: Secondary | ICD-10-CM | POA: Diagnosis not present

## 2019-10-10 DIAGNOSIS — E039 Hypothyroidism, unspecified: Secondary | ICD-10-CM | POA: Diagnosis not present

## 2019-10-10 DIAGNOSIS — K219 Gastro-esophageal reflux disease without esophagitis: Secondary | ICD-10-CM | POA: Diagnosis not present

## 2019-10-10 DIAGNOSIS — Z79899 Other long term (current) drug therapy: Secondary | ICD-10-CM | POA: Insufficient documentation

## 2019-10-10 NOTE — Consult Note (Signed)
NEW PATIENT EVALUATION  Name: Laura Walters  MRN: [id number]  Date:   10/10/2019     DOB: [birth year]   This 86 y.o. female patient presents to the clinic for initial evaluation of recurrent thyroid cancer with progression in the left thoracic inlet now causing hoarseness secondary to left recurrent laryngeal nerve involvement.  REFERRING PHYSICIAN: Idelle Crouch, MD  CHIEF COMPLAINT: No chief complaint on file.   DIAGNOSIS: The encounter diagnosis was Cancer of thyroid ([place]).   PREVIOUS INVESTIGATIONS:  CT scans and PET CT scans reviewed Pathology report reviewed Clinical notes reviewed  HPI: Patient is an elderly pleasant 86 year old female diagnosed in 2003 with thyroid cancer.  She now has progression of disease with prominently involved small pulmonary nodules and lymph nodes.  Tumor was papillary thyroid carcinoma.  She was biopsied in 2017 and noted to have metastatic papillary thyroid carcinoma.  She is becoming increasingly hoarse repeat CT scan in December shows again multiple pulmonary nodule slowly increasing in size by 1 to 2 mm as well as a 2.4 x 2.5 cm soft tissue lesion within the low left neck and left side of the thoracic inlet.  Lesion involves the left common carotid subclavian veins.  This was previously avid on PET CT scan over a year ago.  She specifically denies dysphagia or cough.  I been asked to evaluate her for possible palliative radiation therapy to this lesion.  PLANNED TREATMENT REGIMEN: Possible SBRT  PAST MEDICAL HISTORY:  has a past medical history of Arthritis, Asthma, Back pain, Cancer of thyroid ([place]) (03/30/2015), Cardiomyopathy ([place]), Chest pain, Chokes, Constipation, DDD (degenerative disc disease), lumbar, Disc disorder, GERD (gastroesophageal reflux disease), H/O wheezing, Hypertension, Hypothyroidism, IBS (irritable bowel syndrome), Neuropathy, Nocturia, RAD (reactive airway disease), Rectocele, Seasonal allergies, Spinal stenosis, Urge  incontinence, Vaginal atrophy, and Vaginal polyp.    PAST SURGICAL HISTORY:  Past Surgical History:  Procedure Laterality Date  . BREAST BIOPSY Right 86 and 92   2 bx-neg  . CATARACT EXTRACTION W/PHACO Right 05/19/2016   Procedure: CATARACT EXTRACTION PHACO AND INTRAOCULAR LENS PLACEMENT (IOC);  Surgeon: Estill Cotta, MD;  Location: [facility];  Service: Ophthalmology;  Laterality: Right;  Lot# [id number] H [place]:   1:25.3 AP%:  24.9% CDE:  40.11  . CATARACT EXTRACTION W/PHACO Left 08/11/2016   Procedure: CATARACT EXTRACTION PHACO AND INTRAOCULAR LENS PLACEMENT (IOC);  Surgeon: Estill Cotta, MD;  Location: [facility];  Service: Ophthalmology;  Laterality: Left;  [place] 1.24 AP% 26.1 CDE 37.79 FLUID PACK LOT # [id number]  . DILATION AND CURETTAGE OF UTERUS    . HEMORROIDECTOMY     x 2  . JOINT REPLACEMENT     left hip  . KNEE ARTHROPLASTY Right 01/05/2016   Procedure: COMPUTER ASSISTED TOTAL KNEE ARTHROPLASTY;  Surgeon: Dereck Leep, MD;  Location: [facility];  Service: Orthopedics;  Laterality: Right;  . KNEE ARTHROSCOPY Right 05/19/2015   Procedure: Right knee arthrosocpy medail and lateral menisectomy, chondroplasty ;  Surgeon: Dereck Leep, MD;  Location: [facility];  Service: Orthopedics;  Laterality: Right;  . Left total hip arthroplasty    . PACEMAKER INSERTION Left 12/01/2018   Procedure: INSERTION PACEMAKER Dual Chamber;  Surgeon: Isaias Cowman, MD;  Location: [facility];  Service: Cardiovascular;  Laterality: Left;  . PAROTIDECTOMY    . PITUITARY SURGERY    . THYROID SURGERY     bx  . THYROIDECTOMY      FAMILY HISTORY: family history includes Diabetes in her paternal uncle; Heart attack in  her father; Heart disease in her father.  SOCIAL HISTORY:  reports that she has never smoked. She has never used smokeless tobacco. She reports that she does not drink alcohol or use drugs.  ALLERGIES: Acetaminophen-codeine, Aleve [naproxen], Penicillins, Sulfa antibiotics, and  Codeine  MEDICATIONS:  Current Outpatient Medications  Medication Sig Dispense Refill  . acetaminophen (TYLENOL) 325 MG tablet Take 650 mg by mouth daily.    Marland Kitchen ALPRAZolam (XANAX) 0.25 MG tablet Take 1 tablet by mouth 1 day or 1 dose.    . docusate sodium (COLACE) 100 MG capsule Take 100-200 mg by mouth 2 (two) times daily.     . DULoxetine HCl 40 MG CPEP Take 40 mg by mouth daily.     . ferrous sulfate 325 (65 FE) MG tablet Take 325 mg by mouth daily with breakfast.     . fludrocortisone (FLORINEF) 0.1 MG tablet Take 0.5 mg by mouth 1 day or 1 dose.    . gabapentin (NEURONTIN) 400 MG capsule Take 1 capsule by mouth 1 day or 1 dose.    . levETIRAcetam (KEPPRA) 500 MG tablet Take 750-1,000 mg by mouth 2 (two) times daily. Take 750 mg in the morning and 1,000 mg at night    . levothyroxine (SYNTHROID, LEVOTHROID) 88 MCG tablet Take 88 mcg by mouth daily.    Marland Kitchen MAGNESIUM PO Take 250 mg by mouth daily.     . midodrine (PROAMATINE) 10 MG tablet Take 1 tablet by mouth 3 (three) times daily.    . Multiple Vitamins-Minerals (PRESERVISION AREDS 2) CAPS Take 1 capsule by mouth 2 (two) times daily.    Marland Kitchen omeprazole (PRILOSEC) 40 MG capsule Take 40 mg by mouth 2 (two) times daily.     . potassium chloride (K-DUR,KLOR-CON) 10 MEQ tablet Take 10 mEq by mouth daily.     . traMADol (ULTRAM) 50 MG tablet Take 50 mg by mouth every 6 (six) hours as needed.     No current facility-administered medications for this encounter.    ECOG PERFORMANCE STATUS:  2 - Symptomatic, <50% confined to bed  REVIEW OF SYSTEMS: Patient denies any weight loss, fatigue, weakness, fever, chills or night sweats. Patient denies any loss of vision, blurred vision. Patient denies any ringing  of the ears or hearing loss. No irregular heartbeat. Patient denies heart murmur or history of fainting. Patient denies any chest pain or pain radiating to her upper extremities. Patient denies any shortness of breath, difficulty breathing at  night, cough or hemoptysis. Patient denies any swelling in the lower legs. Patient denies any nausea vomiting, vomiting of blood, or coffee ground material in the vomitus. Patient denies any stomach pain. Patient states has had normal bowel movements no significant constipation or diarrhea. Patient denies any dysuria, hematuria or significant nocturia. Patient denies any problems walking, swelling in the joints or loss of balance. Patient denies any skin changes, loss of hair or loss of weight. Patient denies any excessive worrying or anxiety or significant depression. Patient denies any problems with insomnia. Patient denies excessive thirst, polyuria, polydipsia. Patient denies any swollen glands, patient denies easy bruising or easy bleeding. Patient denies any recent infections, allergies or URI. Patient "s visual fields have not changed significantly in recent time.   PHYSICAL EXAM: There were no vitals taken for this visit. No evidence of mass is detected on palpation of her left supraclavicular inlet.  Well-developed well-nourished patient in NAD. HEENT reveals PERLA, EOMI, discs not visualized.  Oral cavity is clear. No  oral mucosal lesions are identified. Neck is clear without evidence of cervical or supraclavicular adenopathy. Lungs are clear to A&P. Cardiac examination is essentially unremarkable with regular rate and rhythm without murmur rub or thrill. Abdomen is benign with no organomegaly or masses noted. Motor sensory and DTR levels are equal and symmetric in the upper and lower extremities. Cranial nerves II through XII are grossly intact. Proprioception is intact. No peripheral adenopathy or edema is identified. No motor or sensory levels are noted. Crude visual fields are within normal range.  LABORATORY DATA: Labs and prior pathology reviewed   RADIOLOGY RESULTS: PET/CT and CT scans reviewed trying to authorize new PET CT scan.   IMPRESSION: Progressive metastatic papillary thyroid  carcinoma in the left thoracic inlet in 86 year old female with left recurrent laryngeal nerve involvement and encasement of her left common and subclavian veins in 86 year old female  PLAN: At this time would like to obtain another PET CT scan for better delineation of the lesion in the left thoracic inlet.  I believe a palliative course of SBRT 3000 cGy in 5 fractions could prevent further progression of her disease and possible encroachment on her airway and/or esophagus.  I have explained to her family including her daughter who works in radiation in [place] that again this would not improve her vocal cord paralysis since that is nerve damage and would probably need some assistance with ENT with possible Botox injection of her cords to increase her vocal abilities.  We will try to preauthorize her PET CT scan and I will see her in follow-up shortly thereafter.  If PET CT scan is declined will discuss further with the family their inclination to go ahead with treatment.  They all know to contact me with any concerns at this time.  Son and daughter by phone were involved in the consultation.  I would like to take this opportunity to thank you for allowing me to participate in the care of your patient.Noreene Filbert, MD

## 2019-10-15 ENCOUNTER — Ambulatory Visit: Payer: Medicare Other | Admitting: Speech Pathology

## 2019-10-17 ENCOUNTER — Ambulatory Visit: Payer: Medicare Other | Admitting: Speech Pathology

## 2019-10-22 ENCOUNTER — Ambulatory Visit: Payer: Medicare Other | Admitting: Speech Pathology

## 2019-10-24 ENCOUNTER — Ambulatory Visit
Admission: RE | Admit: 2019-10-24 | Discharge: 2019-10-24 | Disposition: A | Discharge status: Home / Self Care | Payer: Medicare Other | Source: Ambulatory Visit | Attending: Radiation Oncology | Admitting: Radiation Oncology

## 2019-10-24 ENCOUNTER — Other Ambulatory Visit: Payer: Self-pay

## 2019-10-24 ENCOUNTER — Ambulatory Visit: Payer: Medicare Other | Admitting: Speech Pathology

## 2019-10-24 DIAGNOSIS — C770 Secondary and unspecified malignant neoplasm of lymph nodes of head, face and neck: Secondary | ICD-10-CM | POA: Diagnosis not present

## 2019-10-24 DIAGNOSIS — K449 Diaphragmatic hernia without obstruction or gangrene: Secondary | ICD-10-CM | POA: Diagnosis not present

## 2019-10-24 DIAGNOSIS — C7800 Secondary malignant neoplasm of unspecified lung: Secondary | ICD-10-CM | POA: Diagnosis not present

## 2019-10-24 DIAGNOSIS — Z79899 Other long term (current) drug therapy: Secondary | ICD-10-CM | POA: Diagnosis not present

## 2019-10-24 DIAGNOSIS — C73 Malignant neoplasm of thyroid gland: Secondary | ICD-10-CM | POA: Insufficient documentation

## 2019-10-24 DIAGNOSIS — J329 Chronic sinusitis, unspecified: Secondary | ICD-10-CM | POA: Diagnosis not present

## 2019-10-24 LAB — GLUCOSE, CAPILLARY: Glucose-Capillary: 89 mg/dL (ref 70–99)

## 2019-10-24 MED ORDER — FLUDEOXYGLUCOSE F - 18 (FDG) INJECTION
6.5700 | Freq: Once | INTRAVENOUS | Status: AC | PRN
  Administered 2019-10-24: 13:00:00 6.57 via INTRAVENOUS

## 2019-10-25 ENCOUNTER — Ambulatory Visit
Admission: RE | Admit: 2019-10-25 | Discharge: 2019-10-25 | Disposition: A | Discharge status: Home / Self Care | Payer: Medicare Other | Source: Ambulatory Visit | Attending: Radiation Oncology | Admitting: Radiation Oncology

## 2019-10-25 ENCOUNTER — Encounter: Payer: Self-pay | Admitting: Radiation Oncology

## 2019-10-25 ENCOUNTER — Other Ambulatory Visit: Payer: Self-pay

## 2019-10-25 DIAGNOSIS — R918 Other nonspecific abnormal finding of lung field: Secondary | ICD-10-CM | POA: Insufficient documentation

## 2019-10-25 DIAGNOSIS — Z85820 Personal history of malignant melanoma of skin: Secondary | ICD-10-CM | POA: Diagnosis not present

## 2019-10-25 DIAGNOSIS — C73 Malignant neoplasm of thyroid gland: Secondary | ICD-10-CM | POA: Insufficient documentation

## 2019-10-25 NOTE — Progress Notes (Signed)
Radiation Oncology Follow up Note  Name: Laura Walters   Date:   10/25/2019 MRN:  [id number] DOB: 28-Oct-1932    This 86 y.o. female presents to the clinic today for follow-up after PET CT scan of a patient with known stage IV recurrent thyroid cancer with progression in the left thoracic inlet.  REFERRING PROVIDER: Idelle Crouch, MD  HPI: Patient is a 86 year old female originally consulted back January 13.  She had thyroid cancer back in 2003.  She has developed metastatic disease with pulmonary nodules as well as a progressive cervical node in the left thoracic inlet.  I had a PET CT scan performed which showed pulmonary to assess is minimally increased in size since 2019 she does have a new and progressive cervical nodal metastasis.  She has left recurrent laryngeal nerve paralysis causing hoarseness.  Her CT scan also demonstrated involvement of left common carotid and subclavian arteries.  COMPLICATIONS OF TREATMENT: none  FOLLOW UP COMPLIANCE: keeps appointments   PHYSICAL EXAM:  BP (P) 134/72 (BP Location: Right Arm, Patient Position: Sitting)   Pulse (P) 68   Resp (P) 16  Well-developed well-nourished patient in NAD. HEENT reveals PERLA, EOMI, discs not visualized.  Oral cavity is clear. No oral mucosal lesions are identified. Neck is clear without evidence of cervical or supraclavicular adenopathy. Lungs are clear to A&P. Cardiac examination is essentially unremarkable with regular rate and rhythm without murmur rub or thrill. Abdomen is benign with no organomegaly or masses noted. Motor sensory and DTR levels are equal and symmetric in the upper and lower extremities. Cranial nerves II through XII are grossly intact. Proprioception is intact. No peripheral adenopathy or edema is identified. No motor or sensory levels are noted. Crude visual fields are within normal range.  RADIOLOGY RESULTS: PET CT scan reviewed and compatible with above-stated findings  PLAN: At this time like  to go ahead with palliative radiation therapy to the left lower cervical nodal area hypermetabolic on PET CT scan.  I would plan on delivering 3000 cGy in 5 fractions using SBRT.  Risks and benefits of treatment including mild dysphagia possibly from radiation esophagitis fatigue skin reaction all were discussed with the patient and her family.  They all seem to comprehend my treatment plan well.  I personally set up and ordered CT simulation for next week.  I would like to take this opportunity to thank you for allowing me to participate in the care of your patient.Noreene Filbert, MD

## 2019-10-30 ENCOUNTER — Ambulatory Visit
Admission: RE | Admit: 2019-10-30 | Discharge: 2019-10-30 | Disposition: A | Discharge status: Home / Self Care | Payer: Medicare Other | Source: Ambulatory Visit | Attending: Radiation Oncology | Admitting: Radiation Oncology

## 2019-10-30 ENCOUNTER — Other Ambulatory Visit: Payer: Self-pay

## 2019-10-30 DIAGNOSIS — Z510 Encounter for antineoplastic radiation therapy: Secondary | ICD-10-CM | POA: Diagnosis present

## 2019-10-30 DIAGNOSIS — C73 Malignant neoplasm of thyroid gland: Secondary | ICD-10-CM | POA: Insufficient documentation

## 2019-11-06 ENCOUNTER — Ambulatory Visit: Payer: Medicare Other

## 2019-11-08 ENCOUNTER — Ambulatory Visit: Payer: Medicare Other

## 2019-11-13 ENCOUNTER — Other Ambulatory Visit: Payer: Self-pay

## 2019-11-13 ENCOUNTER — Ambulatory Visit: Payer: Medicare Other

## 2019-11-13 ENCOUNTER — Other Ambulatory Visit: Payer: Self-pay | Admitting: *Deleted

## 2019-11-13 DIAGNOSIS — Z510 Encounter for antineoplastic radiation therapy: Secondary | ICD-10-CM | POA: Diagnosis not present

## 2019-11-13 MED ORDER — CIPROFLOXACIN HCL 500 MG PO TABS: 500.0000 mg | ORAL_TABLET | Freq: Two times a day (BID) | ORAL | 0 refills | Status: DC

## 2019-11-14 ENCOUNTER — Other Ambulatory Visit: Payer: Self-pay

## 2019-11-14 ENCOUNTER — Ambulatory Visit
Admission: RE | Admit: 2019-11-14 | Discharge: 2019-11-14 | Disposition: A | Discharge status: Home / Self Care | Payer: Medicare Other | Source: Ambulatory Visit | Attending: Radiation Oncology | Admitting: Radiation Oncology

## 2019-11-14 DIAGNOSIS — Z510 Encounter for antineoplastic radiation therapy: Secondary | ICD-10-CM | POA: Diagnosis not present

## 2019-11-15 ENCOUNTER — Ambulatory Visit: Payer: Medicare Other

## 2019-11-15 ENCOUNTER — Ambulatory Visit
Admission: RE | Admit: 2019-11-15 | Discharge: 2019-11-15 | Disposition: A | Discharge status: Home / Self Care | Payer: Medicare Other | Source: Ambulatory Visit | Attending: Radiation Oncology | Admitting: Radiation Oncology

## 2019-11-15 ENCOUNTER — Other Ambulatory Visit: Payer: Self-pay

## 2019-11-15 DIAGNOSIS — Z510 Encounter for antineoplastic radiation therapy: Secondary | ICD-10-CM | POA: Diagnosis not present

## 2019-11-16 ENCOUNTER — Other Ambulatory Visit: Payer: Self-pay

## 2019-11-16 ENCOUNTER — Ambulatory Visit
Admission: RE | Admit: 2019-11-16 | Discharge: 2019-11-16 | Disposition: A | Discharge status: Home / Self Care | Payer: Medicare Other | Source: Ambulatory Visit | Attending: Radiation Oncology | Admitting: Radiation Oncology

## 2019-11-16 DIAGNOSIS — Z510 Encounter for antineoplastic radiation therapy: Secondary | ICD-10-CM | POA: Diagnosis not present

## 2019-11-19 ENCOUNTER — Ambulatory Visit
Admission: RE | Admit: 2019-11-19 | Discharge: 2019-11-19 | Disposition: A | Discharge status: Home / Self Care | Payer: Medicare Other | Source: Ambulatory Visit | Attending: Radiation Oncology | Admitting: Radiation Oncology

## 2019-11-19 ENCOUNTER — Other Ambulatory Visit: Payer: Self-pay

## 2019-11-19 DIAGNOSIS — Z510 Encounter for antineoplastic radiation therapy: Secondary | ICD-10-CM | POA: Diagnosis not present

## 2019-11-20 ENCOUNTER — Ambulatory Visit: Payer: Medicare Other

## 2019-11-20 ENCOUNTER — Other Ambulatory Visit: Payer: Self-pay

## 2019-11-20 ENCOUNTER — Ambulatory Visit
Admission: RE | Admit: 2019-11-20 | Discharge: 2019-11-20 | Disposition: A | Discharge status: Home / Self Care | Payer: Medicare Other | Source: Ambulatory Visit | Attending: Radiation Oncology | Admitting: Radiation Oncology

## 2019-11-20 ENCOUNTER — Other Ambulatory Visit: Payer: Self-pay | Admitting: *Deleted

## 2019-11-20 DIAGNOSIS — Z510 Encounter for antineoplastic radiation therapy: Secondary | ICD-10-CM | POA: Diagnosis not present

## 2019-11-20 MED ORDER — SUCRALFATE 1 G PO TABS: 0.5000 g | ORAL_TABLET | Freq: Three times a day (TID) | ORAL | 1 refills | Status: DC

## 2019-11-21 ENCOUNTER — Ambulatory Visit: Admission: RE | Admit: 2019-11-21 | Payer: Medicare Other | Source: Ambulatory Visit

## 2019-11-21 ENCOUNTER — Other Ambulatory Visit: Payer: Self-pay

## 2019-11-21 DIAGNOSIS — Z510 Encounter for antineoplastic radiation therapy: Secondary | ICD-10-CM | POA: Diagnosis not present

## 2019-11-22 ENCOUNTER — Ambulatory Visit: Payer: Medicare Other

## 2019-11-22 ENCOUNTER — Ambulatory Visit
Admission: RE | Admit: 2019-11-22 | Discharge: 2019-11-22 | Disposition: A | Discharge status: Home / Self Care | Payer: Medicare Other | Source: Ambulatory Visit | Attending: Radiation Oncology | Admitting: Radiation Oncology

## 2019-11-22 ENCOUNTER — Other Ambulatory Visit: Payer: Self-pay

## 2019-11-22 DIAGNOSIS — Z510 Encounter for antineoplastic radiation therapy: Secondary | ICD-10-CM | POA: Diagnosis not present

## 2019-11-23 ENCOUNTER — Other Ambulatory Visit: Payer: Self-pay

## 2019-11-23 ENCOUNTER — Ambulatory Visit
Admission: RE | Admit: 2019-11-23 | Discharge: 2019-11-23 | Disposition: A | Discharge status: Home / Self Care | Payer: Medicare Other | Source: Ambulatory Visit | Attending: Radiation Oncology | Admitting: Radiation Oncology

## 2019-11-23 DIAGNOSIS — Z510 Encounter for antineoplastic radiation therapy: Secondary | ICD-10-CM | POA: Diagnosis not present

## 2019-11-26 ENCOUNTER — Other Ambulatory Visit: Payer: Self-pay

## 2019-11-26 ENCOUNTER — Ambulatory Visit
Admission: RE | Admit: 2019-11-26 | Discharge: 2019-11-26 | Disposition: A | Discharge status: Home / Self Care | Payer: Medicare Other | Source: Ambulatory Visit | Attending: Radiation Oncology | Admitting: Radiation Oncology

## 2019-11-26 DIAGNOSIS — C73 Malignant neoplasm of thyroid gland: Secondary | ICD-10-CM | POA: Diagnosis not present

## 2019-11-26 DIAGNOSIS — Z510 Encounter for antineoplastic radiation therapy: Secondary | ICD-10-CM | POA: Insufficient documentation

## 2019-11-27 ENCOUNTER — Ambulatory Visit: Payer: Medicare Other

## 2020-01-02 ENCOUNTER — Ambulatory Visit: Payer: Medicare Other | Admitting: Radiation Oncology

## 2020-01-22 ENCOUNTER — Other Ambulatory Visit: Payer: Self-pay

## 2020-01-22 DIAGNOSIS — C73 Malignant neoplasm of thyroid gland: Secondary | ICD-10-CM | POA: Insufficient documentation

## 2020-01-22 DIAGNOSIS — R0789 Other chest pain: Secondary | ICD-10-CM | POA: Insufficient documentation

## 2020-01-23 ENCOUNTER — Ambulatory Visit
Admission: RE | Admit: 2020-01-23 | Discharge: 2020-01-23 | Disposition: A | Discharge status: Home / Self Care | Payer: Medicare Other | Source: Ambulatory Visit | Attending: Radiation Oncology | Admitting: Radiation Oncology

## 2020-01-23 VITALS — BP 153/78 | HR 68 | Resp 18

## 2020-01-23 DIAGNOSIS — C779 Secondary and unspecified malignant neoplasm of lymph node, unspecified: Secondary | ICD-10-CM | POA: Diagnosis not present

## 2020-01-23 DIAGNOSIS — C73 Malignant neoplasm of thyroid gland: Secondary | ICD-10-CM | POA: Diagnosis not present

## 2020-01-23 DIAGNOSIS — C7800 Secondary malignant neoplasm of unspecified lung: Secondary | ICD-10-CM | POA: Insufficient documentation

## 2020-01-23 DIAGNOSIS — M79603 Pain in arm, unspecified: Secondary | ICD-10-CM | POA: Insufficient documentation

## 2020-01-23 DIAGNOSIS — Z923 Personal history of irradiation: Secondary | ICD-10-CM | POA: Insufficient documentation

## 2020-01-23 NOTE — Progress Notes (Signed)
Radiation Oncology Follow up Note  Name: Laura Walters   Date:   01/23/2020 MRN:  [id number] DOB: Aug 02, 1933    This 86 y.o. female presents to the clinic today for follow-up of SBRT to her left supraclavicular fossa for metastatic stage IV thyroid cancer  REFERRING PROVIDER: Idelle Crouch, MD  HPI: Patient is an 86 year old female who is 1 month out from SBRT in a palliative mode to her left supraclavicular fossa for metastatic thyroid cancer.  She has multiple pulmonary metastasis.  She is seen today in routine follow-up and is doing fairly well she is specifically denies dysphagia cough.  She is having significant arm pain although this precedes radiation therapy.  I have reviewed her former PET CT scan showing no evidence of disease in that region.  She has been receiving cortisone injections to that arm from orthopedic surgery.  COMPLICATIONS OF TREATMENT: none  FOLLOW UP COMPLIANCE: keeps appointments   PHYSICAL EXAM:  BP (!) 153/78 (BP Location: Left Arm, Patient Position: Sitting)   Pulse 68   Resp 18  Elderly frail-appearing female in NAD some visual residual hyperpigmentation of the skin is noted in the left supraclavicular fossa no evidence of mass or nodularity is appreciated.  Well-developed well-nourished patient in NAD. HEENT reveals PERLA, EOMI, discs not visualized.  Oral cavity is clear. No oral mucosal lesions are identified. Neck is clear without evidence of cervical or supraclavicular adenopathy. Lungs are clear to A&P. Cardiac examination is essentially unremarkable with regular rate and rhythm without murmur rub or thrill. Abdomen is benign with no organomegaly or masses noted. Motor sensory and DTR levels are equal and symmetric in the upper and lower extremities. Cranial nerves II through XII are grossly intact. Proprioception is intact. No peripheral adenopathy or edema is identified. No motor or sensory levels are noted. Crude visual fields are within normal  range.  RADIOLOGY RESULTS: No current films to review  PLAN: Present time patient is doing well she will see Dr. Mike Gip back in May and probably additional imaging will be obtained.  Overall she is tolerated her treatments well.  I have asked to see her back in 4 months for follow-up.  Patient and family know to call sooner with any concerns.  I would like to take this opportunity to thank you for allowing me to participate in the care of your patient.Noreene Filbert, MD

## 2020-01-29 ENCOUNTER — Other Ambulatory Visit: Payer: Self-pay | Admitting: Orthopedic Surgery

## 2020-01-29 DIAGNOSIS — M25512 Pain in left shoulder: Secondary | ICD-10-CM

## 2020-02-01 ENCOUNTER — Ambulatory Visit
Admission: RE | Admit: 2020-02-01 | Discharge: 2020-02-01 | Disposition: A | Discharge status: Home / Self Care | Payer: Medicare Other | Source: Ambulatory Visit | Attending: Orthopedic Surgery | Admitting: Orthopedic Surgery

## 2020-02-01 ENCOUNTER — Other Ambulatory Visit: Payer: Self-pay

## 2020-02-01 DIAGNOSIS — M7502 Adhesive capsulitis of left shoulder: Secondary | ICD-10-CM | POA: Insufficient documentation

## 2020-02-01 DIAGNOSIS — M75122 Complete rotator cuff tear or rupture of left shoulder, not specified as traumatic: Secondary | ICD-10-CM | POA: Insufficient documentation

## 2020-02-01 DIAGNOSIS — M25512 Pain in left shoulder: Secondary | ICD-10-CM | POA: Insufficient documentation

## 2020-02-01 DIAGNOSIS — R911 Solitary pulmonary nodule: Secondary | ICD-10-CM | POA: Insufficient documentation

## 2020-02-01 DIAGNOSIS — M19012 Primary osteoarthritis, left shoulder: Secondary | ICD-10-CM | POA: Insufficient documentation

## 2020-02-01 MED ORDER — SODIUM CHLORIDE (PF) 0.9 % IJ SOLN
7.0000 mL | Freq: Once | INTRAMUSCULAR | Status: AC
  Administered 2020-02-01: 13:00:00 7 mL

## 2020-02-01 MED ORDER — LIDOCAINE HCL (PF) 1 % IJ SOLN
5.0000 mL | Freq: Once | INTRAMUSCULAR | Status: AC
  Administered 2020-02-01: 5 mL
  Filled 2020-02-01: qty 5

## 2020-02-01 MED ORDER — IOHEXOL 180 MG/ML  SOLN
13.0000 mL | Freq: Once | INTRAMUSCULAR | Status: AC | PRN
  Administered 2020-02-01: 13 mL

## 2020-02-28 IMAGING — CT CT ANGIO NECK
2 of 7 series · 8 of 33 positions shown · IV contrast (APPLIED)
Comparison: PET-CT 08/07/2018. Soft tissue neck CT 12/05/2013.

CLINICAL DATA: Hypotension during a stress test. Near syncope.
History of thyroid cancer.

EXAM:
CT ANGIOGRAPHY NECK
TECHNIQUE: Multidetector CT imaging of the neck was performed using the
standard protocol during bolus administration of intravenous
contrast. Multiplanar CT image reconstructions and MIPs were
obtained to evaluate the vascular anatomy. Carotid stenosis
measurements (when applicable) are obtained utilizing NASCET
criteria, using the distal internal carotid diameter as the
denominator.
CONTRAST:  75mL OMNIPAQUE IOHEXOL 350 MG/ML SOLN

[Series 4: cta neck · axial · 0.40mm/px · z∈[+249,+311]mm · 2 of 95 slices shown]
[im 32/95  soft-tissue]
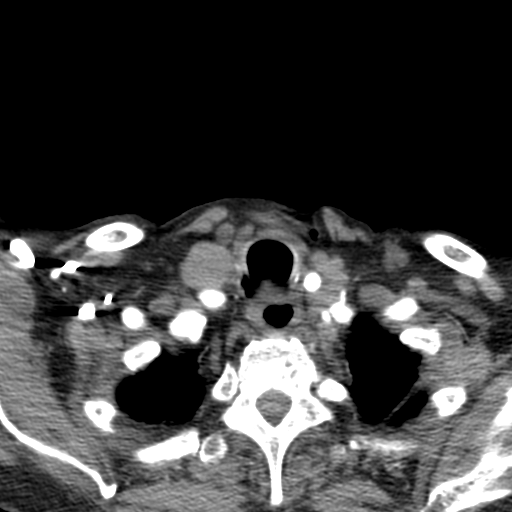
[im 63/95  soft-tissue]
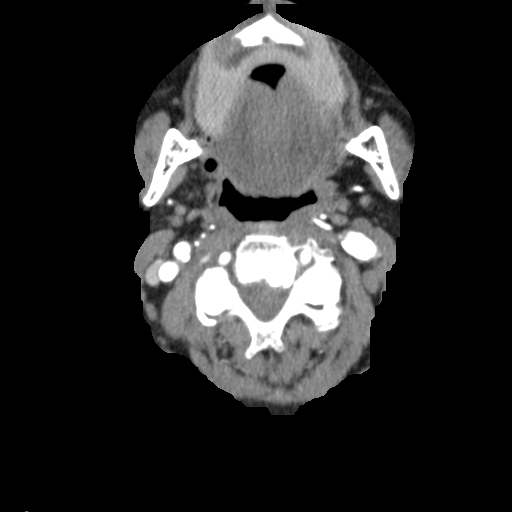

[Series 6: ax thin · axial · 0.33mm/px · z∈[+209,+342]mm · 6 of 187 slices shown]
[im 27/187  soft-tissue]
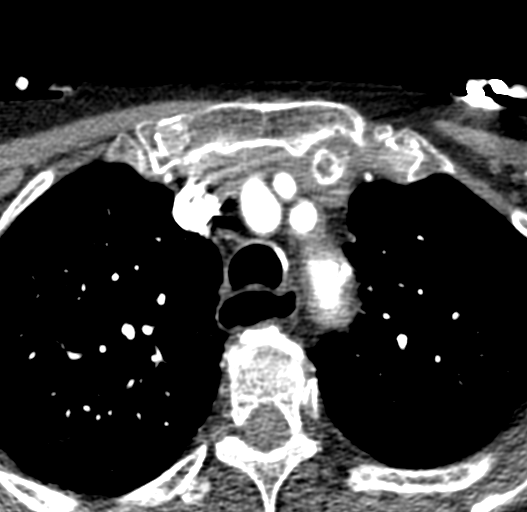
[im 54/187  bone]
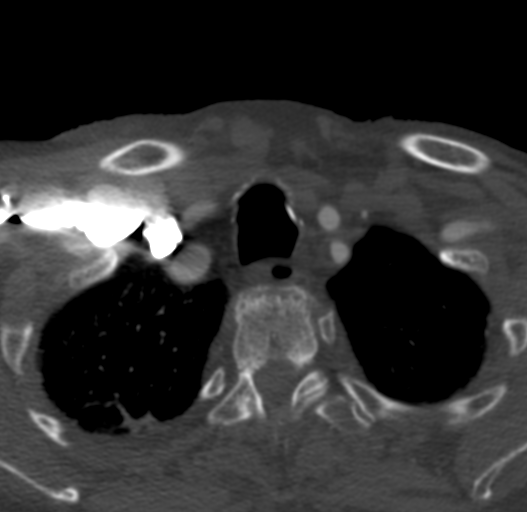
[im 80/187  soft-tissue]
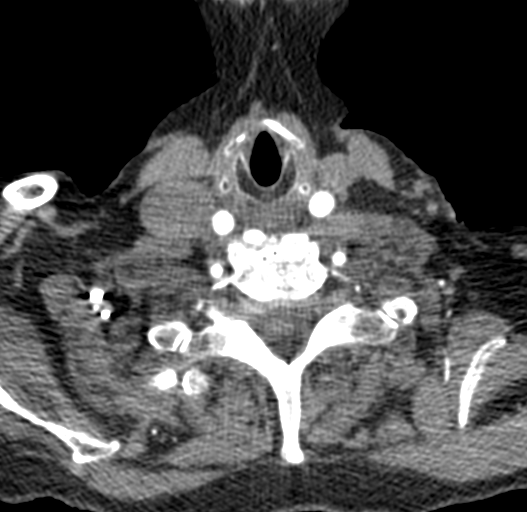
[im 107/187  bone]
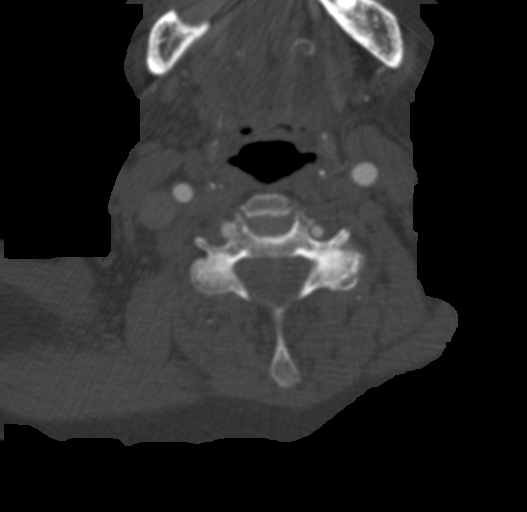
[im 133/187  soft-tissue]
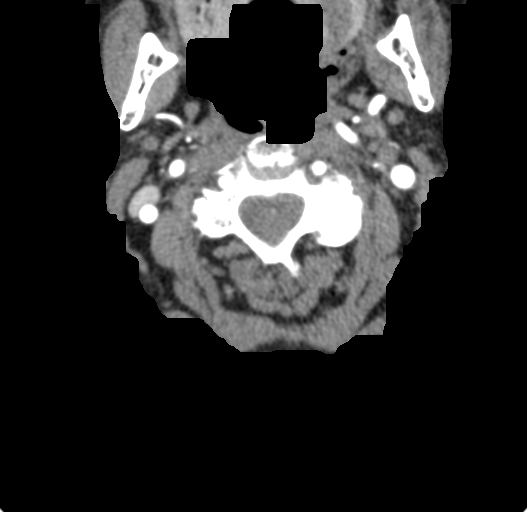
[im 160/187  bone]
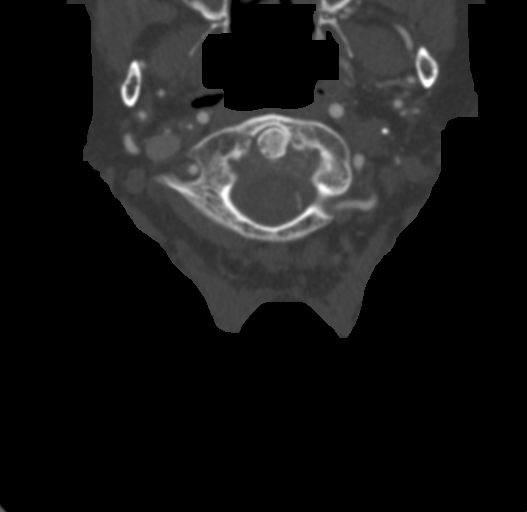

[8 of 33 positions shown; findings below may reference images not displayed]

FINDINGS: Aortic arch: Standard 3 vessel aortic arch with mild atherosclerotic
plaque. Widely patent arch vessel origins. Soft plaque throughout
the proximal left subclavian artery resulting in less than 50%
stenosis.

Right carotid system: Patent with mild plaque at the carotid
bifurcation. No evidence of stenosis or dissection.

Left carotid system: Patent with a focal, less than 50% stenosis of
the mid common carotid artery. Predominantly calcified plaque at the
carotid bifurcation not resulting in stenosis. No evidence of
dissection.

Vertebral arteries: Patent and codominant without evidence of flow
limiting stenosis or dissection. Tortuous proximal left V1 segments
with up to mild narrowing.

Skeleton: Advanced disc and facet degeneration in the cervical and
upper thoracic spine. Moderately advanced median C1-2 arthropathy.

Other neck: Fatty atrophy of the submandibular and parotid glands.
Status post thyroidectomy similar appearance of left lower neck
lymph nodes compared to the prior PET-CT. Partial imaging of known
sellar region mass involving the left cavernous sinus.

Upper chest: Mild biapical lung scarring.
IMPRESSION: 1. Mild atherosclerosis without significant cervical carotid or
vertebral artery stenosis or dissection.
2.  Aortic Atherosclerosis ([id number]).

## 2020-04-27 DEATH — deceased

## 2020-05-15 ENCOUNTER — Encounter: Payer: Self-pay | Admitting: Radiation Oncology

## 2020-05-26 ENCOUNTER — Ambulatory Visit: Payer: Medicare Other | Admitting: Radiation Oncology
